# Patient Record
Sex: Female | Born: 1935 | Race: White | Hispanic: No | Smoking: Never smoker
Health system: Southern US, Community
[De-identification: ages and names within clinical notes are randomized; demographics above are authoritative.]

## PROBLEM LIST (undated history)

## (undated) DIAGNOSIS — L570 Actinic keratosis: Secondary | ICD-10-CM

## (undated) DIAGNOSIS — F419 Anxiety disorder, unspecified: Secondary | ICD-10-CM

## (undated) DIAGNOSIS — I471 Supraventricular tachycardia, unspecified: Secondary | ICD-10-CM

## (undated) DIAGNOSIS — E785 Hyperlipidemia, unspecified: Secondary | ICD-10-CM

## (undated) DIAGNOSIS — R7611 Nonspecific reaction to tuberculin skin test without active tuberculosis: Secondary | ICD-10-CM

## (undated) DIAGNOSIS — I10 Essential (primary) hypertension: Secondary | ICD-10-CM

## (undated) DIAGNOSIS — S060XAA Concussion with loss of consciousness status unknown, initial encounter: Secondary | ICD-10-CM

## (undated) DIAGNOSIS — M179 Osteoarthritis of knee, unspecified: Secondary | ICD-10-CM

## (undated) DIAGNOSIS — R002 Palpitations: Secondary | ICD-10-CM

## (undated) DIAGNOSIS — B029 Zoster without complications: Secondary | ICD-10-CM

## (undated) DIAGNOSIS — F32A Depression, unspecified: Secondary | ICD-10-CM

## (undated) DIAGNOSIS — M8580 Other specified disorders of bone density and structure, unspecified site: Secondary | ICD-10-CM

## (undated) DIAGNOSIS — I639 Cerebral infarction, unspecified: Secondary | ICD-10-CM

## (undated) DIAGNOSIS — T7840XA Allergy, unspecified, initial encounter: Secondary | ICD-10-CM

## (undated) DIAGNOSIS — F329 Major depressive disorder, single episode, unspecified: Secondary | ICD-10-CM

## (undated) DIAGNOSIS — K219 Gastro-esophageal reflux disease without esophagitis: Secondary | ICD-10-CM

## (undated) DIAGNOSIS — S060X9A Concussion with loss of consciousness of unspecified duration, initial encounter: Secondary | ICD-10-CM

## (undated) DIAGNOSIS — M1710 Unilateral primary osteoarthritis, unspecified knee: Secondary | ICD-10-CM

## (undated) DIAGNOSIS — K589 Irritable bowel syndrome without diarrhea: Secondary | ICD-10-CM

## (undated) DIAGNOSIS — I499 Cardiac arrhythmia, unspecified: Secondary | ICD-10-CM

## (undated) DIAGNOSIS — J45909 Unspecified asthma, uncomplicated: Secondary | ICD-10-CM

## (undated) HISTORY — PX: CATARACT EXTRACTION: SUR2

## (undated) HISTORY — PX: HX HYSTERECTOMY: SHX81

## (undated) HISTORY — PX: HX GALL BLADDER SURGERY/CHOLE: SHX55

## (undated) HISTORY — DX: Actinic keratosis: L57.0

## (undated) HISTORY — DX: Unilateral primary osteoarthritis, unspecified knee: M17.10

## (undated) HISTORY — DX: Allergy, unspecified, initial encounter: T78.40XA

## (undated) HISTORY — DX: Concussion with loss of consciousness of unspecified duration, initial encounter: S06.0X9A

## (undated) HISTORY — DX: Osteoarthritis of knee, unspecified: M17.9

## (undated) HISTORY — DX: Palpitations: R00.2

## (undated) HISTORY — DX: Major depressive disorder, single episode, unspecified: F32.9

## (undated) HISTORY — DX: Irritable bowel syndrome, unspecified: K58.9

## (undated) HISTORY — PX: TOTAL ABDOMINAL HYSTERECTOMY: SHX209

## (undated) HISTORY — DX: Zoster without complications: B02.9

## (undated) HISTORY — DX: Supraventricular tachycardia: I47.1

## (undated) HISTORY — DX: Essential (primary) hypertension: I10

## (undated) HISTORY — DX: Hyperlipidemia, unspecified: E78.5

## (undated) HISTORY — DX: Supraventricular tachycardia, unspecified: I47.10

## (undated) HISTORY — DX: Nonspecific reaction to tuberculin skin test without active tuberculosis: R76.11

## (undated) HISTORY — DX: Anxiety disorder, unspecified: F41.9

## (undated) HISTORY — DX: Concussion with loss of consciousness status unknown, initial encounter: S06.0XAA

## (undated) HISTORY — DX: Depression, unspecified: F32.A

## (undated) HISTORY — PX: OTHER SURGICAL HISTORY: SHX169

## (undated) HISTORY — DX: Other specified disorders of bone density and structure, unspecified site: M85.80

## (undated) HISTORY — DX: Gastro-esophageal reflux disease without esophagitis: K21.9

---

## 1986-12-17 HISTORY — PX: BREAST EXCISIONAL BIOPSY: SUR124

## 1994-11-19 ENCOUNTER — Ambulatory Visit (HOSPITAL_COMMUNITY): Payer: Self-pay | Admitting: INTERNAL MEDICINE

## 1995-12-18 HISTORY — PX: GALLBLADDER SURGERY: SHX652

## 1996-04-04 ENCOUNTER — Other Ambulatory Visit: Payer: Self-pay

## 2002-12-17 HISTORY — PX: RECTOCELE REPAIR: SHX761

## 2003-11-03 ENCOUNTER — Other Ambulatory Visit: Payer: Self-pay

## 2007-07-01 ENCOUNTER — Ambulatory Visit: Payer: Self-pay | Admitting: Anesthesiology

## 2007-07-23 ENCOUNTER — Ambulatory Visit: Payer: Self-pay | Admitting: Pain Medicine

## 2007-07-29 ENCOUNTER — Ambulatory Visit: Payer: Self-pay | Admitting: Pain Medicine

## 2007-08-13 ENCOUNTER — Ambulatory Visit: Payer: Self-pay | Admitting: Pain Medicine

## 2007-08-14 ENCOUNTER — Ambulatory Visit: Payer: Self-pay | Admitting: Pain Medicine

## 2007-08-28 ENCOUNTER — Ambulatory Visit: Payer: Self-pay | Admitting: Pain Medicine

## 2007-09-24 ENCOUNTER — Ambulatory Visit: Payer: Self-pay | Admitting: Physician Assistant

## 2007-11-05 ENCOUNTER — Encounter: Payer: Self-pay | Admitting: Family Medicine

## 2008-01-18 HISTORY — PX: CARDIAC CATHETERIZATION: SHX172

## 2008-01-29 ENCOUNTER — Ambulatory Visit: Payer: Self-pay | Admitting: Cardiovascular Disease

## 2008-04-20 ENCOUNTER — Other Ambulatory Visit: Payer: Self-pay

## 2008-04-22 ENCOUNTER — Ambulatory Visit: Payer: Self-pay | Admitting: Physician Assistant

## 2008-05-19 ENCOUNTER — Ambulatory Visit: Payer: Self-pay | Admitting: Physician Assistant

## 2008-09-16 ENCOUNTER — Ambulatory Visit: Payer: Self-pay | Admitting: Physician Assistant

## 2009-02-23 ENCOUNTER — Ambulatory Visit: Payer: Self-pay | Admitting: Family Medicine

## 2009-02-23 DIAGNOSIS — J45909 Unspecified asthma, uncomplicated: Secondary | ICD-10-CM | POA: Insufficient documentation

## 2009-02-23 DIAGNOSIS — F411 Generalized anxiety disorder: Secondary | ICD-10-CM | POA: Insufficient documentation

## 2009-02-23 DIAGNOSIS — I10 Essential (primary) hypertension: Secondary | ICD-10-CM | POA: Insufficient documentation

## 2009-02-23 DIAGNOSIS — J309 Allergic rhinitis, unspecified: Secondary | ICD-10-CM | POA: Insufficient documentation

## 2009-02-23 DIAGNOSIS — E785 Hyperlipidemia, unspecified: Secondary | ICD-10-CM | POA: Insufficient documentation

## 2009-02-23 DIAGNOSIS — B0229 Other postherpetic nervous system involvement: Secondary | ICD-10-CM | POA: Insufficient documentation

## 2009-02-23 DIAGNOSIS — K219 Gastro-esophageal reflux disease without esophagitis: Secondary | ICD-10-CM | POA: Insufficient documentation

## 2009-03-16 ENCOUNTER — Ambulatory Visit: Payer: Self-pay | Admitting: Family Medicine

## 2009-03-16 DIAGNOSIS — F32A Depression, unspecified: Secondary | ICD-10-CM | POA: Insufficient documentation

## 2009-03-16 DIAGNOSIS — F329 Major depressive disorder, single episode, unspecified: Secondary | ICD-10-CM

## 2009-04-19 ENCOUNTER — Telehealth: Payer: Self-pay | Admitting: Family Medicine

## 2009-04-25 ENCOUNTER — Telehealth: Payer: Self-pay | Admitting: Family Medicine

## 2009-05-13 ENCOUNTER — Telehealth: Payer: Self-pay | Admitting: Family Medicine

## 2009-05-19 ENCOUNTER — Encounter (INDEPENDENT_AMBULATORY_CARE_PROVIDER_SITE_OTHER): Payer: Self-pay | Admitting: *Deleted

## 2009-05-23 ENCOUNTER — Telehealth: Payer: Self-pay | Admitting: Family Medicine

## 2009-05-26 ENCOUNTER — Ambulatory Visit: Payer: Self-pay | Admitting: Family Medicine

## 2009-05-27 ENCOUNTER — Encounter: Payer: Self-pay | Admitting: Family Medicine

## 2009-05-29 LAB — CONVERTED CEMR LAB
ALT: 19 units/L (ref 0–35)
AST: 27 units/L (ref 0–37)
Albumin: 3.9 g/dL (ref 3.5–5.2)
BUN: 15 mg/dL (ref 6–23)
Basophils Absolute: 0 10*3/uL (ref 0.0–0.1)
Basophils Relative: 0.1 % (ref 0.0–3.0)
CO2: 32 meq/L (ref 19–32)
Calcium: 9.5 mg/dL (ref 8.4–10.5)
Chloride: 107 meq/L (ref 96–112)
Cholesterol: 247 mg/dL — ABNORMAL HIGH (ref 0–200)
Creatinine, Ser: 0.9 mg/dL (ref 0.4–1.2)
Direct LDL: 172.1 mg/dL
Eosinophils Absolute: 0.2 10*3/uL (ref 0.0–0.7)
Eosinophils Relative: 3.3 % (ref 0.0–5.0)
Glucose, Bld: 92 mg/dL (ref 70–99)
HCT: 42.1 % (ref 36.0–46.0)
HDL: 63.3 mg/dL (ref >39.00)
Hemoglobin: 14.4 g/dL (ref 12.0–15.0)
Lymphocytes Relative: 43.9 % (ref 12.0–46.0)
Lymphs Abs: 1.9 10*3/uL (ref 0.7–4.0)
MCHC: 34.2 g/dL (ref 30.0–36.0)
MCV: 90.9 fL (ref 78.0–100.0)
Monocytes Absolute: 0.6 10*3/uL (ref 0.1–1.0)
Monocytes Relative: 12.3 % — ABNORMAL HIGH (ref 3.0–12.0)
Neutro Abs: 1.9 10*3/uL (ref 1.4–7.7)
Neutrophils Relative %: 40.4 % — ABNORMAL LOW (ref 43.0–77.0)
Phosphorus: 3.6 mg/dL (ref 2.3–4.6)
Platelets: 237 10*3/uL (ref 150.0–400.0)
Potassium: 4.6 meq/L (ref 3.5–5.1)
RBC: 4.63 M/uL (ref 3.87–5.11)
RDW: 12.1 % (ref 11.5–14.6)
Sodium: 144 meq/L (ref 135–145)
TSH: 2.98 microintl units/mL (ref 0.35–5.50)
Total CHOL/HDL Ratio: 4
Triglycerides: 106 mg/dL (ref 0.0–149.0)
VLDL: 21.2 mg/dL (ref 0.0–40.0)
WBC: 4.6 10*3/uL (ref 4.5–10.5)

## 2009-05-30 ENCOUNTER — Ambulatory Visit: Payer: Self-pay | Admitting: Family Medicine

## 2009-06-01 LAB — CONVERTED CEMR LAB: Vit D, 25-Hydroxy: 47 ng/mL (ref 30–89)

## 2009-07-07 ENCOUNTER — Ambulatory Visit: Payer: Self-pay | Admitting: Family Medicine

## 2009-07-07 ENCOUNTER — Telehealth: Payer: Self-pay | Admitting: Family Medicine

## 2009-07-11 LAB — CONVERTED CEMR LAB
ALT: 16 units/L (ref 0–35)
AST: 24 units/L (ref 0–37)
Cholesterol: 169 mg/dL (ref 0–200)
HDL: 57.6 mg/dL (ref >39.00)
LDL Cholesterol: 81 mg/dL (ref 0–99)
Total CHOL/HDL Ratio: 3
Triglycerides: 150 mg/dL — ABNORMAL HIGH (ref 0.0–149.0)
VLDL: 30 mg/dL (ref 0.0–40.0)

## 2009-08-04 ENCOUNTER — Telehealth: Payer: Self-pay | Admitting: Family Medicine

## 2009-08-10 ENCOUNTER — Telehealth: Payer: Self-pay | Admitting: Family Medicine

## 2009-09-12 ENCOUNTER — Telehealth: Payer: Self-pay | Admitting: Family Medicine

## 2009-09-14 ENCOUNTER — Telehealth: Payer: Self-pay | Admitting: Family Medicine

## 2009-09-16 ENCOUNTER — Telehealth: Payer: Self-pay | Admitting: Family Medicine

## 2009-09-20 ENCOUNTER — Encounter: Payer: Self-pay | Admitting: Family Medicine

## 2009-09-20 ENCOUNTER — Telehealth: Payer: Self-pay | Admitting: Family Medicine

## 2009-09-26 ENCOUNTER — Telehealth: Payer: Self-pay | Admitting: Family Medicine

## 2009-09-27 ENCOUNTER — Encounter: Payer: Self-pay | Admitting: Family Medicine

## 2009-10-03 ENCOUNTER — Ambulatory Visit: Payer: Self-pay | Admitting: Family Medicine

## 2009-10-04 LAB — CONVERTED CEMR LAB
ALT: 15 units/L (ref 0–35)
AST: 19 units/L (ref 0–37)
Albumin: 3.9 g/dL (ref 3.5–5.2)
BUN: 21 mg/dL (ref 6–23)
CO2: 32 meq/L (ref 19–32)
Calcium: 9.1 mg/dL (ref 8.4–10.5)
Chloride: 101 meq/L (ref 96–112)
Cholesterol: 281 mg/dL — ABNORMAL HIGH (ref 0–200)
Creatinine, Ser: 0.9 mg/dL (ref 0.4–1.2)
Direct LDL: 203.9 mg/dL
GFR calc non Af Amer: 65.26 mL/min (ref >60)
Glucose, Bld: 87 mg/dL (ref 70–99)
HDL: 60.7 mg/dL (ref >39.00)
Phosphorus: 3.6 mg/dL (ref 2.3–4.6)
Potassium: 4.2 meq/L (ref 3.5–5.1)
Sodium: 142 meq/L (ref 135–145)
Total CHOL/HDL Ratio: 5
Triglycerides: 142 mg/dL (ref 0.0–149.0)
VLDL: 28.4 mg/dL (ref 0.0–40.0)

## 2009-10-20 ENCOUNTER — Telehealth: Payer: Self-pay | Admitting: Family Medicine

## 2009-11-22 ENCOUNTER — Telehealth: Payer: Self-pay | Admitting: Family Medicine

## 2009-11-30 ENCOUNTER — Ambulatory Visit: Payer: Self-pay | Admitting: Family Medicine

## 2009-12-21 ENCOUNTER — Telehealth: Payer: Self-pay | Admitting: Family Medicine

## 2009-12-22 ENCOUNTER — Encounter: Payer: Self-pay | Admitting: Family Medicine

## 2010-01-05 ENCOUNTER — Ambulatory Visit: Payer: Self-pay | Admitting: Family Medicine

## 2010-01-05 ENCOUNTER — Encounter (INDEPENDENT_AMBULATORY_CARE_PROVIDER_SITE_OTHER): Payer: Self-pay | Admitting: *Deleted

## 2010-01-06 LAB — CONVERTED CEMR LAB
BUN: 11 mg/dL (ref 6–23)
Basophils Absolute: 0 10*3/uL (ref 0.0–0.1)
Basophils Relative: 0.2 % (ref 0.0–3.0)
CO2: 29 meq/L (ref 19–32)
Calcium: 9.4 mg/dL (ref 8.4–10.5)
Chloride: 105 meq/L (ref 96–112)
Cholesterol: 181 mg/dL (ref 0–200)
Creatinine, Ser: 1 mg/dL (ref 0.4–1.2)
Eosinophils Absolute: 0.1 10*3/uL (ref 0.0–0.7)
Eosinophils Relative: 3.3 % (ref 0.0–5.0)
GFR calc non Af Amer: 57.75 mL/min (ref >60)
Glucose, Bld: 89 mg/dL (ref 70–99)
HCT: 44.5 % (ref 36.0–46.0)
HDL: 58.5 mg/dL (ref >39.00)
Hemoglobin: 14.5 g/dL (ref 12.0–15.0)
LDL Cholesterol: 107 mg/dL — ABNORMAL HIGH (ref 0–99)
Lymphocytes Relative: 37.5 % (ref 12.0–46.0)
Lymphs Abs: 1.6 10*3/uL (ref 0.7–4.0)
MCHC: 32.5 g/dL (ref 30.0–36.0)
MCV: 93 fL (ref 78.0–100.0)
Monocytes Absolute: 0.5 10*3/uL (ref 0.1–1.0)
Monocytes Relative: 11.9 % (ref 3.0–12.0)
Neutro Abs: 2 10*3/uL (ref 1.4–7.7)
Neutrophils Relative %: 47.1 % (ref 43.0–77.0)
Platelets: 225 10*3/uL (ref 150.0–400.0)
Potassium: 4.4 meq/L (ref 3.5–5.1)
RBC: 4.79 M/uL (ref 3.87–5.11)
RDW: 12.6 % (ref 11.5–14.6)
Sodium: 141 meq/L (ref 135–145)
Total CHOL/HDL Ratio: 3
Triglycerides: 76 mg/dL (ref 0.0–149.0)
VLDL: 15.2 mg/dL (ref 0.0–40.0)
WBC: 4.2 10*3/uL — ABNORMAL LOW (ref 4.5–10.5)

## 2010-01-23 ENCOUNTER — Telehealth: Payer: Self-pay | Admitting: Family Medicine

## 2010-02-03 ENCOUNTER — Encounter: Payer: Self-pay | Admitting: Family Medicine

## 2010-02-21 ENCOUNTER — Telehealth: Payer: Self-pay | Admitting: Family Medicine

## 2010-03-02 ENCOUNTER — Encounter: Payer: Self-pay | Admitting: Family Medicine

## 2010-03-02 ENCOUNTER — Ambulatory Visit: Payer: Self-pay | Admitting: Gastroenterology

## 2010-03-17 ENCOUNTER — Telehealth: Payer: Self-pay | Admitting: Family Medicine

## 2010-03-23 ENCOUNTER — Telehealth: Payer: Self-pay | Admitting: Family Medicine

## 2010-04-14 ENCOUNTER — Telehealth: Payer: Self-pay | Admitting: Family Medicine

## 2010-04-17 ENCOUNTER — Encounter: Payer: Self-pay | Admitting: Family Medicine

## 2010-04-18 ENCOUNTER — Ambulatory Visit: Payer: Self-pay | Admitting: Family Medicine

## 2010-04-18 DIAGNOSIS — R002 Palpitations: Secondary | ICD-10-CM | POA: Insufficient documentation

## 2010-04-24 LAB — CONVERTED CEMR LAB
ALT: 15 units/L (ref 0–35)
AST: 23 units/L (ref 0–37)
Albumin: 4 g/dL (ref 3.5–5.2)
BUN: 15 mg/dL (ref 6–23)
Basophils Absolute: 0.1 10*3/uL (ref 0.0–0.1)
Basophils Relative: 1 % (ref 0.0–3.0)
CO2: 31 meq/L (ref 19–32)
Calcium: 9.6 mg/dL (ref 8.4–10.5)
Chloride: 103 meq/L (ref 96–112)
Cholesterol: 275 mg/dL — ABNORMAL HIGH (ref 0–200)
Creatinine, Ser: 0.8 mg/dL (ref 0.4–1.2)
Direct LDL: 198.6 mg/dL
Eosinophils Absolute: 0.2 10*3/uL (ref 0.0–0.7)
Eosinophils Relative: 4.2 % (ref 0.0–5.0)
GFR calc non Af Amer: 74.65 mL/min (ref >60)
Glucose, Bld: 94 mg/dL (ref 70–99)
HCT: 41.6 % (ref 36.0–46.0)
HDL: 62.1 mg/dL (ref >39.00)
Hemoglobin: 14.1 g/dL (ref 12.0–15.0)
Lymphocytes Relative: 34.6 % (ref 12.0–46.0)
Lymphs Abs: 1.9 10*3/uL (ref 0.7–4.0)
MCHC: 33.8 g/dL (ref 30.0–36.0)
MCV: 91 fL (ref 78.0–100.0)
Monocytes Absolute: 0.5 10*3/uL (ref 0.1–1.0)
Monocytes Relative: 9 % (ref 3.0–12.0)
Neutro Abs: 2.8 10*3/uL (ref 1.4–7.7)
Neutrophils Relative %: 51.2 % (ref 43.0–77.0)
Phosphorus: 4.1 mg/dL (ref 2.3–4.6)
Platelets: 248 10*3/uL (ref 150.0–400.0)
Potassium: 4.6 meq/L (ref 3.5–5.1)
RBC: 4.58 M/uL (ref 3.87–5.11)
RDW: 13.2 % (ref 11.5–14.6)
Sodium: 141 meq/L (ref 135–145)
TSH: 1.95 microintl units/mL (ref 0.35–5.50)
Total CHOL/HDL Ratio: 4
Triglycerides: 102 mg/dL (ref 0.0–149.0)
VLDL: 20.4 mg/dL (ref 0.0–40.0)
WBC: 5.4 10*3/uL (ref 4.5–10.5)

## 2010-05-08 ENCOUNTER — Ambulatory Visit: Payer: Self-pay | Admitting: Family Medicine

## 2010-05-10 ENCOUNTER — Encounter: Payer: Self-pay | Admitting: Family Medicine

## 2010-05-11 LAB — CONVERTED CEMR LAB
BUN: 18 mg/dL (ref 6–23)
Basophils Absolute: 0.1 10*3/uL (ref 0.0–0.1)
Basophils Relative: 0.9 % (ref 0.0–3.0)
CO2: 29 meq/L (ref 19–32)
Calcium: 9 mg/dL (ref 8.4–10.5)
Chloride: 105 meq/L (ref 96–112)
Creatinine, Ser: 0.9 mg/dL (ref 0.4–1.2)
Eosinophils Absolute: 0.3 10*3/uL (ref 0.0–0.7)
Eosinophils Relative: 5.1 % — ABNORMAL HIGH (ref 0.0–5.0)
GFR calc non Af Amer: 64.33 mL/min (ref >60)
Glucose, Bld: 90 mg/dL (ref 70–99)
HCT: 41.4 % (ref 36.0–46.0)
Hemoglobin: 14 g/dL (ref 12.0–15.0)
Lymphocytes Relative: 38.8 % (ref 12.0–46.0)
Lymphs Abs: 2.3 10*3/uL (ref 0.7–4.0)
MCHC: 33.8 g/dL (ref 30.0–36.0)
MCV: 91.2 fL (ref 78.0–100.0)
Monocytes Absolute: 0.6 10*3/uL (ref 0.1–1.0)
Monocytes Relative: 10.7 % (ref 3.0–12.0)
Neutro Abs: 2.6 10*3/uL (ref 1.4–7.7)
Neutrophils Relative %: 44.5 % (ref 43.0–77.0)
Platelets: 241 10*3/uL (ref 150.0–400.0)
Potassium: 4.3 meq/L (ref 3.5–5.1)
RBC: 4.54 M/uL (ref 3.87–5.11)
RDW: 13.2 % (ref 11.5–14.6)
Sodium: 141 meq/L (ref 135–145)
WBC: 5.9 10*3/uL (ref 4.5–10.5)

## 2010-06-21 ENCOUNTER — Telehealth: Payer: Self-pay | Admitting: Family Medicine

## 2010-07-19 ENCOUNTER — Telehealth: Payer: Self-pay | Admitting: Family Medicine

## 2010-08-03 ENCOUNTER — Ambulatory Visit: Payer: Self-pay | Admitting: Family Medicine

## 2010-08-14 ENCOUNTER — Telehealth: Payer: Self-pay | Admitting: Family Medicine

## 2010-08-28 ENCOUNTER — Ambulatory Visit: Payer: Self-pay | Admitting: Family Medicine

## 2010-09-04 ENCOUNTER — Encounter: Payer: Self-pay | Admitting: Family Medicine

## 2010-09-06 ENCOUNTER — Ambulatory Visit: Payer: Self-pay | Admitting: Family Medicine

## 2010-09-06 DIAGNOSIS — M810 Age-related osteoporosis without current pathological fracture: Secondary | ICD-10-CM | POA: Insufficient documentation

## 2010-09-06 LAB — CONVERTED CEMR LAB
Blood in Urine, dipstick: NEGATIVE
Glucose, Urine, Semiquant: NEGATIVE
Ketones, urine, test strip: NEGATIVE
Urine crystals, microscopic: 0 /hpf
Urobilinogen, UA: 0.2
pH: 6

## 2010-09-07 ENCOUNTER — Encounter: Payer: Self-pay | Admitting: Family Medicine

## 2010-09-11 ENCOUNTER — Ambulatory Visit: Payer: Self-pay | Admitting: Family Medicine

## 2010-09-28 ENCOUNTER — Telehealth (INDEPENDENT_AMBULATORY_CARE_PROVIDER_SITE_OTHER): Payer: Self-pay | Admitting: *Deleted

## 2010-09-29 ENCOUNTER — Ambulatory Visit: Payer: Self-pay | Admitting: Family Medicine

## 2010-10-13 ENCOUNTER — Telehealth: Payer: Self-pay | Admitting: Family Medicine

## 2010-10-20 ENCOUNTER — Ambulatory Visit: Payer: Self-pay | Admitting: Family Medicine

## 2010-10-20 DIAGNOSIS — J069 Acute upper respiratory infection, unspecified: Secondary | ICD-10-CM | POA: Insufficient documentation

## 2010-11-14 ENCOUNTER — Ambulatory Visit: Payer: Self-pay | Admitting: Family Medicine

## 2010-11-14 ENCOUNTER — Encounter: Payer: Self-pay | Admitting: Family Medicine

## 2010-11-16 ENCOUNTER — Encounter: Payer: Self-pay | Admitting: Family Medicine

## 2010-12-13 ENCOUNTER — Telehealth: Payer: Self-pay | Admitting: Family Medicine

## 2011-01-09 ENCOUNTER — Ambulatory Visit
Admission: RE | Admit: 2011-01-09 | Discharge: 2011-01-09 | Payer: Self-pay | Source: Home / Self Care | Attending: Family Medicine | Admitting: Family Medicine

## 2011-01-09 DIAGNOSIS — G5600 Carpal tunnel syndrome, unspecified upper limb: Secondary | ICD-10-CM | POA: Insufficient documentation

## 2011-01-09 DIAGNOSIS — L989 Disorder of the skin and subcutaneous tissue, unspecified: Secondary | ICD-10-CM | POA: Insufficient documentation

## 2011-01-16 NOTE — Assessment & Plan Note (Signed)
Summary: L SHOULDER PAIN/CLE   Vital Signs:  Patient profile:   77 year old female Height:      62 inches Weight:      135.0 pounds BMI:     24.78 Temp:     98.3 degrees F oral Pulse rate:   80 / minute Pulse rhythm:   regular BP sitting:   120 / 70  (left arm) Cuff size:   regular  Vitals Entered By: Benny Lennert CMA Duncan Dull) (September 11, 2010 12:38 PM)  History of Present Illness: Chief complaint Left shoulder pain  let shoulder, followup, probable partial thickness cuff injury on the left. She continues to have some significant amount of pain, some pain with an arc of motion, but her range of motion though minimally restricted is been relatively preserved. She has been doing her home exercise program and range of motion, does cause some pain. Still has a dull aching pain on the lateral side of her shoulder.  Additionally, she brings up some chronic intermittent sciatica on the right side. Pain radiating down the posterior aspect of her right leg. No trauma or accident in this regard. No lateral hip intertrochanteric pain. Buttocks pain.  08/03/2010 OV 77 year old female:  pleasant patient who was reaching around with her left shoulder behind her approximately 2 weeks or so ago, and suddenly felt a significant amount of pain.  Since that time she has had pain in abduction and internal rotation.  She does have a history of some shoulder injury approximately 20 years ago or more ago, however she never sought medical attention, and so unclear of exact diagnosis. No known history of traumatic fracture operative intervention.  She also brings up a history of some median nerve distribution paresthesias.  Review of systems as above. Able to eat drink, no nauseous or vomits  Allergies: 1)  ! Augmentin 2)  ! * Tape 3)  ! Lipitor (Atorvastatin) 4)  ! Neurontin 5)  ! * Albuterol 6)  ! Crestor 7)  ! Vytorin 8)  ! Fosamax 9)  ! Actonel 10)  ! Boniva 11)  ! * Diltiazem 12)  !  Sulfa  Past History:  Past medical, surgical, family and social histories (including risk factors) reviewed, and no changes noted (except as noted below).  Past Medical History: Reviewed history from 09/06/2010 and no changes required. 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 Gallbladder 1997 Breast Biopsy 1988 Hysterectomy sept 1995 (for bleeding , not cancer ) Rectocele 2004 colonosc 1/06- was nl , no polyps  EGD- in past , nl  cardiac work up with cath (mild CAD) dexa 11/08 OP colonosc 3/11 nl -- re check 5 y  cardiologist -- [place]   Family History: Reviewed history from 08/28/2010 and no changes required. Family History of Arthritis (parent, grandparent, other relative) Family History Breast cancer 1st degree relative <50 (mother, other blood relative) Family History Hypertension(parent, grandparent, other relative) Family History of Heart disease(parent, grandparent, other relative) Family History of Colon CA (other relative)- sister  Family History of Stroke (other relative)  daughter with thyroid ca, and ? lymphoma   daughter  is Doneen Poisson - pt here   sister colon ca  daughter with anxiety  Social History: Reviewed history from 05/30/2009 and no changes required. Retired- homemaker  Married- cares for husband who has heart dz  Never Smoked Alcohol use-no Drug use-no Regular exercise-no G3P3 moved here from [facility] - 2 years ago to be closer to family  Review of Systems       REVIEW OF SYSTEMS  GEN: No systemic complaints, no  fevers, chills, sweats, or other acute illnesses MSK: Detailed in the HPI GI: tolerating PO intake without difficulty Neuro: as above Otherwise the pertinent positives of the ROS are noted above.    Physical Exam  General:  GEN: Well-developed,well-nourished,in no acute distress; alert,appropriate and cooperative throughout examination HEENT: Normocephalic and atraumatic without obvious abnormalities. No apparent alopecia or balding. Ears, externally no deformities PULM: Breathing comfortably in no respiratory distress EXT: No clubbing, cyanosis, or edema PSYCH: Normally interactive. Cooperative during the interview. Pleasant. Friendly and conversant. Not anxious or depressed appearing. Normal, full affect.  Msk:  Shoulder: L Inspection: No muscle wasting or winging Ecchymosis/edema: neg  AC joint, scapula, clavicle: NT Cervical spine: NT, full ROM Spurling's: neg Abduction: full, 4+/5, painful arc of motion. Flexion: full, 5/5 IR, full, lift-off: 5/5 ER at neutral: full, 5/5 AC crossover: neg Neer: pos Hawkins: pos Drop Test: negcauses some pain, but able to achieve a full control easily on downward arc Empty Can: pos Supraspinatus insertion: mild-mod T Bicipital groove: NT Speed's: neg Yergason's: neg Sulcus sign: neg Scapular dyskinesis: some elevation in the scapula onabduction C5-T1 intact  Neuro: Sensation intact Grip 5/5  Additional Exam:   HIP EXAM: SIDE: R ROM: Abduction, Flexion, Internal and External range of motion: notably restricted with internal/external rotation on the right. Pain with terminal IROM and EROM: yes GTB: NT SLR: NEG Knees: No effusion FABER: NT REVERSE FABER: positive Piriformis: tender at direct palpation Str: flexion: 4-/5 abduction: 4-/5 on the right, 4+/5 on the left adduction: 5/5 Strength testing non-tender    Impression & Recommendations:  Problem # 1:  ROTATOR CUFF INJURY, LEFT SHOULDER (ICD-726.10) continued symptoms.  Would anticipate it was could take 3-5 months for this to heal and in conservative fashion. I don't think it's fully torn.  This point, given her pain, think it's appropriate to do a subacromial Kenalog injection.  SubAC Injection Verbal consent was obtained from the patient. Risks, benefits, and alternatives were explained. Patient prepped with Betadine and Ethyl Chloride used for anesthesia. The subacromial space was injected using the posterior approach. The patient tolerated the procedure well and had decreased pain post injection. No complications. Injection: 9 cc of Lidocaine 1% and 1cc of Kenalog 40 mg. Needle: 22 gauge   Orders: Physical Therapy Referral (PT) Joint Aspirate / Injection, Large (20610) Kenalog 10mg  (4units) (J3301)  Problem # 2:  SCIATICA, ACUTE (ICD-724.3) Assessment: New Exam and history consistent with sciatica  Start ROM protocols, direct massage of piriformis, HIP ROM, core stabily, pelvic stability PT  The following medications were removed from the medication list:    Tramadol Hcl 50 Mg Tabs (Tramadol hcl) .Marland Kitchen... 1 by mouth 4 times daily Her updated medication list for this problem includes:    Hydrocodone-acetaminophen 7.5-325 Mg Tabs (Hydrocodone-acetaminophen) ..... One by mouth every 6 hours as needed pain  Orders: Physical Therapy Referral (PT)  Complete Medication List: 1)  Caltrate 600+d Plus 600-400 Mg-unit Tabs (Calcium carbonate-vit d-min) .... Take 1 tablet by mouth two times a day 2)  Hydrocodone-acetaminophen 7.5-325 Mg Tabs (Hydrocodone-acetaminophen) .... One by mouth every 6 hours as needed pain 3)  Lorazepam 1 Mg Tabs (Lorazepam) .... Take 1 tablet by mouth once a day as needed 4)  Omeprazole 40 Mg Cpdr (Omeprazole) .... Take 1 tablet by mouth once a day 5)  Singulair 10 Mg Tabs (Montelukast sodium) .... Take 1 tablet by mouth once a day as needed 6)  Gas-x Extra Strength 125 Mg Caps (Simethicone) .... Otc as directed  Patient  Instructions: 1)  Referral Appointment Information 2)  Day/Date: 3)  Time: 4)  Place/MD: 5)  Address: 6)  Phone/Fax: 7)  Patient given appointment information. Information/Orders faxed/mailed.  8)  f/u 6 weeks  Current Allergies (reviewed today): ! AUGMENTIN ! * TAPE ! LIPITOR (ATORVASTATIN) ! NEURONTIN ! * ALBUTEROL ! CRESTOR ! VYTORIN ! FOSAMAX ! ACTONEL ! BONIVA ! * DILTIAZEM ! SULFA

## 2011-01-16 NOTE — Assessment & Plan Note (Signed)
Summary: ?UTI/CLE   Vital Signs:  Patient profile:   77 year old female Weight:      134 pounds Temp:     97.9 degrees F oral Pulse rate:   80 / minute Pulse rhythm:   irregular BP sitting:   150 / 100  (left arm) Cuff size:   regular  Vitals Entered By: Sydell Axon LPN (September 06, 2010 12:23 PM) CC: ? UTI, burning with urination, frequency and pressure   History of Present Illness: a lot of bladder pain and frequency of urination  also burns to urinate too drinking cranberrry and lots of water  no fever or nausea  some low back pain - both sides  recent dexa osteopenia  T score -1.6 in LS -- down a bit  T score -2.3 in FN - that is quite a bit improved   takes caltrate two times a day and stays active  non tol the bisphophenates  we reviewed the scan results in detail today    had a rectocele repair 6 years ago -- some degree of pain ever since   Allergies: 1)  ! Augmentin 2)  ! * Tape 3)  ! Lipitor (Atorvastatin) 4)  ! Neurontin 5)  ! * Albuterol 6)  ! Crestor 7)  ! Vytorin 8)  ! Fosamax 9)  ! Actonel 10)  ! Boniva 11)  ! * Diltiazem 12)  ! 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  Shingles- post herpetic neuralgia (pain clinic in past) CAD- mild  IBS  cardiol- ortho--[facility]  GI- Dr Bluford Kaufmann  Past Surgical History: Last updated: 03/07/2010 Gallbladder 1997 Breast Biopsy 1988 Hysterectomy sept 1995 (for bleeding , not cancer ) Rectocele 2004 colonosc 1/06- was nl , no polyps  EGD- in past , nl  cardiac work up with cath (mild CAD) dexa 11/08 OP colonosc 3/11 nl -- re check 5 y  cardiologist -- [place]   Family History: Last updated:  08/28/2010 Family History of Arthritis (parent, grandparent, other relative) Family History Breast cancer 1st degree relative <50 (mother, other blood relative) Family History Hypertension(parent, grandparent, other relative) Family History of Heart disease(parent, grandparent, other relative) Family History of Colon CA (other relative)- sister  Family History of Stroke (other relative)  daughter with thyroid ca, and ? lymphoma   daughter is Doneen Poisson - pt here   sister colon ca  daughter with anxiety  Social History: Last updated: 05/30/2009 Retired- homemaker  Married- cares for husband who has heart dz  Never Smoked Alcohol use-no Drug use-no Regular exercise-no G3P3 moved here from [place] - 2 years ago to be closer to family  Risk Factors: Exercise: no (02/23/2009)  Risk Factors: Smoking Status: never (02/23/2009)  Review of Systems General:  Denies chills, fatigue, and fever. Eyes:  Denies blurring. CV:  Denies chest pain or discomfort and palpitations. Resp:  Denies cough and shortness of breath. GI:  Denies abdominal pain, indigestion, and nausea. GU:  Complains of discharge and urinary frequency; denies hematuria. MS:  Denies joint pain. Derm:  Denies itching, lesion(s), poor wound healing, and rash. Neuro:  Denies headaches, numbness, and tingling. Endo:  Denies cold intolerance, excessive thirst, excessive urination, and heat intolerance. Heme:  Denies abnormal bruising and bleeding.  Physical Exam  General:  Well-developed,well-nourished,in no acute distress; alert,appropriate and cooperative throughout examination Head:  normocephalic, atraumatic, and no abnormalities observed.   Eyes:  vision grossly intact, pupils equal, pupils round, and pupils reactive to light.  no conjunctival pallor, injection or icterus  Mouth:  pharynx pink and moist.   Neck:  supple with full rom and no masses or thyromegally, no JVD or carotid bruit  Lungs:  Normal  respiratory effort, chest expands symmetrically. Lungs are clear to auscultation, no crackles or wheezes. Heart:  Normal rate and regular rhythm. S1 and S2 normal without gallop, murmur, click, rub or other extra sounds. Abdomen:  suprapubic tenderness without rebound or gaurding Msk:  no CVA tenderness  no kyphosis  Extremities:  No clubbing, cyanosis, edema, or deformity noted with normal full range of motion of all joints.   Skin:  Intact without suspicious lesions or rashes Cervical Nodes:  No lymphadenopathy noted Inguinal Nodes:  No significant adenopathy Psych:  normal affect, talkative and pleasant    Impression & Recommendations:  Problem # 1:  UTI (ICD-599.0) Assessment New quick onset but uncomplicated  tx with cipro ucx pending  pt advised to update me if symptoms worsen or do not improve  Her updated medication list for this problem includes:    Cipro 250 Mg Tabs (Ciprofloxacin hcl) .Marland Kitchen... 1 by mouth two times a day for 7 days  Orders: T-Culture, Urine ([address]) Specimen Handling ([address]) UA Dipstick w/Micro (automated) (81001)  Problem # 2:  OSTEOPENIA (ICD-733.90) Assessment: Improved  overall pt's bone density went from OP to osteopenia in femoral neck  rev ca and D intol bisphosphenates  if this goes back down again in future - may consider reclast or other med  re check 2 y safetly disc in detail   Her updated medication list for this problem includes:    Caltrate 600+d Plus 600-400 Mg-unit Tabs (Calcium carbonate-vit d-min) .Marland Kitchen... Take 1 tablet by mouth two times a day  Orders: UA Dipstick w/Micro (automated) (81001)  Complete Medication List: 1)  Caltrate 600+d Plus 600-400 Mg-unit Tabs (Calcium carbonate-vit d-min) .... Take 1 tablet by mouth two times a day 2)  Hydrocodone-acetaminophen 7.5-325 Mg Tabs (Hydrocodone-acetaminophen) .... One by mouth every 6 hours as needed pain 3)  Lorazepam 1 Mg Tabs (Lorazepam) .... Take 1 tablet by mouth once a  day as needed 4)  Omeprazole 40 Mg Cpdr (Omeprazole) .... Take 1 tablet by mouth once a day 5)  Singulair 10 Mg Tabs (Montelukast sodium) .... Take 1 tablet by mouth once a day as needed 6)  Gas-x Extra Strength 125 Mg Caps (Simethicone) .... Otc as directed 7)  Promethazine Hcl 25 Mg Tabs (Promethazine hcl) .Marland Kitchen.. 1 by mouth up to three times a day as needed nausea 8)  Sustenex Otc  .... Take one daily as needed 9)  Tramadol Hcl 50 Mg Tabs (Tramadol hcl) .Marland Kitchen.. 1 by mouth 4 times daily 10)  Buspirone Hcl 15 Mg Tabs (Buspirone hcl) .... 1/2 by mouth two times a day 11)  Cipro 250 Mg Tabs (Ciprofloxacin hcl) .Marland Kitchen.. 1 by mouth two times a day for 7 days  Patient Instructions: 1)  continue drinking lots of water 2)  call or seek care is symptoms don't improve in 2-3 days or if you develop back pain, nausea, or vomiting  3)  take the cipro as directed  4)  I will culture your urine and update when that returns  5)  the current recommendation for  calcium intake is 1200-1500 mg daily with 1000 IU of vitamin D  6)  dexa -- is a little improved at the hip / tiny bit worse at spine -- so overall better -- you went from osteoporosis to osteopenia  Prescriptions: CIPRO 250 MG TABS (CIPROFLOXACIN HCL) 1 by mouth two times a day for 7 days  #14 x 0   Entered and Authorized by:   Judith Part MD   Signed by:   Judith Part MD on 09/06/2010   Method used:   Electronically to        [facility] [address] (retail)       [address], [address]       [place], [place]  [address]       Ph: [phone]       Fax: [phone]   RxID:   [id number]   Current Allergies (reviewed today): ! AUGMENTIN ! * TAPE ! LIPITOR (ATORVASTATIN) ! NEURONTIN ! * ALBUTEROL ! CRESTOR ! VYTORIN ! FOSAMAX ! ACTONEL ! BONIVA ! * DILTIAZEM ! SULFA  Laboratory Results   Urine Tests  Date/Time Received: September 06, 2010 12:35 PM  Date/Time Reported: September 06, 2010 12:35  PM   Routine Urinalysis   Color: yellow Appearance: Cloudy Glucose: negative   (Normal Range: Negative) Bilirubin: negative   (Normal Range: Negative) Ketone: negative   (Normal Range: Negative) Spec. Gravity: 1.025   (Normal Range: 1.003-1.035) Blood: negative   (Normal Range: Negative) pH: 6.0   (Normal Range: 5.0-8.0) Protein: trace   (Normal Range: Negative) Urobilinogen: 0.2   (Normal Range: 0-1) Nitrite: negative   (Normal Range: Negative) Leukocyte Esterace: small   (Normal Range: Negative)  Urine Microscopic WBC/HPF: 2-3 RBC/HPF: 1-2 Bacteria/HPF: many Mucous/HPF: few Epithelial/HPF: 0-1 Crystals/HPF: 0 Casts/LPF: 0 Yeast/HPF: 0 Other: 0

## 2011-01-16 NOTE — Miscellaneous (Signed)
Summary: Controlled Substances Contract/[facility]  Controlled Substances Contract/[facility]   Imported By: Sherian Rein 09/01/2010 14:25:08  _____________________________________________________________________  External Attachment:    Type:   Image     Comment:   External Document

## 2011-01-16 NOTE — Progress Notes (Signed)
Summary: refill request for vicodin  Phone Note Refill Request Call back at Home Phone [phone] Message from:  Patient  Refills Requested: Medication #1:  HYDROCODONE-ACETAMINOPHEN 7.5-325 MG TABS one by mouth every 6 hours as needed pain Phoned request from pt,  please send to [facility] [address].  Initial call taken by: Lowella Petties CMA,  February 21, 2010 3:15 PM  Follow-up for Phone Call        px written on EMR for call in  Follow-up by: Judith Part MD,  February 21, 2010 4:39 PM  Additional Follow-up for Phone Call Additional follow up Details #1::        Patient notified as instructed by telephone. Medication phoned to [facility] [address] pharmacy as instructed. Lewanda Rife LPN  February 22, 980 4:48 PM     Prescriptions: HYDROCODONE-ACETAMINOPHEN 7.5-325 MG TABS (HYDROCODONE-ACETAMINOPHEN) one by mouth every 6 hours as needed pain  #60 x 0   Entered and Authorized by:   Judith Part MD   Signed by:   Lewanda Rife LPN on 19/14/7829   Method used:   Telephoned to ...         RxID:   [id number]

## 2011-01-16 NOTE — Progress Notes (Signed)
Summary: BP running low  Phone Note Call from Patient Call back at Home Phone [phone]   Caller: Patient Call For: Judith Part MD Summary of Call: Pt states her BP today is 97/59.  She says she feels a little light headed, thinks she is on too much BP medicine.  She says BP goes down at night, around low 100's/ 50's.  Please advise on what she should do. Initial call taken by: Lowella Petties CMA,  March 17, 2010 11:04 AM  Follow-up for Phone Call        please cut the diltiazem in 1/2  and just take 1/2 pill daily instead of a whole  please call and update me next week with how her bp are if no imp or other symptoms please f/u Follow-up by: Judith Part MD,  March 17, 2010 11:30 AM  Additional Follow-up for Phone Call Additional follow up Details #1::        Patient notified as instructed by telephone. Lewanda Rife LPN  March 17, 980 12:10 PM     New/Updated Medications: DILTIAZEM HCL 90 MG TABS (DILTIAZEM HCL) Take 1/2  tablet by mouth once a day

## 2011-01-16 NOTE — Assessment & Plan Note (Signed)
Summary: [facility] FLU SHOT/[facility]  Nurse Visit   Vitals Entered By: Benny Lennert CMA Duncan Dull) (September 29, 2010 11:05 AM)  Allergies: 1)  ! Augmentin 2)  ! * Tape 3)  ! Lipitor (Atorvastatin) 4)  ! Neurontin 5)  ! * Albuterol 6)  ! Crestor 7)  ! Vytorin 8)  ! Fosamax 9)  ! Actonel 10)  ! Boniva 11)  ! * Diltiazem 12)  ! Sulfa  Orders Added: 1)  Flu Vaccine 51yrs + MEDICARE PATIENTS [Q2039] 2)  Administration Flu vaccine - MCR [G0008]                        Flu Vaccine Consent Questions     Do you have a history of severe allergic reactions to this vaccine? no    Any prior history of allergic reactions to egg and/or gelatin? no    Do you have a sensitivity to the preservative Thimersol? no    Do you have a past history of Guillan-Barre Syndrome? no    Do you currently have an acute febrile illness? no    Have you ever had a severe reaction to latex? no    Vaccine information given and explained to patient? yes    Are you currently pregnant? no    Lot Number:[id number]   Exp Date:06/16/2011   Site Given  Left Deltoid IMlu

## 2011-01-16 NOTE — Procedures (Signed)
Summary: Colonoscopy by Dr[facility]Paul [facility]  Colonoscopy by Dr.Paul Oh,[facility]   Imported By: Beau Fanny 03/06/2010 16:39:48  _____________________________________________________________________  External Attachment:    Type:   Image     Comment:   External Document  Appended Document: Colonoscopy by Dr[facility]Paul [facility]    Clinical Lists Changes  Observations: Added new observation of PAST SURG HX: Gallbladder 1997 Breast Biopsy 1988 Hysterectomy sept 1995 (for bleeding , not cancer ) Rectocele 2004 colonosc 1/06- was nl , no polyps  EGD- in past , nl  cardiac work up with cath (mild CAD) dexa 11/08 OP colonosc 3/11 nl -- re check 5 y  cardiologist -- [place]  (03/07/2010 20:35) Added new observation of PAST MED HX: Allergic rhinitis Asthma Depression/anxiety  palpitaions- ativan helps (also tachycardia) GERD Hyperlipidemia Hypertension OA - end stage knees (injections) OP Glaucoma suspect  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  Shingles- post herpetic neuralgia (pain clinic in past) CAD- mild   cardiol- ortho--[facility]  GI- Dr Bluford Kaufmann (03/07/2010 20:35) Added new observation of COLONNXTDUE: 03/2015 (03/07/2010 20:35) Added new observation of COLONOSCOPY: normal (03/02/2010 20:35)       Preventive Care Screening  Colonoscopy:    Date:  03/02/2010    Next Due:  03/2015    Results:  normal   Past History:  Past Medical History: Allergic rhinitis Asthma Depression/anxiety  palpitaions- ativan helps (also tachycardia) GERD Hyperlipidemia Hypertension OA - end stage knees (injections) OP Glaucoma suspect  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

## 2011-01-16 NOTE — Progress Notes (Signed)
Summary: refill request for vicodin  Phone Note Refill Request Call back at Home Phone [phone] Message from:  Patient  Refills Requested: Medication #1:  HYDROCODONE-ACETAMINOPHEN 7.5-325 MG TABS one by mouth every 6 hours as needed pain Please send to [facility] [address].  Initial call taken by: Lowella Petties CMA,  March 23, 2010 8:37 AM  Follow-up for Phone Call        px written on EMR for call in  Follow-up by: Judith Part MD,  March 23, 2010 9:08 AM  Additional Follow-up for Phone Call Additional follow up Details #1::        Medication phoned to pharmacy.  Additional Follow-up by: Delilah Shan CMA (AAMA),  March 23, 2010 10:56 AM    Prescriptions: HYDROCODONE-ACETAMINOPHEN 7.5-325 MG TABS (HYDROCODONE-ACETAMINOPHEN) one by mouth every 6 hours as needed pain  #60 x 0   Entered and Authorized by:   Judith Part MD   Signed by:   Delilah Shan CMA (AAMA) on 03/23/2010   Method used:   Telephoned to ...         RxID:   [id number]

## 2011-01-16 NOTE — Progress Notes (Signed)
Summary: Lorazepam 1mg   Phone Note Refill Request Call back at [phone] Message from:  [facility] on August 14, 2010 11:24 AM  Refills Requested: Medication #1:  LORAZEPAM 1 MG TABS Take 1 tablet by mouth once a day as needed   Last Refilled: 07/19/2010 [facility] electronically request refill for Lorazepam 1mg . Last refilled 07/19/10.Please advise.    Method Requested: Telephone to Pharmacy Initial call taken by: Lewanda Rife LPN,  August 14, 2010 11:25 AM  Follow-up for Phone Call        px written on EMR for call in  Follow-up by: Judith Part MD,  August 14, 2010 11:33 AM  Additional Follow-up for Phone Call Additional follow up Details #1::        Medication phoned to [facility] as instructed. Lewanda Rife LPN  August 14, 2010 11:37 AM     New/Updated Medications: LORAZEPAM 1 MG TABS (LORAZEPAM) Take 1 tablet by mouth once a day as needed Prescriptions: LORAZEPAM 1 MG TABS (LORAZEPAM) Take 1 tablet by mouth once a day as needed  #30 x 5   Entered and Authorized by:   Judith Part MD   Signed by:   Lewanda Rife LPN on 82/95/6213   Method used:   Telephoned to ...         RxID:   [id number]

## 2011-01-16 NOTE — Assessment & Plan Note (Signed)
Summary: SINUS SYMPTOMS   Vital Signs:  Patient profile:   77 year old female Height:      62 inches Weight:      135.25 pounds BMI:     24.83 Temp:     97.9 degrees F oral Pulse rate:   76 / minute Pulse rhythm:   regular BP sitting:   166 / 88  (left arm) Cuff size:   regular  Vitals Entered By: Delilah Shan CMA Duncan Dull) (November 14, 2010 12:13 PM) CC: Sinus symptoms   History of Present Illness: Got over prev symptoms with amoxil.  Was doing better until Friday of last week.  Started with mucous in the throat and proceeded to have cough.  ST (some better now), rhinorrhea, and mild elevation in temp ( ~99).  Cough with sputum now, increase in the AM.  Hoarse.  H/o asthma, prev on singulair.  Some wheezing at nighttime.  Can't tolerate albuterol.    Allergies: 1)  ! Augmentin 2)  ! * Tape 3)  ! Lipitor (Atorvastatin) 4)  ! Neurontin 5)  ! * Albuterol 6)  ! Crestor 7)  ! Vytorin 8)  ! Fosamax 9)  ! Actonel 10)  ! Boniva 11)  ! * Diltiazem 12)  ! Sulfa  Review of Systems       See HPI.  Otherwise negative.    Physical Exam  General:  GEN: nad, alert and oriented HEENT: mucous membranes moist, TM w/o erythema, nasal epithelium injected, OP with cobblestoning, frontla dn max sinuses tender to palpation  NECK: supple w/o LA CV: rrr. PULM: ctab, no inc wob ABD: soft, +bs EXT: no edema    Impression & Recommendations:  Problem # 1:  URI (ICD-465.9) Her frontal and max sinuses are tender to palpation bilaterally.  No wheeze.  Will start amoxil, tolerates this.  Supportive tx in meantime.  Given that she typically has fall asthma symptoms and is intolerant of albuterol, will start flovent today.  She'll call back as needed.  Routine flovent discus teaching done and patient used correctly here in office.    Orders: Prescription Created Electronically [id number])  Complete Medication List: 1)  Caltrate 600+d Plus 600-400 Mg-unit Tabs (Calcium carbonate-vit d-min) ....  Take 1 tablet by mouth two times a day 2)  Hydrocodone-acetaminophen 7.5-325 Mg Tabs (Hydrocodone-acetaminophen) .... One by mouth every 6 hours as needed pain 3)  Lorazepam 1 Mg Tabs (Lorazepam) .... Take 1 tablet by mouth once a day as needed 4)  Omeprazole 40 Mg Cpdr (Omeprazole) .... Take 1 tablet by mouth once a day 5)  Gas-x Extra Strength 125 Mg Caps (Simethicone) .... Otc as directed 6)  Flovent Diskus 250 Mcg/blist Aepb (Fluticasone propionate (inhal)) .Marland Kitchen.. 1 puff two times a day 7)  Amoxicillin 875 Mg Tabs (Amoxicillin) .Marland Kitchen.. 1 by mouth two times a day  Patient Instructions: 1)  I would start the antibiotics today and try to get some rest.  Start taking flovent- 1 puff two times a day.  Rinse your mouth after use.  Let me know if you aren't improving.  It should help with the wheezing during the fall.  Prescriptions: AMOXICILLIN 875 MG TABS (AMOXICILLIN) 1 by mouth two times a day  #20 x 0   Entered and Authorized by:   Crawford Givens MD   Signed by:   Crawford Givens MD on 11/14/2010   Method used:   Electronically to        [facility] ([facility])  [address], [address]       [place], [place]  [address]       Ph: [phone]       Fax: [phone]   RxID:   [id number]    Orders Added: 1)  Est. Patient Level III [40102] 2)  Prescription Created Electronically [phone]    Current Allergies (reviewed today): ! AUGMENTIN ! * TAPE ! LIPITOR (ATORVASTATIN) ! NEURONTIN ! * ALBUTEROL ! CRESTOR ! VYTORIN ! FOSAMAX ! ACTONEL ! BONIVA ! * DILTIAZEM ! SULFA

## 2011-01-16 NOTE — Progress Notes (Signed)
Summary: wants hip x-ray  Phone Note Call from Patient Call back at Home Phone [phone]   Caller: Patient Summary of Call: Pt was referred for PT for right hip.  She is supposed to start this on 10/17 but she wants to have x-rays made first because her mother had bone cancer in her hip.  Please advise. Initial call taken by: Lowella Petties CMA,  September 28, 2010 10:42 AM  Follow-up for Phone Call        since Dr Patsy Lager saw her for sciatica -- I'm going to send this by him first to see what he thinks Follow-up by: Judith Part MD,  September 28, 2010 12:37 PM  Additional Follow-up for Phone Call Additional follow up Details #1::        i think that this patient is having sciatica, but i think it is reasonable to obtian a plain hip film. Hannah Beat MD  September 28, 2010 12:40 PM   New Problems: HIP PAIN [phone])   Additional Follow-up for Phone Call Additional follow up Details #2::    Ordered R hip xr and lumbar series  Terri, please set up xr appt time for pt. Hannah Beat MD  September 28, 2010 12:41 PM   Additional Follow-up for Phone Call Additional follow up Details #3:: Details for Additional Follow-up Action Taken: Patient is scheduled for 10.14.11, @ 10:30. Additional Follow-up by: Mills Koller,  September 29, 2010 9:27 AM  New Problems: HIP PAIN [phone])

## 2011-01-16 NOTE — Assessment & Plan Note (Signed)
Summary: diarrhea x 7 days/alc   Vital Signs:  Patient profile:   77 year old female Height:      62 inches Weight:      132.75 pounds BMI:     24.37 Temp:     97.5 degrees F oral Pulse rate:   84 / minute Pulse rhythm:   regular BP sitting:   132 / 84  (left arm) Cuff size:   regular  Vitals Entered By: Delilah Shan CMA Duncan Dull) (May 08, 2010 12:42 PM) CC: Diarrhea x 7 days   History of Present Illness: has had diarrhea - for 7 days  continuous  tried a brat diet - did not help at all  this has happened before - in january -- had for 7 days   no nausea  some crampy ( if this was after taking2 immodium) no pain at  all now no fever  feels generally weak - knows s/s of dehydration to look out for   started at trip to [facility] -- started tuesday  ? if possible food poisoning -- last ate toast / hamburger the day before  no one else got sick   some blood in stool - thinks this could just be from bowels moving a lot  a lot of mucous   at one time - took 2 immodium- did not help   has nl colonoscopy in march- no tics or other findings     Allergies: 1)  ! Augmentin 2)  ! * Tape 3)  ! Lipitor (Atorvastatin) 4)  ! Neurontin 5)  ! * Albuterol 6)  ! Crestor 7)  ! Vytorin 8)  ! Fosamax 9)  ! Actonel 10)  ! Boniva 11)  ! * Diltiazem 12)  ! 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  Shingles- post herpetic neuralgia (pain clinic in past) CAD- mild  IBS  cardiol- ortho--[facility]  GI- Dr Bluford Kaufmann  Past Surgical History: Last updated: 03/07/2010 Gallbladder 1997 Breast Biopsy 1988 Hysterectomy sept 1995 (for bleeding , not cancer ) Rectocele 2004 colonosc 1/06- was nl , no polyps    EGD- in past , nl  cardiac work up with cath (mild CAD) dexa 11/08 OP colonosc 3/11 nl -- re check 5 y  cardiologist -- [facility]   Family History: Last updated: 11/30/2009 Family History of Arthritis (parent, grandparent, other relative) Family History Breast cancer 1st degree relative <50 (mother, other blood relative) Family History Hypertension(parent, grandparent, other relative) Family History of Heart disease(parent, grandparent, other relative) Family History of Colon CA (other relative)- sister  Family History of Stroke (other relative)  daughter with thyroid ca, and ? lymphoma   daughter is Doneen Poisson - pt here   sister colon ca   Social History: Last updated: 05/30/2009 Retired- homemaker  Married- cares for husband who has heart dz  Never Smoked Alcohol use-no Drug use-no Regular exercise-no G3P3 moved here from [place] - 2 years ago to be closer to family  Risk Factors: Exercise: no (02/23/2009)  Risk Factors: Smoking Status: never (02/23/2009)  Review of Systems General:  Complains of weakness and weight loss; denies chills, fatigue, fever, and sweats. Eyes:  Denies blurring. CV:  Denies chest pain or discomfort and palpitations. Resp:  Denies cough and  wheezing. GI:  Complains of bloody stools, change in bowel habits, diarrhea, gas, and hemorrhoids; denies indigestion, loss of appetite, nausea, and vomiting; thinks blood in her stool is most likely from hemorroids or irritation. GU:  Denies dysuria, hematuria, and urinary frequency. MS:  Denies joint pain. Derm:  Denies itching, lesion(s), poor wound healing, and rash. Neuro:  Denies sensation of room spinning and tingling. Endo:  Denies cold intolerance and heat intolerance. Heme:  Denies abnormal bruising.  Physical Exam  General:  elderly female - slt fatigued appearing  Head:  normocephalic, atraumatic, and no abnormalities observed.   Eyes:  vision grossly intact, pupils equal, pupils  round, and pupils reactive to light.  no conjunctival pallor, injection or icterus  Mouth:  pharynx pink and moist.   Neck:  supple with full rom and no masses or thyromegally, no JVD or carotid bruit  Lungs:  Normal respiratory effort, chest expands symmetrically. Lungs are clear to auscultation, no crackles or wheezes. Heart:  Normal rate and regular rhythm. S1 and S2 normal without gallop, murmur, click, rub or other extra sounds. Abdomen:  mild tenderness bilat LQ - moreso on L no rebound or gaurding  slt hyperactive bs 4 Q- not high pitched or tinkling  no masses, no hepatomegaly, and no splenomegaly.   Extremities:  no CCE Neurologic:  sensation intact to light touch, gait normal, and DTRs symmetrical and normal.   Skin:  Intact without suspicious lesions or rashes nl color and turgor  brisk cap refil time   Cervical Nodes:  No lymphadenopathy noted Inguinal Nodes:  No significant adenopathy Psych:  normal affect, talkative and pleasant    Impression & Recommendations:  Problem # 1:  DIARRHEA (ICD-787.91) Assessment Deteriorated for 7 days - slt imp today  nl colonosc in march was traveling- in [place]- not camping our out of the country  stress is a little better  will do stool tests - cdiff (recent abx), stool cx, giardia  labs bmp and cbc today update if worse  can try small dose of immodium- nothing stronger talked in detail about avoiding dehydration/ and bland diet  Orders: Venipuncture ([address]) TLB-BMP (Basic Metabolic Panel-BMET) (80048-METABOL) TLB-CBC Platelet - w/Differential (85025-CBCD) T-Culture, Stool (87045/87046-70140) T-Culture, C-Diff Toxin A/B (32440-10272) T-Stool Giardia / Crypto- EIA (53664)  Complete Medication List: 1)  Caltrate 600+d Plus 600-400 Mg-unit Tabs (Calcium carbonate-vit d-min) .... Take 1 tablet by mouth two times a day 2)  Hydrocodone-acetaminophen 7.5-325 Mg Tabs (Hydrocodone-acetaminophen) .... One by mouth every 6 hours as needed  pain 3)  Lorazepam 1 Mg Tabs (Lorazepam) .... Take 1 tablet by mouth once a day as needed 4)  Omeprazole 40 Mg Cpdr (Omeprazole) .... Take 1 tablet by mouth once a day 5)  Singulair 10 Mg Tabs (Montelukast sodium) .... Take 1 tablet by mouth once a day 6)  Gas-x Extra Strength 125 Mg Caps (Simethicone) .... Otc as directed 7)  Zetia 10 Mg Tabs (Ezetimibe) .Marland Kitchen.. 1 by mouth once daily 8)  Promethazine Hcl 25 Mg Tabs (Promethazine hcl) .Marland Kitchen.. 1 by mouth up to three times a day as needed nausea 9)  Sustenex Otc  .... Take one daily  Patient Instructions: 1)  stick to very bland diet -- bananas/ bread /potato / rice-- stay away from spicy and fatty foods  2)  make sure to keep up fluid intake - gatorade is ok  3)  update me if worse or pain or fever  4)  immodium-- few pills is ok to  try - but stop if it causes abdominal pain  5)  labs and stool tests today  Current Allergies (reviewed today): ! AUGMENTIN ! * TAPE ! LIPITOR (ATORVASTATIN) ! NEURONTIN ! * ALBUTEROL ! CRESTOR ! VYTORIN ! FOSAMAX ! ACTONEL ! BONIVA ! * DILTIAZEM ! SULFA

## 2011-01-16 NOTE — Assessment & Plan Note (Signed)
Summary: SINUS SXS   Vital Signs:  Patient profile:   77 year old female Height:      62 inches Weight:      136 pounds BMI:     24.96 Temp:     97.8 degrees F oral Pulse rate:   84 / minute Pulse rhythm:   regular BP sitting:   166 / 84  (left arm) Cuff size:   regular  Vitals Entered By: Delilah Shan CMA Duncan Dull) (October 20, 2010 2:02 PM) CC: Sinus symptoms   History of Present Illness: Having pressure across face and some HA.  Occ 'dizzy' sensation but this isn't a feeling where the room spins per patient.  Some tooth pain, R upper side.  No FCNAV.  No sputum, no cough.  Going on for about 3 days.    Allergies: 1)  ! Augmentin 2)  ! * Tape 3)  ! Lipitor (Atorvastatin) 4)  ! Neurontin 5)  ! * Albuterol 6)  ! Crestor 7)  ! Vytorin 8)  ! Fosamax 9)  ! Actonel 10)  ! Boniva 11)  ! * Diltiazem 12)  ! Sulfa  Review of Systems       See HPI.  Otherwise negative.    Physical Exam  General:  GEN: nad, alert and oriented HEENT: mucous membranes moist, TM w/o erythema, nasal epithelium injected, OP with cobblestoning, max sinuses slightly tender to palpation  NECK: supple w/o LA CV: rrr. PULM: ctab, no inc wob ABD: soft, +bs EXT: no edema    Impression & Recommendations:  Problem # 1:  URI (ICD-465.9) She tolerates amoxil.  I talked to patient about holding the rx for another 2-3 days and then starting if not improving.  She agrees.  Nasal saline in meantime and supportive tx o/w.  follow up as needed.  she understood.  nontoxic and okay for outpatient follow up.  This is likely a self resolving viral illness but I would tx if symptoms continue for longer than 1 week.   Complete Medication List: 1)  Caltrate 600+d Plus 600-400 Mg-unit Tabs (Calcium carbonate-vit d-min) .... Take 1 tablet by mouth two times a day 2)  Hydrocodone-acetaminophen 7.5-325 Mg Tabs (Hydrocodone-acetaminophen) .... One by mouth every 6 hours as needed pain 3)  Lorazepam 1 Mg Tabs  (Lorazepam) .... Take 1 tablet by mouth once a day as needed 4)  Omeprazole 40 Mg Cpdr (Omeprazole) .... Take 1 tablet by mouth once a day 5)  Gas-x Extra Strength 125 Mg Caps (Simethicone) .... Otc as directed 6)  Amoxicillin 875 Mg Tabs (Amoxicillin) .Marland Kitchen.. 1 by mouth two times a day  Patient Instructions: 1)  I would hold onto the antibiotic prescription and start it in a few days if you aren't getting better.  Get plenty of rest, drink lots of clear liquids, and use Tylenol for fever and comfort. I would try nasal saline for your congestion.  Take care.  Glad to see you today.  Prescriptions: AMOXICILLIN 875 MG TABS (AMOXICILLIN) 1 by mouth two times a day  #20 x 0   Entered and Authorized by:   Crawford Givens MD   Signed by:   Crawford Givens MD on 10/20/2010   Method used:   Print then Give to Patient   RxID:   [id number]    Orders Added: 1)  Est. Patient Level III [14782]    Current Allergies (reviewed today): ! AUGMENTIN ! * TAPE ! LIPITOR (ATORVASTATIN) ! NEURONTIN ! * ALBUTEROL !  CRESTOR ! VYTORIN ! FOSAMAX ! ACTONEL ! BONIVA ! * DILTIAZEM ! SULFA

## 2011-01-16 NOTE — Progress Notes (Signed)
Summary: pt stopped diltiazem  Phone Note Call from Patient Call back at Home Phone [phone]   Caller: Patient Call For: Judith Part MD Summary of Call: Pt states she stopped taking diltiazem all together because her BP kept going down, even with just the half a pill.  She wonders if she should have done that because now her heart seems to be skipping beats off and on.  Her blood pressure is doing well,  116/76.  Also, she has been under a lot of stress, with her daughter and her family moving in.  She is asking if going off the diltiazem could be causing the irregular heart rate.   Initial call taken by: Lowella Petties CMA,  April 14, 2010 9:28 AM  Follow-up for Phone Call        stay off of it  f/u when able  going off diltiazem (which slows hr)- could worsen her palpitations  if worse or cp over wkend - call for sat clinic or go to UC  otherwise will disc in more detail at f/u Follow-up by: Judith Part MD,  April 14, 2010 9:35 AM  Additional Follow-up for Phone Call Additional follow up Details #1::        No answer and no v/m at [phone]. No other # listed for pt in IDX. I will try again.Lewanda Rife LPN  April 14, 2010 10:56 AM   Patient notified as instructed by telephone. Pt scheduled appt with Dr. Milinda Antis 04/18/10 at 10:15am. If problem before that appt pt will go to UC.Lewanda Rife LPN  April 14, 2010 11:07 AM    New Allergies: ! * DILTIAZEM New Allergies: ! * DILTIAZEM

## 2011-01-16 NOTE — Assessment & Plan Note (Signed)
Summary: DIARRHEA/CLE   Vital Signs:  Patient profile:   77 year old female Weight:      136 pounds BMI:     24.96 Temp:     97.7 degrees F oral Pulse rate:   84 / minute Pulse rhythm:   regular BP sitting:   124 / 84  (left arm) Cuff size:   regular  Vitals Entered By: Lowella Petties CMA (January 05, 2010 9:45 AM) CC: Water diarrhea, nausea   History of Present Illness: got a GI bug started with nausea on fri diarrhea on sat  now dry heaves  stools are almost just colored water  going at least 5 times per day  no cramping or abd pain , no blood in stool , normal to have a lot of gas  no appetite ate pudding and cookies yesterday (got ensure but has not drank it yet)  drinking gatorade and water for fluids  no fever or chills or aches- but is generally tired  no sick contacts -- but this may be going around her church   last thing she ate -- was 1/2 honey bun ast sunday  ? what she ate friday   she did get some immodium ad-- but this made her stomach hurt- so did not take again   is due for dexa  wants to set up later in the year        Allergies: 1)  ! Augmentin 2)  ! * Tape 3)  ! Lipitor (Atorvastatin) 4)  ! Neurontin 5)  ! * Albuterol 6)  ! Crestor 7)  ! Vytorin 8)  ! Fosamax 9)  ! Actonel 10)  ! 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  Shingles- post herpetic neuralgia (pain clinic in past) CAD- mild   cardiol- ortho--[facility]   Past Surgical History: Last updated: 12/20/2009 Gallbladder 1997 Breast Biopsy 1988 Hysterectomy sept 1995 (for bleeding , not cancer ) Rectocele 2004 colonosc 1/06- was nl , no polyps  EGD- in past , nl  cardiac work up  with cath (mild CAD) dexa 11/08 OP  cardiologist -- [place]   Family History: Last updated: 11/30/2009 Family History of Arthritis (parent, grandparent, other relative) Family History Breast cancer 1st degree relative <50 (mother, other blood relative) Family History Hypertension(parent, grandparent, other relative) Family History of Heart disease(parent, grandparent, other relative) Family History of Colon CA (other relative)- sister  Family History of Stroke (other relative)  daughter with thyroid ca, and ? lymphoma   daughter is Jillian Oconnell - pt here   sister colon ca   Social History: Last updated: 05/30/2009 Retired- homemaker  Married- cares for husband who has heart dz  Never Smoked Alcohol use-no Drug use-no Regular exercise-no G3P3 moved here from [place] - 2 years ago to be closer to family  Risk Factors: Exercise: no (02/23/2009)  Risk Factors: Smoking Status: never (02/23/2009)  Review of Systems General:  Denies fatigue and malaise. Eyes:  Denies blurring and eye pain. ENT:  Denies nosebleeds and sinus pressure. CV:  Denies chest pain or discomfort, lightheadness, and palpitations. Resp:  Denies cough and shortness of breath. GI:  Complains of gas; denies abdominal pain, bloody stools, dark tarry stools, hemorrhoids, and vomiting blood. GU:  Denies dysuria,  hematuria, and urinary frequency. Derm:  Denies itching and rash. Neuro:  Denies headaches. Endo:  Denies excessive thirst and excessive urination.  Physical Exam  General:  Well-developed,well-nourished,in no acute distress; alert,appropriate and cooperative throughout examination Head:  normocephalic, atraumatic, and no abnormalities observed.   Eyes:  vision grossly intact, pupils equal, pupils round, and pupils reactive to light.  no conjunctival pallor, injection or icterus  Mouth:  pharynx pink and moist.   Neck:  supple with full rom and no masses or thyromegally, no JVD or carotid bruit   Lungs:  Normal respiratory effort, chest expands symmetrically. Lungs are clear to auscultation, no crackles or wheezes. Heart:  Normal rate and regular rhythm. S1 and S2 normal without gallop, murmur, click, rub or other extra sounds. Abdomen:  Bowel sounds positive,abdomen soft and non-tender without masses, organomegaly or hernias noted. Extremities:  No clubbing, cyanosis, edema, or deformity noted with normal full range of motion of all joints.   Skin:  Intact without suspicious lesions or rashes no pallor or jaundice  Cervical Nodes:  No lymphadenopathy noted Inguinal Nodes:  No significant adenopathy Psych:  normal affect, talkative and pleasant    Impression & Recommendations:  Problem # 1:  DIARRHEA (ICD-787.91) Assessment New with nausea and poor appetite  suspect viral gastroenteritis  no abd pain unless she takes diarrhea med/ and no signs of dehydration enc good fluid intake/ brat diet when tol  cbc and basic panel tody if not imp by mon - then plan stool studies  Orders: Venipuncture (62130) TLB-Lipid Panel (80061-LIPID) TLB-BMP (Basic Metabolic Panel-BMET) (80048-METABOL) TLB-CBC Platelet - w/Differential (85025-CBCD)  Problem # 2:  HYPERLIPIDEMIA (ICD-272.4) Assessment: Unchanged  checking chol on zetia in statin intol pt  she does eat low sat fat diet  Her updated medication list for this problem includes:    Zetia 10 Mg Tabs (Ezetimibe) .Marland Kitchen... 1 by mouth once daily  Orders: Venipuncture (86578) TLB-Lipid Panel (80061-LIPID) TLB-BMP (Basic Metabolic Panel-BMET) (80048-METABOL) TLB-CBC Platelet - w/Differential (85025-CBCD)  Labs Reviewed: SGOT: 19 (10/03/2009)   SGPT: 15 (10/03/2009)   HDL:60.70 (10/03/2009), 57.60 (07/07/2009)  LDL:81 (07/07/2009)  Chol:281 (10/03/2009), 169 (07/07/2009)  Trig:142.0 (10/03/2009), 150.0 (07/07/2009)  Complete Medication List: 1)  Caltrate 600+d Plus 600-400 Mg-unit Tabs (Calcium carbonate-vit d-min) .... Take 1 tablet  by mouth two times a day 2)  Diltiazem Hcl 90 Mg Tabs (Diltiazem hcl) .... Take 1 tablet by mouth once a day 3)  Hydrocodone-acetaminophen 7.5-325 Mg Tabs (Hydrocodone-acetaminophen) .... One by mouth every 6 hours as needed pain 4)  Lorazepam 1 Mg Tabs (Lorazepam) .... Take 1 tablet by mouth once a day as needed 5)  Omeprazole 40 Mg Cpdr (Omeprazole) .... Take 1 tablet by mouth once a day 6)  Singulair 10 Mg Tabs (Montelukast sodium) .... Take 1 tablet by mouth once a day 7)  Gas-x Extra Strength 125 Mg Caps (Simethicone) .... Otc as directed 8)  Anusol-hc 2.5 % Crea (Hydrocortisone) .... Apply to affected area once daily as needed hemorroids 9)  Zetia 10 Mg Tabs (Ezetimibe) .Marland Kitchen.. 1 by mouth once daily 10)  Promethazine Hcl 25 Mg Tabs (Promethazine hcl) .Marland Kitchen.. 1 by mouth up to three times a day as needed nausea  Patient Instructions: 1)  try phenergan for nausea -- use caution as it will sedate  2)  I sent the px to your pharmacy 3)  keep up a good fluid intake  4)  diet as tolerate-- BRAT (bananas, rice, applesauce and toast)- in small amount  5)  labs today  6)  if diarrhea is not improved more by monday - I will need to do stool tests - so call  7)  call if worse or abdominal pain or fever  Prescriptions: PROMETHAZINE HCL 25 MG TABS (PROMETHAZINE HCL) 1 by mouth up to three times a day as needed nausea  #20 x 0   Entered and Authorized by:   Judith Part MD   Signed by:   Judith Part MD on 01/05/2010   Method used:   Electronically to        [facility]  [address] (retail)       [address], [address]       [place], [place]  [address]       Ph: [phone]       Fax: [phone]   RxID:   [id number]   Prior Medications (reviewed today): CALTRATE 600+D PLUS 600-400 MG-UNIT TABS (CALCIUM CARBONATE-VIT D-MIN) Take 1 tablet by mouth two times a day DILTIAZEM HCL 90 MG TABS (DILTIAZEM HCL) Take 1 tablet by mouth once a  day HYDROCODONE-ACETAMINOPHEN 7.5-325 MG TABS (HYDROCODONE-ACETAMINOPHEN) one by mouth every 6 hours as needed pain LORAZEPAM 1 MG TABS (LORAZEPAM) Take 1 tablet by mouth once a day as needed OMEPRAZOLE 40 MG CPDR (OMEPRAZOLE) Take 1 tablet by mouth once a day SINGULAIR 10 MG TABS (MONTELUKAST SODIUM) Take 1 tablet by mouth once a day GAS-X EXTRA STRENGTH 125 MG CAPS (SIMETHICONE) OTC as directed ANUSOL-HC 2.5 % CREA (HYDROCORTISONE) apply to affected area once daily as needed hemorroids ZETIA 10 MG TABS (EZETIMIBE) 1 by mouth once daily Current Allergies: ! AUGMENTIN ! * TAPE ! LIPITOR (ATORVASTATIN) ! NEURONTIN ! * ALBUTEROL ! CRESTOR ! VYTORIN ! FOSAMAX ! ACTONEL ! BONIVA

## 2011-01-16 NOTE — Assessment & Plan Note (Signed)
Summary: ARM IS SORE   Vital Signs:  Patient profile:   77 year old female Height:      62 inches Weight:      136.0 pounds BMI:     24.96 Temp:     97.9 degrees F oral Pulse rate:   84 / minute Pulse rhythm:   regular BP sitting:   150 / 90  (left arm) Cuff size:   regular  Vitals Entered By: Benny Lennert CMA Duncan Dull) (August 03, 2010 10:41 AM)  History of Present Illness: Chief complaint Left arm is sore  77 year old female:  pleasant patient who was reaching around with her left shoulder behind her approximately 2 weeks or so ago, and suddenly felt a significant amount of pain.  Since that time she has had pain in abduction and internal rotation.  She does have a history of some shoulder injury approximately 20 years ago or more ago, however she never sought medical attention, and so unclear of exact diagnosis. No known history of traumatic fracture operative intervention.  She also brings up a history of some median nerve distribution paresthesias.  Review of systems as above. Able to eat drink, no nauseous or vomits  Allergies: 1)  ! Augmentin 2)  ! * Tape 3)  ! Lipitor (Atorvastatin) 4)  ! Neurontin 5)  ! * Albuterol 6)  ! Crestor 7)  ! Vytorin 8)  ! Fosamax 9)  ! Actonel 10)  ! Boniva 11)  ! * Diltiazem 12)  ! Sulfa  Past History:  Past medical, surgical, family and social histories (including risk factors) reviewed, and no changes noted (except as noted below).  Past Medical History: Reviewed history from 04/18/2010 and no changes required. 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 Gallbladder 1997 Breast Biopsy 1988 Hysterectomy sept 1995 (for bleeding , not cancer ) Rectocele 2004 colonosc 1/06- was nl , no polyps  EGD- in past , nl  cardiac work up with cath (mild CAD) dexa 11/08 OP colonosc 3/11 nl -- re check 5 y  cardiologist -- [place]   Family History: Reviewed history from 11/30/2009 and no changes required. Family History of Arthritis (parent, grandparent, other relative) Family History Breast cancer 1st degree relative <50 (mother, other blood relative) Family History Hypertension(parent, grandparent, other relative) Family History of Heart disease(parent, grandparent, other relative) Family History of Colon CA (other relative)- sister  Family History of Stroke (other relative)  daughter with thyroid ca, and ? lymphoma   daughter is Doneen Poisson - pt here   sister colon ca   Social History: Reviewed history from 05/30/2009 and no changes required. Retired- homemaker  Married- cares for husband who has heart dz  Never Smoked Alcohol use-no Drug use-no Regular exercise-no G3P3 moved here from [place] - 2 years ago to be closer to family  Physical Exam  General:  GEN: Well-developed,well-nourished,in no acute distress; alert,appropriate and cooperative throughout examination HEENT: Normocephalic and atraumatic without obvious abnormalities. No apparent alopecia or balding. Ears, externally no deformities PULM: Breathing comfortably in no respiratory distress EXT: No clubbing, cyanosis, or edema PSYCH: Normally interactive. Cooperative during  the interview. Pleasant. Friendly and conversant. Not anxious or depressed appearing. Normal, full affect.  Msk:  left shoulder: Full range of motion, however, significantly painful arc of motion throughout majority of complaint of abduction. Also significant pain in flexion and with internal rotation. Patient lacks approximately 30-40 of internal  rotation, due to pain.  Strength testing: Abduction, 4-/5. External rotation: 4+/5 Internal rotation: 4 minus/5 Flexion: 4+/5  Negative drop test. Positive Jobe test. Unable to complete other impingement testing given pain.  Neurovascularly intact.   Impression & Recommendations:  Problem # 1:  ROTATOR CUFF INJURY, LEFT SHOULDER (ICD-726.10) Assessment New mechanism, history and examination is most consistent with partial thickness rotator cuff tear. Examination not consistent with full-thickness tear.  At this point, given age, we can follow clinically. The majority of these cases will improve nonoperatively.  Given a range of motion protocol and some isometric strengthening from Dr. Caralee Ates.  Reassess in 6 weeks.  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 There is no evidence of occult fracture or dislocation.  Her updated medication list for this problem includes:    Hydrocodone-acetaminophen 7.5-325 Mg Tabs (Hydrocodone-acetaminophen) ..... One by mouth every 6 hours as needed pain    Tramadol Hcl 50 Mg Tabs (Tramadol hcl) .Marland Kitchen... 1 by mouth 4 times daily  Orders: T-Shoulder Left Min 2 Views (73030TC)  Complete Medication List: 1)  Caltrate 600+d Plus 600-400 Mg-unit Tabs (Calcium carbonate-vit d-min) .... Take 1 tablet by mouth two times a day 2)  Hydrocodone-acetaminophen 7.5-325 Mg Tabs (Hydrocodone-acetaminophen) .... One by mouth every 6 hours as needed pain 3)  Lorazepam 1 Mg Tabs (Lorazepam) .... Take 1 tablet by mouth once a day as needed 4)  Omeprazole 40 Mg Cpdr (Omeprazole) .... Take 1 tablet by mouth once a day 5)  Singulair 10 Mg Tabs (Montelukast sodium) .... Take 1 tablet by mouth once a day 6)  Gas-x Extra Strength 125 Mg Caps (Simethicone) .... Otc as directed 7)  Zetia 10 Mg Tabs (Ezetimibe) .Marland Kitchen.. 1 by mouth once  daily 8)  Promethazine Hcl 25 Mg Tabs (Promethazine hcl) .Marland Kitchen.. 1 by mouth up to three times a day as needed nausea 9)  Sustenex Otc  .... Take one daily 10)  Tramadol Hcl 50 Mg Tabs (Tramadol hcl) .Marland Kitchen.. 1 by mouth 4 times daily  Patient Instructions: 1)  f/u 6 weeks 2)  Start doing range of motion exercises and basic strengthening every day Prescriptions: TRAMADOL HCL 50 MG  TABS (TRAMADOL HCL) 1 by mouth 4 times daily  #60 x 1   Entered and Authorized by:   Hannah Beat MD   Signed by:   Hannah Beat MD on 08/03/2010   Method used:   Print then Give to Patient   RxID:   [id number]   Current Allergies (reviewed today): ! AUGMENTIN ! * TAPE ! LIPITOR (ATORVASTATIN) ! NEURONTIN ! * ALBUTEROL ! CRESTOR ! VYTORIN ! FOSAMAX ! ACTONEL ! BONIVA ! * DILTIAZEM ! SULFA

## 2011-01-16 NOTE — Progress Notes (Signed)
Summary: vicodin  Phone Note Refill Request Message from:  Patient on July 19, 2010 9:19 AM  Refills Requested: Medication #1:  HYDROCODONE-ACETAMINOPHEN 7.5-325 MG TABS one by mouth every 6 hours as needed pain [facility] [address].    Method Requested: Telephone to Pharmacy Initial call taken by: Melody Comas,  July 19, 2010 9:19 AM  Follow-up for Phone Call        that was px on 7/6 I think with 3 refils  is she out?- how many is she taking per day?  Follow-up by: Judith Part MD,  July 19, 2010 10:09 AM  Additional Follow-up for Phone Call Additional follow up Details #1::        Spoke w/ patient, she wan't aware of the refills, she is not out of medication.  Additional Follow-up by: Melody Comas,  July 19, 2010 11:03 AM

## 2011-01-16 NOTE — Consult Note (Signed)
Summary: [facility]  [facility]   Imported By: Lanelle Bal 02/14/2010 09:28:21  _____________________________________________________________________  External Attachment:    Type:   Image     Comment:   External Document

## 2011-01-16 NOTE — Progress Notes (Signed)
Summary: refill request for vicodin  Phone Note Refill Request Message from:  [employer]  Refills Requested: Medication #1:  HYDROCODONE-ACETAMINOPHEN 7.5-325 MG TABS one by mouth every 6 hours as needed pain   Last Refilled: 11/22/2009 Electronic request from [facility] [address].  Initial call taken by: Lowella Petties CMA,  December 21, 2009 11:33 AM  Follow-up for Phone Call        px written on EMR for call in  Follow-up by: Judith Part MD,  December 21, 2009 1:06 PM  Additional Follow-up for Phone Call Additional follow up Details #1::        [facility]ed to [facility]. Additional Follow-up by: Lowella Petties CMA,  December 21, 2009 2:44 PM    Prescriptions: HYDROCODONE-ACETAMINOPHEN 7.5-325 MG TABS (HYDROCODONE-ACETAMINOPHEN) one by mouth every 6 hours as needed pain  #60 x 0   Entered and Authorized by:   Judith Part MD   Signed by:   Lowella Petties CMA on 12/21/2009   Method used:   Telephoned to ...         RxID:   [id number]

## 2011-01-16 NOTE — Progress Notes (Signed)
Summary: Hydrocodone Acetaminophen 7.5-325mg   Phone Note Refill Request Call back at [phone] Message from:  [facility] [address] on October 13, 2010 1:58 PM  Refills Requested: Medication #1:  HYDROCODONE-ACETAMINOPHEN 7.5-325 MG TABS one by mouth every 6 hours as needed pain   Last Refilled: 09/15/2010 [facility] [address] electronically request refill on Hydrocodone acetaminophen 7.5-325mg . Last refill date 09/15/10. Dr Milinda Antis will not be back until Tues. and please forward your response to [place] because I am leaving now. Thank you.   Method Requested: Telephone to Pharmacy Initial call taken by: Lewanda Rife LPN,  October 13, 2010 2:00 PM  Follow-up for Phone Call        please call in.  thanks.  Follow-up by: Crawford Givens MD,  October 13, 2010 2:03 PM  Additional Follow-up for Phone Call Additional follow up Details #1::        Medication phoned to pharmacy.  Additional Follow-up by: Delilah Shan CMA Brittnei Jagiello Dull),  October 13, 2010 2:36 PM    Prescriptions: HYDROCODONE-ACETAMINOPHEN 7.5-325 MG TABS (HYDROCODONE-ACETAMINOPHEN) one by mouth every 6 hours as needed pain  #60 x 1   Entered and Authorized by:   Crawford Givens MD   Signed by:   Crawford Givens MD on 10/13/2010   Method used:   Telephoned to ...         RxID:   [id number]

## 2011-01-16 NOTE — Miscellaneous (Signed)
Summary: mammogram results  Clinical Lists Changes  Observations: Added new observation of MAMMO DUE: 01/2011 (01/05/2010 8:18) Added new observation of MAMMOGRAM: normal (01/05/2010 8:18)      Preventive Care Screening  Mammogram:    Date:  01/05/2010    Next Due:  01/2011    Results:  normal

## 2011-01-16 NOTE — Assessment & Plan Note (Signed)
Summary: feeling nervous and stressed/alc   Vital Signs:  Patient profile:   77 year old female Height:      62 inches Weight:      136 pounds BMI:     24.96 Temp:     97.8 degrees F oral Pulse rate:   80 / minute Pulse rhythm:   irregular BP sitting:   136 / 84  (left arm) Cuff size:   regular  Vitals Entered By: Lewanda Rife LPN (August 28, 2010 12:35 PM) CC: nervous and stressed, heart skips beats sometimes (has been doing long time)   History of Present Illness: still having 4 person family living with them the kids are working  has talked to them about it in general -making a plan to move out   a lot of stress there  is feeling more nervous  anxiety comes on in waves - like a panic attack (that lasts minutes to hours )  tries to settle herself down / deep breaths  makes her heart race and sense of doom   has long hx of palpitations and that is worse with stress    had rotator cuff injury -- saw Dr Patsy Lager / did not do injection  did not want to do it yet  decided not to take med -- tramadol  that makes her more nervous and more stressed also her hip hurts   off cholesterol med because she could not afford it       Allergies: 1)  ! Augmentin 2)  ! * Tape 3)  ! Lipitor (Atorvastatin) 4)  ! Neurontin 5)  ! * Albuterol 6)  ! Crestor 7)  ! Vytorin 8)  ! Fosamax 9)  ! Actonel 10)  ! Boniva 11)  ! * Diltiazem 12)  ! 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  Shingles- post herpetic neuralgia (pain clinic in past) CAD- mild  IBS  cardiol- ortho--[facility]  GI- Dr Bluford Kaufmann  Past Surgical History: Last updated: 03/07/2010 Gallbladder 1997 Breast Biopsy  1988 Hysterectomy sept 1995 (for bleeding , not cancer ) Rectocele 2004 colonosc 1/06- was nl , no polyps  EGD- in past , nl  cardiac work up with cath (mild CAD) dexa 11/08 OP colonosc 3/11 nl -- re check 5 y  cardiologist --    Family History: Last updated: 08/28/2010 Family History of Arthritis (parent, grandparent, other relative) Family History Breast cancer 1st degree relative <50 (mother, other blood relative) Family History Hypertension(parent, grandparent, other relative) Family History of Heart disease(parent, grandparent, other relative) Family History of Colon CA (other relative)- sister  Family History of Stroke (other relative)  daughter with thyroid ca, and ? lymphoma   daughter is Doneen Poisson - pt here   sister colon ca  daughter with anxiety  Social History: Last updated: 05/30/2009 Retired- homemaker  Married- cares for husband who has heart dz  Never Smoked Alcohol use-no Drug use-no Regular exercise-no G3P3 moved here from [place] - 2 years ago to be closer to family  Risk Factors: Exercise: no (02/23/2009)  Risk Factors: Smoking Status: never (02/23/2009)  Family History: Family History of Arthritis (parent, grandparent, other relative) Family History Breast cancer 1st degree relative <50 (mother, other blood relative) Family History Hypertension(parent, grandparent,  other relative) Family History of Heart disease(parent, grandparent, other relative) Family History of Colon CA (other relative)- sister  Family History of Stroke (other relative)  daughter with thyroid ca, and ? lymphoma   daughter is Doneen Poisson - pt here   sister colon ca  daughter with anxiety  Review of Systems General:  Complains of fatigue; denies fever, loss of appetite, and malaise. Eyes:  Denies eye irritation. CV:  Denies chest pain or discomfort, lightheadness, and palpitations. Resp:  Denies cough, shortness of breath, and wheezing. GI:  Denies  abdominal pain, bloody stools, change in bowel habits, indigestion, and loss of appetite. GU:  Denies dysuria and urinary frequency. MS:  Complains of joint pain and stiffness; denies joint redness, joint swelling, cramps, and muscle weakness. Derm:  Denies itching, lesion(s), poor wound healing, and rash. Neuro:  Denies numbness and tingling. Psych:  Complains of anxiety, depression, and panic attacks; denies sense of great danger and suicidal thoughts/plans. Endo:  Denies cold intolerance, excessive thirst, excessive urination, and heat intolerance. Heme:  Denies abnormal bruising and bleeding.   Impression & Recommendations:  Problem # 1:  ANXIETY (ICD-300.00) Assessment Deteriorated  this is worse with poor social situation need to work on resolving home issues disc symptoms trial of buspar  disc situational stress / coping skills/ tx opt/ symptoms and poss side eff in detail spent 25 minutes face to face time with pt , over 50% of which was spent on counseling and coordination of care   Her updated medication list for this problem includes:    Lorazepam 1 Mg Tabs (Lorazepam) .Marland Kitchen... Take 1 tablet by mouth once a day as needed    Buspirone Hcl 15 Mg Tabs (Buspirone hcl) .Marland Kitchen... 1/2 by mouth two times a day  Orders: Prescription Created Electronically [phone])  Problem # 2:  ROTATOR CUFF INJURY, LEFT SHOULDER (ICD-726.10)  f/u Dr Patsy Lager pt does not want to try tramadol shei is interested in injection if it is appropriate   Orders: Prescription Created Electronically [phone])  Complete Medication List: 1)  Caltrate 600+d Plus 600-400 Mg-unit Tabs (Calcium carbonate-vit d-min) .... Take 1 tablet by mouth two times a day 2)  Hydrocodone-acetaminophen 7.5-325 Mg Tabs (Hydrocodone-acetaminophen) .... One by mouth every 6 hours as needed pain 3)  Lorazepam 1 Mg Tabs (Lorazepam) .... Take 1 tablet by mouth once a day as needed 4)  Omeprazole 40 Mg Cpdr (Omeprazole) .... Take 1 tablet  by mouth once a day 5)  Singulair 10 Mg Tabs (Montelukast sodium) .... Take 1 tablet by mouth once a day as needed 6)  Gas-x Extra Strength 125 Mg Caps (Simethicone) .... Otc as directed 7)  Zetia 10 Mg Tabs (Ezetimibe) .Marland Kitchen.. 1 by mouth once daily 8)  Promethazine Hcl 25 Mg Tabs (Promethazine hcl) .Marland Kitchen.. 1 by mouth up to three times a day as needed nausea 9)  Sustenex Otc  .... Take one daily as needed 10)  Tramadol Hcl 50 Mg Tabs (Tramadol hcl) .Marland Kitchen.. 1 by mouth 4 times daily 11)  Buspirone Hcl 15 Mg Tabs (Buspirone hcl) .... 1/2 by mouth two times a day  Other Orders: Radiology Referral (Radiology)  Patient Instructions: 1)  we will schedule dexa at check out  2)  try the buspar -- and if any side effects or you feel worse stop it and let me know  3)  follow up with me in 1 month to see how it is working  4)  I sent it to your pharmacy  Prescriptions: BUSPIRONE HCL 15 MG TABS (BUSPIRONE HCL) 1/2 by mouth two times a day  #30 x 11   Entered and Authorized by:   Judith Part MD   Signed by:   Judith Part MD on 08/28/2010   Method used:   Electronically to        [facility] [address] (retail)       [address], [address]       [place], [place]  [address]       Ph: [phone]       Fax: [phone]   RxID:   [id number]   Current Allergies (reviewed today): ! AUGMENTIN ! * TAPE ! LIPITOR (ATORVASTATIN) ! NEURONTIN ! * ALBUTEROL ! CRESTOR ! VYTORIN ! FOSAMAX ! ACTONEL ! BONIVA ! * DILTIAZEM ! SULFA

## 2011-01-16 NOTE — Progress Notes (Signed)
Summary: refill request for vicodin  Phone Note Refill Request Message from:  Patient  Refills Requested: Medication #1:  HYDROCODONE-ACETAMINOPHEN 7.5-325 MG TABS one by mouth every 6 hours as needed pain Please call to [facility] [address].  Initial call taken by: Lowella Petties CMA,  June 21, 2010 10:39 AM  Follow-up for Phone Call        px written on EMR for call in  Follow-up by: Judith Part MD,  June 21, 2010 10:42 AM  Additional Follow-up for Phone Call Additional follow up Details #1::        Rx called to pharmacy Additional Follow-up by: Linde Gillis CMA Duncan Dull),  June 21, 2010 11:51 AM    New/Updated Medications: HYDROCODONE-ACETAMINOPHEN 7.5-325 MG TABS (HYDROCODONE-ACETAMINOPHEN) one by mouth every 6 hours as needed pain Prescriptions: HYDROCODONE-ACETAMINOPHEN 7.5-325 MG TABS (HYDROCODONE-ACETAMINOPHEN) one by mouth every 6 hours as needed pain  #60 x 3   Entered by:   Linde Gillis CMA (AAMA)   Authorized by:   Judith Part MD   Signed by:   Linde Gillis CMA (AAMA) on 06/21/2010   Method used:   Telephoned to ...       [facility] [address] (retail)       [address]       [place], [place]  [address]       Ph: [phone]       Fax: [phone]   RxID:   [id number]   Prior Medications: CALTRATE 600+D PLUS 600-400 MG-UNIT TABS (CALCIUM CARBONATE-VIT D-MIN) Take 1 tablet by mouth two times a day LORAZEPAM 1 MG TABS (LORAZEPAM) Take 1 tablet by mouth once a day as needed OMEPRAZOLE 40 MG CPDR (OMEPRAZOLE) Take 1 tablet by mouth once a day SINGULAIR 10 MG TABS (MONTELUKAST SODIUM) Take 1 tablet by mouth once a day GAS-X EXTRA STRENGTH 125 MG CAPS (SIMETHICONE) OTC as directed ZETIA 10 MG TABS (EZETIMIBE) 1 by mouth once daily PROMETHAZINE HCL 25 MG TABS (PROMETHAZINE HCL) 1 by mouth up to three times a day as needed nausea SUSTENEX OTC () take one daily Current Allergies: ! AUGMENTIN ! * TAPE ! LIPITOR  (ATORVASTATIN) ! NEURONTIN ! * ALBUTEROL ! CRESTOR ! VYTORIN ! FOSAMAX ! ACTONEL ! BONIVA ! * DILTIAZEM ! SULFA

## 2011-01-16 NOTE — Assessment & Plan Note (Signed)
Summary: BP LOW/RI   Vital Signs:  Patient profile:   77 year old female Height:      62 inches Weight:      135.75 pounds BMI:     24.92 Temp:     97.7 degrees F oral Pulse rate:   84 / minute Pulse rhythm:   regular BP sitting:   128 / 78  (left arm) Cuff size:   regular  Vitals Entered By: Lewanda Rife LPN (Apr 18, 1190 10:14 AM) CC: BP is low   History of Present Illness: here for f/u of HTN  bp was low on diltiazem so stoppped it  then noticed more palpitations known mild coronary artery disease in past   had work up for palp in past - found to have pacs and pvcs  do not bother her too much  tremendous stress- daughters family was put out of their house and 4 of them live with them  she cries all the time   wt is stale   bp 128/78 today on no meds at all for bp   thinks she has a sinus infection colored nasal drainage facial pain and cheekbones and teeth dry cough    has always had stomach problems  had a virus with diarrhea  - that is better  colonosc is fine    Allergies: 1)  ! Augmentin 2)  ! * Tape 3)  ! Lipitor (Atorvastatin) 4)  ! Neurontin 5)  ! * Albuterol 6)  ! Crestor 7)  ! Vytorin 8)  ! Fosamax 9)  ! Actonel 10)  ! Boniva 11)  ! * Diltiazem 12)  ! Sulfa  Past History:  Past Surgical History: Last updated: 03/07/2010 Gallbladder 1997 Breast Biopsy 1988 Hysterectomy sept 1995 (for bleeding , not cancer ) Rectocele 2004 colonosc 1/06- was nl , no polyps  EGD- in past , nl  cardiac work up with cath (mild CAD) dexa 11/08 OP colonosc 3/11 nl -- re check 5 y  cardiologist -- [place]   Family History: Last updated: 11/30/2009 Family History of Arthritis (parent, grandparent, other relative) Family History Breast cancer 1st degree relative <50 (mother, other blood relative) Family History Hypertension(parent, grandparent, other relative) Family History of Heart disease(parent, grandparent, other relative) Family History of  Colon CA (other relative)- sister  Family History of Stroke (other relative)  daughter with thyroid ca, and ? lymphoma   daughter is Jillian Oconnell - pt here   sister colon ca   Social History: Last updated: 05/30/2009 Retired- homemaker  Married- cares for husband who has heart dz  Never Smoked Alcohol use-no Drug use-no Regular exercise-no G3P3 moved here from [place] - 2 years ago to be closer to family  Risk Factors: Exercise: no (02/23/2009)  Risk Factors: Smoking Status: never (02/23/2009)  Past Medical History: Allergic rhinitis Asthma Depression/anxiety  palpitaions- ativan helps (also tachycardia) GERD Hyperlipidemia Hypertension OA - end stage knees (injections) OP Glaucoma suspect  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 Eyes:  Denies blurring and eye irritation. CV:  Complains of palpitations; denies chest pain or discomfort and lightheadness. Resp:  Denies cough and wheezing. GI:  Complains of diarrhea; denies abdominal pain, bloody stools, change in bowel habits, and nausea; occ  diarrhea from IBS . GU:  Denies dysuria and urinary frequency. MS:  Complains of stiffness; denies muscle aches. Derm:  Denies lesion(s), poor wound healing, and rash. Neuro:  Denies headaches, numbness, and tingling. Psych:  Complains of anxiety; dealing with a lot at home . Endo:  Denies cold intolerance, excessive thirst, excessive urination, and heat intolerance. Heme:  Denies abnormal bruising and bleeding.  Physical Exam  General:  Well-developed,well-nourished,in no acute distress; alert,appropriate and cooperative throughout examination Head:  tender maxillary tenderness bilat normocephalic, atraumatic, and no abnormalities observed.    Eyes:  vision grossly intact, pupils equal, pupils round, pupils reactive to light, and no injection.   Ears:  R ear normal and L ear normal.   Nose:  nares congested and injected bilat no nasal discharge.   Mouth:  pharynx pink and moist.   Neck:  supple with full rom and no masses or thyromegally, no JVD or carotid bruit  Chest Wall:  No deformities, masses, or tenderness noted. Lungs:  Normal respiratory effort, chest expands symmetrically. Lungs are clear to auscultation, no crackles or wheezes. Heart:  Normal rate and regular rhythm. S1 and S2 normal without gallop, murmur, click, rub or other extra sounds. Abdomen:  Bowel sounds positive,abdomen soft and non-tender without masses, organomegaly or hernias noted. no renal bruits  Msk:  No deformity or scoliosis noted of thoracic or lumbar spine.  no acute joint changes  Pulses:  R and L carotid,radial,femoral,dorsalis pedis and posterior tibial pulses are full and equal bilaterally Extremities:  No clubbing, cyanosis, edema, or deformity noted with normal full range of motion of all joints.   Neurologic:  sensation intact to light touch, gait normal, and DTRs symmetrical and normal.   Skin:  Intact without suspicious lesions or rashes Cervical Nodes:  No lymphadenopathy noted Inguinal Nodes:  No significant adenopathy Psych:  mildly anx but pleasant  supportive husb present   Impression & Recommendations:  Problem # 1:  HYPERTENSION (ICD-401.9) Assessment Improved  with no meds- good control at this time some inc in pvc/ pac with stopping ca channel blocker- is tolerating that  will continue to watch  lab today and then will decide f/u Orders: Venipuncture ([address]) TLB-Lipid Panel (80061-LIPID) TLB-Renal Function Panel (80069-RENAL) TLB-CBC Platelet - w/Differential (85025-CBCD) TLB-TSH (Thyroid Stimulating Hormone) (84443-TSH) TLB-ALT (SGPT) (84460-ALT) TLB-AST (SGOT) (84450-SGOT)  BP today: 128/78 Prior BP: 124/84  (01/05/2010)  Labs Reviewed: K+: 4.4 (01/05/2010) Creat: : 1.0 (01/05/2010)   Chol: 181 (01/05/2010)   HDL: 58.50 (01/05/2010)   LDL: 107 (01/05/2010)   TG: 76.0 (01/05/2010)  Problem # 2:  PALPITATIONS (ICD-785.1) Assessment: Deteriorated worse off diltiazema and with stress dx with b9 pvc/pac in past  def counseling for now if this becomes worse or any other symptoms will plan ref to [facility] Orders: TLB-CBC Platelet - w/Differential (85025-CBCD) TLB-TSH (Thyroid Stimulating Hormone) (84443-TSH) EKG w/ Interpretation (93000) Prescription Created Electronically [id number])  Problem # 3:  SINUSITIS - ACUTE-NOS (ICD-461.9) Assessment: New  tx with amox  recommend sympt care- see pt instructions   pt advised to update me if symptoms worsen or do not improve  Her updated medication list for this problem includes:    Amoxicillin 500 Mg Caps (Amoxicillin) .Marland Kitchen... 1 by mouth three times a day for 10 days for sinus infection  Orders: Prescription Created Electronically [id number])  Problem # 4:  DEPRESSION (ICD-311) overall disc socail situation at home that has to change  declinced counseling  good support disc coping tech- has good  insight  Her updated medication list for this problem includes:    Lorazepam 1 Mg Tabs (Lorazepam) .Marland Kitchen... Take 1 tablet by mouth once a day as needed  Complete Medication List: 1)  Caltrate 600+d Plus 600-400 Mg-unit Tabs (Calcium carbonate-vit d-min) .... Take 1 tablet by mouth two times a day 2)  Hydrocodone-acetaminophen 7.5-325 Mg Tabs (Hydrocodone-acetaminophen) .... One by mouth every 6 hours as needed pain 3)  Lorazepam 1 Mg Tabs (Lorazepam) .... Take 1 tablet by mouth once a day as needed 4)  Omeprazole 40 Mg Cpdr (Omeprazole) .... Take 1 tablet by mouth once a day 5)  Singulair 10 Mg Tabs (Montelukast sodium) .... Take 1 tablet by mouth once a day 6)  Gas-x Extra Strength 125 Mg Caps (Simethicone) .... Otc as directed 7)  Anusol-hc 2.5 % Crea  (Hydrocortisone) .... Apply to affected area once daily as needed hemorroids 8)  Zetia 10 Mg Tabs (Ezetimibe) .Marland Kitchen.. 1 by mouth once daily 9)  Promethazine Hcl 25 Mg Tabs (Promethazine hcl) .Marland Kitchen.. 1 by mouth up to three times a day as needed nausea 10)  Sustenex Otc  .... Take one daily 11)  Amoxicillin 500 Mg Caps (Amoxicillin) .Marland Kitchen.. 1 by mouth three times a day for 10 days for sinus infection  Patient Instructions: 1)  stay off bp med 2)  labs today 3)  alert me if palpitations worsen -- will refer you to cardiology  4)  you can try mucinex over the counter twice daily as directed and nasal saline spray for congestion 5)  tylenol over the counter as directed may help with aches, headache and fever 6)  call if symptoms worsen or if not improved in 4-5 days  7)  take the amoxicillin for sinus infection -- I sent to your pharmacy  Prescriptions: HYDROCODONE-ACETAMINOPHEN 7.5-325 MG TABS (HYDROCODONE-ACETAMINOPHEN) one by mouth every 6 hours as needed pain  #60 x 1   Entered and Authorized by:   Judith Part MD   Signed by:   Judith Part MD on 04/18/2010   Method used:   Print then Give to Patient   RxID:   [id number] LORAZEPAM 1 MG TABS (LORAZEPAM) Take 1 tablet by mouth once a day as needed  #30 x 3   Entered and Authorized by:   Judith Part MD   Signed by:   Judith Part MD on 04/18/2010   Method used:   Print then Give to Patient   RxID:   [id number] AMOXICILLIN 500 MG CAPS (AMOXICILLIN) 1 by mouth three times a day for 10 days for sinus infection  #30 x 0   Entered and Authorized by:   Judith Part MD   Signed by:   Judith Part MD on 04/18/2010   Method used:   Electronically to        [facility] [address] ([facility]ail)       [address], [address]       [place], [place]  [address]       Ph: [phone]       Fax: [phone]   RxID:   [id number]   Current Allergies (reviewed today): ! AUGMENTIN ! * TAPE !  LIPITOR (ATORVASTATIN) ! NEURONTIN ! * ALBUTEROL ! CRESTOR ! VYTORIN ! FOSAMAX ! ACTONEL ! BONIVA ! * DILTIAZEM ! SULFA   EKG  Procedure date:  04/18/2010  Findings:      NSR with no  acute changes - rate of 87 no pvc or pac noted

## 2011-01-16 NOTE — Progress Notes (Signed)
Summary: Rx-Vicodin  Phone Note Refill Request Message from:  [employer] on January 23, 2010 12:10 PM  Refills Requested: Medication #1:  HYDROCODONE-ACETAMINOPHEN 7.5-325 MG TABS one by mouth every 6 hours as needed pain   Last Refilled: 12/21/2009 [address] [facility] [address] [phone]  Initial call taken by: Silas Sacramento CMA,  January 23, 2010 12:11 PM  Follow-up for Phone Call        Medication phoned to pharmacy.  Follow-up by: Delilah Shan CMA (AAMA),  January 23, 2010 12:30 PM    Prescriptions: HYDROCODONE-ACETAMINOPHEN 7.5-325 MG TABS (HYDROCODONE-ACETAMINOPHEN) one by mouth every 6 hours as needed pain  #60 x 0   Entered and Authorized by:   Ruthe Mannan MD   Signed by:   Ruthe Mannan MD on 01/23/2010   Method used:   Handwritten   RxID:   [id number]

## 2011-01-16 NOTE — Miscellaneous (Signed)
Summary: Pt Cancelled PT appt/[facility]  Pt Cancelled PT appt/[facility]   Imported By: Sherian Rein 11/21/2010 08:11:01  _____________________________________________________________________  External Attachment:    Type:   Image     Comment:   External Document  Appended Document: Pt Cancelled [facility] noted

## 2011-01-18 NOTE — Progress Notes (Signed)
Summary: Hydrocodone/[facility]  Phone Note Refill Request Message from:  [facility] on December 13, 2010 11:23 AM  Refills Requested: Medication #1:  HYDROCODONE-ACETAMINOPHEN 7.5-325 MG TABS one by mouth every 6 hours as needed pain [facility] [address]   Last Fill Date:  11/14/2010   Pharmacy Phone:  [phone]   Method Requested: Telephone to Pharmacy Initial call taken by: Delilah Shan CMA Duncan Dull),  December 13, 2010 11:24 AM  Follow-up for Phone Call        px written on EMR for call in  Follow-up by: Judith Part MD,  December 13, 2010 11:36 AM  Additional Follow-up for Phone Call Additional follow up Details #1::        Rx called in as directed. Additional Follow-up by: Janee Morn CMA Duncan Dull),  December 13, 2010 1:43 PM    New/Updated Medications: HYDROCODONE-ACETAMINOPHEN 7.5-325 MG TABS (HYDROCODONE-ACETAMINOPHEN) one by mouth every 6 hours as needed pain Prescriptions: HYDROCODONE-ACETAMINOPHEN 7.5-325 MG TABS (HYDROCODONE-ACETAMINOPHEN) one by mouth every 6 hours as needed pain  #60 x 1   Entered and Authorized by:   Judith Part MD   Signed by:   Selena Batten Dance CMA (AAMA) on 12/13/2010   Method used:   Telephoned to ...       [facility] [address] ([facility]ail)       [address], [address]       [place], [place]  [address]       Ph: [phone]       Fax: [phone]   RxID:   [id number]

## 2011-01-24 NOTE — Assessment & Plan Note (Signed)
Summary: CHECK PLACE ON FACE,?CARPEL TUNNEL/   Vital Signs:  Patient profile:   77 year old female Weight:      135 pounds BMI:     24.78 Temp:     97.5 degrees F oral Pulse rate:   80 / minute Pulse rhythm:   regular BP sitting:   122 / 80  (left arm) Cuff size:   regular  Vitals Entered By: Selena Batten Dance CMA Duncan Dull) (January 09, 2011 12:04 PM) CC: Check place on face and ? carpal tunnel   History of Present Illness: has a spot on her face - not new   dermatologist tried to take it off -- freezing and - never fully went away  that was 2 years ago -- ? who was her dermatologist was -- [facility]  ? keratosis   thinks she may have some carpal tunnel syndrome in R wrist  was going to have surgery- but had to move urgently and needs a new doc  goes numb all the time and is swollen- ? perhaps cyst  is R handed   also needs me to schedule her mammogram no lumps on self exam or changes   Allergies: 1)  ! Augmentin 2)  ! * Tape 3)  ! Lipitor (Atorvastatin) 4)  ! Neurontin 5)  ! * Albuterol 6)  ! Crestor 7)  ! Vytorin 8)  ! Fosamax 9)  ! Actonel 10)  ! Boniva 11)  ! * Diltiazem 12)  ! 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  Shingles- post herpetic neuralgia (pain clinic in past) CAD- mild  IBS  cardiol- ortho--[facility]  GI- Dr Bluford Kaufmann  Past Surgical History: Last updated: 03/07/2010 Gallbladder 1997 Breast Biopsy 1988 Hysterectomy sept 1995 (for bleeding , not cancer ) Rectocele 2004 colonosc 1/06- was nl , no polyps  EGD- in past , nl  cardiac work up with cath (mild CAD) dexa 11/08 OP colonosc 3/11 nl -- re check 5 y  cardiologist -- [place]    Family History: Last updated: 08/28/2010 Family History of Arthritis (parent, grandparent, other relative) Family History Breast cancer 1st degree relative <50 (mother, other blood relative) Family History Hypertension(parent, grandparent, other relative) Family History of Heart disease(parent, grandparent, other relative) Family History of Colon CA (other relative)- sister  Family History of Stroke (other relative)  daughter with thyroid ca, and ? lymphoma   daughter is Doneen Poisson - pt here   sister colon ca  daughter with anxiety  Social History: Last updated: 05/30/2009 Retired- homemaker  Married- cares for husband who has heart dz  Never Smoked Alcohol use-no Drug use-no Regular exercise-no G3P3 moved here from [place] - 2 years ago to be closer to family  Risk Factors: Exercise: no (02/23/2009)  Risk Factors: Smoking Status: never (02/23/2009)  Review of Systems General:  Denies fatigue, loss of appetite, and malaise. Eyes:  Denies blurring and eye irritation. CV:  Denies chest pain or discomfort and palpitations. Resp:  Denies cough. GI:  Denies abdominal pain. MS:  Complains of joint pain; denies joint redness and joint swelling. Derm:  Complains of lesion(s); denies itching. Neuro:  Complains of numbness, tingling, and weakness. Psych:  mood is ok . Endo:  Denies excessive thirst and excessive  urination. Heme:  Denies abnormal bruising and bleeding.  Physical Exam  General:  Well-developed,well-nourished,in no acute distress; alert,appropriate and cooperative throughout examination Head:  normocephalic, atraumatic, and no abnormalities observed.   Eyes:  vision grossly intact, pupils equal, pupils round, and pupils reactive to light.   Neck:  supple with full rom and no masses or thyromegally, no JVD or carotid bruit  Lungs:  Normal respiratory effort, chest expands symmetrically. Lungs are clear to auscultation, no crackles or wheezes. Heart:  Normal  rate and regular rhythm. S1 and S2 normal without gallop, murmur, click, rub or other extra sounds. Msk:  r wrist is tender anteriorly without swelling or skin change  nl rom  Pulses:  R and L carotid,radial,femoral,dorsalis pedis and posterior tibial pulses are full and equal bilaterally Extremities:  No clubbing, cyanosis, edema, or deformity noted with normal full range of motion of all joints.   Neurologic:  pos tinel and phalen tests in R wrist  neg in L  Skin:  raised flesh color 1 cm lesion on R cheek Cervical Nodes:  No lymphadenopathy noted Psych:  normal affect, talkative and pleasant    Impression & Recommendations:  Problem # 1:  SKIN LESION (ICD-709.9) Assessment New sk on face that has reoccured after cryo-- bigger and less typical looking ref to derm Orders: Dermatology Referral (Derma)  Problem # 2:  CARPAL TUNNEL SYNDROME (ICD-354.0) Assessment: New pt needs to disc surgical opt  ref to hand doctor wrist splint given  Orders: Orthopedic Referral (Ortho)  Problem # 3:  OTHER SCREENING MAMMOGRAM (ICD-V76.12) Assessment: Comment Only ref for yearly mam Orders: Radiology Referral (Radiology)  Complete Medication List: 1)  Caltrate 600+d Plus 600-400 Mg-unit Tabs (Calcium carbonate-vit d-min) .... Take 1 tablet by mouth two times a day 2)  Hydrocodone-acetaminophen 7.5-325 Mg Tabs (Hydrocodone-acetaminophen) .... One by mouth every 6 hours as needed pain 3)  Lorazepam 1 Mg Tabs (Lorazepam) .... Take 1 tablet by mouth once a day as needed 4)  Omeprazole 40 Mg Cpdr (Omeprazole) .... Take 1 tablet by mouth once a day 5)  Gas-x Extra Strength 125 Mg Caps (Simethicone) .... Otc as directed 6)  Flovent Diskus 250 Mcg/blist Aepb (Fluticasone propionate (inhal)) .Marland Kitchen.. 1 puff two times a day  Patient Instructions: 1)  we will do referral to hand doctor and dermatologist at check out  2)  use the wrist splint when needed  3)  we will schedule mammogram at check out    Prescriptions: LORAZEPAM 1 MG TABS (LORAZEPAM) Take 1 tablet by mouth once a day as needed  #30 x 5   Entered and Authorized by:   Judith Part MD   Signed by:   Judith Part MD on 01/09/2011   Method used:   Print then Give to Patient   RxID:   [id number]    Orders Added: 1)  Orthopedic Referral [Ortho] 2)  Radiology Referral [Radiology] 3)  Dermatology Referral [Derma] 4)  Est. Patient Level III [56213]    Current Allergies (reviewed today): ! AUGMENTIN ! * TAPE ! LIPITOR (ATORVASTATIN) ! NEURONTIN ! * ALBUTEROL ! CRESTOR ! VYTORIN ! FOSAMAX ! ACTONEL ! BONIVA ! * DILTIAZEM ! SULFA

## 2011-01-30 ENCOUNTER — Ambulatory Visit: Payer: Self-pay | Admitting: Family Medicine

## 2011-01-30 ENCOUNTER — Encounter: Payer: Self-pay | Admitting: Family Medicine

## 2011-01-30 LAB — HM MAMMOGRAPHY: HM Mammogram: NORMAL

## 2011-02-05 ENCOUNTER — Encounter (INDEPENDENT_AMBULATORY_CARE_PROVIDER_SITE_OTHER): Payer: Self-pay | Admitting: *Deleted

## 2011-02-07 ENCOUNTER — Telehealth: Payer: Self-pay | Admitting: Family Medicine

## 2011-02-13 NOTE — Letter (Signed)
Summary: Results Follow up Letter  Jillian Oconnell  [address] [place], [place] [address]   Phone: [phone]  Fax: [phone]    02/05/2011 MRN: [id number]  [facility] [address] [place], [place]  [address]  Dear Jillian Oconnell,  The following are the results of your recent test(s):  Test         Result    Pap Smear:        Normal _____  Not Normal _____ Comments: ______________________________________________________ Cholesterol: LDL(Bad cholesterol):         Your goal is less than:         HDL (Good cholesterol):       Your goal is more than: Comments:  ______________________________________________________ Mammogram:        Normal __X___  Not Normal _____ Comments: Repeat in 1 year  ___________________________________________________________________ Hemoccult:        Normal _____  Not normal _______ Comments:    _____________________________________________________________________ Other Tests:    We routinely do not discuss normal results over the telephone.  If you desire a copy of the results, or you have any questions about this information we can discuss them at your next office visit.   Sincerely,       Sharilyn Sites for  Dr. Roxy Manns

## 2011-02-13 NOTE — Progress Notes (Signed)
Summary: Hydrocodone refill  Phone Note Refill Request Message from:  [employer] on February 07, 2011 4:39 PM  Refills Requested: Medication #1:  HYDROCODONE-ACETAMINOPHEN 7.5-325 MG TABS one by mouth every 6 hours as needed pain Okay to refill?   Method Requested: Telephone to Pharmacy Initial call taken by: Janee Morn CMA Duncan Dull),  February 07, 2011 4:39 PM  Follow-up for Phone Call        px written on EMR for call in  Follow-up by: Judith Part MD,  February 07, 2011 5:02 PM  Additional Follow-up for Phone Call Additional follow up Details #1::        Rx called in as directed.  Additional Follow-up by: Janee Morn CMA Duncan Dull),  February 08, 2011 9:43 AM    Prescriptions: HYDROCODONE-ACETAMINOPHEN 7.5-325 MG TABS (HYDROCODONE-ACETAMINOPHEN) one by mouth every 6 hours as needed pain  #60 x 1   Entered and Authorized by:   Judith Part MD   Signed by:   Judith Part MD on 02/07/2011   Method used:   Telephoned to ...       [facility] [address] (retail)       [address], [address]       [place], [place]  [address]       Ph: [phone]       Fax: [phone]   RxID:   [id number]

## 2011-02-15 ENCOUNTER — Encounter: Payer: Self-pay | Admitting: Family Medicine

## 2011-02-15 ENCOUNTER — Ambulatory Visit (INDEPENDENT_AMBULATORY_CARE_PROVIDER_SITE_OTHER): Payer: Medicare PPO | Admitting: Family Medicine

## 2011-02-15 DIAGNOSIS — J019 Acute sinusitis, unspecified: Secondary | ICD-10-CM

## 2011-02-22 NOTE — Assessment & Plan Note (Signed)
Summary: COUGH,CONGESTION/CLE   HUMANA   Vital Signs:  Patient profile:   77 year old female Weight:      135.75 pounds O2 Sat:      95 % on Room air Temp:     98.2 degrees F oral Pulse rate:   106 / minute Pulse rhythm:   regular BP sitting:   160 / 90  (left arm) Cuff size:   regular  Vitals Entered By: Selena Batten Dance CMA (AAMA) (February 15, 2011 12:23 PM)  O2 Flow:  Room air  Serial Vital Signs/Assessments:  Time      Position  BP       Pulse  Resp  Temp     By                     160/100                        Jillian Boyden  MD  CC: Cough/congestion/sinus headache   History of Present Illness: CC: terrilble headache, i know it's sinus cause i've had them before.  5d h/o sinus headaches, not feeling well.  + drainage down throat.  Yellow nasal discharge.  Headache frontal and bilateral maxillary.  mild cough.  initially sore throat.    No fever/schills.  No ear pain or tooth pain, myalgias or arthralgias, rashes.  Has had sinus issues in past, sinusitis once.  + asthma, allergies.  usually takes allegra, singulair (too expensive).  has been using flovent for asthma.  BP up today.  attributes to meeting new doc.  previously on cozaar and cardizem but stopped because low.  off for 1 year.  last night 117/74.    Current Medications (verified): 1)  Caltrate 600+d Plus 600-400 Mg-Unit Tabs (Calcium Carbonate-Vit D-Min) .... Take 1 Tablet By Mouth Two Times A Day 2)  Hydrocodone-Acetaminophen 7.5-325 Mg Tabs (Hydrocodone-Acetaminophen) .... One By Mouth Every 6 Hours As Needed Pain 3)  Lorazepam 1 Mg Tabs (Lorazepam) .... Take 1 Tablet By Mouth Once A Day As Needed 4)  Omeprazole 40 Mg Cpdr (Omeprazole) .... Take 1 Tablet By Mouth Once A Day 5)  Gas-X Extra Strength 125 Mg Caps (Simethicone) .... Otc As Directed 6)  Flovent Diskus 250 Mcg/blist Aepb (Fluticasone Propionate (Inhal)) .Marland Kitchen.. 1 Puff Two Times A Day  Allergies: 1)  ! Augmentin 2)  ! * Tape 3)  ! Lipitor  (Atorvastatin) 4)  ! Neurontin 5)  ! * Albuterol 6)  ! Crestor 7)  ! Vytorin 8)  ! Fosamax 9)  ! Actonel 10)  ! Boniva 11)  ! * Diltiazem 12)  ! 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  Shingles- post herpetic neuralgia (pain clinic in past) CAD- mild  IBS  cardiol- ortho--[facility]  GI- Dr Bluford Kaufmann  Social History: Last updated: 05/30/2009 Retired- homemaker  Married- cares for husband who has heart dz  Never Smoked Alcohol use-no Drug use-no Regular exercise-no G3P3 moved here from [place] - 2 years ago to be closer to family  Review of Systems       per HPI  Physical Exam  General:  Well-developed,well-nourished,in no acute distress; alert,appropriate and cooperative throughout examination Head:  normocephalic, atraumatic, and  no abnormalities observed.  + sinus frontal and maxillary tenderness Eyes:  PERRLA, EOMI, no injection Ears:  L TM congested R TM clear no cerumen Nose:  nares clear Mouth:  MMM, no pharyngeal erythema, minimal PNDrip Neck:  supple with full rom and no masses or thyromegally, no JVD or carotid bruit , no LAD Lungs:  Normal respiratory effort, chest expands symmetrically. Lungs are clear to auscultation, no crackles or wheezes. Heart:  Normal rate and regular rhythm. S1 and S2 normal without gallop, murmur, click, rub or other extra sounds. Pulses:  2+ rad pulses, brisk cap refill Extremities:  no pedal edema   Impression & Recommendations:  Problem # 1:  SINUSITIS, ACUTE (ICD-461.9) vs allergic rhinitis exacerbation with temperature change.  if sinusitis, early and mild.  supportive care, INS, nasal saline irrigation.  if not better or  deterioration, fill abx.  red flags to return discussed.  Her updated medication list for this problem includes:    Fluticasone Propionate 50 Mcg/act Susp (Fluticasone propionate) .Marland Kitchen... 2 sprays in each nostril dialy for allergies    Amoxicillin 875 Mg Tabs (Amoxicillin) .Marland Kitchen... Take one twice daily for 10 days  Problem # 2:  HYPERTENSION (ICD-401.9) h/o same.  has been elevated in past but per patient previous BP meds caused hypotension.  yesterday normotensive at home.  rec continue to monitor, if staying elevated f/u with PCP.  pt agrees.  Complete Medication List: 1)  Caltrate 600+d Plus 600-400 Mg-unit Tabs (Calcium carbonate-vit d-min) .... Take 1 tablet by mouth two times a day 2)  Hydrocodone-acetaminophen 7.5-325 Mg Tabs (Hydrocodone-acetaminophen) .... One by mouth every 6 hours as needed pain 3)  Lorazepam 1 Mg Tabs (Lorazepam) .... Take 1 tablet by mouth once a day as needed 4)  Omeprazole 40 Mg Cpdr (Omeprazole) .... Take 1 tablet by mouth once a day 5)  Gas-x Extra Strength 125 Mg Caps (Simethicone) .... Otc as directed 6)  Flovent Diskus 250 Mcg/blist Aepb (Fluticasone propionate (inhal)) .Marland Kitchen.. 1 puff two times a day 7)  Fluticasone Propionate 50 Mcg/act Susp (Fluticasone propionate) .... 2 sprays in each nostril dialy for allergies 8)  Amoxicillin 875 Mg Tabs (Amoxicillin) .... Take one twice daily for 10 days  Patient Instructions: 1)  I think you have flare of alergies, could be early sinus infection. 2)  Restart singulair as well as nasal saline spray.  Start flonase (fluticasone) for inflammation. 3)  simple mucinex with plenty of fluid to help mobilize mucous. 4)  If not better after a few days (or worsening), fill antibiotic. 5)  Good to see you today, call clinic with questions. 6)  Keep eye on blood pressure at home, if staying high (> 140/90) make sooner appointment with Dr. Milinda Antis for follow up. Prescriptions: AMOXICILLIN 875 MG TABS (AMOXICILLIN) take one twice daily  for 10 days  #20 x 0   Entered and Authorized by:   Jillian Boyden  MD   Signed by:   Jillian Boyden  MD on 02/15/2011   Method used:   Print then Give to Patient   RxID:   [id number] FLUTICASONE PROPIONATE 50 MCG/ACT SUSP (FLUTICASONE PROPIONATE) 2 sprays in each nostril dialy for allergies  #1 x 1   Entered and Authorized by:   Jillian Boyden  MD   Signed by:   Jillian Boyden  MD on 02/15/2011   Method used:   Electronically to        [facility] [address] (retail)  [address], [address]       [place], [place]  [address]       Ph: [phone]       Fax: [phone]   RxID:   [id number]    Orders Added: 1)  Est. Patient Level III [84132]    Current Allergies (reviewed today): ! AUGMENTIN ! * TAPE ! LIPITOR (ATORVASTATIN) ! NEURONTIN ! * ALBUTEROL ! CRESTOR ! VYTORIN ! FOSAMAX ! ACTONEL ! BONIVA ! * DILTIAZEM ! SULFA

## 2011-04-10 ENCOUNTER — Other Ambulatory Visit: Payer: Self-pay | Admitting: Family Medicine

## 2011-04-10 DIAGNOSIS — B0229 Other postherpetic nervous system involvement: Secondary | ICD-10-CM

## 2011-04-10 NOTE — Telephone Encounter (Signed)
Medication phoned to [facility] [address] pharmacy as instructed.  

## 2011-04-10 NOTE — Telephone Encounter (Signed)
Px written for call in   

## 2011-05-09 ENCOUNTER — Encounter: Payer: Self-pay | Admitting: Family Medicine

## 2011-05-09 ENCOUNTER — Ambulatory Visit (INDEPENDENT_AMBULATORY_CARE_PROVIDER_SITE_OTHER): Payer: Medicare PPO | Admitting: Family Medicine

## 2011-05-09 DIAGNOSIS — H6980 Other specified disorders of Eustachian tube, unspecified ear: Secondary | ICD-10-CM

## 2011-05-09 NOTE — Progress Notes (Signed)
R ear pain. Some ST.  Going on for a few days. She hears a clicking in her R ear.  No sx on L ear.  No FCNAV.  Minimal cough.  No sputum.  Occ flonase use.    ROS: See HPI.  Otherwise negative.    Meds, vitals, and allergies reviewed.   GEN: nad, alert and oriented HEENT: mucous membranes moist, TM w/o erythema, nasal epithelium injected, OP with cobblestoning that is mild.  No movement on R TM with valsalva. Max sinus minimally ttp on R.  NECK: supple w/o LA CV: rrr. PULM: ctab, no inc wob

## 2011-05-09 NOTE — Patient Instructions (Signed)
I would use the flonase- 2 sprays in each nostril once a day.  Use the flovent 1 puff twice a day (rinse your mouth after using it).  Let me know if you aren't getting better.

## 2011-05-09 NOTE — Assessment & Plan Note (Addendum)
Start back on the flonase and fu prn.  No indication for abx at this time.  Anatomy and function d/w pt.  She understood.

## 2011-05-14 ENCOUNTER — Telehealth: Payer: Self-pay | Admitting: Family Medicine

## 2011-05-14 DIAGNOSIS — M8580 Other specified disorders of bone density and structure, unspecified site: Secondary | ICD-10-CM

## 2011-05-14 DIAGNOSIS — M899 Disorder of bone, unspecified: Secondary | ICD-10-CM

## 2011-05-14 DIAGNOSIS — E7800 Pure hypercholesterolemia, unspecified: Secondary | ICD-10-CM

## 2011-05-14 NOTE — Telephone Encounter (Signed)
Please check with Dr. Milinda Antis about patient switching to me.  Pt appears to be due for labs.  Please call pt about getting a lab visit done.  Thanks.

## 2011-05-15 NOTE — Telephone Encounter (Signed)
Noted  

## 2011-05-15 NOTE — Telephone Encounter (Signed)
I am fine with the switch to Dr Para March

## 2011-05-22 ENCOUNTER — Telehealth: Payer: Self-pay | Admitting: *Deleted

## 2011-05-22 MED ORDER — AMOXICILLIN 875 MG PO TABS: 875.0000 mg | ORAL_TABLET | Freq: Two times a day (BID) | ORAL | Status: AC

## 2011-05-22 NOTE — Telephone Encounter (Signed)
I would take amoxil (she should be able to tolerate this- the augmentin upset her stomach but that shouldn't be a problem with plain amoxil).  Keep using the flonase and let us know if she doesn't get better.  Thanks.  I sent the rx.

## 2011-05-22 NOTE — Telephone Encounter (Signed)
Patient was seen on 05-09-11, and she says that she is not feeling any better. She is still having some cough, throat is sore, ear pain. She is asking if she can get antibiotic called in to [facility] [address].

## 2011-05-22 NOTE — Telephone Encounter (Signed)
Patient notified as instructed by telephone. Was informed by patient that she stopped using the Flonase because it made her nose sore and caused it to bleed. Patient stated that she will let you know if the Amoxil does not help.

## 2011-05-23 NOTE — Telephone Encounter (Signed)
Noted  

## 2011-06-08 ENCOUNTER — Other Ambulatory Visit: Payer: Self-pay | Admitting: Family Medicine

## 2011-06-08 NOTE — Telephone Encounter (Signed)
Please phone in

## 2011-06-08 NOTE — Telephone Encounter (Signed)
Please call in

## 2011-06-08 NOTE — Telephone Encounter (Signed)
Actually signed script then realized PCP is Dr. Para March, will route to him.

## 2011-06-08 NOTE — Telephone Encounter (Signed)
Rx called to pharmacy

## 2011-07-09 ENCOUNTER — Other Ambulatory Visit: Payer: Self-pay | Admitting: Family Medicine

## 2011-07-09 MED ORDER — HYDROCODONE-ACETAMINOPHEN 7.5-325 MG PO TABS: ORAL_TABLET | ORAL | Status: DC

## 2011-07-09 NOTE — Telephone Encounter (Signed)
Please call in and have pt schedule OV with me this fall to go over meds, etc.  Thanks.

## 2011-07-09 NOTE — Telephone Encounter (Signed)
Patient notified as instructed by telephone. Patient will call back and schedule an appointment this fall. Rx called to pharmacy.

## 2011-08-10 ENCOUNTER — Encounter: Payer: Self-pay | Admitting: Family Medicine

## 2011-08-10 ENCOUNTER — Ambulatory Visit (INDEPENDENT_AMBULATORY_CARE_PROVIDER_SITE_OTHER): Payer: Medicare PPO | Admitting: Family Medicine

## 2011-08-10 VITALS — BP 142/94 | HR 86 | Temp 97.5°F | Wt 138.0 lb

## 2011-08-10 DIAGNOSIS — M8580 Other specified disorders of bone density and structure, unspecified site: Secondary | ICD-10-CM

## 2011-08-10 DIAGNOSIS — M949 Disorder of cartilage, unspecified: Secondary | ICD-10-CM

## 2011-08-10 DIAGNOSIS — E7800 Pure hypercholesterolemia, unspecified: Secondary | ICD-10-CM

## 2011-08-10 DIAGNOSIS — E785 Hyperlipidemia, unspecified: Secondary | ICD-10-CM

## 2011-08-10 DIAGNOSIS — M899 Disorder of bone, unspecified: Secondary | ICD-10-CM

## 2011-08-10 DIAGNOSIS — B0229 Other postherpetic nervous system involvement: Secondary | ICD-10-CM

## 2011-08-10 DIAGNOSIS — R300 Dysuria: Secondary | ICD-10-CM

## 2011-08-10 DIAGNOSIS — F411 Generalized anxiety disorder: Secondary | ICD-10-CM

## 2011-08-10 LAB — POCT URINALYSIS DIPSTICK
Bilirubin, UA: NEGATIVE
Ketones, UA: NEGATIVE
Leukocytes, UA: NEGATIVE
Nitrite, UA: NEGATIVE
Protein, UA: NEGATIVE

## 2011-08-10 LAB — COMPREHENSIVE METABOLIC PANEL
ALT: 14 U/L (ref 0–35)
Albumin: 4.1 g/dL (ref 3.5–5.2)
CO2: 31 mEq/L (ref 19–32)
Chloride: 102 mEq/L (ref 96–112)
GFR: 65.77 mL/min (ref >60.00)
Potassium: 4.3 mEq/L (ref 3.5–5.1)
Sodium: 140 mEq/L (ref 135–145)
Total Bilirubin: 0.7 mg/dL (ref 0.3–1.2)
Total Protein: 7.6 g/dL (ref 6.0–8.3)

## 2011-08-10 LAB — LIPID PANEL
Total CHOL/HDL Ratio: 4
Triglycerides: 124 mg/dL (ref 0.0–149.0)

## 2011-08-10 MED ORDER — HYDROCODONE-ACETAMINOPHEN 7.5-325 MG PO TABS: ORAL_TABLET | ORAL | Status: DC

## 2011-08-10 MED ORDER — LORAZEPAM 1 MG PO TABS: 1.0000 mg | ORAL_TABLET | Freq: Every day | ORAL | Status: DC | PRN

## 2011-08-10 MED ORDER — OMEPRAZOLE 40 MG PO CPDR: 40.0000 mg | DELAYED_RELEASE_CAPSULE | Freq: Every day | ORAL | Status: DC

## 2011-08-10 NOTE — Assessment & Plan Note (Signed)
Continue current meds.  No SI/HI.  Okay for outpatient f/u.  She'll call back as needed.  She is trying to cope with a difficult situation.

## 2011-08-10 NOTE — Progress Notes (Signed)
Depressed and anxious.  2 daughters with cancer, (one had thyroid cancer) and both have surgery pending.  Husband with lung cancer.  Taking ativan once a day.  If she doesn't take it, she gets much more anxious and has some palpitations.  Grandson autistic; he and his mother live with the patient after they lost their house.    Shingles pain- vicodin makes it manageable.  Bid dosing.  No ADE.   Pain with urination.  Frequency in urination.  Going on for about 1 week.  Leaking some urine, longstanding.   Elevated Cholesterol: Using medications without problems:no meds Muscle aches:  Statin intolerant Other complaints: due for labs.    Osteopenia.  Due for vit D check.  DXA done 08/2010.  PMH and SH reviewed  ROS: See HPI, otherwise noncontributory.  Meds, vitals, and allergies reviewed.   Nad, speech wnl, judgement intact, nearly tearful but regains composure. rrr ctab abd soft, not ttp, suprapubic area not ttp No cva pain Ext w/o edema

## 2011-08-10 NOTE — Assessment & Plan Note (Signed)
Check ucx and then notify pt.  Nontoxic.

## 2011-08-10 NOTE — Assessment & Plan Note (Signed)
Continue vicodin, tolerated well.

## 2011-08-10 NOTE — Assessment & Plan Note (Signed)
D/w pt, check vit D.  No indication to re-image now.

## 2011-08-10 NOTE — Assessment & Plan Note (Signed)
See notes on labs. 

## 2011-08-10 NOTE — Patient Instructions (Addendum)
Check with your insurance to see if they will cover the shingles shot. I would get a flu shot each fall.   We'll contact you with your lab report. Call me or come in if you have concerns.

## 2011-08-12 LAB — URINE CULTURE: Colony Count: NO GROWTH

## 2011-08-28 ENCOUNTER — Ambulatory Visit (INDEPENDENT_AMBULATORY_CARE_PROVIDER_SITE_OTHER): Payer: Medicare PPO | Admitting: Family Medicine

## 2011-08-28 ENCOUNTER — Encounter: Payer: Self-pay | Admitting: Family Medicine

## 2011-08-28 DIAGNOSIS — J329 Chronic sinusitis, unspecified: Secondary | ICD-10-CM | POA: Insufficient documentation

## 2011-08-28 DIAGNOSIS — J45909 Unspecified asthma, uncomplicated: Secondary | ICD-10-CM

## 2011-08-28 MED ORDER — MONTELUKAST SODIUM 10 MG PO TABS: 10.0000 mg | ORAL_TABLET | Freq: Every day | ORAL | Status: DC

## 2011-08-28 MED ORDER — AMOXICILLIN 875 MG PO TABS: 875.0000 mg | ORAL_TABLET | Freq: Two times a day (BID) | ORAL | Status: AC

## 2011-08-28 NOTE — Progress Notes (Signed)
duration of symptoms: worse since the weekend Rhinorrhea: some congestion:yes ear pain:yes sore throat: no Cough:yes with yellow sputum Myalgias: no other concerns:facial pain  H/o asthma with good relief with singular prev, asking about going back on this. Had been using flovent; intolerant of SABA due to jittery feeling.    ROS: See HPI.  Otherwise negative.    Meds, vitals, and allergies reviewed.   GEN: nad, alert and oriented HEENT: mucous membranes moist, TM w/o erythema, nasal epithelium injected, OP with cobblestoning, frontal and max sinuses ttp B NECK: supple w/o LA CV: rrr. PULM: ctab, no inc wob EXT: no edema

## 2011-08-28 NOTE — Assessment & Plan Note (Signed)
Start amoxil, continue flonase.  Supportive tx o/w.  F/u prn.

## 2011-08-28 NOTE — Assessment & Plan Note (Signed)
She'll check on the pricing of the singulair.  She had done well on ICS too, but wanted to try to switch back.  Will notify me/clinic if she has further sx.

## 2011-08-28 NOTE — Patient Instructions (Signed)
Drink plenty of fluids, take tylenol as needed, and start the amoxil today.  Use the nasal spray.  This should gradually improve.  Take care.  Let us know if you have other concerns.  Check on the price for the singulair.

## 2011-10-16 ENCOUNTER — Encounter: Payer: Self-pay | Admitting: Family Medicine

## 2011-10-16 ENCOUNTER — Ambulatory Visit (INDEPENDENT_AMBULATORY_CARE_PROVIDER_SITE_OTHER): Payer: Medicare PPO | Admitting: Family Medicine

## 2011-10-16 DIAGNOSIS — J329 Chronic sinusitis, unspecified: Secondary | ICD-10-CM

## 2011-10-16 MED ORDER — AMOXICILLIN 875 MG PO TABS: 875.0000 mg | ORAL_TABLET | Freq: Two times a day (BID) | ORAL | Status: AC

## 2011-10-16 NOTE — Patient Instructions (Signed)
Drink plenty of fluids, take tylenol as needed, and gargle with warm salt water for your throat.  This should gradually improve.  Take care.  Let us know if you have other concerns.   Start the amoxil today.  

## 2011-10-16 NOTE — Progress Notes (Signed)
Prev seen in 9/12; that illness resolved.  Since then daughter had to have surgery and this has been tough on the patient.  Recently with cough, hoarse voice and brown sputum production.  Started about 2 weeks ago.  No fevers.  Needing to use flovent and nasal sprays, with unclear benefit so far.  Chest and back are sore from cough.    Husband in observation for cancer treatment.    Meds, vitals, and allergies reviewed.   ROS: See HPI.  Otherwise, noncontributory.  GEN: nad, alert and oriented HEENT: mucous membranes moist, tm w/o erythema, nasal exam w/o erythema, clear discharge noted,  OP with cobblestoning R max and frontal sinus ttp.   NECK: supple w/o LA CV: rrr.   PULM: ctab, no inc wob

## 2011-10-17 NOTE — Assessment & Plan Note (Signed)
Use amoxil given the unilateral tenderness and duration, continue other meds. She agrees.  Nontoxic.  Supportive tx o/w.

## 2011-11-20 ENCOUNTER — Ambulatory Visit: Payer: Medicare PPO

## 2012-01-07 ENCOUNTER — Ambulatory Visit (INDEPENDENT_AMBULATORY_CARE_PROVIDER_SITE_OTHER): Payer: Medicare PPO | Admitting: Family Medicine

## 2012-01-07 ENCOUNTER — Encounter: Payer: Self-pay | Admitting: Family Medicine

## 2012-01-07 DIAGNOSIS — Z1231 Encounter for screening mammogram for malignant neoplasm of breast: Secondary | ICD-10-CM

## 2012-01-07 DIAGNOSIS — I10 Essential (primary) hypertension: Secondary | ICD-10-CM

## 2012-01-07 LAB — BASIC METABOLIC PANEL
BUN: 23 mg/dL (ref 6–23)
Chloride: 101 mEq/L (ref 96–112)
Creatinine, Ser: 1 mg/dL (ref 0.4–1.2)

## 2012-01-07 NOTE — Patient Instructions (Addendum)
See Shirlee Limerick about your referral before you leave today. You can get your results through our phone system.  Follow the instructions on the blue card. I would drink at least 32 oz of water a day and see if that makes a difference.   Let me know if you continue have more episodes.

## 2012-01-07 NOTE — Progress Notes (Signed)
She accidentally bumped her forehead yesterday on the porch while cleaning.  No LOC.  Slightly ttp, better today.    BP was up today.  She's has some low BPs at home, and that was the trouble for her.  Irregular pattern, not every day, intermittent, going on for months.  Not sob, no CP.   Her BP is low after the events.  No syncope.  No cough, diarrhea, fevers.  No edema.  Limited fluid intake.  Minimal salt intake. It isn't getting better or worse.  No BP meds now.   Due for mammogram.  This was ordered today.   Meds, vitals, and allergies reviewed.   ROS: See HPI.  Otherwise, noncontributory.  nad ncat Neck supple, no LA rrr ctab abd soft, not ttp Ext w/o edema Skin turgor is slightly decreased but normal cap refill  EKG reveiwed and compared to prev.

## 2012-01-08 ENCOUNTER — Encounter: Payer: Self-pay | Admitting: Family Medicine

## 2012-01-08 NOTE — Assessment & Plan Note (Signed)
Now with episodic hypotension.  Likely related to minimal fluid intake.  Will check bmet and then she'll inc fluids.  If not improved, she'll notify us.  Not on any BP meds. She agrees with plan.  >25 min spent with face to face with patient, >50% counseling and/or coordinating care.

## 2012-01-24 ENCOUNTER — Other Ambulatory Visit: Payer: Self-pay | Admitting: *Deleted

## 2012-01-24 MED ORDER — OMEPRAZOLE 40 MG PO CPDR: 40.0000 mg | DELAYED_RELEASE_CAPSULE | Freq: Every day | ORAL | Status: DC

## 2012-01-24 NOTE — Telephone Encounter (Signed)
Patient asks that these be sent in to [facility].  If they can't be sent electronically, I suppose I can fax them.

## 2012-01-25 ENCOUNTER — Other Ambulatory Visit: Payer: Self-pay | Admitting: *Deleted

## 2012-01-25 MED ORDER — LORAZEPAM 1 MG PO TABS: 1.0000 mg | ORAL_TABLET | Freq: Every day | ORAL | Status: DC | PRN

## 2012-01-25 MED ORDER — HYDROCODONE-ACETAMINOPHEN 7.5-325 MG PO TABS: ORAL_TABLET | ORAL | Status: DC

## 2012-01-25 NOTE — Telephone Encounter (Signed)
Please fax

## 2012-01-25 NOTE — Telephone Encounter (Signed)
Faxed to [facility].

## 2012-01-29 ENCOUNTER — Ambulatory Visit (INDEPENDENT_AMBULATORY_CARE_PROVIDER_SITE_OTHER): Payer: Medicare PPO | Admitting: Family Medicine

## 2012-01-29 ENCOUNTER — Telehealth: Payer: Self-pay | Admitting: Family Medicine

## 2012-01-29 ENCOUNTER — Encounter: Payer: Self-pay | Admitting: Family Medicine

## 2012-01-29 DIAGNOSIS — J329 Chronic sinusitis, unspecified: Secondary | ICD-10-CM

## 2012-01-29 MED ORDER — AMOXICILLIN 875 MG PO TABS: 875.0000 mg | ORAL_TABLET | Freq: Two times a day (BID) | ORAL | Status: AC

## 2012-01-29 MED ORDER — FLUTICASONE PROPIONATE (INHAL) 250 MCG/BLIST IN AEPB: INHALATION_SPRAY | RESPIRATORY_TRACT | Status: DC

## 2012-01-29 MED ORDER — FLUTICASONE PROPIONATE 50 MCG/ACT NA SUSP: 2.0000 | Freq: Every day | NASAL | Status: DC

## 2012-01-29 NOTE — Telephone Encounter (Signed)
Will see today.  

## 2012-01-29 NOTE — Progress Notes (Signed)
Didn't feel well since Sunday.  She doesn't feel presyncopal.  Pain and tight across forehead.  Pressure in face with bending over. Some cough with wheeze.  Some discolored sputum.  Rhinorrhea, clear.  Ears feel itchy but not painful.  No fevers.  Has a wood stove in home.  Husband is a cancer patient.    Meds, vitals, and allergies reviewed.   ROS: See HPI.  Otherwise, noncontributory.  GEN: nad, alert and oriented HEENT: mucous membranes moist, tm w/o erythema, nasal exam w/o erythema, clear discharge noted,  OP with cobblestoning, frontal and max sinus ttp B NECK: supple w/o LA CV: rrr.   PULM: ctab, no inc wob EXT: no edema SKIN: no acute rash

## 2012-01-29 NOTE — Patient Instructions (Signed)
Start the antibiotics today and get some rest.  Use the nasal spray and the inhaler.  Take care.

## 2012-01-29 NOTE — Telephone Encounter (Signed)
Triage Record Num: [id number] Operator: Revonda Humphrey Patient Name: [facility] Call Date & Time: 01/29/2012 2:10:22PM Patient Phone: [phone] PCP: Crawford Givens Patient Gender: Female PCP Fax : Patient DOB: 05/07/1934 Practice Name: [facility] Reason for Call: Caller: Annissa/Mother; PCP: Crawford Givens Clelia Croft); CB#: [phone]; ; ; Call regarding ?Marland Kitchen Sinus Infection.; Discomfort across forhead, cheek bones starting 2 days ago 01/27/2012, had episode of SOB while hurrying that day. Cough with yellow mucus. Wheezing at intervals. Afebrile. Has been using wood stove to heat home. Appt today 4:15pm Protocol(s) Used: Upper Respiratory Infection (URI) Recommended Outcome per Protocol: See Provider within 24 hours Reason for Outcome: Productive cough with colored sputum (other than clear or white sputum) Care Advice: ~ 01/29/2012 2:29:00PM Page 1 of 1 [id number]

## 2012-01-30 DIAGNOSIS — J329 Chronic sinusitis, unspecified: Secondary | ICD-10-CM | POA: Insufficient documentation

## 2012-01-30 NOTE — Assessment & Plan Note (Signed)
Use flonase and start amoxil.  Supportive tx o/w.  She agrees.  D/w pt about not using wood stove.  Okay for outptaient fu.

## 2012-02-26 ENCOUNTER — Ambulatory Visit: Payer: Self-pay | Admitting: Family Medicine

## 2012-02-27 ENCOUNTER — Encounter: Payer: Self-pay | Admitting: Family Medicine

## 2012-02-28 ENCOUNTER — Encounter: Payer: Self-pay | Admitting: *Deleted

## 2012-05-02 ENCOUNTER — Encounter: Payer: Self-pay | Admitting: Family Medicine

## 2012-05-02 ENCOUNTER — Ambulatory Visit (INDEPENDENT_AMBULATORY_CARE_PROVIDER_SITE_OTHER): Payer: Medicare PPO | Admitting: Family Medicine

## 2012-05-02 VITALS — BP 146/100 | HR 97 | Temp 98.1°F | Wt 136.8 lb

## 2012-05-02 DIAGNOSIS — R300 Dysuria: Secondary | ICD-10-CM

## 2012-05-02 DIAGNOSIS — R350 Frequency of micturition: Secondary | ICD-10-CM

## 2012-05-02 DIAGNOSIS — B0229 Other postherpetic nervous system involvement: Secondary | ICD-10-CM

## 2012-05-02 DIAGNOSIS — IMO0001 Reserved for inherently not codable concepts without codable children: Secondary | ICD-10-CM

## 2012-05-02 DIAGNOSIS — Z23 Encounter for immunization: Secondary | ICD-10-CM

## 2012-05-02 LAB — POCT URINALYSIS DIPSTICK
Blood, UA: NEGATIVE
Nitrite, UA: NEGATIVE
Urobilinogen, UA: NEGATIVE
pH, UA: 7

## 2012-05-02 MED ORDER — MONTELUKAST SODIUM 10 MG PO TABS: 10.0000 mg | ORAL_TABLET | Freq: Every day | ORAL | Status: DC

## 2012-05-02 MED ORDER — CIPROFLOXACIN HCL 250 MG PO TABS: 250.0000 mg | ORAL_TABLET | Freq: Two times a day (BID) | ORAL | Status: AC

## 2012-05-02 MED ORDER — HYDROCODONE-ACETAMINOPHEN 7.5-325 MG PO TABS: ORAL_TABLET | ORAL | Status: DC

## 2012-05-02 NOTE — Patient Instructions (Signed)
Drink plenty of water and start the antibiotics today.  We'll contact you with your lab report.  Take care.   

## 2012-05-02 NOTE — Assessment & Plan Note (Signed)
Deteriorated, will inc the vicodin to q8hours prn.  Discussed with patient today. No ADE from med.

## 2012-05-02 NOTE — Assessment & Plan Note (Signed)
Nontoxic, no cva pain.  Ucx, cipro, fluids and f/u prn.

## 2012-05-02 NOTE — Progress Notes (Signed)
Dysuria: yes, frequency and burning duration of symptoms: a few days abdominal pain:no, other than some gas pains fevers:no back pain: some but this is likely due to prev shingles (this is some better with vicodin, but not fully controlled) Vomiting:no  She is due for a tetanus shot.    Meds, vitals, and allergies reviewed.   ROS: See HPI.  Otherwise negative.    GEN: nad, alert and oriented HEENT: mucous membranes moist NECK: supple CV: rrr.  PULM: ctab, no inc wob ABD: soft, +bs, suprapubic area not tender EXT: no edema SKIN: no acute rash BACK: no CVA pain

## 2012-05-05 ENCOUNTER — Encounter: Payer: Self-pay | Admitting: *Deleted

## 2012-06-09 ENCOUNTER — Encounter: Payer: Self-pay | Admitting: Family Medicine

## 2012-06-09 ENCOUNTER — Ambulatory Visit (INDEPENDENT_AMBULATORY_CARE_PROVIDER_SITE_OTHER)
Admission: RE | Admit: 2012-06-09 | Discharge: 2012-06-09 | Disposition: A | Discharge status: Home / Self Care | Payer: Medicare PPO | Source: Ambulatory Visit | Attending: Family Medicine | Admitting: Family Medicine

## 2012-06-09 ENCOUNTER — Ambulatory Visit (INDEPENDENT_AMBULATORY_CARE_PROVIDER_SITE_OTHER): Payer: Medicare PPO | Admitting: Family Medicine

## 2012-06-09 VITALS — BP 132/90 | HR 88 | Temp 97.7°F | Wt 136.0 lb

## 2012-06-09 DIAGNOSIS — M79609 Pain in unspecified limb: Secondary | ICD-10-CM

## 2012-06-09 DIAGNOSIS — M25559 Pain in unspecified hip: Secondary | ICD-10-CM

## 2012-06-09 DIAGNOSIS — M79606 Pain in leg, unspecified: Secondary | ICD-10-CM

## 2012-06-09 MED ORDER — TRAMADOL HCL 50 MG PO TABS: 50.0000 mg | ORAL_TABLET | Freq: Three times a day (TID) | ORAL | Status: AC | PRN

## 2012-06-09 NOTE — Patient Instructions (Signed)
Take the tramadol for pain and see if that helps your hip.  Use an ice bag and we'll notify you about the xray.

## 2012-06-09 NOTE — Progress Notes (Signed)
R hip and lower back pain.  Going on for years but worse recently.  Pain in certain positions, ie riding in certain cars or sitting in a low chair.  No rash.  No falls, no trauma.  Occ pain radiating into the leg.  All on R side, no similar sx on L side.  No weakness in R leg.  She is still on vicodin after shingles and that helps some but the pain never fully goes away. Known B knee OA.    Meds, vitals, and allergies reviewed.   ROS: See HPI.  Otherwise, noncontributory.  nad ncat Back w/o midline pain rrr ctab abd benign R SI joint tender on testing, R greater troch ttp and R ITB tender on testing.  Normal gait, no weakness in legs.

## 2012-06-09 NOTE — Assessment & Plan Note (Signed)
With unremarkable xray, no acute changes; no pain on internal rotation of hip.  Likely combination of findings- SI, greater troch and ITB. D/wpt about tramadol use, ice and will follow clinically.  She agrees.

## 2012-06-24 ENCOUNTER — Other Ambulatory Visit: Payer: Self-pay | Admitting: Family Medicine

## 2012-06-24 NOTE — Telephone Encounter (Signed)
Received refill request electronically from pharmacy. Is it okay to refill medication? 

## 2012-06-25 MED ORDER — LORAZEPAM 1 MG PO TABS: ORAL_TABLET | ORAL | Status: DC

## 2012-06-25 MED ORDER — HYDROCODONE-ACETAMINOPHEN 7.5-325 MG PO TABS: ORAL_TABLET | ORAL | Status: DC

## 2012-06-25 NOTE — Telephone Encounter (Signed)
These need to be printed and faxed to the pharmacy since this is a mail order pharmacy.

## 2012-06-25 NOTE — Telephone Encounter (Signed)
Please call in.  Thanks.   

## 2012-06-25 NOTE — Telephone Encounter (Signed)
Printed, thanks

## 2012-06-25 NOTE — Telephone Encounter (Signed)
Rx's faxed to [facility] as instructed.

## 2012-08-25 ENCOUNTER — Encounter: Payer: Self-pay | Admitting: Family Medicine

## 2012-08-25 ENCOUNTER — Ambulatory Visit (INDEPENDENT_AMBULATORY_CARE_PROVIDER_SITE_OTHER): Payer: Medicare PPO | Admitting: Family Medicine

## 2012-08-25 VITALS — BP 126/86 | HR 98 | Temp 97.7°F | Wt 135.0 lb

## 2012-08-25 DIAGNOSIS — J329 Chronic sinusitis, unspecified: Secondary | ICD-10-CM

## 2012-08-25 DIAGNOSIS — J45909 Unspecified asthma, uncomplicated: Secondary | ICD-10-CM

## 2012-08-25 MED ORDER — AMOXICILLIN 875 MG PO TABS: 875.0000 mg | ORAL_TABLET | Freq: Two times a day (BID) | ORAL | Status: AC

## 2012-08-25 MED ORDER — ALBUTEROL SULFATE HFA 108 (90 BASE) MCG/ACT IN AERS: 1.0000 | INHALATION_SPRAY | Freq: Four times a day (QID) | RESPIRATORY_TRACT | Status: DC | PRN

## 2012-08-25 MED ORDER — FLUTICASONE PROPIONATE (INHAL) 250 MCG/BLIST IN AEPB: INHALATION_SPRAY | RESPIRATORY_TRACT | Status: DC

## 2012-08-25 NOTE — Assessment & Plan Note (Signed)
CTAB today.  Add back flovent, use SABA if SOB (1 puff to limit jittery feeling PRN).  She agrees. Nontoxic.  Continue singulair.

## 2012-08-25 NOTE — Patient Instructions (Addendum)
Start back on the flonvent and rinse after use.  Use the albuterol if needed.  Start the amoxil today.  Take care.

## 2012-08-25 NOTE — Progress Notes (Signed)
Had an asthma attack about 1 week ago.  Dyspnea yesterday, better today.  Had been taking singular.  Fall is the worst time of year for her.  She was prev on allergy shots.  She has trouble tolerating albuterol.  She had used flovent prev, but not currently.    Recently with B sinus pain and discolored nasal discharge.    Meds, vitals, and allergies reviewed.   ROS: See HPI.  Otherwise, noncontributory.  GEN: nad, alert and oriented HEENT: mucous membranes moist, tm w/o erythema, nasal exam w/o erythema, discharge noted,  OP with cobblestoning, max and frontal sinuses ttp B NECK: supple w/o LA CV: rrr.   PULM: ctab, no inc wob EXT: no edema SKIN: no acute rash

## 2012-08-25 NOTE — Assessment & Plan Note (Signed)
Use flonase and add on amoxil, f/u prn.  She agrees.  Nontoxic.

## 2012-10-10 ENCOUNTER — Ambulatory Visit (INDEPENDENT_AMBULATORY_CARE_PROVIDER_SITE_OTHER): Payer: Medicare PPO | Admitting: Family Medicine

## 2012-10-10 ENCOUNTER — Encounter: Payer: Self-pay | Admitting: Family Medicine

## 2012-10-10 VITALS — BP 142/94 | HR 99 | Temp 97.6°F | Wt 132.0 lb

## 2012-10-10 DIAGNOSIS — M549 Dorsalgia, unspecified: Secondary | ICD-10-CM

## 2012-10-10 DIAGNOSIS — F411 Generalized anxiety disorder: Secondary | ICD-10-CM

## 2012-10-10 DIAGNOSIS — K219 Gastro-esophageal reflux disease without esophagitis: Secondary | ICD-10-CM

## 2012-10-10 MED ORDER — LANSOPRAZOLE 30 MG PO CPDR: 30.0000 mg | DELAYED_RELEASE_CAPSULE | Freq: Every day | ORAL | Status: DC

## 2012-10-10 NOTE — Patient Instructions (Signed)
Switch to prevacid.  If the insurance won't cover it, then have the pharmacy contact the clinic.  Let me know what other medicine you had previously taken and had some relief. Use the heating pad on your back.  Take care.

## 2012-10-10 NOTE — Progress Notes (Signed)
Stomach sx, episodic burning pain in epigastrum and she has the feeling of food sticking (usually meats) near epigastrum- never in OP or upper chest.  Going on for a few weeks.  She tried probiotics and she didn't know if that was related.  No relief from prilosec but prevacid used to help much more.  Prev with EGD years ago, but didn't require dilation.  No vomiting.  No blood in stool.  Dec in appetite.  She can eat eggs and waffles, but doesn't tolerate a lot of other foods due to bloated feeling.    Sx are worse when she is nervous.  Husband has cancer, other relatives are ill too.  She is frequently tearful.  She was prev treated with an unrecalled medicine that helped, she'll check with family members about this.  She would like to restart.   R back pain, medial to R scap and worse with certain movement.  No trauma "but I've worked my whole life."  No pain radiating down the R arm.  No weakness.   Meds, vitals, and allergies reviewed.   ROS: See HPI.  Otherwise, noncontributory.  Nad, but occ tearful Mmm rrr ctab Neck supple, no LA abd soft, minimally ttp in the epigastrum but not in L/RUQ or L/RLQ.  No rebound.  Back with muscle spasm noted medial to R scap. No R arm drop.  Speech wnl.

## 2012-10-12 DIAGNOSIS — M549 Dorsalgia, unspecified: Secondary | ICD-10-CM | POA: Insufficient documentation

## 2012-10-12 NOTE — Assessment & Plan Note (Signed)
She'll use heat on the area and f/u prn.

## 2012-10-12 NOTE — Assessment & Plan Note (Signed)
She'll check on the medicine she has used prev and will contact the clinic. Okay for outpatient f/u in the meantime.  She agrees.

## 2012-10-12 NOTE — Assessment & Plan Note (Addendum)
Now with recurrent sx.  Will change back to prevacid as this was more effective for her.  She may have LES stenosis/stricture but she'd like to avoid EGD for now if possible.  Is reasonable to try alternate PPI first and if not improved with refer to GI.  She agrees. >25 min spent with face to face with patient, >50% counseling and/or coordinating care.

## 2012-10-31 ENCOUNTER — Other Ambulatory Visit: Payer: Self-pay | Admitting: Family Medicine

## 2012-10-31 MED ORDER — HYDROCODONE-ACETAMINOPHEN 7.5-325 MG PO TABS: ORAL_TABLET | ORAL | Status: DC

## 2012-10-31 MED ORDER — LORAZEPAM 1 MG PO TABS: ORAL_TABLET | ORAL | Status: DC

## 2012-10-31 NOTE — Telephone Encounter (Signed)
Jillian Oconnell is calling to ask if Dr Para March will authorize refills of her medications. She usually gets them filled through a mail order company but says she did not realize she didn't get refills at her recent office visit and now it's too late to get them there. Requests refills be sent to [facility] [address]. She needs refill of Hydrocodone 7-325 and Lorazepam 1mg . Rn advised may take up to 48 hrs for refills. She says she will be fine until then.  [facility] [address] [phone]

## 2012-10-31 NOTE — Telephone Encounter (Signed)
Medication phoned to pharmacy.  

## 2012-10-31 NOTE — Telephone Encounter (Signed)
Please call in.  Thanks.   

## 2012-11-20 ENCOUNTER — Encounter: Payer: Self-pay | Admitting: Family Medicine

## 2013-01-06 ENCOUNTER — Ambulatory Visit (INDEPENDENT_AMBULATORY_CARE_PROVIDER_SITE_OTHER): Payer: Medicare PPO | Admitting: Family Medicine

## 2013-01-06 ENCOUNTER — Encounter: Payer: Self-pay | Admitting: Family Medicine

## 2013-01-06 ENCOUNTER — Encounter: Payer: Self-pay | Admitting: *Deleted

## 2013-01-06 VITALS — BP 136/90 | HR 92 | Temp 98.2°F | Wt 134.8 lb

## 2013-01-06 DIAGNOSIS — F411 Generalized anxiety disorder: Secondary | ICD-10-CM

## 2013-01-06 DIAGNOSIS — M79609 Pain in unspecified limb: Secondary | ICD-10-CM

## 2013-01-06 DIAGNOSIS — I10 Essential (primary) hypertension: Secondary | ICD-10-CM

## 2013-01-06 DIAGNOSIS — M79606 Pain in leg, unspecified: Secondary | ICD-10-CM

## 2013-01-06 DIAGNOSIS — J329 Chronic sinusitis, unspecified: Secondary | ICD-10-CM

## 2013-01-06 DIAGNOSIS — R079 Chest pain, unspecified: Secondary | ICD-10-CM

## 2013-01-06 LAB — LDL CHOLESTEROL, DIRECT: Direct LDL: 148.1 mg/dL

## 2013-01-06 LAB — CBC WITH DIFFERENTIAL/PLATELET
Basophils Absolute: 0.1 10*3/uL (ref 0.0–0.1)
Eosinophils Absolute: 0.4 10*3/uL (ref 0.0–0.7)
Lymphocytes Relative: 33.6 % (ref 12.0–46.0)
MCHC: 33.3 g/dL (ref 30.0–36.0)
Monocytes Relative: 9.5 % (ref 3.0–12.0)
Neutro Abs: 2.9 10*3/uL (ref 1.4–7.7)
Neutrophils Relative %: 49.6 % (ref 43.0–77.0)
Platelets: 242 10*3/uL (ref 150.0–400.0)
RDW: 13.8 % (ref 11.5–14.6)

## 2013-01-06 LAB — COMPREHENSIVE METABOLIC PANEL
ALT: 14 U/L (ref 0–35)
AST: 20 U/L (ref 0–37)
Calcium: 9 mg/dL (ref 8.4–10.5)
Chloride: 103 mEq/L (ref 96–112)
Creatinine, Ser: 1 mg/dL (ref 0.4–1.2)
Potassium: 4.6 mEq/L (ref 3.5–5.1)
Sodium: 137 mEq/L (ref 135–145)
Total Protein: 7.4 g/dL (ref 6.0–8.3)

## 2013-01-06 MED ORDER — DOXYCYCLINE HYCLATE 100 MG PO TABS: 100.0000 mg | ORAL_TABLET | Freq: Two times a day (BID) | ORAL | Status: DC

## 2013-01-06 MED ORDER — CITALOPRAM HYDROBROMIDE 20 MG PO TABS: 20.0000 mg | ORAL_TABLET | Freq: Every day | ORAL | Status: DC

## 2013-01-06 MED ORDER — HYDROCODONE-ACETAMINOPHEN 7.5-325 MG PO TABS: ORAL_TABLET | ORAL | Status: DC

## 2013-01-06 NOTE — Assessment & Plan Note (Signed)
Start citalopram as this should be equivalent and f/u prn.  D/w pt about typical timeline for relief with SSRI.  She can gradually taper the BZD.  She agrees.  Will call back if needed.

## 2013-01-06 NOTE — Patient Instructions (Addendum)
Go to the lab on the way out.  We'll contact you with your lab report.  See Shirlee Limerick about your referral before you leave today. Start the citalopram today.  It should have some effect over the next month. If it doesn't help, then let me know. You should gradually be able to wean off the ativan.  Start the doxycycline today for the sinus infection.   Take care.

## 2013-01-06 NOTE — Assessment & Plan Note (Signed)
This is chronic for her and I would use the 3rd dose of vicodin per day as needed.  She isn't sedated and has not ADE on the meds.

## 2013-01-06 NOTE — Assessment & Plan Note (Signed)
Would start doxy and f/u prn. She agrees.  Nontoxic.

## 2013-01-06 NOTE — Progress Notes (Signed)
She continues to have hip pain.  She'll occ need a 3rd dose of the vicodin per day and she is able to get by with that.  She didn't have ADE from the medicine.   She had occ chest pain/pressure that is exertional.  She has exertional SOB (ie carrying something, pushing a cart), but this and the CP isn't always consistent.  She had a heart cath in the distant past w/o obstructive disease.  He has 'gas pain' and she didn't know how much she could attribute to that.  She does do some better with gas x. No h/o MI, CABG, etc.   She has been compliant with her inhaled steroids. CP free currently.   She continues to have tearfulness and prev did well on lexapro.  She would like to get off ativan.  We talked about options.  She has a high stress situation at home, husband with cancer.   Sinus pressure and uri sx:  duration of symptoms: a few days rhinorrhea:yes congestion:yes ear pain: on R side sore throat: yes, resolved now Cough: some in AM, mainly from post nasal gtt R sided facial pain noted recently.   Still on flonase.  All of this seems to be nonacute, but worse recently.    ROS: See HPI.  Otherwise negative.    Meds, vitals, and allergies reviewed.   GEN: nad, alert and oriented HEENT: mucous membranes moist, TM w/o erythema, nasal epithelium injected, OP with cobblestoning, R max and frontal sinuses ttp NECK: supple w/o LA CV: rrr. PULM: ctab, no inc wob ABD: soft, +bs EXT: no edema  EKG reviewed and w/o change.

## 2013-01-06 NOTE — Assessment & Plan Note (Signed)
This could be due to anxiety, GI sx, but given the history I would prefer that she see cards.  See notes on labs. EKG unchanged from prev.  She agrees with cards referral.

## 2013-01-14 ENCOUNTER — Telehealth: Payer: Self-pay | Admitting: Family Medicine

## 2013-01-14 MED ORDER — PREDNISONE 5 MG PO KIT: PACK | ORAL | Status: DC

## 2013-01-14 NOTE — Telephone Encounter (Signed)
Patient Information:  Caller Name: Joene  Phone: [phone]  Patient: Jillian Oconnell  Gender: Female  DOB: Apr 01, 1934  Age: 79 Years  PCP: Crawford Givens Clelia Croft) [facility])  Office Follow Up:  Does the office need to follow up with this patient?: Yes  Instructions For The Office: Requesting Prednisone Dose pack be called in to [facility] -[phone]   Symptoms  Reason For Call & Symptoms: Philana states she was seen in office on 01/06/13 and diagnosed with sinus infection. Had numbness to face - forehead and cheek which has not improved. Intermittent yellow -green nasal discharge. Was ordered Doxcycline with no improvement noted. Face still numb.  Intermittent forehead and right cheek pain. Still having yellow green nasal discharge.Has go to office no disposition due to severe sinus pain. Requesting Oconnell Prednisone dose pack be called in to [facility] [phone].  Reviewed Health History In EMR: Yes  Reviewed Medications In EMR: Yes  Reviewed Allergies In EMR: Yes  Reviewed Surgeries / Procedures: Yes  Date of Onset of Symptoms: 01/06/2013  Treatments Tried: Doxycycline  Treatments Tried Worked: No  Guideline(s) Used:  Face Pain  Headache  Sinus Pain and Congestion  Disposition Per Guideline:   Go to Office Now  Reason For Disposition Reached:   Severe sinus pain  Advice Given:  N/Oconnell  Patient Refused Recommendation:  Patient Requests Prescription  Requesting Oconnell Prednisone dose pack be called  to [facility] [phone]

## 2013-01-14 NOTE — Telephone Encounter (Signed)
pred pack sent. F/u if not improved.  Thanks.

## 2013-01-14 NOTE — Telephone Encounter (Signed)
Spoke with patient and advised results, advised to call if anything changes

## 2013-01-15 ENCOUNTER — Ambulatory Visit: Payer: Medicare PPO | Admitting: Cardiovascular Disease

## 2013-03-24 ENCOUNTER — Other Ambulatory Visit: Payer: Self-pay

## 2013-03-24 NOTE — Telephone Encounter (Signed)
Pt left v/m requesting refill lorazepam to [facility] [address].Please advise. Pt request call back when filled.

## 2013-03-25 MED ORDER — LORAZEPAM 1 MG PO TABS: ORAL_TABLET | ORAL | Status: DC

## 2013-03-25 NOTE — Telephone Encounter (Signed)
Phoned in to [facility]. 

## 2013-03-25 NOTE — Telephone Encounter (Signed)
Please call in.  Thanks.   

## 2013-04-15 ENCOUNTER — Ambulatory Visit: Payer: Self-pay | Admitting: Family Medicine

## 2013-04-16 ENCOUNTER — Encounter: Payer: Self-pay | Admitting: Family Medicine

## 2013-04-29 ENCOUNTER — Other Ambulatory Visit: Payer: Self-pay | Admitting: *Deleted

## 2013-04-29 MED ORDER — FLUTICASONE PROPIONATE 50 MCG/ACT NA SUSP: 2.0000 | Freq: Every day | NASAL | Status: DC

## 2013-05-01 ENCOUNTER — Ambulatory Visit (INDEPENDENT_AMBULATORY_CARE_PROVIDER_SITE_OTHER): Payer: Medicare PPO | Admitting: Family Medicine

## 2013-05-01 ENCOUNTER — Encounter: Payer: Self-pay | Admitting: Family Medicine

## 2013-05-01 VITALS — BP 160/86 | HR 92 | Temp 97.6°F | Wt 135.0 lb

## 2013-05-01 DIAGNOSIS — F411 Generalized anxiety disorder: Secondary | ICD-10-CM

## 2013-05-01 DIAGNOSIS — J329 Chronic sinusitis, unspecified: Secondary | ICD-10-CM

## 2013-05-01 DIAGNOSIS — B0229 Other postherpetic nervous system involvement: Secondary | ICD-10-CM

## 2013-05-01 MED ORDER — AMOXICILLIN 875 MG PO TABS: 875.0000 mg | ORAL_TABLET | Freq: Two times a day (BID) | ORAL | Status: DC

## 2013-05-01 MED ORDER — ESCITALOPRAM OXALATE 10 MG PO TABS: 10.0000 mg | ORAL_TABLET | Freq: Every day | ORAL | Status: DC

## 2013-05-01 MED ORDER — HYDROCODONE-ACETAMINOPHEN 7.5-325 MG PO TABS: ORAL_TABLET | ORAL | Status: DC

## 2013-05-01 NOTE — Progress Notes (Signed)
Weak, fatigue, aches, sweats.  Rhinorrhea, discolored- yellow.  Postnasal gtt.  Facial pain.  Going on for about 1 week.    Intolerant of citalopram, prev tolerated lexapro.  Husband with stage 4 lung CA.  She is worried about him and his condition. Mult other family members with cancer recently.    Chronic hip, knee and postshingles pain. Knees prev injected w/o sig relief.  No acute changes, no trauma.  She continues to have some pain that is variable on the L side of the trunk, from prev shingles, burning pain. L hip pain continues, esp with walking.  Cannot go up steps quickly, will have to bring both feet to the same step before proceeding. No ADE from the medicine. Some relief of pain with meds.   Meds, vitals, and allergies reviewed.   ROS: See HPI.  Otherwise, noncontributory.  GEN: nad, alert and oriented HEENT: mucous membranes moist, tm w/o erythema, nasal exam w/o erythema, clear discharge noted,  OP with cobblestoning, sinuses ttp x4 NECK: supple w/o LA CV: rrr.   PULM: ctab, no inc wob EXT: no edema SKIN: no acute rash

## 2013-05-01 NOTE — Patient Instructions (Addendum)
Check the price on the lexapro.  Start the amoxil today and let me know if you don't improve.  Take care. Glad to see you.

## 2013-05-03 NOTE — Assessment & Plan Note (Signed)
Continue current meds. Chronic hip, knee and postshingles pain. Knees prev injected w/o sig relief.  No acute changes, no trauma.  She continues to have some pain that is variable on the L side of the trunk, from prev shingles, burning pain. L hip pain continues, esp with walking.  Cannot go up steps quickly, will have to bring both feet to the same step before proceeding. No ADE from the medicine. Some relief of pain with meds.

## 2013-05-03 NOTE — Assessment & Plan Note (Signed)
Start amoxil and f/u prn.  Nontoxic.

## 2013-05-03 NOTE — Assessment & Plan Note (Addendum)
Restart lexapro and she'll notify us prn. She didn't tolerated citalopram as a substitution.  She did have some relief with lexapro prev.  We discussed her home stressors.  >25 min spent with face to face with patient, >50% counseling and/or coordinating care

## 2013-07-03 ENCOUNTER — Other Ambulatory Visit: Payer: Self-pay | Admitting: Family Medicine

## 2013-07-03 NOTE — Telephone Encounter (Signed)
Electronic refill request.  Please advise. 

## 2013-07-05 NOTE — Telephone Encounter (Signed)
Please call in.  Thanks.   

## 2013-07-06 ENCOUNTER — Other Ambulatory Visit: Payer: Self-pay | Admitting: Family Medicine

## 2013-07-06 NOTE — Telephone Encounter (Signed)
Medication phoned to pharmacy.  

## 2013-07-27 ENCOUNTER — Other Ambulatory Visit: Payer: Self-pay | Admitting: Family Medicine

## 2013-07-27 NOTE — Telephone Encounter (Signed)
Received refill request electronically. Last office visit 05/01/13. Is it okay to refill medication?

## 2013-07-27 NOTE — Telephone Encounter (Signed)
Please call in

## 2013-07-28 ENCOUNTER — Other Ambulatory Visit: Payer: Self-pay | Admitting: Family Medicine

## 2013-07-28 NOTE — Telephone Encounter (Signed)
Advised pharmacy this was done yesterday.  They checked and it is ready for pt to pick up.

## 2013-07-28 NOTE — Telephone Encounter (Signed)
Rx called to pharmacy as instructed. 

## 2013-07-28 NOTE — Telephone Encounter (Signed)
See note from yesterday.  Thanks.

## 2013-07-30 ENCOUNTER — Encounter: Payer: Self-pay | Admitting: Radiology

## 2013-07-31 ENCOUNTER — Ambulatory Visit (INDEPENDENT_AMBULATORY_CARE_PROVIDER_SITE_OTHER): Payer: Medicare PPO | Admitting: Family Medicine

## 2013-07-31 ENCOUNTER — Encounter: Payer: Self-pay | Admitting: Family Medicine

## 2013-07-31 VITALS — BP 162/96 | HR 96 | Temp 97.4°F | Wt 133.5 lb

## 2013-07-31 DIAGNOSIS — R0789 Other chest pain: Secondary | ICD-10-CM

## 2013-07-31 DIAGNOSIS — F411 Generalized anxiety disorder: Secondary | ICD-10-CM

## 2013-07-31 NOTE — Progress Notes (Signed)
Anxiety. Intolerant of lexapro.  Ativan helps.  No ADE.  Husband on hospice for lung CA.   With episodes of sweats and atypical CP.  She had to cancel the cards eval prev, she and family would like to set up again.  No syncope. No CP now.  Not SOB.  No typical exertional sx now. This has been going on since 1/14, see prev note.   Meds, vitals, and allergies reviewed.   ROS: See HPI.  Otherwise, noncontributory.  nad but worried appearing ncat Mmm rrr ctab abd soft Ext w/o edema

## 2013-07-31 NOTE — Patient Instructions (Addendum)
See Shirlee Limerick about your referral before you leave today. Keep taking the lorazepam and let me know if that isn't helping.  Take care.

## 2013-08-02 NOTE — Assessment & Plan Note (Signed)
Likely exacerbated by husband's illness, with atypical chest pain that continues and she was asking about cards referral.  We had tried to set this up prev.  She had BP controlled prev and is statin intolerant.  Would continue current meds and rerefer to cards.  She agrees with the plan.  Noted that she had heart cath in the distant past w/o obstructive disease.

## 2013-08-03 ENCOUNTER — Encounter: Payer: Self-pay | Admitting: *Deleted

## 2013-08-04 ENCOUNTER — Ambulatory Visit (INDEPENDENT_AMBULATORY_CARE_PROVIDER_SITE_OTHER): Payer: Medicare PPO | Admitting: Cardiovascular Disease

## 2013-08-04 ENCOUNTER — Encounter: Payer: Self-pay | Admitting: Cardiovascular Disease

## 2013-08-04 VITALS — BP 150/106 | HR 107 | Ht <= 58 in | Wt 134.2 lb

## 2013-08-04 DIAGNOSIS — R0789 Other chest pain: Secondary | ICD-10-CM

## 2013-08-04 DIAGNOSIS — R0600 Dyspnea, unspecified: Secondary | ICD-10-CM

## 2013-08-04 DIAGNOSIS — R002 Palpitations: Secondary | ICD-10-CM

## 2013-08-04 DIAGNOSIS — R079 Chest pain, unspecified: Secondary | ICD-10-CM

## 2013-08-04 DIAGNOSIS — R0609 Other forms of dyspnea: Secondary | ICD-10-CM

## 2013-08-04 MED ORDER — METOPROLOL TARTRATE 25 MG PO TABS: 25.0000 mg | ORAL_TABLET | Freq: Two times a day (BID) | ORAL | Status: DC

## 2013-08-04 NOTE — Assessment & Plan Note (Signed)
Based on her history, I suspect that this is likely related to stress and anxiety. Previous cardiac catheterization in 2009 showed no significant coronary artery disease. She had associated dyspnea, dizziness and sweating with this. It is possible that the tachycardia is contributing to her symptoms as well. I suggest treating her tachycardia and reevaluating her symptoms. If her symptoms persist, a stress test can be considered. I will reevaluate this upon followup

## 2013-08-04 NOTE — Progress Notes (Signed)
HPI  This is a 79 year old female who was referred by Dr. Para March for evaluation of chest pain. She suffers from anxiety and has been dealing with extreme stress related to terminal illness of her husband who is currently under hospice care due to lung cancer. She has known history of hyperlipidemia with no diabetes, hypertension or tobacco use. She was exposed to secondhand smoking from her husband. She was referred to me in 2009 for chest pain and an abnormal stress test. I performed cardiac catheterization in February of 2009 which showed minor luminal irregularities with no evidence of obstructive disease. Ejection fraction was normal. She was noted at that time to have significant sinus tachycardia with frequent PACs. She was started on metoprolol but it appears that she stopped taking the medication. She has been having episodes of palpitations, dizziness, sweating and occasional chest discomfort. This happens when she is rushing to do something.  Allergies  Allergen Reactions  . Albuterol     REACTION: jittery  . Alendronate Sodium     REACTION: GI side eff  . Amoxicillin-Pot Clavulanate     REACTION: non tolerant- but can take plain amox  . Atorvastatin   . Doxycycline     diarrhea  . Ezetimibe-Simvastatin     REACTION: myalgia  . Gabapentin     REACTION: tremor  . Ibandronate Sodium     REACTION: GI side eff  . Lexapro [Escitalopram Oxalate]     sedation  . Risedronate Sodium     REACTION: GI side eff  . Rosuvastatin     REACTION: myalgia  . Sulfonamide Derivatives     REACTION: non tolerate     Current Outpatient Prescriptions on File Prior to Visit  Medication Sig Dispense Refill  . albuterol (PROVENTIL HFA;VENTOLIN HFA) 108 (90 BASE) MCG/ACT inhaler Inhale 1 puff into the lungs every 6 (six) hours as needed for wheezing.  1 Inhaler  3  . Calcium Carbonate-Vitamin D (CALTRATE 600+D) 600-400 MG-UNIT per tablet Take 1 tablet by mouth two times a day      .  fluticasone (FLONASE) 50 MCG/ACT nasal spray Place 2 sprays into the nose daily.  16 g  5  . HYDROcodone-acetaminophen (NORCO) 7.5-325 MG per tablet TAKE ONE TABLET BY MOUTH EVERY 8 HOURS AS NEEDED FOR PAIN  270 tablet  0  . lansoprazole (PREVACID) 30 MG capsule Take 1 capsule (30 mg total) by mouth daily.  30 capsule  12  . LORazepam (ATIVAN) 1 MG tablet TAKE ONE TABLET BY MOUTH AS NEEDED, MAX OF ONE PER DAY, THIS IS A 90 DAY SUPPLY  90 tablet  1  . montelukast (SINGULAIR) 10 MG tablet Take 1 tablet (10 mg total) by mouth at bedtime.  30 tablet  12  . Simethicone (GAS-X EXTRA STRENGTH) 125 MG CAPS Take by mouth as needed.        No current facility-administered medications on file prior to visit.     Past Medical History  Diagnosis Date  . Allergy   . Asthma   . GERD (gastroesophageal reflux disease)   . Depression   . Palpitations   . Hyperlipidemia   . Hypertension   . OA (osteoarthritis) of knee     injections  . Osteopenia     DXA 2011  . Glaucoma   . 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 Shingles     post herpetic neuralgia (  pain clinic in past)  . IBS (irritable bowel syndrome)   . Anxiety      Past Surgical History  Procedure Laterality Date  . Gallbladder surgery  1997  . Breast biopsy  1988  . Rectocele repair  2004  . Total abdominal hysterectomy    . Cardiac catheterization  01/2008    65% ;[facility]. Minor luminal irregularities with no evidence of obstructive disease.     Family History  Problem Relation Age of Onset  . Cancer Mother     breast  . Hypertension Mother   . Cancer Sister     colon  . Cancer Daughter     thyroid  . Anxiety disorder Daughter   . Cancer Daughter     breast cancer  . Hypertension Father   . Aneurysm Father     AAA     History   Social History  . Marital Status: Married    Spouse Name: N/A    Number of Children: N/A  . Years of Education: N/A   Occupational History  . Retired      Futures trader   Social History Main Topics  . Smoking status: Never Smoker   . Smokeless tobacco: Not on file  . Alcohol Use: No  . Drug Use: No  . Sexual Activity: Not on file   Other Topics Concern  . Not on file   Social History Narrative   Regular exercise- no       Moved here from [facility]- to be closer to family    Husband with stage IV lung CA as of 2012     ROS A 10 point review of system was performed. Negative other than what is mentioned in history of present illness.  PHYSICAL EXAM   BP 150/106  Pulse 107  Ht 4\' 10"  (1.473 m)  Wt 134 lb 4 oz (60.895 kg)  BMI 28.07 kg/m2 Constitutional: She is oriented to person, place, and time. She appears well-developed and well-nourished. No distress.  HENT: No nasal discharge.  Head: Normocephalic and atraumatic.  Eyes: Pupils are equal and round. Right eye exhibits no discharge. Left eye exhibits no discharge.  Neck: Normal range of motion. Neck supple. No JVD present. No thyromegaly present.  Cardiovascular: Normal rate, regular rhythm, normal heart sounds. Exam reveals no gallop and no friction rub. No murmur heard.  Pulmonary/Chest: Effort normal and breath sounds normal. No stridor. No respiratory distress. She has no wheezes. She has no rales. She exhibits no tenderness.  Abdominal: Soft. Bowel sounds are normal. She exhibits no distension. There is no tenderness. There is no rebound and no guarding.  Musculoskeletal: Normal range of motion. She exhibits no edema and no tenderness.  Neurological: She is alert and oriented to person, place, and time. Coordination normal.  Skin: Skin is warm and dry. No rash noted. She is not diaphoretic. No erythema. No pallor.  Psychiatric: She has a normal mood and affect. Her behavior is normal. Judgment and thought content normal.     ONG:EXBMW  Tachycardia  - occasional ectopic ventricular beat    WITHIN NORMAL LIMITS   ASSESSMENT AND PLAN

## 2013-08-04 NOTE — Assessment & Plan Note (Signed)
She is known to have history of sinus tachycardia as well as premature beats. This seems to be worsened by anxiety. Due to her symptoms, I think she might benefit from treatment with a beta blocker. Due to that, I started her on metoprolol 25 mg twice daily. I will also obtain an echocardiogram to ensure no structural abnormalities or undiagnosed cardiomyopathy.

## 2013-08-04 NOTE — Patient Instructions (Addendum)
Your physician has requested that you have an echocardiogram. Echocardiography is a painless test that uses sound waves to create images of your heart. It provides your doctor with information about the size and shape of your heart and how well your heart's chambers and valves are working. This procedure takes approximately one hour. There are no restrictions for this procedure.  Start Metoprolol 25 mg twice daily.   Follow up after echo

## 2013-08-19 ENCOUNTER — Telehealth: Payer: Self-pay

## 2013-08-19 MED ORDER — AZITHROMYCIN 250 MG PO TABS: ORAL_TABLET | ORAL | Status: DC

## 2013-08-19 NOTE — Telephone Encounter (Signed)
Pt said her husband died on 08/13/13 will have memorial service this weekend and pt thinks she is starting a sinus infection; facial pain and pressure above eyes and cheeks, slight h/a. No head congestion, fever, S/T or earache. Slight productive cough with yellow phlegm. Pt request antibiotic to [facility] [address] Since cannot come to office due to lose of husband. Pt request cb.

## 2013-08-19 NOTE — Telephone Encounter (Signed)
rx sent for zmax. I hope she is feeling better soon and I send my condolences to her and her family.  Thanks.

## 2013-08-19 NOTE — Telephone Encounter (Signed)
Left message on answering machine to call back.

## 2013-08-20 ENCOUNTER — Telehealth: Payer: Self-pay | Admitting: Family Medicine

## 2013-08-20 MED ORDER — AMOXICILLIN 875 MG PO TABS: 875.0000 mg | ORAL_TABLET | Freq: Two times a day (BID) | ORAL | Status: AC

## 2013-08-20 NOTE — Telephone Encounter (Signed)
left voicemail letting pt know Rx sent to pharmacy

## 2013-08-20 NOTE — Telephone Encounter (Signed)
Pt is calling back regarding medication prescribed for Sinusitis.  Pt states she cannot take Zithromax because it hurts her stomach.  Pt states her stomach is already a mess with her husband passing away but she has not ever been able to take this medication.  Pt is requesting Amoxicillin to be called in for her instead.  (Pharmacy verified to be [facility] in [place]) Office please follow up with patient.

## 2013-08-20 NOTE — Telephone Encounter (Signed)
Noted, thanks. I apologize.  Please cancel the zmax.  I added it to intolerance list.  Amoxil sent.  Thanks.

## 2013-08-20 NOTE — Telephone Encounter (Signed)
Called pt but phone was off and unable to accept voicemail messages

## 2013-08-20 NOTE — Telephone Encounter (Signed)
A new phone note was started, see new phone note regarding this issue

## 2013-08-25 ENCOUNTER — Other Ambulatory Visit (INDEPENDENT_AMBULATORY_CARE_PROVIDER_SITE_OTHER): Payer: Commercial Managed Care - HMO

## 2013-08-25 ENCOUNTER — Other Ambulatory Visit: Payer: Self-pay

## 2013-08-25 DIAGNOSIS — R0600 Dyspnea, unspecified: Secondary | ICD-10-CM

## 2013-08-25 DIAGNOSIS — R0609 Other forms of dyspnea: Secondary | ICD-10-CM

## 2013-08-26 ENCOUNTER — Encounter: Payer: Self-pay | Admitting: Family Medicine

## 2013-08-26 ENCOUNTER — Telehealth: Payer: Self-pay | Admitting: Family Medicine

## 2013-08-26 ENCOUNTER — Telehealth: Payer: Self-pay

## 2013-08-26 NOTE — Telephone Encounter (Signed)
Pt said had missed call from [place] on cell phone. Can find no one at our office that tried to reach pt; spoke with [place] at [facility] and no one there tried to reach pt. Pt said needs to cancel appt with Dr Kirke Corin for 09/01/13. Gave pt contact # for [facility] to call about appt.

## 2013-08-26 NOTE — Telephone Encounter (Signed)
I called her. Her husband had died.  I offered my condolences and she thanked me for the call.

## 2013-08-28 ENCOUNTER — Telehealth: Payer: Self-pay

## 2013-08-28 NOTE — Telephone Encounter (Signed)
Message copied by Marilynne Halsted on Fri Aug 28, 2013  8:27 AM ------      Message from: Lorine Bears A      Created: Thu Aug 27, 2013  1:40 PM       Inform patient that echo was fine. Please offer my condolences for the recent death of her husband. ------

## 2013-08-31 ENCOUNTER — Telehealth: Payer: Self-pay | Admitting: *Deleted

## 2013-08-31 NOTE — Telephone Encounter (Signed)
Decrease Metoprolol to 12.5 mg  Bid.  If she is still having chest pain, then we should schedule a stress test. Otherwise, ok to cancel the appointment.

## 2013-08-31 NOTE — Telephone Encounter (Signed)
Spoke with patient about echo results.   Pt has 2 questions-  1-if  she can decrease metoprolol in 1/2. She does not have BP or heart rate readings but she can tell her heart rate is too slow. She feels more tired and fatigued also.   2- she cancelled appt with Dr Kirke Corin scheduled for 09/01/13. She is asking if Dr Kirke Corin still needs to see her in follow-up.   I will forward to Dr Kirke Corin for review.

## 2013-08-31 NOTE — Telephone Encounter (Signed)
Attempted to contact patient - unable to leave a voice mail.

## 2013-08-31 NOTE — Telephone Encounter (Signed)
Patient called wanting echo results. 

## 2013-08-31 NOTE — Telephone Encounter (Signed)
Pt advised, verbalized understanding. Pt denies anymore chest pain.

## 2013-09-01 ENCOUNTER — Ambulatory Visit: Payer: Medicare PPO | Admitting: Cardiovascular Disease

## 2013-09-01 ENCOUNTER — Telehealth: Payer: Self-pay

## 2013-09-01 NOTE — Telephone Encounter (Signed)
Message copied by Marilynne Halsted on Tue Sep 01, 2013  5:14 PM ------      Message from: Lorine Bears A      Created: Thu Aug 27, 2013  1:40 PM       Inform patient that echo was fine. Please offer my condolences for the recent death of her husband. ------

## 2013-09-02 ENCOUNTER — Telehealth: Payer: Self-pay

## 2013-09-02 NOTE — Telephone Encounter (Signed)
Spoke w/ pt.  She is aware of results and would like a copy of test mailed to her.  Sent out 09/02/13.

## 2013-09-02 NOTE — Telephone Encounter (Signed)
Message copied by Marilynne Halsted on Wed Sep 02, 2013 10:25 AM ------      Message from: Lorine Bears A      Created: Thu Aug 27, 2013  1:40 PM       Inform patient that echo was fine. Please offer my condolences for the recent death of her husband. ------

## 2013-09-17 ENCOUNTER — Encounter: Payer: Self-pay | Admitting: Radiology

## 2013-09-18 ENCOUNTER — Encounter: Payer: Self-pay | Admitting: Family Medicine

## 2013-09-18 ENCOUNTER — Ambulatory Visit (INDEPENDENT_AMBULATORY_CARE_PROVIDER_SITE_OTHER): Payer: Medicare PPO | Admitting: Family Medicine

## 2013-09-18 VITALS — BP 162/84 | HR 77 | Temp 97.5°F | Wt 136.0 lb

## 2013-09-18 DIAGNOSIS — J309 Allergic rhinitis, unspecified: Secondary | ICD-10-CM

## 2013-09-18 DIAGNOSIS — R319 Hematuria, unspecified: Secondary | ICD-10-CM

## 2013-09-18 DIAGNOSIS — F4321 Adjustment disorder with depressed mood: Secondary | ICD-10-CM

## 2013-09-18 DIAGNOSIS — Z23 Encounter for immunization: Secondary | ICD-10-CM

## 2013-09-18 LAB — POCT URINALYSIS DIPSTICK
Bilirubin, UA: NEGATIVE
Blood, UA: NEGATIVE
Ketones, UA: NEGATIVE
Leukocytes, UA: NEGATIVE
Spec Grav, UA: >=1.03
pH, UA: 6

## 2013-09-18 MED ORDER — PREDNISONE 5 MG PO KIT: PACK | ORAL | Status: DC

## 2013-09-18 NOTE — Patient Instructions (Addendum)
I would think about talking to Hospice for counseling.  Take the prednisone with food and let me know if you don't improve.  Take care.

## 2013-09-18 NOTE — Progress Notes (Signed)
She is trying to work through her husband's death.  I encouraged counseling with hospice.   Facial pain and drainage noted.  Cough with some sputum.  No fevers.   Taking claritin.  No ST.  H/o allergies, worse in the fall, had been on pred prev with relief.  More wheeze recently.    Trace protein in urine.  No dysuria.   Meds, vitals, and allergies reviewed.   ROS: See HPI.  Otherwise negative.    GEN: nad, alert and oriented, tearful discussing her husband.  HEENT: mucous membranes moist, tm w/o erythema, nasal exam w/o erythema, clear discharge noted,  OP with cobblestoning, sinuses slightly ttp . NECK: supple w/o LA CV: rrr.   PULM: ctab, no inc wob, No wheeze EXT: no edema SKIN: no acute rash

## 2013-09-19 DIAGNOSIS — F4321 Adjustment disorder with depressed mood: Secondary | ICD-10-CM | POA: Insufficient documentation

## 2013-09-19 NOTE — Assessment & Plan Note (Signed)
This doesn't appear infectious. Given her sx and hx, would use pred taper and fu prn. She agrees.

## 2013-09-19 NOTE — Assessment & Plan Note (Signed)
Expected, encouraged counseling.

## 2013-09-22 ENCOUNTER — Other Ambulatory Visit: Payer: Self-pay

## 2013-09-22 NOTE — Telephone Encounter (Signed)
[name] pt's daughter left v/m; [facility] [address] will not refill Ativan for another 5 days. Pt is almost out of med. Chip Boer request another prescription of Ativan with different directions allowing pt to take more than 1 tab daily while pt is recovering from husband's death to [facility] [address]. Chip Boer request cb.

## 2013-09-23 MED ORDER — LORAZEPAM 1 MG PO TABS: ORAL_TABLET | ORAL | Status: DC

## 2013-09-23 NOTE — Telephone Encounter (Signed)
rx called into pharmacy

## 2013-09-23 NOTE — Telephone Encounter (Signed)
Please call in rx

## 2013-11-04 ENCOUNTER — Telehealth: Payer: Self-pay

## 2013-11-04 ENCOUNTER — Other Ambulatory Visit: Payer: Self-pay | Admitting: Family Medicine

## 2013-11-04 NOTE — Telephone Encounter (Signed)
Pt request refill flovent and prevacid to [facility] [address]. Pt has appt to see Dr Para March 11/09/13 at 10:30 am.

## 2013-11-04 NOTE — Telephone Encounter (Signed)
Pt said has had rt shoulder pain on and off for a long time, heat helps but pt wants to get appt. Pt not having CP or dizziness. Pt scheduled appt 11/09/13 at 10:30 am. 1st appt that would match pts schedule.

## 2013-11-04 NOTE — Telephone Encounter (Signed)
Thanks

## 2013-11-06 ENCOUNTER — Encounter: Payer: Self-pay | Admitting: Radiology

## 2013-11-09 ENCOUNTER — Ambulatory Visit (INDEPENDENT_AMBULATORY_CARE_PROVIDER_SITE_OTHER): Payer: Medicare PPO | Admitting: Family Medicine

## 2013-11-09 ENCOUNTER — Encounter: Payer: Self-pay | Admitting: Family Medicine

## 2013-11-09 ENCOUNTER — Ambulatory Visit (INDEPENDENT_AMBULATORY_CARE_PROVIDER_SITE_OTHER)
Admission: RE | Admit: 2013-11-09 | Discharge: 2013-11-09 | Disposition: A | Discharge status: Home / Self Care | Payer: Medicare PPO | Source: Ambulatory Visit | Attending: Family Medicine | Admitting: Family Medicine

## 2013-11-09 VITALS — BP 132/66 | HR 87 | Temp 97.9°F | Wt 135.0 lb

## 2013-11-09 DIAGNOSIS — F411 Generalized anxiety disorder: Secondary | ICD-10-CM

## 2013-11-09 DIAGNOSIS — M25519 Pain in unspecified shoulder: Secondary | ICD-10-CM | POA: Insufficient documentation

## 2013-11-09 DIAGNOSIS — M25511 Pain in right shoulder: Secondary | ICD-10-CM

## 2013-11-09 DIAGNOSIS — F4321 Adjustment disorder with depressed mood: Secondary | ICD-10-CM

## 2013-11-09 DIAGNOSIS — J45909 Unspecified asthma, uncomplicated: Secondary | ICD-10-CM

## 2013-11-09 MED ORDER — LORAZEPAM 1 MG PO TABS: ORAL_TABLET | ORAL | Status: DC

## 2013-11-09 MED ORDER — MONTELUKAST SODIUM 10 MG PO TABS: 10.0000 mg | ORAL_TABLET | Freq: Every day | ORAL | Status: DC

## 2013-11-09 MED ORDER — LANSOPRAZOLE 30 MG PO CPDR: DELAYED_RELEASE_CAPSULE | ORAL | Status: DC

## 2013-11-09 MED ORDER — HYDROCODONE-ACETAMINOPHEN 7.5-325 MG PO TABS: ORAL_TABLET | ORAL | Status: DC

## 2013-11-09 NOTE — Assessment & Plan Note (Signed)
Generally controlled, no sx today, ctab, continue as is.

## 2013-11-09 NOTE — Assessment & Plan Note (Addendum)
Plain films pending.  Likely cuff pathology.  She has eye surgery pending.  Would get referred to ortho in the meantime.  I didn't inject her shoulder today.  D/w pt about this, with the eye surgery pending.  Would continue current pain meds.  She agrees.

## 2013-11-09 NOTE — Assessment & Plan Note (Signed)
Has family support, condolences offered.  Has holiday plans.

## 2013-11-09 NOTE — Patient Instructions (Signed)
Go to the lab on the way out.  We'll contact you with your lab report. [facility] will call about your referral. Take care.  Glad to see you.  

## 2013-11-09 NOTE — Progress Notes (Signed)
Pre-visit discussion using our clinic review tool. No additional management support is needed unless otherwise documented below in the visit note.  Continued R shoulder pain.  Pain with ROM.  Lateral shoulder pain.  Hydrocodone and heat helps some.  Not painful if she is not active, but "I can't sit and do nothing."  Pain with ROM above her head.  She had been on hydrocodone for shingles pain but has needed it for the shoulder pain.  No ADE.    Anxiety related to death of husband.  Needs refills on lorazepam.  She had cards f/u and was started on metoprolol; has improved in the meantime.   Less SOB with inhalers and singulair usually; she noted some changes with weather changes and that is typical for her.  Complaint with meds. No ADE.    She has cataract surgery planned for 12/15/13.    Meds, vitals, and allergies reviewed.   ROS: See HPI.  Otherwise, noncontributory.  nad Ncat Tearful discussing her husband rrr Ctab R shoulder with pain on supraspinatus testing, ext>int rotation.  + Impingement.  No arm drop.  Pain on lateral proximal R humerus. Distally nv intact.

## 2013-11-09 NOTE — Assessment & Plan Note (Signed)
Continue BB and BZD, no ADE on meds.  She does have some relief with meds.  Will travel to [place] for holidays and needs refill before leaving town, BZD rx given to patient.

## 2013-11-26 ENCOUNTER — Encounter: Payer: Self-pay | Admitting: Family Medicine

## 2013-12-31 ENCOUNTER — Telehealth: Payer: Self-pay | Admitting: *Deleted

## 2013-12-31 ENCOUNTER — Encounter: Payer: Self-pay | Admitting: *Deleted

## 2013-12-31 NOTE — Telephone Encounter (Signed)
Patient called our office stating that she saw Dr. Fletcher Anon and was placed on Lopressor but is not feeling well and BP is 90/50.  Patient says she does not know how to contact Dr. Tyrell Antonio office.  Patient is asking if she should discontinue medication?

## 2013-12-31 NOTE — Telephone Encounter (Signed)
Patient stated that her husband passed away recently and she is having more "palpitaions" than usual. Her blood pressure has been low. 90/60. She is afraid to take her metoprolol with her pressure getting that low. I scheduled appt with Dr. Fletcher Anon and told her to call if her bp dropped any lower than 90/60

## 2013-12-31 NOTE — Telephone Encounter (Signed)
Metoprolol prescribed by Dr. Fletcher Anon. Patient feels dizzy and feels that her bp is low. Please advise

## 2014-01-01 NOTE — Telephone Encounter (Signed)
Just hold Metoprolol until she comes for evaluation.

## 2014-01-01 NOTE — Telephone Encounter (Signed)
I called patient to inform her Dr. Fletcher Anon said she could decrease metoprolol to 12.5 mg bid. She stated that Dr. Fletcher Anon had already made that change and her pressure was still to low.

## 2014-01-01 NOTE — Telephone Encounter (Signed)
Decrease metoprolol to 12.5 mg bid and have her follow up with me.

## 2014-01-01 NOTE — Telephone Encounter (Signed)
Informed patient that per Dr. Fletcher Anon it os ok to hold metoprolol until her visit next week. Patient verbalized understanding.

## 2014-01-08 ENCOUNTER — Ambulatory Visit (INDEPENDENT_AMBULATORY_CARE_PROVIDER_SITE_OTHER): Payer: Medicare HMO | Admitting: Cardiovascular Disease

## 2014-01-08 ENCOUNTER — Encounter: Payer: Self-pay | Admitting: Cardiovascular Disease

## 2014-01-08 VITALS — BP 160/100 | HR 113 | Ht 59.0 in | Wt 135.5 lb

## 2014-01-08 DIAGNOSIS — I10 Essential (primary) hypertension: Secondary | ICD-10-CM

## 2014-01-08 DIAGNOSIS — R002 Palpitations: Secondary | ICD-10-CM

## 2014-01-08 NOTE — Progress Notes (Signed)
Primary care physician: Dr. Damita Dunnings  HPI  This is a 80 year old female who is here today for a followup visit regarding palpitations and frequent PACs.   She was referred to me in 2009 for chest pain and an abnormal stress test. I performed cardiac catheterization in February of 2009 which showed minor luminal irregularities with no evidence of obstructive disease. Ejection fraction was normal. She was noted at that time to have significant sinus tachycardia with frequent PACs. This was treated with metoprolol which was discontinued later.  She was seen in August 08, 2023 for  palpitations, dizziness, sweating and occasional chest discomfort. Echocardiogram showed normal LV systolic function with no significant valvular abnormalities. Holter monitor showed frequent PACs but no other significant arrhythmia. She was treated with metoprolol 25 mg twice daily. Her husband died in 08/08/23. She has been under extreme stress since then. She is still very tearful. Metoprolol was discontinued due to low blood pressure and fatigue. She reports that she felt better off metoprolol.  Allergies  Allergen Reactions  . Albuterol     REACTION: jittery  . Alendronate Sodium     REACTION: GI side eff  . Amoxicillin-Pot Clavulanate     REACTION: non tolerant- but can take plain amox  . Atorvastatin   . Azithromycin     GI upset.  Not an allergy.  . Doxycycline     diarrhea  . Ezetimibe-Simvastatin     REACTION: myalgia  . Gabapentin     REACTION: tremor  . Ibandronate Sodium     REACTION: GI side eff  . Lexapro [Escitalopram Oxalate]     sedation  . Risedronate Sodium     REACTION: GI side eff  . Rosuvastatin     REACTION: myalgia  . Sulfonamide Derivatives     REACTION: non tolerate     Current Outpatient Prescriptions on File Prior to Visit  Medication Sig Dispense Refill  . albuterol (PROVENTIL HFA;VENTOLIN HFA) 108 (90 BASE) MCG/ACT inhaler Inhale 1 puff into the lungs every 6 (six) hours as  needed for wheezing.  1 Inhaler  3  . Calcium Carbonate-Vitamin D (CALTRATE 600+D) 600-400 MG-UNIT per tablet Take 1 tablet by mouth two times a day      . FLOVENT DISKUS 250 MCG/BLIST AEPB INHALE ONE PUFF BY MOUTH TWICE DAILY, RINSE MOUTH AFTER USE  60 each  0  . fluticasone (FLONASE) 50 MCG/ACT nasal spray Place 2 sprays into the nose daily.  16 g  5  . HYDROcodone-acetaminophen (NORCO) 7.5-325 MG per tablet TAKE ONE TABLET BY MOUTH EVERY 8 HOURS AS NEEDED FOR PAIN  270 tablet  0  . lansoprazole (PREVACID) 30 MG capsule TAKE ONE CAPSULE BY MOUTH EVERY DAY  30 capsule  12  . LORazepam (ATIVAN) 1 MG tablet 1 tab po 2-3 times per day for anxiety.  90 tablet  1  . montelukast (SINGULAIR) 10 MG tablet Take 1 tablet (10 mg total) by mouth at bedtime.  30 tablet  12  . Simethicone (GAS-X EXTRA STRENGTH) 125 MG CAPS Take by mouth as needed.       . metoprolol tartrate (LOPRESSOR) 25 MG tablet 1/2 tablet (total 12.5mg ) two times a day       No current facility-administered medications on file prior to visit.     Past Medical History  Diagnosis Date  . Allergy   . Asthma   . GERD (gastroesophageal reflux disease)   . Depression   . Palpitations   . Hyperlipidemia   .  Hypertension   . OA (osteoarthritis) of knee     injections  . Osteopenia     DXA 2011  . Glaucoma   . 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 Shingles     post herpetic neuralgia (pain clinic in past)  . IBS (irritable bowel syndrome)   . Anxiety      Past Surgical History  Procedure Laterality Date  . Gallbladder surgery  1997  . Breast biopsy  1988  . Rectocele repair  2004  . Total abdominal hysterectomy    . Cardiac catheterization  01/2008    65% ;[facility]. Minor luminal irregularities with no evidence of obstructive disease.  . Cataract surgery       Family History  Problem Relation Age of Onset  . Cancer Mother     breast  . Hypertension Mother   . Cancer Sister     colon    . Cancer Daughter     thyroid  . Anxiety disorder Daughter   . Cancer Daughter     breast cancer  . Hypertension Father   . Aneurysm Father     AAA     History   Social History  . Marital Status: Married    Spouse Name: N/A    Number of Children: N/A  . Years of Education: N/A   Occupational History  . Retired     Futures traderHomemaker   Social History Main Topics  . Smoking status: Never Smoker   . Smokeless tobacco: Not on file  . Alcohol Use: No  . Drug Use: No  . Sexual Activity: Not on file   Other Topics Concern  . Not on file   Social History Narrative   Regular exercise- no    [place]    Husband died of lung CA 2014     ROS A 10 point review of system was performed. Negative other than what is mentioned in history of present illness.  PHYSICAL EXAM   BP 158/124  Pulse 113  Ht 4\' 11"  (1.499 m)  Wt 135 lb 8 oz (61.462 kg)  BMI 27.35 kg/m2 Constitutional: She is oriented to person, place, and time. She appears well-developed and well-nourished. No distress.  HENT: No nasal discharge.  Head: Normocephalic and atraumatic.  Eyes: Pupils are equal and round. Right eye exhibits no discharge. Left eye exhibits no discharge.  Neck: Normal range of motion. Neck supple. No JVD present. No thyromegaly present.  Cardiovascular: Normal rate, regular rhythm, normal heart sounds. Exam reveals no gallop and no friction rub. No murmur heard.  Pulmonary/Chest: Effort normal and breath sounds normal. No stridor. No respiratory distress. She has no wheezes. She has no rales. She exhibits no tenderness.  Abdominal: Soft. Bowel sounds are normal. She exhibits no distension. There is no tenderness. There is no rebound and no guarding.  Musculoskeletal: Normal range of motion. She exhibits no edema and no tenderness.  Neurological: She is alert and oriented to person, place, and time. Coordination normal.  Skin: Skin is warm and dry. No rash noted.  She is not diaphoretic. No erythema. No pallor.  Psychiatric: The patient is tearful and anxious.. Judgment and thought content normal.     NWG:NFAOZEKG:Sinus  Tachycardia  -Short PR syndrome  PRi = 114 Low voltage in precordial leads.   -Prominent R(V1) -nonspecific.   -Nonspecific ST depression   +  Extensive T-abnormality  -Nondiagnostic    ABNORMAL    ASSESSMENT AND PLAN

## 2014-01-08 NOTE — Patient Instructions (Signed)
Your physician recommends that you schedule a follow-up appointment in:   Follow up as needed  

## 2014-01-09 NOTE — Assessment & Plan Note (Signed)
The patient has no history of palpitations due to frequent PACs. However, she developed hypotension even with small dose metoprolol. She feels better without treatment. I explained to her that these type of arrhythmia are not life-threatening. Echocardiogram showed no structural heart abnormalities. Previous cardiac catheterization showed no significant coronary artery disease. The goals of treatment are to improve symptoms. She is still extremely anxious and might be depressed after the death of her husband. She can followup with me as needed for worsening symptoms.

## 2014-01-09 NOTE — Assessment & Plan Note (Signed)
She tends to have labile hypertension. Blood pressure dropped with small dose metoprolol. Continue to monitor.

## 2014-01-18 ENCOUNTER — Telehealth: Payer: Self-pay | Admitting: Family Medicine

## 2014-01-18 NOTE — Telephone Encounter (Signed)
Patient Information:  Caller Name: Jillian Oconnell  Phone: [phone]  Patient: Jillian Oconnell  Gender: Female  DOB: 03/08/1934  Age: 80 Years  PCP: Elsie Stain Brigitte Pulse) [facility])  Office Follow Up:  Does the office need to follow up with this patient?: No  Instructions For The Office: N/A  RN Note:  Advised pt to call her pharmacist to ask about eye drops causing low blood pressure.  Symptoms  Reason For Call & Symptoms: Pt calling because blood pressure was low--86/58 and felt "funny in my head". Now blood pressure is 144/82 & 136/81 and she feels fine but anxious because BP was so low. Usual blood pressure 110-120's/70-80's. Had cataract surgery last week and is taking eye drops--wants to know if the eye drops could possibly cause blood pressure to drop.  Reviewed Health History In EMR: Yes  Reviewed Medications In EMR: Yes  Reviewed Allergies In EMR: Yes  Reviewed Surgeries / Procedures: Yes  Date of Onset of Symptoms: 01/18/2014  Guideline(s) Used:  High Blood Pressure  Disposition Per Guideline:   Home Care  Reason For Disposition Reached:   BP 120-139 / 80-89  Advice Given:  Call Back If:  You become worse.  Patient Will Follow Care Advice:  YES

## 2014-01-18 NOTE — Telephone Encounter (Signed)
Patient notified as instructed by telephone. Patient stated that she had cataract surgery and has to use some drops until she goes back Thursday. Patient stated that she will call her eye doctor about this.

## 2014-01-18 NOTE — Telephone Encounter (Signed)
Agreed, ask the pharmacy or the rx'ing clinic.  Some can, but I can't comment w/o knowing the specific med.  Thanks.

## 2014-02-23 ENCOUNTER — Other Ambulatory Visit: Payer: Self-pay

## 2014-02-23 NOTE — Telephone Encounter (Signed)
Pt left v/m requesting rx hydrocodone apap. Call when ready for pick up.  

## 2014-02-24 MED ORDER — HYDROCODONE-ACETAMINOPHEN 7.5-325 MG PO TABS: ORAL_TABLET | ORAL | Status: DC

## 2014-02-24 NOTE — Telephone Encounter (Signed)
Left message at home number to call back.  Unable to leave message on cell phone/voicemail has not been set up.

## 2014-02-24 NOTE — Telephone Encounter (Signed)
Printed.  Thanks.  

## 2014-02-25 NOTE — Telephone Encounter (Signed)
Patient advised.  Rx left at front desk for pick up. 

## 2014-03-31 ENCOUNTER — Telehealth: Payer: Self-pay | Admitting: Family Medicine

## 2014-03-31 DIAGNOSIS — Z1231 Encounter for screening mammogram for malignant neoplasm of breast: Secondary | ICD-10-CM

## 2014-03-31 NOTE — Telephone Encounter (Signed)
Pt called and would like orders put in for a screening mammogram. / lt

## 2014-03-31 NOTE — Telephone Encounter (Signed)
Ordered. Thanks

## 2014-04-02 ENCOUNTER — Encounter: Payer: Self-pay | Admitting: Family Medicine

## 2014-04-02 ENCOUNTER — Ambulatory Visit (INDEPENDENT_AMBULATORY_CARE_PROVIDER_SITE_OTHER): Payer: Commercial Managed Care - HMO | Admitting: Family Medicine

## 2014-04-02 VITALS — BP 120/80 | HR 94 | Temp 97.5°F | Wt 137.2 lb

## 2014-04-02 DIAGNOSIS — F4321 Adjustment disorder with depressed mood: Secondary | ICD-10-CM

## 2014-04-02 DIAGNOSIS — I10 Essential (primary) hypertension: Secondary | ICD-10-CM

## 2014-04-02 DIAGNOSIS — R300 Dysuria: Secondary | ICD-10-CM

## 2014-04-02 DIAGNOSIS — J019 Acute sinusitis, unspecified: Secondary | ICD-10-CM

## 2014-04-02 DIAGNOSIS — R350 Frequency of micturition: Secondary | ICD-10-CM

## 2014-04-02 LAB — BASIC METABOLIC PANEL
BUN: 18 mg/dL (ref 6–23)
CHLORIDE: 102 meq/L (ref 96–112)
CO2: 31 meq/L (ref 19–32)
Calcium: 9.5 mg/dL (ref 8.4–10.5)
Creatinine, Ser: 0.9 mg/dL (ref 0.4–1.2)
GFR: 66.17 mL/min (ref >60.00)
GLUCOSE: 85 mg/dL (ref 70–99)
POTASSIUM: 4.4 meq/L (ref 3.5–5.1)
Sodium: 140 mEq/L (ref 135–145)

## 2014-04-02 LAB — POCT URINALYSIS DIPSTICK
BILIRUBIN UA: NEGATIVE
GLUCOSE UA: NEGATIVE
KETONES UA: NEGATIVE
Leukocytes, UA: NEGATIVE
Nitrite, UA: NEGATIVE
PH UA: 8
Protein, UA: NEGATIVE
RBC UA: NEGATIVE
Urobilinogen, UA: NEGATIVE

## 2014-04-02 MED ORDER — AMOXICILLIN 875 MG PO TABS: 875.0000 mg | ORAL_TABLET | Freq: Two times a day (BID) | ORAL | Status: DC

## 2014-04-02 NOTE — Progress Notes (Signed)
Pre visit review using our clinic review tool, if applicable. No additional management support is needed unless otherwise documented below in the visit note.  She is still grieving her husband and this is hard for her.  She has church and hospice support.    She had recent urinary frequency.  S/p hysterectomy.    She had some numbness in her feet w/o pain.  When laying down in bed.  Not noted when up walking around.  Her mattress in is good shape.    Sinus pain again.  Typical for spring, with weather change.  Yellow discharge. Max sinus area ttp.  Drainage.  Using baseline meds with some help.    Meds, vitals, and allergies reviewed.   ROS: See HPI.  Otherwise, noncontributory.  GEN: nad, alert and oriented HEENT: mucous membranes moist, tm w/o erythema, nasal exam w/o erythema, clear discharge noted,  OP with cobblestoning, max sinuses ttp NECK: supple w/o LA CV: rrr.   PULM: ctab, no inc wob EXT: no edema SKIN: no acute rash

## 2014-04-02 NOTE — Patient Instructions (Signed)
Go to the lab on the way out.  We'll contact you with your lab report. Start the amoxil today and use your regular medicines in the meantime.  Take care. We'll be in touch.

## 2014-04-03 ENCOUNTER — Telehealth: Payer: Self-pay | Admitting: Family Medicine

## 2014-04-03 NOTE — Telephone Encounter (Signed)
Relevant patient education mailed to patient.  

## 2014-04-04 DIAGNOSIS — J019 Acute sinusitis, unspecified: Secondary | ICD-10-CM | POA: Insufficient documentation

## 2014-04-04 NOTE — Assessment & Plan Note (Signed)
See notes on u/a.

## 2014-04-04 NOTE — Assessment & Plan Note (Signed)
Nontoxic. Amoxil, supportive care. F/u prn.

## 2014-04-04 NOTE — Assessment & Plan Note (Signed)
Discussed, she has support.

## 2014-04-17 ENCOUNTER — Emergency Department: Payer: Self-pay | Admitting: Emergency Medicine

## 2014-04-17 LAB — URINALYSIS, COMPLETE
BACTERIA: NONE SEEN
BILIRUBIN, UR: NEGATIVE
Glucose,UR: NEGATIVE mg/dL (ref 0–75)
Hyaline Cast: 3
Ketone: NEGATIVE
LEUKOCYTE ESTERASE: NEGATIVE
NITRITE: NEGATIVE
PROTEIN: NEGATIVE
Ph: 6 (ref 4.5–8.0)
Specific Gravity: 1.013 (ref 1.003–1.030)
Squamous Epithelial: <1

## 2014-04-17 LAB — TROPONIN I: Troponin-I: <0.02 ng/mL

## 2014-04-17 LAB — CBC
HCT: 42.6 % (ref 35.0–47.0)
HGB: 14 g/dL (ref 12.0–16.0)
MCH: 30.5 pg (ref 26.0–34.0)
MCHC: 33 g/dL (ref 32.0–36.0)
MCV: 92 fL (ref 80–100)
Platelet: 247 10*3/uL (ref 150–440)
RBC: 4.61 10*6/uL (ref 3.80–5.20)
RDW: 13.3 % (ref 11.5–14.5)
WBC: 7.5 10*3/uL (ref 3.6–11.0)

## 2014-04-17 LAB — BASIC METABOLIC PANEL
Anion Gap: 7 (ref 7–16)
BUN: 24 mg/dL — ABNORMAL HIGH (ref 7–18)
CALCIUM: 8.6 mg/dL (ref 8.5–10.1)
CHLORIDE: 107 mmol/L (ref 98–107)
CO2: 26 mmol/L (ref 21–32)
CREATININE: 0.93 mg/dL (ref 0.60–1.30)
EGFR (African American): >60
GFR CALC NON AF AMER: 59 — AB
Glucose: 147 mg/dL — ABNORMAL HIGH (ref 65–99)
OSMOLALITY: 286 (ref 275–301)
Potassium: 4.4 mmol/L (ref 3.5–5.1)
SODIUM: 140 mmol/L (ref 136–145)

## 2014-04-19 ENCOUNTER — Encounter: Payer: Self-pay | Admitting: Cardiovascular Disease

## 2014-04-19 ENCOUNTER — Ambulatory Visit (INDEPENDENT_AMBULATORY_CARE_PROVIDER_SITE_OTHER): Payer: Commercial Managed Care - HMO | Admitting: Cardiovascular Disease

## 2014-04-19 VITALS — BP 162/84 | HR 107 | Ht 59.0 in | Wt 136.0 lb

## 2014-04-19 DIAGNOSIS — I471 Supraventricular tachycardia, unspecified: Secondary | ICD-10-CM

## 2014-04-19 DIAGNOSIS — R000 Tachycardia, unspecified: Secondary | ICD-10-CM

## 2014-04-19 DIAGNOSIS — F411 Generalized anxiety disorder: Secondary | ICD-10-CM

## 2014-04-19 NOTE — Progress Notes (Signed)
Primary care physician: Dr. Damita Dunnings  HPI  This is a 80 year old female who is here today for a followup visit regarding recent emergency room visit for supraventricular tachycardia. I saw her last year for palpitations and frequent PACs.   She was referred to me in 2009 for chest pain and an abnormal stress test. I performed cardiac catheterization in February of 2009 which showed minor luminal irregularities with no evidence of obstructive disease. Ejection fraction was normal. She was seen in 2013-08-03 for  palpitations, dizziness, sweating and occasional chest discomfort. Echocardiogram showed normal LV systolic function with no significant valvular abnormalities. Holter monitor showed frequent PACs but no other significant arrhythmia. She was treated with metoprolol 25 mg twice daily. This was discontinued due to low blood pressure and fatigue.  Her husband died in 08-04-2023. Since then, she has been having anxiety and depression. 2 days ago, she had sudden onset of tachycardia associated with diaphoresis and dizziness. She called EMS and was found to have a heart rate of 220 beats per minute according to the patient. I don't have documentation of that. She was given IV medication. By the time she arrived to the emergency room, heart rate was 113 beats. Labs were unremarkable including negative troponin. She was started on diltiazem extended release 120 mg once daily.   Allergies  Allergen Reactions  . Albuterol     REACTION: jittery  . Alendronate Sodium     REACTION: GI side eff  . Amoxicillin-Pot Clavulanate     REACTION: non tolerant- but can take plain amox  . Atorvastatin   . Azithromycin     GI upset.  Not an allergy.  . Doxycycline     diarrhea  . Ezetimibe-Simvastatin     REACTION: myalgia  . Gabapentin     REACTION: tremor  . Ibandronate Sodium     REACTION: GI side eff  . Lexapro [Escitalopram Oxalate]     sedation  . Risedronate Sodium     REACTION: GI side eff    . Rosuvastatin     REACTION: myalgia  . Sulfonamide Derivatives     REACTION: non tolerate     Current Outpatient Prescriptions on File Prior to Visit  Medication Sig Dispense Refill  . albuterol (PROVENTIL HFA;VENTOLIN HFA) 108 (90 BASE) MCG/ACT inhaler Inhale 1 puff into the lungs every 6 (six) hours as needed for wheezing.  1 Inhaler  3  . Calcium Carbonate-Vitamin D (CALTRATE 600+D) 600-400 MG-UNIT per tablet Take 1 tablet by mouth two times a day      . FLOVENT DISKUS 250 MCG/BLIST AEPB INHALE ONE PUFF BY MOUTH TWICE DAILY, RINSE MOUTH AFTER USE  60 each  0  . fluticasone (FLONASE) 50 MCG/ACT nasal spray Place 2 sprays into the nose daily.  16 g  5  . HYDROcodone-acetaminophen (NORCO) 7.5-325 MG per tablet TAKE ONE TABLET BY MOUTH EVERY 8 HOURS AS NEEDED FOR PAIN  270 tablet  0  . lansoprazole (PREVACID) 30 MG capsule TAKE ONE CAPSULE BY MOUTH EVERY DAY  30 capsule  12  . LORazepam (ATIVAN) 1 MG tablet 1 tab po 2-3 times per day for anxiety.  90 tablet  1  . montelukast (SINGULAIR) 10 MG tablet Take 1 tablet (10 mg total) by mouth at bedtime.  30 tablet  12  . Simethicone (GAS-X EXTRA STRENGTH) 125 MG CAPS Take by mouth as needed.        No current facility-administered medications on file prior to  visit.     Past Medical History  Diagnosis Date  . Allergy   . Asthma   . GERD (gastroesophageal reflux disease)   . Depression   . Palpitations   . Hyperlipidemia   . Hypertension   . OA (osteoarthritis) of knee     injections  . Osteopenia     DXA 04/17/10  . Glaucoma   . 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 Shingles     post herpetic neuralgia (pain clinic in past)  . IBS (irritable bowel syndrome)   . Anxiety      Past Surgical History  Procedure Laterality Date  . Gallbladder surgery  1997  . Breast biopsy  1988  . Rectocele repair  Apr 18, 2003  . Total abdominal hysterectomy    . Cardiac catheterization  01/2008    65% ;[facility]. Minor  luminal irregularities with no evidence of obstructive disease.  . Cataract surgery       Family History  Problem Relation Age of Onset  . Cancer Mother     breast  . Hypertension Mother   . Cancer Sister     colon  . Cancer Daughter     thyroid  . Anxiety disorder Daughter   . Cancer Daughter     breast cancer  . Hypertension Father   . Aneurysm Father     AAA     History   Social History  . Marital Status: Married    Spouse Name: N/A    Number of Children: N/A  . Years of Education: N/A   Occupational History  . Retired     Agricultural engineer   Social History Main Topics  . Smoking status: Never Smoker   . Smokeless tobacco: Not on file  . Alcohol Use: No  . Drug Use: No  . Sexual Activity: Not on file   Other Topics Concern  . Not on file   Social History Narrative   Regular exercise- no    Moved here from [place]- to be closer to family    Husband died of lung CA Apr 17, 2013     ROS A 10 point review of system was performed. Negative other than what is mentioned in history of present illness.  PHYSICAL EXAM   BP 162/84  Pulse 107  Ht 4\' 11"  (1.499 m)  Wt 136 lb (61.689 kg)  BMI 27.45 kg/m2 Constitutional: She is oriented to person, place, and time. She appears well-developed and well-nourished. No distress.  HENT: No nasal discharge.  Head: Normocephalic and atraumatic.  Eyes: Pupils are equal and round. Right eye exhibits no discharge. Left eye exhibits no discharge.  Neck: Normal range of motion. Neck supple. No JVD present. No thyromegaly present.  Cardiovascular: Normal rate, regular rhythm, normal heart sounds. Exam reveals no gallop and no friction rub. No murmur heard.  Pulmonary/Chest: Effort normal and breath sounds normal. No stridor. No respiratory distress. She has no wheezes. She has no rales. She exhibits no tenderness.  Abdominal: Soft. Bowel sounds are normal. She exhibits no distension. There is no tenderness. There is no rebound and no guarding.    Musculoskeletal: Normal range of motion. She exhibits no edema and no tenderness.  Neurological: She is alert and oriented to person, place, and time. Coordination normal.  Skin: Skin is warm and dry. No rash noted. She is not diaphoretic. No erythema. No pallor.  Psychiatric: The patient is tearful and  anxious.. Judgment and thought content normal.     ZSW:FUXNA  Tachycardia  Low voltage in precordial leads.   ABNORMAL      ASSESSMENT AND PLAN

## 2014-04-19 NOTE — Assessment & Plan Note (Signed)
The patient also seems to be significantly depressed with loss of interest. She has no suicidal ideation. This is likely contributing to palpitations and tachycardia. She might benefit from treatment with an SSRI. I will defer this to Dr. Damita Dunnings.

## 2014-04-19 NOTE — Assessment & Plan Note (Signed)
It seems that the patient had first documented episode of supraventricular tachycardia. I suspect that anxiety and depression is playing a role in this. I discussed with her vagal maneuvers. Continue diltiazem extended release 120 mg once daily. The dose can be increased if needed. Heart rate and blood pressure are controlled at home. I am going to check TSH.

## 2014-04-19 NOTE — Patient Instructions (Signed)
Your physician recommends that you have lab work today: TSH  Your physician recommends that you continue on your current medications as directed. Please refer to the Current Medication list given to you today.  Your physician recommends that you schedule a follow-up appointment in:  3 months

## 2014-04-20 LAB — TSH: TSH: 2.49 u[IU]/mL (ref 0.450–4.500)

## 2014-04-27 ENCOUNTER — Ambulatory Visit: Payer: Commercial Managed Care - HMO | Admitting: Family Medicine

## 2014-04-27 ENCOUNTER — Ambulatory Visit (INDEPENDENT_AMBULATORY_CARE_PROVIDER_SITE_OTHER): Payer: Commercial Managed Care - HMO | Admitting: Family Medicine

## 2014-04-27 ENCOUNTER — Encounter: Payer: Self-pay | Admitting: Family Medicine

## 2014-04-27 VITALS — BP 140/80 | HR 93 | Temp 98.4°F | Wt 135.0 lb

## 2014-04-27 DIAGNOSIS — B0229 Other postherpetic nervous system involvement: Secondary | ICD-10-CM

## 2014-04-27 DIAGNOSIS — J019 Acute sinusitis, unspecified: Secondary | ICD-10-CM

## 2014-04-27 DIAGNOSIS — F411 Generalized anxiety disorder: Secondary | ICD-10-CM

## 2014-04-27 MED ORDER — AMOXICILLIN 875 MG PO TABS: 875.0000 mg | ORAL_TABLET | Freq: Two times a day (BID) | ORAL | Status: DC

## 2014-04-27 MED ORDER — SERTRALINE HCL 25 MG PO TABS: 25.0000 mg | ORAL_TABLET | Freq: Every day | ORAL | Status: DC

## 2014-04-27 NOTE — Progress Notes (Signed)
Pre visit review using our clinic review tool, if applicable. No additional management support is needed unless otherwise documented below in the visit note.  F/u for anxiety.  She is taking lorazepam once a day total, 1/2 in AM and 1/2 later on.  She gets some sleep that way.   Discussed zoloft as an alternative.  She didn't tolerate lexapro prev. She has sig anxiety, and that may contribute to her SVT.  D/w pt about vagal interventions.  She understands the point of those.  She doesn't drink, no caffeine.  She is considering volunteering at the hospice store to get out of the house, have some social interaction, and help others.   Sinus pain continues but improved from prev.  Not fully resolved.  H/o similar, did get some better with amoxil prev.  Using baseline meds w/o ADE.  No fevers.   Meds, vitals, and allergies reviewed.   ROS: See HPI.  Otherwise, noncontributory.  nad ncat Tm wnl Nasal exam slightly stuffy OP wnl L>R sinuses ttp Neck supple, no LA rrr ctab Ext w/o edema Speech wnl

## 2014-04-27 NOTE — Patient Instructions (Signed)
Add on the sertraline 25 mg a day and try to get out when the weather is better.  Restart the amoxil in the meantime.  Take care.   Let me know if you aren't improving.

## 2014-04-28 NOTE — Assessment & Plan Note (Signed)
Restart amoxil, f/u prn. Nontoxic.

## 2014-04-28 NOTE — Assessment & Plan Note (Signed)
Trial of low dose zoloft, mainly to see if she can tolerate this dose.  We can inc dose later if needed/tolerated.  She agrees.  Okay for outpatient f/u.

## 2014-04-28 NOTE — Assessment & Plan Note (Signed)
Still taking about 2.5 hydrocodone a day for pain. Down from about 3 pills a day.  D/w pt.  No ADE.

## 2014-05-17 ENCOUNTER — Other Ambulatory Visit: Payer: Self-pay | Admitting: *Deleted

## 2014-05-17 MED ORDER — DILTIAZEM HCL ER COATED BEADS 120 MG PO CP24: 120.0000 mg | ORAL_CAPSULE | Freq: Every day | ORAL | Status: DC

## 2014-05-19 ENCOUNTER — Telehealth: Payer: Self-pay

## 2014-05-19 MED ORDER — METOPROLOL SUCCINATE ER 25 MG PO TB24: 25.0000 mg | ORAL_TABLET | Freq: Every day | ORAL | Status: DC

## 2014-05-19 NOTE — Telephone Encounter (Signed)
Informed patient of Dr. Jacklynn Ganong recommendation Patient verbalized understanding

## 2014-05-19 NOTE — Telephone Encounter (Signed)
Pt called stating that her BP has been low, 82/65.  Dr. Fletcher Anon advised her to call if systolic was below 779. She has run out of her diltiazem and would like to know if this needs to be changed or if refills need to be sent in.

## 2014-05-19 NOTE — Telephone Encounter (Signed)
Pt is calling back to ask about BP medication. Please call.

## 2014-05-19 NOTE — Telephone Encounter (Signed)
She took her last Diltiazem tablet last night  Her pressure came back up to 106/66  She felt weak and dizzy when it was low  She is feeling a little better now

## 2014-05-19 NOTE — Telephone Encounter (Signed)
Is the blood pressure running that low even without Diltiazem?

## 2014-05-19 NOTE — Telephone Encounter (Signed)
Instructed patient to continue to monitor BP on Toprol and inform us if it remains low  Patient verbalized understanding

## 2014-05-19 NOTE — Telephone Encounter (Signed)
Let's switch Diltiazem to Toprol 25 mg once daily. She was on Metoprolol before but this is a lower dose and long acting.

## 2014-06-08 ENCOUNTER — Other Ambulatory Visit: Payer: Self-pay | Admitting: Family Medicine

## 2014-06-08 NOTE — Telephone Encounter (Signed)
Called cell # on file and no answer and pt's voicemail box had not been set up (couldn't leave a voicemail). Called home # on file and no answer and no voicemail (kept ringing)

## 2014-06-08 NOTE — Telephone Encounter (Signed)
Received refill request electronically from pharmacy. Refill request does not match medication sheet. Last office visit 04/27/14. Is it okay to refill medication?

## 2014-06-08 NOTE — Telephone Encounter (Signed)
Verify with patient. I though she was taking once a day.  Thanks.

## 2014-06-09 ENCOUNTER — Other Ambulatory Visit: Payer: Self-pay | Admitting: Family Medicine

## 2014-06-09 NOTE — Telephone Encounter (Signed)
Rx called to pharmacy as instructed. 

## 2014-06-09 NOTE — Telephone Encounter (Signed)
Called patient and was advised that the last script that she received was written for 2-3 times a day as needed and sometimes she needs it more than once a day.  Patient stated that she quit taking Sertraline because it made her too sleepy and she almost fell and decided not to take it and she does just fine with the Lorazepam.

## 2014-06-09 NOTE — Telephone Encounter (Signed)
Thanks, please call in.

## 2014-06-21 NOTE — Telephone Encounter (Signed)
This encounter was created in error - please disregard.

## 2014-07-19 ENCOUNTER — Other Ambulatory Visit: Payer: Self-pay

## 2014-07-19 NOTE — Telephone Encounter (Signed)
Pt left v/m requesting rx hydrocodone apap. Call when ready for pick up. Pt will be in [place] area later today and pt is not out of med but would like to pick up today.Please advise.

## 2014-07-20 ENCOUNTER — Ambulatory Visit: Payer: Commercial Managed Care - HMO | Admitting: Cardiovascular Disease

## 2014-07-20 MED ORDER — HYDROCODONE-ACETAMINOPHEN 7.5-325 MG PO TABS: ORAL_TABLET | ORAL | Status: DC

## 2014-07-20 NOTE — Telephone Encounter (Signed)
Printed. Thanks. I didn't get to this until today.

## 2014-07-20 NOTE — Telephone Encounter (Signed)
Patient advised.  Rx left at front desk for pick up. 

## 2014-07-23 ENCOUNTER — Telehealth: Payer: Self-pay

## 2014-07-23 ENCOUNTER — Ambulatory Visit (INDEPENDENT_AMBULATORY_CARE_PROVIDER_SITE_OTHER): Payer: Commercial Managed Care - HMO | Admitting: Family Medicine

## 2014-07-23 ENCOUNTER — Encounter: Payer: Self-pay | Admitting: Family Medicine

## 2014-07-23 VITALS — BP 142/80 | HR 80 | Temp 98.0°F | Wt 137.0 lb

## 2014-07-23 DIAGNOSIS — B0229 Other postherpetic nervous system involvement: Secondary | ICD-10-CM

## 2014-07-23 DIAGNOSIS — J019 Acute sinusitis, unspecified: Secondary | ICD-10-CM

## 2014-07-23 MED ORDER — AMOXICILLIN 875 MG PO TABS: 875.0000 mg | ORAL_TABLET | Freq: Two times a day (BID) | ORAL | Status: AC

## 2014-07-23 MED ORDER — FLUTICASONE PROPIONATE (INHAL) 250 MCG/BLIST IN AEPB: INHALATION_SPRAY | RESPIRATORY_TRACT | Status: DC

## 2014-07-23 NOTE — Telephone Encounter (Signed)
Pt was seen earlier today and [facility] did not get amoxicillin rx electronically. Spoke with Jillian Oconnell at [address] and gave verbal amoxicillin 875 mg taking one tab po bid. # 20 x 0. Pt notified called in med.

## 2014-07-23 NOTE — Progress Notes (Signed)
Pre visit review using our clinic review tool, if applicable. No additional management support is needed unless otherwise documented below in the visit note.  Her middle daughter continues to have GI troubles (with GI f/u pending) and this has been unsetting for the patient.  Another daughter recently had surgery.  The youngest daughter is doing well now.   She drove to [place], first trip w/o her husband since he passed away.  She went with family members, got to see her brother and sister.    Sx started more than 2-3 weeks ago.  More pressure and facial pain, stuffy nose.  Cough, yellow sputum.  No fevers.  No ear pain.  No vomiting.   Mult med intolerances, d/w pt.    Pain- some days better than others.   No ADE on meds.  D/w pt about options.  She was injected prev at the pain clinic w/o relief.    Meds, vitals, and allergies reviewed.   ROS: See HPI.  Otherwise, noncontributory.  GEN: nad, alert and oriented HEENT: mucous membranes moist, tm w/o erythema, nasal exam w/o erythema, clear discharge noted,  OP with cobblestoning, frontal>max sinus ttp NECK: supple w/o LA CV: rrr.   PULM: ctab, no inc wob EXT: no edema SKIN: no acute rash

## 2014-07-23 NOTE — Patient Instructions (Signed)
Restart the amoxil and use the flonase in the meantime. Take care.  Glad to see you.

## 2014-07-25 NOTE — Assessment & Plan Note (Signed)
Likely, continue supportive tx, start amoxil, f/u prn.  Nontoxic.  She agrees.

## 2014-07-25 NOTE — Assessment & Plan Note (Signed)
Continue current meds.  No ADE. Some relief.

## 2014-07-30 ENCOUNTER — Ambulatory Visit (INDEPENDENT_AMBULATORY_CARE_PROVIDER_SITE_OTHER): Payer: Commercial Managed Care - HMO | Admitting: Family Medicine

## 2014-07-30 ENCOUNTER — Ambulatory Visit (INDEPENDENT_AMBULATORY_CARE_PROVIDER_SITE_OTHER)
Admission: RE | Admit: 2014-07-30 | Discharge: 2014-07-30 | Disposition: A | Discharge status: Home / Self Care | Payer: Commercial Managed Care - HMO | Source: Ambulatory Visit | Attending: Family Medicine | Admitting: Family Medicine

## 2014-07-30 ENCOUNTER — Encounter: Payer: Self-pay | Admitting: Family Medicine

## 2014-07-30 VITALS — BP 126/78 | HR 80 | Temp 97.5°F | Wt 138.2 lb

## 2014-07-30 DIAGNOSIS — M25539 Pain in unspecified wrist: Secondary | ICD-10-CM

## 2014-07-30 DIAGNOSIS — M25532 Pain in left wrist: Secondary | ICD-10-CM

## 2014-07-30 NOTE — Progress Notes (Signed)
Pre visit review using our clinic review tool, if applicable. No additional management support is needed unless otherwise documented below in the visit note.  Sinus sx are some better.  On abx and claritin.    L hand pain.  No trauma.  No falls.  Pain started yesterday.  Palmar side of L hand, proximal 5th MC and ulnar side of wrist.  No pain on extensor side.  Locally puffy, not red now but was redder last night.  Pain is some better today.  No h/o gout.  No pain for the bedsheet laying across her hand.  She had pushed up to get off the floor after stooping down to clean a floor under a bed recently, unclear if that caused the pain.   Meds, vitals, and allergies reviewed.   ROS: See HPI.  Otherwise, noncontributory.  nad Max sinuses slightly ttp B rrr ctab L wrist with mild pain on ROM with local swelling but no bruising at proximal 5th MC and ulnar side of wrist.  Distally nv intact Not ttp on radial side of wrist or the rest of the hand.   xrays reviewed.

## 2014-07-30 NOTE — Patient Instructions (Signed)
This looks like a strain, not a fracture.  I would ice it down and use an OTC wrist brace. Take care.  Glad to see you.

## 2014-08-01 DIAGNOSIS — M25539 Pain in unspecified wrist: Secondary | ICD-10-CM | POA: Insufficient documentation

## 2014-08-01 NOTE — Assessment & Plan Note (Signed)
Likely a soft tissue strain, possibly from recent housework.  She'll use an OTC splint, likely already has one at home.  F/u prn.  She agrees. xrays neg for fx.

## 2014-08-06 ENCOUNTER — Telehealth: Payer: Self-pay

## 2014-08-06 MED ORDER — PREDNISONE 5 MG PO KIT: PACK | ORAL | Status: DC

## 2014-08-06 NOTE — Telephone Encounter (Signed)
Sent, f/u prn. Thanks.  

## 2014-08-06 NOTE — Telephone Encounter (Signed)
pt left v/m; pt has finished taking antibiotic and can tell no improvement of symptoms; has a lot of drainage and prod cough. Pt request dose pak of prednisone to [facility] [address].Please advise.

## 2014-08-06 NOTE — Telephone Encounter (Signed)
Patient advised.

## 2014-09-07 ENCOUNTER — Encounter: Payer: Self-pay | Admitting: Family Medicine

## 2014-09-07 ENCOUNTER — Ambulatory Visit (INDEPENDENT_AMBULATORY_CARE_PROVIDER_SITE_OTHER): Payer: Commercial Managed Care - HMO | Admitting: Family Medicine

## 2014-09-07 ENCOUNTER — Ambulatory Visit (INDEPENDENT_AMBULATORY_CARE_PROVIDER_SITE_OTHER)
Admission: RE | Admit: 2014-09-07 | Discharge: 2014-09-07 | Disposition: A | Discharge status: Home / Self Care | Payer: Commercial Managed Care - HMO | Source: Ambulatory Visit | Attending: Family Medicine | Admitting: Family Medicine

## 2014-09-07 VITALS — BP 130/70 | HR 92 | Temp 97.9°F | Wt 139.5 lb

## 2014-09-07 DIAGNOSIS — M25569 Pain in unspecified knee: Secondary | ICD-10-CM

## 2014-09-07 DIAGNOSIS — M25561 Pain in right knee: Secondary | ICD-10-CM

## 2014-09-07 MED ORDER — LORAZEPAM 1 MG PO TABS: ORAL_TABLET | ORAL | Status: DC

## 2014-09-07 NOTE — Patient Instructions (Signed)
Use ice and an over the counter knee brace (not a hard brace) and this should get better. Take care.

## 2014-09-07 NOTE — Progress Notes (Signed)
Pre visit review using our clinic review tool, if applicable. No additional management support is needed unless otherwise documented below in the visit note.  Feel 2-3 weeks ago, going to the door (inside her home) and her daughter's dog tripped her up.  No LOC. Went down on her R side.  She got up on her own.  R shoulder was sore and bruised, better now.  She didn't hit her head.  R knee bruised still.  No pain with sitting but pain as she is standing up.  She tried a heating pad, helped some.    She needed a refill on her ativan.  She has tried to skip doses when possible.  It doesn't appear that the ativan contributed to the fall.    Meds, vitals, and allergies reviewed.   ROS: See HPI.  Otherwise, noncontributory.  nad Able to bear weight on R leg. R knee slightly bruised but not puffy, ttp at the patella and inf to patella but not ttp o/w on the joints lines.  Crepitus noted with ROM but no locking.  Not ttp on posterior or medial/lateral knee  xrays reviewed, no fx.

## 2014-09-08 DIAGNOSIS — M25569 Pain in unspecified knee: Secondary | ICD-10-CM | POA: Insufficient documentation

## 2014-09-08 NOTE — Assessment & Plan Note (Signed)
Would get OTC knee sleeve and use ice in the meantime.  Should improve slowly.  Likely soft tissue injuries that should slowly heal.  She agrees.  Fu prn.  Images d/w pt.

## 2014-10-08 ENCOUNTER — Ambulatory Visit (INDEPENDENT_AMBULATORY_CARE_PROVIDER_SITE_OTHER): Payer: Commercial Managed Care - HMO | Admitting: Family Medicine

## 2014-10-08 ENCOUNTER — Encounter: Payer: Self-pay | Admitting: Family Medicine

## 2014-10-08 VITALS — BP 144/82 | HR 88 | Temp 98.0°F | Wt 138.2 lb

## 2014-10-08 DIAGNOSIS — Z23 Encounter for immunization: Secondary | ICD-10-CM

## 2014-10-08 DIAGNOSIS — J0100 Acute maxillary sinusitis, unspecified: Secondary | ICD-10-CM

## 2014-10-08 MED ORDER — AMOXICILLIN 875 MG PO TABS: 875.0000 mg | ORAL_TABLET | Freq: Two times a day (BID) | ORAL | Status: AC

## 2014-10-08 NOTE — Patient Instructions (Signed)
Start back on the amoxil and keep using the flonvent.  Take care. Glad to see you.  Drink plenty of fluids.

## 2014-10-08 NOTE — Progress Notes (Signed)
Pre visit review using our clinic review tool, if applicable. No additional management support is needed unless otherwise documented below in the visit note.  Sick for about 1 week. Sinus pressure.  Facial pain.  Post nasal gtt.  Thick, can be colored. No fevers.  No cough, only throat clearing.    She'll call Dr. Jefm Bryant about her R shoulder pain, which continues to bother her.    Meds, vitals, and allergies reviewed.   ROS: See HPI.  Otherwise, noncontributory.  GEN: nad, alert and oriented HEENT: mucous membranes moist, tm w/o erythema, nasal exam w/o erythema, clear discharge noted,  OP with cobblestoning, max/frontal sinuses ttp NECK: supple w/o LA CV: rrr.   PULM: ctab, no inc wob EXT: no edema SKIN: no acute rash

## 2014-10-10 DIAGNOSIS — J019 Acute sinusitis, unspecified: Secondary | ICD-10-CM | POA: Insufficient documentation

## 2014-10-10 NOTE — Assessment & Plan Note (Signed)
Nontoxic, amoxil, supportive care.  F/u prn.  D/w pt.  She agrees. See instructions.

## 2014-11-15 ENCOUNTER — Telehealth: Payer: Self-pay | Admitting: Family Medicine

## 2014-11-15 DIAGNOSIS — M79671 Pain in right foot: Secondary | ICD-10-CM

## 2014-11-15 NOTE — Telephone Encounter (Signed)
Should be okay to wait until 12/2

## 2014-11-15 NOTE — Telephone Encounter (Signed)
Done. Thanks.

## 2014-11-15 NOTE — Telephone Encounter (Signed)
Patient Information:  Caller Name: Raenah  Phone: [phone]  Patient: Jillian Oconnell A  Gender: Female  DOB: 1934/04/30  Age: 80 Years  PCP: Elsie Stain Brigitte Pulse) [facility])  Office Follow Up:  Does the office need to follow up with this patient?: No  Instructions For The Office: N/A  RN Note:  Sinus pain  rated 5-6/10 and is worse when bends over. Minor right earache present at start of symptoms. Informed must be seen for antibiotics per MD orders. No appointments remain with Dr. Damita Dunnings or anyone at [facility] for 11/15/14.  Dr Damita Dunnings has no appointmetns for 11/16/14.  Pt refuses to see any other provider.  Requests appointment for 11/17/14 with Dr Damita Dunnings.  Discussed disposition with Rina/office nurse; instructed to schedule appointment for 11/17/14 at 1415 with Dr Damita Dunnings and advise pt to call back if symptoms worsen to see another provider.  Verbalized understanding.   Symptoms  Reason For Call & Symptoms: Called for antibiotic for "sinus infection."  Reports pressure in cheeks and forehead, nasal drainage and headache.  Reviewed Health History In EMR: Yes  Reviewed Medications In EMR: Yes  Reviewed Allergies In EMR: Yes  Reviewed Surgeries / Procedures: Yes  Date of Onset of Symptoms: 11/08/2014  Guideline(s) Used:  Sinus Pain and Congestion  Disposition Per Guideline:   See Today or Tomorrow in Office  Reason For Disposition Reached:   Patient wants to be seen  Advice Given:  Reassurance:   Sinus congestion is a normal part of a cold.  Usually home treatment with nasal washes can prevent an actual bacterial sinus infection.  Antibiotics are not helpful for the sinus congestion that occurs with colds.  For a Runny Nose With Profuse Discharge:  Nasal mucus and discharge helps to wash viruses and bacteria out of the nose and sinuses.  Blowing the nose is all that is needed.  For a Stuffy Nose - Use Nasal Washes:  Introduction: Saline (salt water) nasal irrigation  (nasal wash) is an effective and simple home remedy for treating stuffy nose and sinus congestion. The nose can be irrigated by pouring, spraying, or squirting salt water into the nose and then letting it run back out.  How it Helps: The salt water rinses out excess mucus, washes out any irritants (dust, allergens) that might be present, and moistens the nasal cavity.  Methods: There are several ways to perform nasal irrigation. You can use a saline nasal spray bottle (available over-the-counter), a rubber ear syringe, a medical syringe without the needle, or a Neti Pot.  Hydration:  Drink plenty of liquids (6-8 glasses of water daily). If the air in your home is dry, use a cool mist humidifier  Expected Course:  Sinus congestion from viral upper respiratory infections (colds) usually lasts 5-10 days.  Occasionally a cold can worsen and turn into bacterial sinusitis. Clues to this are sinus symptoms lasting longer than 10 days, fever lasting longer than 3 days, and worsening pain. Bacterial sinusitis may need antibiotic treatment.  Call Back If:   Severe pain lasts longer than 2 hours after pain medicine  Sinus pain lasts longer than 1 day after starting treatment using nasal washes  Sinus congestion (fullness) lasts longer than 10 days  Fever lasts longer than 3 days  You become worse.  Patient Will Follow Care Advice:  YES

## 2014-11-15 NOTE — Telephone Encounter (Signed)
Pt is req a referral to a podiatrist for foot pain for right foot pain

## 2014-11-15 NOTE — Telephone Encounter (Signed)
Dorian Pod with [facility] said pt wants appt with Dr Damita Dunnings for sinus congestion, no fever and has not had symptoms for more than 10 days. Pt will only see Dr Damita Dunnings; Dorian Pod will give appt to pt with Dr Damita Dunnings on 11/17/14 and if pt condition changes or worsens prior to appt pt will cb to office for appt with different provider.

## 2014-11-15 NOTE — Telephone Encounter (Signed)
Noted, thanks!

## 2014-11-17 ENCOUNTER — Ambulatory Visit: Payer: Self-pay | Admitting: Family Medicine

## 2014-11-18 ENCOUNTER — Ambulatory Visit (INDEPENDENT_AMBULATORY_CARE_PROVIDER_SITE_OTHER)
Admission: RE | Admit: 2014-11-18 | Discharge: 2014-11-18 | Disposition: A | Discharge status: Home / Self Care | Payer: Commercial Managed Care - HMO | Source: Ambulatory Visit | Attending: Family Medicine | Admitting: Family Medicine

## 2014-11-18 ENCOUNTER — Encounter: Payer: Self-pay | Admitting: Family Medicine

## 2014-11-18 ENCOUNTER — Ambulatory Visit (INDEPENDENT_AMBULATORY_CARE_PROVIDER_SITE_OTHER): Payer: Commercial Managed Care - HMO | Admitting: Family Medicine

## 2014-11-18 VITALS — BP 108/82 | HR 85 | Temp 97.6°F | Wt 135.8 lb

## 2014-11-18 DIAGNOSIS — J0100 Acute maxillary sinusitis, unspecified: Secondary | ICD-10-CM

## 2014-11-18 DIAGNOSIS — M25512 Pain in left shoulder: Secondary | ICD-10-CM

## 2014-11-18 DIAGNOSIS — M25571 Pain in right ankle and joints of right foot: Secondary | ICD-10-CM

## 2014-11-18 MED ORDER — LANSOPRAZOLE 30 MG PO CPDR: DELAYED_RELEASE_CAPSULE | ORAL | Status: DC

## 2014-11-18 MED ORDER — METOPROLOL SUCCINATE ER 25 MG PO TB24: 25.0000 mg | ORAL_TABLET | Freq: Every day | ORAL | Status: DC

## 2014-11-18 MED ORDER — AMOXICILLIN 875 MG PO TABS: 875.0000 mg | ORAL_TABLET | Freq: Two times a day (BID) | ORAL | Status: AC

## 2014-11-18 MED ORDER — MONTELUKAST SODIUM 10 MG PO TABS: 10.0000 mg | ORAL_TABLET | Freq: Every day | ORAL | Status: DC

## 2014-11-18 MED ORDER — HYDROCODONE-ACETAMINOPHEN 7.5-325 MG PO TABS: ORAL_TABLET | ORAL | Status: DC

## 2014-11-18 NOTE — Progress Notes (Signed)
Pre visit review using our clinic review tool, if applicable. No additional management support is needed unless otherwise documented below in the visit note.  She never really improved from prev.  She finished the abx but still has a lot of nasal discharge, facial pressure (especially leaning over) and sputum production.   She doesn't have chest sx.  No fevers.  R ear pain prev, not now.  No L ear pain.  Some ST, better now.  She has tried mult meds with h/o intolerances.  R foot pain.  She has rubbing between the R 1st and 2nd toe, in the webspace.  She covered with a bandaid, but it is only temporary helpful.  She has a hammertoe and didn't know what could be done to keep the toes from rubbing.  She was asking about getting set up with podiatry.    Shoulder pain for the last 3 night. Left shoulder.  Stinging in the shoulder.  She has been injected prev.  Some radiation down the arm.   "It feels like they have the needle in, giving me a shot, when the pain comes." No trauma, no trigger.   She had been on opiates chronically for post shingles pain on L thorax.    Meds, vitals, and allergies reviewed.   ROS: See HPI.  Otherwise, noncontributory.  GEN: nad, alert and oriented HEENT: mucous membranes moist, tm w/o erythema, nasal exam w/o erythema, clear discharge noted,  OP with cobblestoning, Max > frontal sinus pain B NECK: supple w/o LA CV: rrr.   PULM: ctab, no inc wob EXT: no edema SKIN: no acute rash L shoulder: Pain with external > internal rotation, pain with overhead movement, no arm drop.  Distally nv intact. + impingement.

## 2014-11-18 NOTE — Patient Instructions (Signed)
Go to the lab on the way out.  We'll contact you with your xray report. Rosaria Ferries will call about your referral. Restart the amoxil and let me know if you don't improve.  Take care.  Glad to see you.

## 2014-11-21 DIAGNOSIS — M25512 Pain in left shoulder: Secondary | ICD-10-CM | POA: Insufficient documentation

## 2014-11-21 NOTE — Assessment & Plan Note (Signed)
Restart amoxil, given her hx, she'll update me as needed.  Nontoxic.

## 2014-11-21 NOTE — Assessment & Plan Note (Signed)
xrays reviewed, see notes on images.

## 2015-03-07 DIAGNOSIS — D485 Neoplasm of uncertain behavior of skin: Secondary | ICD-10-CM | POA: Diagnosis not present

## 2015-03-07 DIAGNOSIS — L219 Seborrheic dermatitis, unspecified: Secondary | ICD-10-CM | POA: Diagnosis not present

## 2015-03-07 DIAGNOSIS — L821 Other seborrheic keratosis: Secondary | ICD-10-CM | POA: Diagnosis not present

## 2015-03-07 DIAGNOSIS — D0439 Carcinoma in situ of skin of other parts of face: Secondary | ICD-10-CM | POA: Diagnosis not present

## 2015-03-07 DIAGNOSIS — B009 Herpesviral infection, unspecified: Secondary | ICD-10-CM | POA: Diagnosis not present

## 2015-03-07 DIAGNOSIS — C4492 Squamous cell carcinoma of skin, unspecified: Secondary | ICD-10-CM

## 2015-03-07 HISTORY — DX: Squamous cell carcinoma of skin, unspecified: C44.92

## 2015-03-25 ENCOUNTER — Telehealth: Payer: Self-pay | Admitting: Family Medicine

## 2015-03-25 DIAGNOSIS — C44329 Squamous cell carcinoma of skin of other parts of face: Secondary | ICD-10-CM | POA: Diagnosis not present

## 2015-03-25 DIAGNOSIS — L820 Inflamed seborrheic keratosis: Secondary | ICD-10-CM | POA: Diagnosis not present

## 2015-03-25 MED ORDER — HYDROCODONE-ACETAMINOPHEN 7.5-325 MG PO TABS: ORAL_TABLET | ORAL | Status: DC

## 2015-03-25 NOTE — Telephone Encounter (Signed)
Pt walked in with request to refill HYDROcodone-acetaminophen (NORCO) 7.5-325 MG per tablet [[id number]]  Last refill for 270 tablet was 11/18/2014.  Pt is leaving town today.

## 2015-03-25 NOTE — Telephone Encounter (Signed)
Printed

## 2015-04-25 DIAGNOSIS — L821 Other seborrheic keratosis: Secondary | ICD-10-CM | POA: Diagnosis not present

## 2015-04-25 DIAGNOSIS — Z85828 Personal history of other malignant neoplasm of skin: Secondary | ICD-10-CM | POA: Diagnosis not present

## 2015-04-25 DIAGNOSIS — L570 Actinic keratosis: Secondary | ICD-10-CM | POA: Diagnosis not present

## 2015-04-28 ENCOUNTER — Encounter: Payer: Self-pay | Admitting: Family Medicine

## 2015-04-28 ENCOUNTER — Ambulatory Visit (INDEPENDENT_AMBULATORY_CARE_PROVIDER_SITE_OTHER): Payer: Commercial Managed Care - HMO | Admitting: Family Medicine

## 2015-04-28 VITALS — BP 138/82 | HR 89 | Temp 98.5°F | Wt 134.2 lb

## 2015-04-28 DIAGNOSIS — Z1211 Encounter for screening for malignant neoplasm of colon: Secondary | ICD-10-CM | POA: Diagnosis not present

## 2015-04-28 DIAGNOSIS — J0100 Acute maxillary sinusitis, unspecified: Secondary | ICD-10-CM | POA: Diagnosis not present

## 2015-04-28 DIAGNOSIS — Z1239 Encounter for other screening for malignant neoplasm of breast: Secondary | ICD-10-CM

## 2015-04-28 DIAGNOSIS — B0229 Other postherpetic nervous system involvement: Secondary | ICD-10-CM

## 2015-04-28 DIAGNOSIS — M533 Sacrococcygeal disorders, not elsewhere classified: Secondary | ICD-10-CM

## 2015-04-28 MED ORDER — AMOXICILLIN 875 MG PO TABS: 875.0000 mg | ORAL_TABLET | Freq: Two times a day (BID) | ORAL | Status: AC

## 2015-04-28 NOTE — Assessment & Plan Note (Signed)
D/w pt.  This is usually a slow to heal issue.  Can use a soft pillow, avoid hard chairs.  Can take tylenol if needed (she only has 2x325mg  in the usual BID vicodin rx).  She can take an extra 1/2 vicodin if really painful.  She agrees.

## 2015-04-28 NOTE — Assessment & Plan Note (Signed)
Referral placed. FH noted.

## 2015-04-28 NOTE — Assessment & Plan Note (Signed)
She'll call about scheduling mammogram. FH noted.

## 2015-04-28 NOTE — Assessment & Plan Note (Addendum)
Continue baseline meds, add on amoxil. If she has another round of recurrent sx, we may need to get her back to see the ENT clinic. She agrees that may be needed. We'll see how she does in the meantime.  Nontoxic, okay for outpatient f/u. >25 minutes spent in face to face time with patient, >50% spent in counselling or coordination of care.

## 2015-04-28 NOTE — Patient Instructions (Addendum)
We can do the other/new pneumonia shot when you are feeling better.  Check with your insurance to see if they will cover the shingles shot. Call about a mammogram.   See Rosaria Ferries on the way out.  Take tylenol for the tailbone pain.  Start the amoxil today.   Take care.  Glad to see you.

## 2015-04-28 NOTE — Progress Notes (Signed)
Pre visit review using our clinic review tool, if applicable. No additional management support is needed unless otherwise documented below in the visit note.  Tailbone pain.  Had some prolonged sitting on a hard chair previously.  No other trama.  Tender at the end of the coccyx.   Sinus pain for 3 weeks.  Pain with leaning over.  Facial pain.  No fevers.  H/o similar prev.  She had seen ENT in the distant past, not recently.  He had gone about 6 month w/o sx overall.    Due for mammogram.  D/w pt.  She can call to schedule. Numbers given to patient.   D/w patient [id number] for colon cancer screening, ie colonoscopy.  Risks and benefits were discussed and patient voiced understanding.  She had seen Dr. Army Chaco.  Due for f/u.    Meds, vitals, and allergies reviewed.   ROS: See HPI.  Otherwise, noncontributory.  GEN: nad, alert and oriented HEENT: mucous membranes moist, tm w/o erythema, nasal exam w/o erythema, clear discharge noted,  OP with cobblestoning, sinuses ttp NECK: supple w/o LA CV: rrr.   PULM: ctab, no inc wob EXT: no edema SKIN: no acute rash Distal coccyx ttp, at the midline

## 2015-04-28 NOTE — Assessment & Plan Note (Signed)
Still with L sided thorax pain at baseline.  Continue prn vicodin.

## 2015-05-10 ENCOUNTER — Ambulatory Visit
Admission: RE | Admit: 2015-05-10 | Discharge: 2015-05-10 | Disposition: A | Discharge status: Home / Self Care | Payer: Commercial Managed Care - HMO | Source: Ambulatory Visit | Attending: Family Medicine | Admitting: Family Medicine

## 2015-05-10 ENCOUNTER — Other Ambulatory Visit: Payer: Self-pay | Admitting: Family Medicine

## 2015-05-10 DIAGNOSIS — Z1231 Encounter for screening mammogram for malignant neoplasm of breast: Secondary | ICD-10-CM

## 2015-05-11 ENCOUNTER — Encounter: Payer: Self-pay | Admitting: *Deleted

## 2015-05-31 ENCOUNTER — Other Ambulatory Visit: Payer: Self-pay | Admitting: Family Medicine

## 2015-05-31 NOTE — Telephone Encounter (Signed)
Electronic refill request. Last Filled:    90 tablet 2 RF on 09/07/2014  Please advise.

## 2015-06-01 ENCOUNTER — Other Ambulatory Visit: Payer: Self-pay | Admitting: Family Medicine

## 2015-06-01 NOTE — Telephone Encounter (Signed)
Rx called to pharmacy as instructed. 

## 2015-06-01 NOTE — Telephone Encounter (Signed)
Please call in.  Thanks.   

## 2015-06-09 ENCOUNTER — Ambulatory Visit: Payer: Commercial Managed Care - HMO | Admitting: Obstetrics & Gynecology

## 2015-06-09 VITALS — BP 140/80 | HR 92 | Ht 59.0 in | Wt 136.0 lb

## 2015-06-09 DIAGNOSIS — N812 Incomplete uterovaginal prolapse: Secondary | ICD-10-CM

## 2015-06-09 NOTE — Progress Notes (Signed)
Feels like she has a prolapse.  Vagina is irritated and dry, no discharge, no odor.

## 2015-07-01 ENCOUNTER — Ambulatory Visit (INDEPENDENT_AMBULATORY_CARE_PROVIDER_SITE_OTHER): Payer: Commercial Managed Care - HMO | Admitting: Nurse Practitioner

## 2015-07-01 ENCOUNTER — Encounter: Payer: Self-pay | Admitting: Nurse Practitioner

## 2015-07-01 VITALS — BP 138/78 | HR 92 | Temp 98.0°F | Resp 14 | Ht 59.0 in | Wt 135.8 lb

## 2015-07-01 DIAGNOSIS — Z418 Encounter for other procedures for purposes other than remedying health state: Secondary | ICD-10-CM | POA: Diagnosis not present

## 2015-07-01 DIAGNOSIS — Z23 Encounter for immunization: Secondary | ICD-10-CM | POA: Diagnosis not present

## 2015-07-01 DIAGNOSIS — M25511 Pain in right shoulder: Secondary | ICD-10-CM | POA: Diagnosis not present

## 2015-07-01 DIAGNOSIS — Z299 Encounter for prophylactic measures, unspecified: Secondary | ICD-10-CM

## 2015-07-01 MED ORDER — TIZANIDINE HCL 4 MG PO TABS: 4.0000 mg | ORAL_TABLET | Freq: Every day | ORAL | Status: DC

## 2015-07-01 NOTE — Assessment & Plan Note (Signed)
Pt still concerned about right shoulder pain, she will be re-referred to ortho to see Dr. Jefm Bryant. She reports Norco is not helpful, will try tizanidine and ice. Asked her to rest the shoulder and try the tizanidine at night only. FU prn worsening/failure to improve.

## 2015-07-01 NOTE — Addendum Note (Signed)
Addended by: Rubbie Battiest on: 07/01/2015 01:56 PM   Modules accepted: Level of Service

## 2015-07-01 NOTE — Addendum Note (Signed)
Addended by: Cheyenne Adas A on: 07/01/2015 02:05 PM   Modules accepted: Orders

## 2015-07-01 NOTE — Patient Instructions (Signed)
Rest, cool pack or ice on shoulder and try the muscle relaxer at night.   We will call you to set up your appointment

## 2015-07-01 NOTE — Progress Notes (Signed)
   Subjective:    Patient ID: Jillian Oconnell, female    DOB: 1934-05-09, 81 y.o.   MRN: [id number]  HPI  [name] is a 81 yo female with a CC of right arm pain.   1) Right arm- feels she cannot lift her arm well, denies trauma, hurts around the shoulder blade x 3 weeks.  Not tried ice  Treatment to date: Tylenol- not any extra  Norco- not helpful.  Injections- Dr. Jefm Bryant- reports this was awhile back   Talked about surgery- put off surgery  X-ray 11/09/13- Probable calcific tendonitis of supraspinatus tendon near greater tuberosity. No fx or dislocation visualized.   Review of Systems  Constitutional: Negative for fever, chills, diaphoresis and fatigue.  Respiratory: Negative for chest tightness, shortness of breath and wheezing.   Cardiovascular: Negative for chest pain, palpitations and leg swelling.  Gastrointestinal: Negative for nausea, vomiting and diarrhea.  Musculoskeletal: Positive for myalgias and arthralgias. Negative for back pain, joint swelling and gait problem.  Skin: Negative for rash.  Neurological: Negative for dizziness, weakness, numbness and headaches.  Psychiatric/Behavioral: The patient is not nervous/anxious.       Objective:   Physical Exam  Constitutional: She is oriented to person, place, and time. She appears well-developed and well-nourished. No distress.  BP 138/78 mmHg  Pulse 92  Temp(Src) 98 F (36.7 C)  Resp 14  Ht 4\' 11"  (1.499 m)  Wt 135 lb 12.8 oz (61.598 kg)  BMI 27.41 kg/m2  SpO2 96%   HENT:  Head: Normocephalic and atraumatic.  Right Ear: External ear normal.  Left Ear: External ear normal.  Musculoskeletal: She exhibits tenderness. She exhibits no edema.  Neurological: She is alert and oriented to person, place, and time. No cranial nerve deficit or sensory deficit. She exhibits abnormal muscle tone. Coordination normal.  Decreased ROM of right arm, tenderness at the shoulder blade, trapezius, and Right 4/5 strength of deltoid,  5/5 on left deltoid, 5/5 bilateral bicep, grip strength equal bilaterally.   Skin: Skin is warm and dry. No rash noted. She is not diaphoretic.  Psychiatric: She has a normal mood and affect. Her behavior is normal. Judgment and thought content normal.      Assessment & Plan:

## 2015-07-04 DIAGNOSIS — H3531 Nonexudative age-related macular degeneration: Secondary | ICD-10-CM | POA: Diagnosis not present

## 2015-07-04 DIAGNOSIS — Z961 Presence of intraocular lens: Secondary | ICD-10-CM | POA: Diagnosis not present

## 2015-07-11 ENCOUNTER — Encounter: Payer: Self-pay | Admitting: Obstetrics & Gynecology

## 2015-07-18 ENCOUNTER — Other Ambulatory Visit: Payer: Self-pay

## 2015-07-18 MED ORDER — HYDROCODONE-ACETAMINOPHEN 7.5-325 MG PO TABS: ORAL_TABLET | ORAL | Status: DC

## 2015-07-18 NOTE — Telephone Encounter (Signed)
Printed.  Thanks.  

## 2015-07-18 NOTE — Telephone Encounter (Signed)
Pt left v/m requesting rx hydrocodone apap. Call when ready for pick up. Pt request to pick up rx on 07/20/15. rx last printed # 270 on 03/25/2015.Marland KitchenPlease advise.

## 2015-07-19 NOTE — Telephone Encounter (Signed)
Patient advised.  Rx left at front desk for pick up. 

## 2015-07-20 ENCOUNTER — Encounter: Payer: Self-pay | Admitting: Family Medicine

## 2015-07-20 ENCOUNTER — Ambulatory Visit (INDEPENDENT_AMBULATORY_CARE_PROVIDER_SITE_OTHER): Payer: Commercial Managed Care - HMO | Admitting: Family Medicine

## 2015-07-20 VITALS — BP 120/72 | HR 86 | Temp 97.7°F | Ht 59.0 in | Wt 135.8 lb

## 2015-07-20 DIAGNOSIS — M75101 Unspecified rotator cuff tear or rupture of right shoulder, not specified as traumatic: Secondary | ICD-10-CM | POA: Diagnosis not present

## 2015-07-20 DIAGNOSIS — M7541 Impingement syndrome of right shoulder: Secondary | ICD-10-CM | POA: Diagnosis not present

## 2015-07-20 MED ORDER — METHYLPREDNISOLONE ACETATE 40 MG/ML IJ SUSP
80.0000 mg | Freq: Once | INTRAMUSCULAR | Status: AC
  Administered 2015-07-20: 80 mg via INTRA_ARTICULAR

## 2015-07-20 NOTE — Progress Notes (Signed)
Pre visit review using our clinic review tool, if applicable. No additional management support is needed unless otherwise documented below in the visit note. 

## 2015-07-20 NOTE — Progress Notes (Signed)
Dr. Frederico Hamman T. Kayan Blissett, MD, [facility] [place], [address] Phone: [phone] Fax: [phone]  07/20/2015  Patient: Jillian Oconnell, MRN: [id number], DOB: 1934-11-29, 81 y.o.  Primary Physician:  Elsie Stain, MD  Chief Complaint: Shoulder Pain  Subjective:   Jillian Oconnell is a 81 y.o. very pleasant female patient who presents with the following:  R shoulder pain ongoing for months, has seen [facility].   I reviewed his notes, and he felt that she was having some impingement and she did have some what sounds like some subacromial spurring.  At one point they discussed potentially having a subacromial decompression, but the patient did not proceed with surgery.  I do not have any records for my independent review regarding her shoulder radiographs, but Dr. Jefm Bryant notes that she has no significant glenohumeral arthritis.  She is primarily having some pain with reaching overhead and occasionally with terminal internal range of motion at this point.  This is all in her right shoulder.  No trauma or accident.  Past Medical History, Surgical History, Social History, Family History, Problem List, Medications, and Allergies have been reviewed and updated if relevant.  Patient Active Problem List   Diagnosis Date Noted  . Breast cancer screening 04/28/2015  . Colon cancer screening 04/28/2015  . Coccygeal pain 04/28/2015  . Left shoulder pain 11/21/2014  . Acute sinusitis 10/10/2014  . Knee pain 09/08/2014  . Wrist pain 08/01/2014  . Paroxysmal supraventricular tachycardia   . Pain in joint, shoulder region 11/09/2013  . Grief 09/19/2013  . Exertional chest pain 01/06/2013  . Back pain 10/12/2012  . Leg pain 06/09/2012  . Dysuria 05/02/2012  . CARPAL TUNNEL SYNDROME 01/09/2011  . SKIN LESION 01/09/2011  . OSTEOPENIA 09/06/2010  . PALPITATIONS 04/18/2010  . DEPRESSION 03/16/2009  . 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  HYPERLIPIDEMIA 02/23/2009  . ANXIETY 02/23/2009  . HYPERTENSION 02/23/2009  . ALLERGIC RHINITIS 02/23/2009  . ASTHMA 02/23/2009  . GERD 02/23/2009    Past Medical History  Diagnosis Date  . Allergy   . Asthma   . GERD (gastroesophageal reflux disease)   . Depression   . Palpitations   . Hyperlipidemia   . Hypertension   . OA (osteoarthritis) of knee     injections  . Osteopenia     DXA 2011  . Glaucoma   . 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 Shingles     post herpetic neuralgia (pain clinic in past)  . IBS (irritable bowel syndrome)   . Anxiety   . Paroxysmal supraventricular tachycardia May of 2050    Past Surgical History  Procedure Laterality Date  . Gallbladder surgery  1997  . Rectocele repair  2004  . Total abdominal hysterectomy    . Cardiac catheterization  01/2008    65% ;[facility]. Minor luminal irregularities with no evidence of obstructive disease.  . Cataract surgery    . Breast biopsy Right 1988    History   Social History  . Marital Status: Married    Spouse Name: N/A  . Number of Children: N/A  . Years of Education: N/A   Occupational History  . Retired     Agricultural engineer   Social History Main Topics  . Smoking status: Never Smoker   . Smokeless tobacco: Never Used  . Alcohol Use: No  . Drug Use:  No  . Sexual Activity: Not on file   Other Topics Concern  . Not on file   Social History Narrative   Regular exercise- no    Moved here from [place]- to be closer to family    Husband died of lung CA 04-15-2013    Family History  Problem Relation Age of Onset  . Hypertension Mother   . Breast cancer Mother   . Cancer Sister     colon  . Cancer Daughter     thyroid  . Anxiety disorder Daughter   . Breast cancer Daughter   . Hypertension Father   . Aneurysm Father     AAA    Allergies  Allergen Reactions  . Albuterol     REACTION: jittery  . Alendronate Sodium     REACTION: GI side eff  . Amoxicillin-Pot  Clavulanate     REACTION: non tolerant- but can take plain amox  . Atorvastatin   . Azithromycin     GI upset.  Not an allergy.  . Doxycycline     diarrhea  . Ezetimibe-Simvastatin     REACTION: myalgia  . Gabapentin     REACTION: tremor  . Ibandronate Sodium     REACTION: GI side eff  . Lexapro [Escitalopram Oxalate]     sedation  . Risedronate Sodium     REACTION: GI side eff  . Rosuvastatin     REACTION: myalgia  . Sertraline     sedation  . Sulfonamide Derivatives     REACTION: non tolerate    Medication list reviewed and updated in full in [facility].  GEN: No fevers, chills. Nontoxic. Primarily MSK c/o today. MSK: Detailed in the HPI GI: tolerating PO intake without difficulty Neuro: No numbness, parasthesias, or tingling associated. Otherwise the pertinent positives of the ROS are noted above.   Objective:   BP 120/72 mmHg  Pulse 86  Temp(Src) 97.7 F (36.5 C) (Oral)  Ht 4\' 11"  (1.499 m)  Wt 135 lb 12 oz (61.576 kg)  BMI 27.40 kg/m2   GEN: Well-developed,well-nourished,in no acute distress; alert,appropriate and cooperative throughout examination HEENT: Normocephalic and atraumatic without obvious abnormalities. Ears, externally no deformities PULM: Breathing comfortably in no respiratory distress EXT: No clubbing, cyanosis, or edema PSYCH: Normally interactive. Cooperative during the interview. Pleasant. Friendly and conversant. Not anxious or depressed appearing. Normal, full affect.   Shoulder: R Inspection: No muscle wasting or winging Ecchymosis/edema: neg  AC joint, scapula, clavicle: NT Cervical spine: NT, full ROM Spurling's: neg Abduction: full, 5/5 Flexion: full, 5/5 IR, full, lift-off: 5/5 ER at neutral: full, 5/5 AC crossover: neg Neer: pos Hawkins: pos Drop Test: neg Empty Can: pos Supraspinatus insertion: mild-mod T Bicipital groove: NT Speed's: neg Yergason's: neg Sulcus sign: neg Scapular dyskinesis: none C5-T1  intact  Neuro: Sensation intact Grip 5/5   Radiology: No results found.  Assessment and Plan:   Impingement syndrome of right shoulder - Plan: methylPREDNISolone acetate (DEPO-MEDROL) injection 80 mg  Rotator cuff impingement syndrome, right [M75.101]  Shoulder impingement syndrome, right [M75.41]  Retraining shoulder mechanics and function was emphasized to the patient  The patient could benefit from formal PT to assist with scapular stabilization and RTC strengthening if sx persist.   SubAC Injection, R Verbal consent was obtained from the patient. Risks (including rare infection), benefits, and alternatives were explained. Patient prepped with Chloraprep and Ethyl Chloride used for anesthesia. The subacromial space was injected using the posterior approach. The patient tolerated the procedure  well and had decreased pain post injection. No complications. Injection: 8 cc of Lidocaine 1% and Depo-Medrol 80 mg. Needle: 22 gauge   Follow-up: Return if symptoms worsen or fail to improve.  Signed,  Maud Deed. Smriti Barkow, MD   Patient's Medications  New Prescriptions   No medications on file  Previous Medications   ALBUTEROL (PROVENTIL HFA;VENTOLIN HFA) 108 (90 BASE) MCG/ACT INHALER    Inhale 1 puff into the lungs every 6 (six) hours as needed for wheezing.   CALCIUM CARBONATE-VITAMIN D (CALTRATE 600+D) 600-400 MG-UNIT PER TABLET    Take 1 tablet by mouth two times a day   FLUTICASONE (FLONASE) 50 MCG/ACT NASAL SPRAY    Place 2 sprays into the nose daily.   FLUTICASONE PROPIONATE, INHAL, (FLOVENT DISKUS) 250 MCG/BLIST AEPB    INHALE ONE PUFF BY MOUTH TWICE DAILY, RINSE MOUTH AFTER USE   HYDROCODONE-ACETAMINOPHEN (NORCO) 7.5-325 MG PER TABLET    TAKE ONE TABLET BY MOUTH EVERY 8 HOURS AS NEEDED FOR PAIN   LANSOPRAZOLE (PREVACID) 30 MG CAPSULE    TAKE ONE CAPSULE BY MOUTH EVERY DAY   LORATADINE (CLARITIN) 10 MG TABLET    Take 10 mg by mouth daily.   LORAZEPAM (ATIVAN) 1 MG TABLET     TAKE ONE TABLET BY MOUTH TWICE DAILY TO THREE TIMES DAILY FOR ANXIETY   METOPROLOL SUCCINATE (TOPROL-XL) 25 MG 24 HR TABLET    Take 1 tablet (25 mg total) by mouth daily.   MONTELUKAST (SINGULAIR) 10 MG TABLET    Take 1 tablet (10 mg total) by mouth at bedtime.  Modified Medications   No medications on file  Discontinued Medications   TIZANIDINE (ZANAFLEX) 4 MG TABLET    Take 1 tablet (4 mg total) by mouth at bedtime.

## 2015-10-12 ENCOUNTER — Encounter: Payer: Self-pay | Admitting: Family Medicine

## 2015-10-12 ENCOUNTER — Telehealth: Payer: Self-pay | Admitting: Family Medicine

## 2015-10-12 ENCOUNTER — Ambulatory Visit (INDEPENDENT_AMBULATORY_CARE_PROVIDER_SITE_OTHER): Payer: Commercial Managed Care - HMO | Admitting: Family Medicine

## 2015-10-12 ENCOUNTER — Telehealth: Payer: Self-pay | Admitting: *Deleted

## 2015-10-12 VITALS — BP 130/80 | HR 86 | Temp 98.3°F | Wt 134.5 lb

## 2015-10-12 DIAGNOSIS — J0100 Acute maxillary sinusitis, unspecified: Secondary | ICD-10-CM

## 2015-10-12 DIAGNOSIS — R002 Palpitations: Secondary | ICD-10-CM

## 2015-10-12 MED ORDER — METOPROLOL SUCCINATE ER 25 MG PO TB24: 25.0000 mg | ORAL_TABLET | Freq: Every day | ORAL | Status: DC

## 2015-10-12 MED ORDER — LORATADINE 10 MG PO TABS: 10.0000 mg | ORAL_TABLET | Freq: Every day | ORAL | Status: DC

## 2015-10-12 MED ORDER — AMOXICILLIN 875 MG PO TABS: 875.0000 mg | ORAL_TABLET | Freq: Two times a day (BID) | ORAL | Status: AC

## 2015-10-12 NOTE — Progress Notes (Signed)
Pre visit review using our clinic review tool, if applicable. No additional management support is needed unless otherwise documented below in the visit note.  BP was up at home.  BP okay at OV, d/w pt.   Sinus pain, facial pain.  Recently started. Intol of mult meds, d/w pt.  HA.  Sx started a few days ago.  This is typical for her when she has had sinus infections prev. This is her typical allergy season.  No cough.  Some postnasal gtt.    She has noted occ episodes of heart racing without chest pain.  Episodes can last part of the day.  Tends to self resolve.  This tends to happen with high stress times.  Sx had been happening intermittently for a while now, ie not acute sx.    Meds, vitals, and allergies reviewed.   ROS: See HPI.  Otherwise, noncontributory.  GEN: nad, alert and oriented HEENT: mucous membranes moist, tm w/o erythema, nasal exam w/o erythema, clear discharge noted,  OP with cobblestoning, max sinuses ttp NECK: supple w/o LA CV: rrr.   PULM: ctab, no inc wob EXT: no edema SKIN: no acute rash

## 2015-10-12 NOTE — Telephone Encounter (Signed)
error 

## 2015-10-12 NOTE — Patient Instructions (Signed)
Start amoxil today.  You can take an extra metoprolol if needed for palpitations.  If you keep having trouble, then I want you to update me or see the heart clinic.  Take care.  Glad to see you.

## 2015-10-12 NOTE — Telephone Encounter (Signed)
Pt has appt today with Dr. Damita Dunnings   PLEASE NOTE: All timestamps contained within this report are represented as [place] Standard Time. CONFIDENTIALTY NOTICE: This fax transmission is intended only for the addressee. It contains information that is legally privileged, confidential or otherwise protected from use or disclosure. If you are not the intended recipient, you are strictly prohibited from reviewing, disclosing, copying using or disseminating any of this information or taking any action in reliance on or regarding this information. If you have received this fax in error, please notify us immediately by telephone so that we can arrange for its return to [place]. Phone: [phone], Toll-Free: [phone], Fax: [phone] Page: 1 of 2 Call Id: [id number] [place] Patient Name: Jillian Oconnell Gender: Female DOB: [birth year] Age: 81 Y 10 M 14 D Return Phone Number: [phone] (Primary) Address: City/State/Zip: [place] Client [facility] - Client Client Site [place] - Day Physician Renford Dills Contact Type Call Call Type Triage / Clinical Relationship To Patient Self Appointment Disposition EMR Patient Reports Appointment Already Scheduled Info pasted into Epic Yes Return Phone Number [phone] (Primary) Chief Complaint Blood Pressure High Initial Comment Caller states her bp is elevated, 143/96 not on bp meds, possible sinus infection PreDisposition Call Doctor Nurse Assessment Nurse: Mechele Dawley, RN, Amy Date/Time Eilene Ghazi Time): 10/12/2015 9:28:18 AM Confirm and document reason for call. If symptomatic, describe symptoms. ---CALLER STATES THAT SHE IS HAVING ELEVATED BP. SHE IS ON METOPROLOL - SHE HAS A IRREGULAR HEARTBEAT. SHE IS HAVING BP OF 143/96. SHE HAS A SINUS INFECTION. SHE TAKES CLARITIN OTC. SHE TAKES SINGULAIR MEDS. SHE TAKES  FLONASE AS WELL. SHE IS NOT SEEMING TO HAVE THE SINUS INFECTION CLEARED. FALL IS A HARD TIME FOR HER. COLD AIR HITS HER FACE. Has the patient traveled out of the country within the last 30 days? ---Not Applicable Does the patient have any new or worsening symptoms? ---Yes Will a triage be completed? ---Yes Related visit to physician within the last 2 weeks? ---No Does the PT have any chronic conditions? (i.e. diabetes, asthma, etc.) ---Yes List chronic conditions. ---IRREGULAR HEARTBEAT, SINUS ISSUES Guidelines Guideline Title Affirmed Question Affirmed Notes Nurse Date/Time (Eastern Time) High Blood Pressure [1] BP # 140/90 AND [2] taking BP medications [place], RN, Amy 10/12/2015 9:31:10 AM Disp. Time Eilene Ghazi Time) Disposition Final User 10/12/2015 9:31:48 AM See PCP within 2 Weeks Yes [place], RN, Amy PLEASE NOTE: All timestamps contained within this report are represented as [place] Standard Time. CONFIDENTIALTY NOTICE: This fax transmission is intended only for the addressee. It contains information that is legally privileged, confidential or otherwise protected from use or disclosure. If you are not the intended recipient, you are strictly prohibited from reviewing, disclosing, copying using or disseminating any of this information or taking any action in reliance on or regarding this information. If you have received this fax in error, please notify us immediately by telephone so that we can arrange for its return to [place]. Phone: [phone], Toll-Free: [phone], Fax: [phone] Page: 2 of 2 Call Id: [id number] Caller Understands: Yes Disagree/Comply: Comply Care Advice Given Per Guideline SEE PCP WITHIN 2 WEEKS: You need an evaluation for this ongoing problem within the next 2 weeks. Call your doctor during regular office hours and make an appointment. HYPERTENSION MEDICATIONS: CALL BACK IF: * Weakness or numbness of the face, arm or leg on one side of the body occurs *  Chest pain or difficulty breathing occurs * You become worse. After Care Instructions Given Call Event Type User Date / Time Description

## 2015-10-13 NOTE — Assessment & Plan Note (Signed)
Can take an extra dose of metoprolol if needed and update either me or cards clinic.  She agrees.  Routine cautions d/w pt.

## 2015-10-13 NOTE — Assessment & Plan Note (Signed)
Amoxil, rest and fluids, nontoxic.  She agrees.

## 2015-10-31 DIAGNOSIS — L821 Other seborrheic keratosis: Secondary | ICD-10-CM | POA: Diagnosis not present

## 2015-10-31 DIAGNOSIS — L570 Actinic keratosis: Secondary | ICD-10-CM | POA: Diagnosis not present

## 2015-10-31 DIAGNOSIS — Z85828 Personal history of other malignant neoplasm of skin: Secondary | ICD-10-CM | POA: Diagnosis not present

## 2015-11-14 ENCOUNTER — Other Ambulatory Visit: Payer: Self-pay | Admitting: Family Medicine

## 2015-11-24 ENCOUNTER — Other Ambulatory Visit: Payer: Self-pay | Admitting: Family Medicine

## 2015-11-25 ENCOUNTER — Other Ambulatory Visit: Payer: Self-pay

## 2015-11-25 ENCOUNTER — Other Ambulatory Visit: Payer: Self-pay | Admitting: Family Medicine

## 2015-11-25 MED ORDER — HYDROCODONE-ACETAMINOPHEN 7.5-325 MG PO TABS: ORAL_TABLET | ORAL | Status: DC

## 2015-11-25 NOTE — Telephone Encounter (Signed)
Left detailed message on voicemail. Rx left at front desk for pick up.  

## 2015-11-25 NOTE — Telephone Encounter (Signed)
Printed.  Thanks.  

## 2015-11-25 NOTE — Telephone Encounter (Signed)
Pt left v/m requesting rx hydrocodone apap. Call when ready for pick up. rx last printed # [phone] on 07/18/15. Last seen 10/12/15.

## 2015-12-15 ENCOUNTER — Other Ambulatory Visit: Payer: Self-pay | Admitting: Family Medicine

## 2015-12-15 NOTE — Telephone Encounter (Signed)
Pt request status of nasal spray; advised already refilled and pt will ck with pharmacy.

## 2016-01-09 ENCOUNTER — Encounter: Payer: Self-pay | Admitting: Primary Care

## 2016-01-09 ENCOUNTER — Ambulatory Visit (INDEPENDENT_AMBULATORY_CARE_PROVIDER_SITE_OTHER): Payer: Commercial Managed Care - HMO | Admitting: Primary Care

## 2016-01-09 ENCOUNTER — Telehealth: Payer: Self-pay | Admitting: Family Medicine

## 2016-01-09 VITALS — BP 126/78 | HR 76 | Temp 97.9°F | Ht 59.0 in | Wt 136.4 lb

## 2016-01-09 DIAGNOSIS — R002 Palpitations: Secondary | ICD-10-CM | POA: Diagnosis not present

## 2016-01-09 DIAGNOSIS — I499 Cardiac arrhythmia, unspecified: Secondary | ICD-10-CM | POA: Diagnosis not present

## 2016-01-09 LAB — COMPREHENSIVE METABOLIC PANEL
ALBUMIN: 4.2 g/dL (ref 3.5–5.2)
ALT: 11 U/L (ref 0–35)
AST: 21 U/L (ref 0–37)
Alkaline Phosphatase: 56 U/L (ref 39–117)
BILIRUBIN TOTAL: 0.4 mg/dL (ref 0.2–1.2)
BUN: 20 mg/dL (ref 6–23)
CHLORIDE: 100 meq/L (ref 96–112)
CO2: 32 mEq/L (ref 19–32)
CREATININE: 0.99 mg/dL (ref 0.40–1.20)
Calcium: 9.6 mg/dL (ref 8.4–10.5)
GFR: 57.49 mL/min — ABNORMAL LOW (ref >60.00)
Glucose, Bld: 93 mg/dL (ref 70–99)
Potassium: 4.6 mEq/L (ref 3.5–5.1)
SODIUM: 139 meq/L (ref 135–145)
Total Protein: 7.2 g/dL (ref 6.0–8.3)

## 2016-01-09 LAB — CBC
HEMATOCRIT: 45.7 % (ref 36.0–46.0)
Hemoglobin: 14.8 g/dL (ref 12.0–15.0)
MCHC: 32.3 g/dL (ref 30.0–36.0)
MCV: 90.1 fl (ref 78.0–100.0)
Platelets: 228 10*3/uL (ref 150.0–400.0)
RBC: 5.08 Mil/uL (ref 3.87–5.11)
RDW: 13.8 % (ref 11.5–15.5)
WBC: 6.9 10*3/uL (ref 4.0–10.5)

## 2016-01-09 LAB — TSH: TSH: 3.65 u[IU]/mL (ref 0.35–4.50)

## 2016-01-09 NOTE — Progress Notes (Signed)
Pre visit review using our clinic review tool, if applicable. No additional management support is needed unless otherwise documented below in the visit note. 

## 2016-01-09 NOTE — Telephone Encounter (Addendum)
Pt has appt 01/16/16 at 2 PM with [facility] NP. Evidently the computer jumped when [facility] scheduled appt for today at 2 pm to 01/16/16 at 2 PM with Avie Echevaria NP. Cancelled that appt and scheduled with Allie Bossier NP 01/09/16 at 2:45. Pt and [name] voiced understanding.

## 2016-01-09 NOTE — Assessment & Plan Note (Signed)
PVC's noted during exam today, every 4-5 beats. Also captured on ECG. ECG otherwise unremarkable. TSH, CBC, CMP pending. Referral placed to her prior cardiologist per her request.

## 2016-01-09 NOTE — Patient Instructions (Addendum)
Your ECG shows pre-ventricular heart beats.  Complete lab work prior to leaving today. I will notify you of your results once received.   You will be contacted regarding your referral to Cardiology.  Please let us know if you have not heard back within one week.   Please call me if you develop severe chest pain, shortness of breath, fevers.  It was a pleasure meeting you!

## 2016-01-09 NOTE — Progress Notes (Signed)
Subjective:    Patient ID: Jillian Oconnell, female    DOB: [birth year], 82 y.o.   MRN: [id number]  HPI  Jillian Oconnell is a 82 year old female who presents today with a chief complaint of irregular heart beat. She feels as though her heart is skipping beats multiple times daily. This has been ongoing for years, but has noticed more frequently over the past 2 years since her husbands passing.  She's had complete evaluation with Holter Monitor, echocardiogram in 2013/03/28, and cardiac catheterization in Mar 28, 2008. She is currently managed on alprazolam 0.5 mg BID as needed for anxiety. She will notice the skipping heart beat which will cause anxiety. Denies chest pain. She does experience occasional shortness of breath. She is requesting a referral to the cardiologist she saw in 03/28/14 (Dr. Fletcher Anon).   Review of Systems  Constitutional: Negative for fever and unexpected weight change.  Respiratory: Negative for shortness of breath.   Cardiovascular: Positive for palpitations.       Irregular heart beat  Neurological: Negative for dizziness and headaches.       Past Medical History  Diagnosis Date  . Allergy   . Asthma   . GERD (gastroesophageal reflux disease)   . Depression   . Palpitations   . Hyperlipidemia   . Hypertension   . OA (osteoarthritis) of knee     injections  . Osteopenia     DXA 2010-03-28  . Glaucoma   . 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 Shingles     post herpetic neuralgia (pain clinic in past)  . IBS (irritable bowel syndrome)   . Anxiety   . Paroxysmal supraventricular tachycardia ([place]) May of 2050    Social History   Social History  . Marital Status: Married    Spouse Name: N/A  . Number of Children: N/A  . Years of Education: N/A   Occupational History  . Retired     Agricultural engineer   Social History Main Topics  . Smoking status: Never Smoker   . Smokeless tobacco: Never Used  . Alcohol Use: No  . Drug Use: No  . Sexual Activity: Not on  file   Other Topics Concern  . Not on file   Social History Narrative   Regular exercise- no    Moved here from [place]- to be closer to family    Husband died of lung CA 2013/03/28    Past Surgical History  Procedure Laterality Date  . Gallbladder surgery  1997  . Rectocele repair  2003-03-29  . Total abdominal hysterectomy    . Cardiac catheterization  01/2008    65% ;[facility]. Minor luminal irregularities with no evidence of obstructive disease.  . Cataract surgery    . Breast biopsy Right 1988    Family History  Problem Relation Age of Onset  . Hypertension Mother   . Breast cancer Mother   . Cancer Sister     colon  . Cancer Daughter     thyroid  . Anxiety disorder Daughter   . Breast cancer Daughter   . Hypertension Father   . Aneurysm Father     AAA    Allergies  Allergen Reactions  . Albuterol     REACTION: jittery  . Alendronate Sodium     REACTION: GI side eff  . Amoxicillin-Pot Clavulanate     REACTION: non tolerant- but can take plain amox  .  Atorvastatin   . Azithromycin     GI upset.  Not an allergy.  . Doxycycline     diarrhea  . Ezetimibe-Simvastatin     REACTION: myalgia  . Gabapentin     REACTION: tremor  . Ibandronate Sodium     REACTION: GI side eff  . Lexapro [Escitalopram Oxalate]     sedation  . Risedronate Sodium     REACTION: GI side eff  . Rosuvastatin     REACTION: myalgia  . Sertraline     sedation  . Sulfonamide Derivatives     REACTION: non tolerate    Current Outpatient Prescriptions on File Prior to Visit  Medication Sig Dispense Refill  . Calcium Carbonate-Vitamin D (CALTRATE 600+D) 600-400 MG-UNIT per tablet Take 1 tablet by mouth two times a day    . FLOVENT DISKUS 250 MCG/BLIST AEPB INHALE ONE PUFF BY MOUTH TWICE DAILY *RINSE MOUTH AFTER USE* 60 each 5  . fluticasone (FLONASE) 50 MCG/ACT nasal spray USE TWO SPRAYS IN EACH NOSTRIL ONCE DAILY 16 g 5  . HYDROcodone-acetaminophen (NORCO) 7.5-325 MG tablet TAKE ONE TABLET BY MOUTH  EVERY 8 HOURS AS NEEDED FOR PAIN 270 tablet 0  . lansoprazole (PREVACID) 30 MG capsule TAKE ONE CAPSULE BY MOUTH ONCE DAILY 30 capsule 11  . loratadine (CLARITIN) 10 MG tablet Take 1 tablet (10 mg total) by mouth daily. 90 tablet 3  . LORazepam (ATIVAN) 1 MG tablet TAKE ONE TABLET BY MOUTH TWICE DAILY TO THREE TIMES DAILY FOR ANXIETY 90 tablet 2  . metoprolol succinate (TOPROL-XL) 25 MG 24 hr tablet Take 1 tablet (25 mg total) by mouth daily. Take an extra pill per day if needed for heart racing 60 tablet 12  . montelukast (SINGULAIR) 10 MG tablet TAKE ONE TABLET BY MOUTH AT BEDTIME 30 tablet 5  . albuterol (PROVENTIL HFA;VENTOLIN HFA) 108 (90 BASE) MCG/ACT inhaler Inhale 1 puff into the lungs every 6 (six) hours as needed for wheezing. Reported on 01/09/2016     No current facility-administered medications on file prior to visit.    BP 126/78 mmHg  Pulse 76  Temp(Src) 97.9 F (36.6 C) (Oral)  Ht 4\' 11"  (1.499 m)  Wt 136 lb 6.4 oz (61.871 kg)  BMI 27.53 kg/m2  SpO2 96%    Objective:   Physical Exam  Constitutional: She appears well-nourished.  Neck: Neck supple.  Cardiovascular:  No murmur heard. PVC's noted every 4-5 beats.  Pulmonary/Chest: Effort normal and breath sounds normal. She has no wheezes. She has no rales.  Skin: Skin is warm and dry.  Psychiatric: She has a normal mood and affect.          Assessment & Plan:  ECG: Sinus tachycardia, rate of 109, PVC's every 4-5 beats.  No ST elevation, depression.

## 2016-01-09 NOTE — Telephone Encounter (Signed)
[facility]  Patient Name: Jillian Oconnell  DOB: 1934/03/12    Initial Comment Caller states, irregular heart beat, she needs to get a referral to see a specialist    Nurse Assessment  Nurse: Wayne Sever, RN, Tillie Rung Date/Time (Eastern Time): 01/09/2016 12:01:45 PM  Confirm and document reason for call. If symptomatic, describe symptoms. You must click the next button to save text entered. ---Caller states she has had an irregular heart beat for years. She states she has had multiple test and nothing shows. She states she is aggravated. She states it's doing it again today and seems worse than usual, but has been like that for a month. She states she also has anxiety and is taking medication for that.  Has the patient traveled out of the country within the last 30 days? ---Not Applicable  Does the patient have any new or worsening symptoms? ---Yes  Will a triage be completed? ---Yes  Related visit to physician within the last 2 weeks? ---No  Does the PT have any chronic conditions? (i.e. diabetes, asthma, etc.) ---Yes  List chronic conditions. ---Anxiety, Stomach Issues, Irregular heart beat, asthma  Is this a behavioral health or substance abuse call? ---No     Guidelines    Guideline Title Affirmed Question Affirmed Notes  Heart Rate and Heartbeat Questions [1] Skipped or extra beat(s) AND [2] increases with exercise or exertion    Final Disposition User   See Physician within 4 Hours (or PCP triage) Wayne Sever, RN, Jola Schmidt MD was booked. She wanted appointment tomorrow, and told her due to heart issues she really need to be seen today. She agreed to see Dr Garnette Gunner at 2pm   Referrals  REFERRED TO PCP OFFICE   Disagree/Comply: Comply

## 2016-01-11 ENCOUNTER — Encounter: Payer: Self-pay | Admitting: *Deleted

## 2016-01-16 ENCOUNTER — Ambulatory Visit: Payer: Self-pay | Admitting: Internal Medicine

## 2016-02-06 ENCOUNTER — Encounter: Payer: Self-pay | Admitting: Cardiovascular Disease

## 2016-02-06 ENCOUNTER — Ambulatory Visit (INDEPENDENT_AMBULATORY_CARE_PROVIDER_SITE_OTHER): Payer: Commercial Managed Care - HMO | Admitting: Cardiovascular Disease

## 2016-02-06 VITALS — BP 138/70 | HR 112 | Ht 59.0 in | Wt 136.2 lb

## 2016-02-06 DIAGNOSIS — I499 Cardiac arrhythmia, unspecified: Secondary | ICD-10-CM

## 2016-02-06 DIAGNOSIS — I493 Ventricular premature depolarization: Secondary | ICD-10-CM | POA: Diagnosis not present

## 2016-02-06 DIAGNOSIS — I471 Supraventricular tachycardia: Secondary | ICD-10-CM | POA: Diagnosis not present

## 2016-02-06 MED ORDER — METOPROLOL SUCCINATE ER 25 MG PO TB24: 25.0000 mg | ORAL_TABLET | Freq: Every day | ORAL | Status: DC

## 2016-02-06 NOTE — Patient Instructions (Signed)
Medication Instructions:  Your physician has recommended you make the following change in your medication:  INCREASE metoprolol to 25mg  once daily   Labwork: none  Testing/Procedures: none  Follow-Up: Your physician wants you to follow-up in: six months with Dr. Fletcher Anon.  You will receive a reminder letter in the mail two months in advance. If you don't receive a letter, please call our office to schedule the follow-up appointment.   Any Other Special Instructions Will Be Listed Below (If Applicable).     If you need a refill on your cardiac medications before your next appointment, please call your pharmacy.

## 2016-02-06 NOTE — Assessment & Plan Note (Signed)
No evidence of recurrent episodes since 2015.

## 2016-02-06 NOTE — Assessment & Plan Note (Signed)
[name] current symptoms seem to be due to symptomatic PVCs which are obvious on her EKG. She has prolonged history of sinus tachycardia and premature beats and this is not a new diagnosis. Previous heart attack workup as outlined above was overall unremarkable. She reports improvement in her symptoms since last month. I did ask her to increase the dose of Toprol 25 mg once daily to see if she can tolerate this. She did not tolerate diltiazem very well in the past.  Follow-up with me in 6 months.

## 2016-02-06 NOTE — Progress Notes (Signed)
Primary care physician: Dr. Damita Dunnings  HPI  This is a 82 year old female who is here today for a followup visit regarding palpitations with known history of sinus tachycardia, possible paroxysmal supraventricular tachycardia and frequent PACs and PVCs. She was most recently seen by me in 2014/04/10. She had previous cardiac catheterization in February of 2009 which showed minor luminal irregularities with no evidence of obstructive disease. Ejection fraction was normal. Palpitations worsened in 04-10-14 after the death of her husband. She was treated with diltiazem but then switched to a small dose metoprolol. Most recent echocardiogram in 04-10-2013 showed normal LV systolic function, grade 1 diastolic dysfunction and no significant pulmonary hypertension. She was seen recently by Alma Friendly for increased palpitations. She was noted to have PVCs. She is currently taking 12.5 mg of Toprol daily.her labs were checked and were overall unremarkable including normal TSH. The patient reports improvement in symptoms without intervention. No chest pain. She has chronic mild exertional dyspnea. No syncope or presyncope.   Allergies  Allergen Reactions  . Albuterol     REACTION: jittery  . Alendronate Sodium     REACTION: GI side eff  . Amoxicillin-Pot Clavulanate     REACTION: non tolerant- but can take plain amox  . Atorvastatin   . Azithromycin     GI upset.  Not an allergy.  . Doxycycline     diarrhea  . Ezetimibe-Simvastatin     REACTION: myalgia  . Gabapentin     REACTION: tremor  . Ibandronate Sodium     REACTION: GI side eff  . Lexapro [Escitalopram Oxalate]     sedation  . Risedronate Sodium     REACTION: GI side eff  . Rosuvastatin     REACTION: myalgia  . Sertraline     sedation  . Sulfonamide Derivatives     REACTION: non tolerate     Current Outpatient Prescriptions on File Prior to Visit  Medication Sig Dispense Refill  . albuterol (PROVENTIL HFA;VENTOLIN HFA) 108 (90  BASE) MCG/ACT inhaler Inhale 1 puff into the lungs every 6 (six) hours as needed for wheezing. Reported on 01/09/2016    . Calcium Carbonate-Vitamin D (CALTRATE 600+D) 600-400 MG-UNIT per tablet Take 1 tablet by mouth two times a day    . FLOVENT DISKUS 250 MCG/BLIST AEPB INHALE ONE PUFF BY MOUTH TWICE DAILY *RINSE MOUTH AFTER USE* 60 each 5  . fluticasone (FLONASE) 50 MCG/ACT nasal spray USE TWO SPRAYS IN EACH NOSTRIL ONCE DAILY 16 g 5  . HYDROcodone-acetaminophen (NORCO) 7.5-325 MG tablet TAKE ONE TABLET BY MOUTH EVERY 8 HOURS AS NEEDED FOR PAIN 270 tablet 0  . lansoprazole (PREVACID) 30 MG capsule TAKE ONE CAPSULE BY MOUTH ONCE DAILY 30 capsule 11  . loratadine (CLARITIN) 10 MG tablet Take 1 tablet (10 mg total) by mouth daily. 90 tablet 3  . LORazepam (ATIVAN) 1 MG tablet TAKE ONE TABLET BY MOUTH TWICE DAILY TO THREE TIMES DAILY FOR ANXIETY 90 tablet 2  . metoprolol succinate (TOPROL-XL) 25 MG 24 hr tablet Take 1 tablet (25 mg total) by mouth daily. Take an extra pill per day if needed for heart racing (Patient taking differently: Take 12.5 mg by mouth daily. Take an extra pill per day if needed for heart racing) 60 tablet 12  . montelukast (SINGULAIR) 10 MG tablet TAKE ONE TABLET BY MOUTH AT BEDTIME 30 tablet 5   No current facility-administered medications on file prior to visit.     Past Medical History  Diagnosis Date  . Allergy   . Asthma   . GERD (gastroesophageal reflux disease)   . Depression   . Palpitations   . Hyperlipidemia   . Hypertension   . OA (osteoarthritis) of knee     injections  . Osteopenia     DXA 03/25/10  . Glaucoma   . 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 Shingles     post herpetic neuralgia (pain clinic in past)  . IBS (irritable bowel syndrome)   . Anxiety   . Paroxysmal supraventricular tachycardia ([place]) May of 2050     Past Surgical History  Procedure Laterality Date  . Gallbladder surgery  1997  . Rectocele  repair  03/26/03  . Total abdominal hysterectomy    . Cardiac catheterization  01/2008    65% ;[facility]. Minor luminal irregularities with no evidence of obstructive disease.  . Cataract surgery    . Breast biopsy Right 1988     Family History  Problem Relation Age of Onset  . Hypertension Mother   . Breast cancer Mother   . Cancer Sister     colon  . Cancer Daughter     thyroid  . Anxiety disorder Daughter   . Breast cancer Daughter   . Hypertension Father   . Aneurysm Father     AAA     Social History   Social History  . Marital Status: Married    Spouse Name: N/A  . Number of Children: N/A  . Years of Education: N/A   Occupational History  . Retired     Agricultural engineer   Social History Main Topics  . Smoking status: Never Smoker   . Smokeless tobacco: Never Used  . Alcohol Use: No  . Drug Use: No  . Sexual Activity: Not on file   Other Topics Concern  . Not on file   Social History Narrative   Regular exercise- no    Moved here from [place]- to be closer to family    Husband died of lung CA Mar 25, 2013     ROS A 10 point review of system was performed. Negative other than what is mentioned in history of present illness.  PHYSICAL EXAM   BP 138/70 mmHg  Pulse 112  Ht 4\' 11"  (1.499 m)  Wt 136 lb 4 oz (61.803 kg)  BMI 27.50 kg/m2 Constitutional: She is oriented to person, place, and time. She appears well-developed and well-nourished. No distress.  HENT: No nasal discharge.  Head: Normocephalic and atraumatic.  Eyes: Pupils are equal and round. Right eye exhibits no discharge. Left eye exhibits no discharge.  Neck: Normal range of motion. Neck supple. No JVD present. No thyromegaly present.  Cardiovascular: tachycardic with premature beats, regular rhythm, normal heart sounds. Exam reveals no gallop and no friction rub. No murmur heard.  Pulmonary/Chest: Effort normal and breath sounds normal. No stridor. No respiratory distress. She has no wheezes. She has no rales. She  exhibits no tenderness.  Abdominal: Soft. Bowel sounds are normal. She exhibits no distension. There is no tenderness. There is no rebound and no guarding.  Musculoskeletal: Normal range of motion. She exhibits no edema and no tenderness.  Neurological: She is alert and oriented to person, place, and time. Coordination normal.  Skin: Skin is warm and dry. No rash noted. She is not diaphoretic. No erythema. No pallor.  Psychiatric: The patient is tearful and anxious.. Judgment and thought  content normal.     [id number]  Tachycardia with frequent PVCs  ABNORMAL      ASSESSMENT AND PLAN

## 2016-02-20 DIAGNOSIS — H353131 Nonexudative age-related macular degeneration, bilateral, early dry stage: Secondary | ICD-10-CM | POA: Diagnosis not present

## 2016-02-21 ENCOUNTER — Ambulatory Visit (INDEPENDENT_AMBULATORY_CARE_PROVIDER_SITE_OTHER): Payer: Commercial Managed Care - HMO | Admitting: Family Medicine

## 2016-02-21 ENCOUNTER — Encounter: Payer: Self-pay | Admitting: Family Medicine

## 2016-02-21 VITALS — BP 110/68 | HR 55 | Temp 98.4°F | Ht 59.0 in | Wt 134.5 lb

## 2016-02-21 DIAGNOSIS — E2839 Other primary ovarian failure: Secondary | ICD-10-CM | POA: Diagnosis not present

## 2016-02-21 DIAGNOSIS — Z1211 Encounter for screening for malignant neoplasm of colon: Secondary | ICD-10-CM | POA: Diagnosis not present

## 2016-02-21 DIAGNOSIS — B0229 Other postherpetic nervous system involvement: Secondary | ICD-10-CM

## 2016-02-21 DIAGNOSIS — F4321 Adjustment disorder with depressed mood: Secondary | ICD-10-CM | POA: Diagnosis not present

## 2016-02-21 DIAGNOSIS — J0100 Acute maxillary sinusitis, unspecified: Secondary | ICD-10-CM

## 2016-02-21 DIAGNOSIS — M8580 Other specified disorders of bone density and structure, unspecified site: Secondary | ICD-10-CM

## 2016-02-21 MED ORDER — HYDROCODONE-ACETAMINOPHEN 7.5-325 MG PO TABS: ORAL_TABLET | ORAL | Status: DC

## 2016-02-21 MED ORDER — ZOSTER VACCINE LIVE 19400 UNT/0.65ML ~~LOC~~ SOLR: 0.6500 mL | Freq: Once | SUBCUTANEOUS | Status: DC

## 2016-02-21 MED ORDER — AMOXICILLIN 875 MG PO TABS: 875.0000 mg | ORAL_TABLET | Freq: Two times a day (BID) | ORAL | Status: AC

## 2016-02-21 NOTE — Progress Notes (Signed)
Pre visit review using our clinic review tool, if applicable. No additional management support is needed unless otherwise documented below in the visit note.  D/w patient [id number] for colon cancer screening, including IFOB vs. colonoscopy.  Risks and benefits of both were discussed and patient voiced understanding.  Pt elects for to defer given her age and last colonoscopy being normal. FH noted but given her age she make a reasonable point about skipping screening for now, given the possible risk of the procedure.    Mammogram d/w pt.  She'll call about that later on.    Shingles shot d/w pt.  rx given to patient.    DXA d/w pt.  She would consider treatment if needed.  Reasonable to set up DXA.  Ordered.    More sinus drainage and some ST.  Worse in the change of seasons.  Sick contacts.  No SABA use.  On singulair and other inhaler but less use of flonase recently.  Maxillary pain, frontal pain, worse leaning over.    Mood d/w pt.  Still on BZD prn. No ADE.  Still working through the grief of her husband's death.  She doesn't have palpitations as much when using that med.    Shingles pain continues on L side.  No ADE on vicodin.  That helped the pain better than anything else.  She is self weaning but spacing the dosing out, our using a half a pill.    PMH and SH reviewed  ROS: See HPI, otherwise noncontributory.  Meds, vitals, and allergies reviewed.   GEN: nad, alert and oriented HEENT: mucous membranes moist, tm w/o erythema, nasal exam w/o erythema, clear discharge noted,  OP with cobblestoning, max and sinus pain ttp B.   NECK: supple w/o LA CV: rrr.   PULM: ctab, no inc wob EXT: no edema SKIN: no acute rash

## 2016-02-21 NOTE — Assessment & Plan Note (Addendum)
Continue to slowly wean off opiates.  rx done at [place]. No ADE on med. Shingles shot d/w pt. Rx given to patient.

## 2016-02-21 NOTE — Assessment & Plan Note (Signed)
Recheck DXA ordered, pending.

## 2016-02-21 NOTE — Assessment & Plan Note (Signed)
Defer for now. This is reasonable given her age.

## 2016-02-21 NOTE — Assessment & Plan Note (Signed)
Nontoxic, she'll restart flonase and then start amoxil if not better.  rx given to patient.

## 2016-02-21 NOTE — Assessment & Plan Note (Signed)
I would continue with PRN BZD.  No ADE.  She is working through her grief slowly.

## 2016-02-21 NOTE — Patient Instructions (Addendum)
Jillian Oconnell will call about your referral. Call about a mammogram later this year.  Check with your insurance to see if they will cover the shingles shot. Get back on the flonase and if not better soon then start the amoxil.  Take care.  Glad to see you.

## 2016-03-07 ENCOUNTER — Ambulatory Visit: Payer: Commercial Managed Care - HMO

## 2016-03-19 ENCOUNTER — Ambulatory Visit
Admission: RE | Admit: 2016-03-19 | Discharge: 2016-03-19 | Disposition: A | Discharge status: Home / Self Care | Payer: Commercial Managed Care - HMO | Source: Ambulatory Visit | Attending: Family Medicine | Admitting: Family Medicine

## 2016-03-19 DIAGNOSIS — E2839 Other primary ovarian failure: Secondary | ICD-10-CM | POA: Diagnosis not present

## 2016-03-19 DIAGNOSIS — M810 Age-related osteoporosis without current pathological fracture: Secondary | ICD-10-CM | POA: Insufficient documentation

## 2016-03-19 DIAGNOSIS — Z780 Asymptomatic menopausal state: Secondary | ICD-10-CM | POA: Diagnosis not present

## 2016-03-21 ENCOUNTER — Encounter: Payer: Self-pay | Admitting: Family Medicine

## 2016-03-21 ENCOUNTER — Other Ambulatory Visit: Payer: Self-pay | Admitting: Family Medicine

## 2016-03-21 DIAGNOSIS — M810 Age-related osteoporosis without current pathological fracture: Secondary | ICD-10-CM

## 2016-03-26 ENCOUNTER — Encounter: Payer: Self-pay | Admitting: Family Medicine

## 2016-03-26 ENCOUNTER — Ambulatory Visit (INDEPENDENT_AMBULATORY_CARE_PROVIDER_SITE_OTHER): Payer: Commercial Managed Care - HMO | Admitting: Family Medicine

## 2016-03-26 VITALS — BP 110/70 | HR 102 | Temp 97.7°F | Wt 135.5 lb

## 2016-03-26 DIAGNOSIS — M545 Low back pain, unspecified: Secondary | ICD-10-CM

## 2016-03-26 DIAGNOSIS — M810 Age-related osteoporosis without current pathological fracture: Secondary | ICD-10-CM

## 2016-03-26 NOTE — Progress Notes (Signed)
Pre visit review using our clinic review tool, if applicable. No additional management support is needed unless otherwise documented below in the visit note.  Recent acute diarrhea for 2 days, resolved now.  She got better in the meantime, except for likely lactose intolerance after eating some pizza.  This may have been a temporary issue after the recent AGE- she is better with cutting out dairy recently.    DXA d/w pt.   See plan.    R thigh pain, after DXA testing.  No falls.  No other trauma.  No weakness.   Meds, vitals, and allergies reviewed.   ROS: See HPI.  Otherwise, noncontributory.  nad ncat rrr ctab abd soft Back not ttp in midline, greater troch not ttp R lower back ttp in the paraspinal area, lateral L spine on R side SLR neg, s/s wnl BLE

## 2016-03-26 NOTE — Patient Instructions (Addendum)
Go to the lab on the way out.  We'll contact you with your lab report. Consider prolia (twice yearly injection) in the meantime, after we get your vitamin D results.  It may overall decrease your risk of a fracture, with the understanding that some rare fractures can be more common on the medicine.  Keep using heat and ice on your back hip and leg.  It should gradually get better.  I think you pulled a muscle.  Take care.  Glad to see you.

## 2016-03-27 LAB — VITAMIN D 25 HYDROXY (VIT D DEFICIENCY, FRACTURES): VITD: 68.66 ng/mL (ref 30.00–100.00)

## 2016-03-28 DIAGNOSIS — M545 Low back pain, unspecified: Secondary | ICD-10-CM | POA: Insufficient documentation

## 2016-03-28 NOTE — Assessment & Plan Note (Signed)
Would use heat and ice on the back hip and leg. It should gradually get better. I think she pulled a muscle in the meantime.

## 2016-03-28 NOTE — Assessment & Plan Note (Signed)
Didn't tolerate bisphosphonates prev.  D/w pt.  Would be reasonable to check vit D and then consider prolia.   D/w pt about cautions, risk/benefits, esp dental issues (shouldn't be a concern with full dentures and no plan for dental work) and atypical long bone fx.  She'll consider.  I didn't give a strong opinion one way or the other for treatment or not, since I want her to have the chance to consider options re: tx.  If she opts for treatment in general, then prolia would likely be the best option.  >25 minutes spent in face to face time with patient, >50% spent in counselling or coordination of care.

## 2016-03-31 ENCOUNTER — Other Ambulatory Visit: Payer: Self-pay | Admitting: Family Medicine

## 2016-04-02 ENCOUNTER — Other Ambulatory Visit: Payer: Self-pay

## 2016-04-02 ENCOUNTER — Other Ambulatory Visit: Payer: Self-pay | Admitting: *Deleted

## 2016-04-02 NOTE — Telephone Encounter (Signed)
Pt left v/m requesting status of lorazepam request to [facility] [address]. Last refilled # 90 x 2 on 06/01/15. Last annual exam on 02/21/16. Pt is out of med and request cb.

## 2016-04-02 NOTE — Telephone Encounter (Signed)
error 

## 2016-04-02 NOTE — Telephone Encounter (Signed)
Erroneous encounter

## 2016-04-02 NOTE — Telephone Encounter (Signed)
Electronic refill request. Last Filled:    90 tablet 2 06/01/2015  Last office visit:   03/26/16.  Please advise.

## 2016-04-03 NOTE — Telephone Encounter (Signed)
Medication phoned to pharmacy.  

## 2016-04-03 NOTE — Telephone Encounter (Signed)
Please call in.  Thanks.   

## 2016-04-05 ENCOUNTER — Other Ambulatory Visit: Payer: Self-pay | Admitting: Family Medicine

## 2016-04-05 DIAGNOSIS — Z1231 Encounter for screening mammogram for malignant neoplasm of breast: Secondary | ICD-10-CM

## 2016-04-16 ENCOUNTER — Ambulatory Visit (INDEPENDENT_AMBULATORY_CARE_PROVIDER_SITE_OTHER)
Admission: RE | Admit: 2016-04-16 | Discharge: 2016-04-16 | Disposition: A | Discharge status: Home / Self Care | Payer: Commercial Managed Care - HMO | Source: Ambulatory Visit | Attending: Family Medicine | Admitting: Family Medicine

## 2016-04-16 ENCOUNTER — Encounter: Payer: Self-pay | Admitting: Family Medicine

## 2016-04-16 ENCOUNTER — Ambulatory Visit (INDEPENDENT_AMBULATORY_CARE_PROVIDER_SITE_OTHER): Payer: Commercial Managed Care - HMO | Admitting: Family Medicine

## 2016-04-16 VITALS — BP 144/80 | HR 86 | Temp 98.3°F | Wt 134.0 lb

## 2016-04-16 DIAGNOSIS — M25551 Pain in right hip: Secondary | ICD-10-CM | POA: Diagnosis not present

## 2016-04-16 DIAGNOSIS — M545 Low back pain, unspecified: Secondary | ICD-10-CM

## 2016-04-16 MED ORDER — PREDNISONE 10 MG PO TABS: ORAL_TABLET | ORAL | Status: DC

## 2016-04-16 NOTE — Patient Instructions (Signed)
Go to the lab on the way out.  We'll contact you with your xray report. Let me see the pictures and the we'll see about sending in a short course of steroids for the pain, since you can tolerate that better than ibuprofen.  Take care.  Glad to see you.

## 2016-04-16 NOTE — Assessment & Plan Note (Signed)
With SLR neg, with likely R greater troch bursitis, d/w pt. Check imaging and then notify pt.  See note on xrays.  She can't tolerate nsaids, but can take prednisone.

## 2016-04-16 NOTE — Progress Notes (Signed)
Pre visit review using our clinic review tool, if applicable. No additional management support is needed unless otherwise documented below in the visit note.  Still with R hip pain, since the time of the DXA.  No falls.  No other trauma.  No L sided pain.  Pain with walking, can't make it through a trip to the grocery store w/o a lot of pain.  Not much help with baseline meds.  No weakness.  No FCNAVD.  No dysuria.    She has R knee OA at baseline.   Defer osteoporosis tx for now, since she is having the pain.  Vit D wnl, dw pt.    Meds, vitals, and allergies reviewed.   ROS: Per HPI unless specifically indicated in ROS section   nad ncat rrr ctab abd soft Back w/o midline pain but R lower back ttp.  R SLR neg R greater troch ttp Minimal pain with hip ROM o/w Distally s/s wnl BLE to gross testing.

## 2016-04-30 DIAGNOSIS — L570 Actinic keratosis: Secondary | ICD-10-CM | POA: Diagnosis not present

## 2016-04-30 DIAGNOSIS — L814 Other melanin hyperpigmentation: Secondary | ICD-10-CM | POA: Diagnosis not present

## 2016-04-30 DIAGNOSIS — L821 Other seborrheic keratosis: Secondary | ICD-10-CM | POA: Diagnosis not present

## 2016-04-30 DIAGNOSIS — L578 Other skin changes due to chronic exposure to nonionizing radiation: Secondary | ICD-10-CM | POA: Diagnosis not present

## 2016-04-30 DIAGNOSIS — Z85828 Personal history of other malignant neoplasm of skin: Secondary | ICD-10-CM | POA: Diagnosis not present

## 2016-05-10 ENCOUNTER — Other Ambulatory Visit: Payer: Self-pay | Admitting: Family Medicine

## 2016-05-10 ENCOUNTER — Ambulatory Visit
Admission: RE | Admit: 2016-05-10 | Discharge: 2016-05-10 | Disposition: A | Discharge status: Home / Self Care | Payer: Commercial Managed Care - HMO | Source: Ambulatory Visit | Attending: Family Medicine | Admitting: Family Medicine

## 2016-05-10 DIAGNOSIS — Z1231 Encounter for screening mammogram for malignant neoplasm of breast: Secondary | ICD-10-CM | POA: Diagnosis not present

## 2016-06-14 ENCOUNTER — Other Ambulatory Visit: Payer: Self-pay | Admitting: Family Medicine

## 2016-06-28 ENCOUNTER — Telehealth: Payer: Self-pay | Admitting: Family Medicine

## 2016-06-28 ENCOUNTER — Encounter: Payer: Self-pay | Admitting: Family Medicine

## 2016-06-28 ENCOUNTER — Ambulatory Visit (INDEPENDENT_AMBULATORY_CARE_PROVIDER_SITE_OTHER): Payer: Commercial Managed Care - HMO | Admitting: Family Medicine

## 2016-06-28 VITALS — BP 122/82 | HR 95 | Temp 98.5°F | Wt 133.2 lb

## 2016-06-28 DIAGNOSIS — B0229 Other postherpetic nervous system involvement: Secondary | ICD-10-CM | POA: Diagnosis not present

## 2016-06-28 DIAGNOSIS — J0100 Acute maxillary sinusitis, unspecified: Secondary | ICD-10-CM

## 2016-06-28 DIAGNOSIS — M810 Age-related osteoporosis without current pathological fracture: Secondary | ICD-10-CM | POA: Diagnosis not present

## 2016-06-28 DIAGNOSIS — L989 Disorder of the skin and subcutaneous tissue, unspecified: Secondary | ICD-10-CM

## 2016-06-28 MED ORDER — FLUTICASONE PROPIONATE (INHAL) 250 MCG/BLIST IN AEPB: INHALATION_SPRAY | RESPIRATORY_TRACT | Status: DC

## 2016-06-28 MED ORDER — FLUTICASONE PROPIONATE 50 MCG/ACT NA SUSP: NASAL | Status: DC

## 2016-06-28 MED ORDER — METOPROLOL SUCCINATE ER 25 MG PO TB24: 25.0000 mg | ORAL_TABLET | Freq: Every day | ORAL | Status: DC

## 2016-06-28 MED ORDER — LORAZEPAM 1 MG PO TABS: ORAL_TABLET | ORAL | Status: DC

## 2016-06-28 MED ORDER — HYDROCODONE-ACETAMINOPHEN 7.5-325 MG PO TABS: ORAL_TABLET | ORAL | Status: DC

## 2016-06-28 MED ORDER — AMOXICILLIN 875 MG PO TABS
875.0000 mg | ORAL_TABLET | Freq: Two times a day (BID) | ORAL | Status: DC
Start: 2016-06-28 — End: 2016-07-11

## 2016-06-28 NOTE — Telephone Encounter (Signed)
Pt has appt with Dr Damita Dunnings 06/28/16 at 11:30am.

## 2016-06-28 NOTE — Patient Instructions (Addendum)
Think about osteoporosis treatment with prolia (the injection we talked about) and let me know if you want to proceed.   Rosaria Ferries will call about your referral.  See her on the way out.   Start amoxil in the meantime.  Take care.  Glad to see you.

## 2016-06-28 NOTE — Telephone Encounter (Signed)
[facility] Patient Name: Jillian Oconnell DOB: 1934/10/22 Initial Comment BP 163/87, headache, when bending over her cheek bones and under her eyes hurt Nurse Assessment Nurse: Dimas Chyle, RN, Dellis Filbert Date/Time (Eastern Time): 06/28/2016 9:17:03 AM Confirm and document reason for call. If symptomatic, describe symptoms. You must click the next button to save text entered. ---BP 163/87, headache, when bending over her cheek bones and under her eyes hurt. Having nasal congestion. Started yesterday. No fever. Has the patient traveled out of the country within the last 30 days? ---No Does the patient have any new or worsening symptoms? ---Yes Will a triage be completed? ---Yes Related visit to physician within the last 2 weeks? ---N/A Does the PT have any chronic conditions? (i.e. diabetes, asthma, etc.) ---Yes List chronic conditions. ---Asthma Is this a behavioral health or substance abuse call? ---No Guidelines Guideline Title Affirmed Question Affirmed Notes Sinus Pain or Congestion [1] Redness or swelling on the cheek, forehead or around the eye AND [2] no fever Final Disposition User See Physician within 4 Hours (or PCP triage) Dimas Chyle, RN, Dellis Filbert Referrals REFERRED TO PCP OFFICE Disagree/Comply: Leta Baptist

## 2016-06-28 NOTE — Progress Notes (Signed)
Pre visit review using our clinic review tool, if applicable. No additional management support is needed unless otherwise documented below in the visit note.  duration of symptoms: a few days Rhinorrhea: yes Congestion: yes ear pain: no sore throat: no Cough: yes, some.   Maxillary pain noted by patient.   other concerns: breathing improved overall with ICS and singulair.    She had a rash and irritation on her groin.  Going on for about 3-4 months.  Feels a lot of skin tags per patient report.  She wanted to see Dr. Hulan Fray again and didn't want me to check her.  D/w pt.    D/w pt about osteoporosis and prev tx options.  D/w pt about possible tx with prolia.  Vit D prev up to normal.      She still has some shingles pain and has tried to taper her norco use, as tolerated.  Last rx for 3 months was done in early 02/2016.  rx done at [place].  No ADE on med.     Per HPI unless specifically indicated in ROS section   Meds, vitals, and allergies reviewed.   GEN: nad, alert and oriented HEENT: mucous membranes moist, TM w/o erythema, nasal epithelium injected, OP with cobblestoning, max and fronatal sinuses ttp B NECK: supple w/o LA CV: rrr. PULM: ctab, no inc wob ABD: soft, +bs EXT: no edema

## 2016-06-29 DIAGNOSIS — L989 Disorder of the skin and subcutaneous tissue, unspecified: Secondary | ICD-10-CM | POA: Insufficient documentation

## 2016-06-29 DIAGNOSIS — H35371 Puckering of macula, right eye: Secondary | ICD-10-CM | POA: Diagnosis not present

## 2016-06-29 NOTE — Assessment & Plan Note (Signed)
Amoxil, supportive care o/w.  Nontoxic.  >25 minutes spent in face to face time with patient, >50% spent in counselling or coordination of care.

## 2016-06-29 NOTE — Assessment & Plan Note (Signed)
D/w pt about osteoporosis and prev tx options.  D/w pt about possible tx with prolia.  Vit D prev up to normal.    She'll consider and update me.

## 2016-06-29 NOTE — Assessment & Plan Note (Signed)
She still has some shingles pain and has tried to taper her norco use, as tolerated.  Last rx for 3 months was done in early 02/2016.  rx done at [place].  No ADE on med.  Continue as is.  rx given to patient.

## 2016-06-29 NOTE — Assessment & Plan Note (Signed)
Refer to gyn per patient request.

## 2016-07-11 ENCOUNTER — Encounter: Payer: Self-pay | Admitting: Family Medicine

## 2016-07-11 ENCOUNTER — Ambulatory Visit (INDEPENDENT_AMBULATORY_CARE_PROVIDER_SITE_OTHER): Payer: Commercial Managed Care - HMO | Admitting: Family Medicine

## 2016-07-11 ENCOUNTER — Telehealth: Payer: Self-pay | Admitting: Family Medicine

## 2016-07-11 DIAGNOSIS — J0100 Acute maxillary sinusitis, unspecified: Secondary | ICD-10-CM

## 2016-07-11 MED ORDER — DOXYCYCLINE HYCLATE 100 MG PO TABS: 100.0000 mg | ORAL_TABLET | Freq: Two times a day (BID) | ORAL | 0 refills | Status: DC

## 2016-07-11 NOTE — Telephone Encounter (Signed)
Jillian Oconnell spoke to patient and she agreed to come in for appointment today at 4:45. Slot opened by [facility] and appointment was scheduled.

## 2016-07-11 NOTE — Patient Instructions (Signed)
Start doxy and take it with align.  Careful for sun exposure. Take care.  Glad to see you.  Update me as needed.

## 2016-07-11 NOTE — Progress Notes (Signed)
Pre visit review using our clinic review tool, if applicable. No additional management support is needed unless otherwise documented below in the visit note.  She has had HA and neck pain, unclear if positional from sleeping sitting up.   BP was up last night, unclear if from pain.  She took an extra metoprolol and BP improved last night.  More facial pain if leaning over.  Still on baseline flonase/flovent/singulair.   No cough.  A lot of post nasal gtt.  Slightly hoarse recently.  Occ UAN vs wheeze.  This episode of URI sx has been going on for about 6 days.   Meds, vitals, and allergies reviewed.   ROS: Per HPI unless specifically indicated in ROS section   GEN: nad, alert and oriented HEENT: mucous membranes moist, tm w/o erythema, nasal exam w/o erythema, clear discharge noted,  OP with cobblestoning NECK: supple w/o LA CV: rrr.   occ ectopy noted PULM: ctab, no inc wob EXT: no edema SKIN: no acute rash Sinuses ttp x4

## 2016-07-11 NOTE — Telephone Encounter (Signed)
Patient Name: Jillian Oconnell  DOB: March 25, 1934    Initial Comment Caller states has had sinus infection for a couple weeks, meds not helping, bad headache, bp 137/83    Nurse Assessment  Nurse: Raphael Gibney, RN, Vera Date/Time (Eastern Time): 07/11/2016 8:36:09 AM  Confirm and document reason for call. If symptomatic, describe symptoms. You must click the next button to save text entered. ---Caller states she was diagnosed with sinus infection 2 weeks ago. She has finished her anitibiotics. She completed amoxicillin. has sinus headache on the right side. Has a lot of sinus drainage. No cough. No fever. BP 137/83 this am. BP 190/102 last night.  Has the patient traveled out of the country within the last 30 days? ---Not Applicable  Does the patient have any new or worsening symptoms? ---Yes  Will a triage be completed? ---Yes  Related visit to physician within the last 2 weeks? ---No  Does the PT have any chronic conditions? (i.e. diabetes, asthma, etc.) ---Yes  List chronic conditions. ---tachycardia; asthma  Is this a behavioral health or substance abuse call? ---No     Guidelines    Guideline Title Affirmed Question Affirmed Notes  Sinus Pain or Congestion Earache    Final Disposition User   See Physician within 26 Hours [place], RN, Vanita Ingles    Comments  Pt states she already has appt with Dr. Damita Dunnings at 2 pm on 07/12/16 and does not want to see another doctor today. She would like to be notified if Dr. Damita Dunnings has a cancellation today.   Referrals  REFERRED TO PCP OFFICE   Disagree/Comply: Disagree  Disagree/Comply Reason: Disagree with instructions

## 2016-07-11 NOTE — Telephone Encounter (Signed)
Okay to open the 4:45 for her.  Please don't open that slot until she confirms she can come in then.  Thanks.

## 2016-07-11 NOTE — Telephone Encounter (Signed)
Noted. Thanks.

## 2016-07-11 NOTE — Telephone Encounter (Signed)
Pt has appt with Dr Damita Dunnings on 07/12/16 at 2 PM. Cancellation request given to front desk if appt becomes available.

## 2016-07-12 ENCOUNTER — Ambulatory Visit: Payer: Commercial Managed Care - HMO | Admitting: Family Medicine

## 2016-07-12 NOTE — Assessment & Plan Note (Signed)
D/w pt about options.  No allergy but GI upset with doxy prev.  Reasonable to try doxy with align probiotic and update me as needed.  This seems to be the best option at this point.  She agrees.  Nontoxic o/w.  She'll update me as needed.

## 2016-07-19 ENCOUNTER — Encounter: Payer: Self-pay | Admitting: Obstetrics & Gynecology

## 2016-07-19 ENCOUNTER — Encounter: Payer: Self-pay | Admitting: *Deleted

## 2016-07-19 ENCOUNTER — Ambulatory Visit (INDEPENDENT_AMBULATORY_CARE_PROVIDER_SITE_OTHER): Payer: Commercial Managed Care - HMO | Admitting: Obstetrics & Gynecology

## 2016-07-19 VITALS — BP 148/88 | HR 88 | Resp 18 | Wt 135.0 lb

## 2016-07-19 DIAGNOSIS — N9089 Other specified noninflammatory disorders of vulva and perineum: Secondary | ICD-10-CM

## 2016-07-19 DIAGNOSIS — N763 Subacute and chronic vulvitis: Secondary | ICD-10-CM | POA: Diagnosis not present

## 2016-07-19 NOTE — Progress Notes (Signed)
   Subjective:    Patient ID: Jillian Oconnell, female    DOB: 03-30-1934, 82 y.o.   MRN: [id number]  HPI 82 yo lady here because she noted some "bumps" on her labia majora. They are not bothering her in any way.   Review of Systems She is not sexually active.    Objective:   Physical Exam [id number] Breathing, conversing, and ambulating normally She has some milia- type bumps on both of her labia majora She has a white 1 cm lesion just inside of her introitus on the right. I biopsied this area without incident.       Assessment & Plan:  Right vulvar lesion- await pathology

## 2016-07-19 NOTE — Progress Notes (Signed)
Pt here today c/o vaginal bumps that occasionally get irritated and also c/o urinary frequency.

## 2016-07-25 ENCOUNTER — Telehealth: Payer: Self-pay | Admitting: *Deleted

## 2016-07-25 NOTE — Telephone Encounter (Signed)
Informed pt of negative biopsy result but that inflammation was noted.  Informed her that Dr Hulan Fray would be in the office in the morning and she would review result and if she has any additional recommendations that we would contact her at that time.

## 2016-07-25 NOTE — Telephone Encounter (Signed)
-----   Message from Francia Greaves sent at 07/24/2016  1:52 PM EDT ----- Regarding: Results Called and left a message on office voicemail, wants biopsy results

## 2016-08-22 ENCOUNTER — Encounter: Payer: Self-pay | Admitting: Family Medicine

## 2016-08-22 ENCOUNTER — Ambulatory Visit (INDEPENDENT_AMBULATORY_CARE_PROVIDER_SITE_OTHER): Payer: Commercial Managed Care - HMO | Admitting: Family Medicine

## 2016-08-22 VITALS — BP 160/60 | HR 100 | Temp 97.6°F | Wt 133.5 lb

## 2016-08-22 DIAGNOSIS — J0100 Acute maxillary sinusitis, unspecified: Secondary | ICD-10-CM

## 2016-08-22 DIAGNOSIS — F329 Major depressive disorder, single episode, unspecified: Secondary | ICD-10-CM | POA: Diagnosis not present

## 2016-08-22 DIAGNOSIS — F32A Depression, unspecified: Secondary | ICD-10-CM

## 2016-08-22 MED ORDER — DOXYCYCLINE HYCLATE 100 MG PO TABS: 100.0000 mg | ORAL_TABLET | Freq: Two times a day (BID) | ORAL | 0 refills | Status: DC

## 2016-08-22 MED ORDER — MIRTAZAPINE 15 MG PO TABS: 15.0000 mg | ORAL_TABLET | Freq: Every day | ORAL | 1 refills | Status: DC

## 2016-08-22 NOTE — Progress Notes (Signed)
duration of symptoms: a few weeks.   rhinorrhea:yes congestion:yes ear pain: itchy but not painful sore throat:yes cough:yes Myalgias:at baseline. She never really fully resolved from prev OV/illness.   She had tried probiotics with last round of abx.  She has h/o some diarrhea with food changes but likely not from abx.   She has historically done better with combination of flovent and flonase.  She rarely uses SABA.  She was travelling to [place] and spending time with her daughter, electric cooling, wood stove in winter.  Unclear how often they had changed their air filters.  D/w pt.   Her mood is lower.  She recently had a the anniversary of her husband's death.  She is still lonesome at baseline.  She feels more withdrawn.  She has tried SSRIs in the past, w/o tolerating the meds.  She lives with 1 daughter, and that helps, but "it isn't the same as it used to be."    Per HPI unless specifically indicated in ROS section   Meds, vitals, and allergies reviewed.   GEN: nad, alert and oriented HEENT: mucous membranes moist, TM w/o erythema, nasal epithelium injected, OP with cobblestoning, sinuses ttp x4 NECK: supple w/o LA CV: rrr. PULM: ctab, no inc wob ABD: soft, +bs EXT: no edema Affect downcast, tearful but regains composure.

## 2016-08-22 NOTE — Progress Notes (Signed)
Pre visit review using our clinic review tool, if applicable. No additional management support is needed unless otherwise documented below in the visit note. 

## 2016-08-22 NOTE — Patient Instructions (Signed)
Restart doxy, add on mirtazapine at night and update me as needed.  Take care.  Glad to see you.

## 2016-08-23 NOTE — Assessment & Plan Note (Signed)
Nontoxic, restart doxycycline. Routine cautions given. Likely with diarrhea previously noted was from food changes and not from antibiotics. She'll update me as needed. Continue baseline meds.

## 2016-08-23 NOTE — Assessment & Plan Note (Signed)
>  25 minutes spent in face to face time with patient, >50% spent in counselling or coordination of care Discussed with patient. Still with some difficulty sleeping at night, using benzodiazepine at night. Mood is lower. Occasionally tearful. Still okay for outpatient follow-up. Failed treatment with multiple SSRIs previously. Start Remeron 15 mg. It may help her appetite. Sedation caution. Update me as needed. She agrees.

## 2016-09-03 ENCOUNTER — Ambulatory Visit (INDEPENDENT_AMBULATORY_CARE_PROVIDER_SITE_OTHER): Payer: Commercial Managed Care - HMO | Admitting: Cardiovascular Disease

## 2016-09-03 ENCOUNTER — Encounter: Payer: Self-pay | Admitting: Cardiovascular Disease

## 2016-09-03 VITALS — BP 152/78 | HR 100 | Ht 59.0 in | Wt 133.5 lb

## 2016-09-03 DIAGNOSIS — I471 Supraventricular tachycardia: Secondary | ICD-10-CM

## 2016-09-03 DIAGNOSIS — I493 Ventricular premature depolarization: Secondary | ICD-10-CM

## 2016-09-03 NOTE — Progress Notes (Signed)
Cardiology Office Note   Date:  09/03/2016   ID:  Jillian Oconnell, DOB 06-10-1934, MRN [id number]  PCP:  Elsie Stain, MD  Cardiologist:   Kathlyn Sacramento, MD   Chief Complaint  Patient presents with  . other    6 month follow up. Meds reviewed by the pt. verbally. "doing well."       History of Present Illness: Jillian Oconnell is a 82 y.o. female who presents for a followup visit regarding palpitations with known history of sinus tachycardia, possible paroxysmal supraventricular tachycardia and frequent  PVCs.  She had previous cardiac catheterization in February of 2009 which showed minor luminal irregularities with no evidence of obstructive disease. Ejection fraction was normal. Palpitations worsened in 30-Mar-2014 after the death of her husband. She was treated with diltiazem but then switched to a small dose metoprolol. Most recent echocardiogram in 2013/03/30 showed normal LV systolic function, grade 1 diastolic dysfunction and no significant pulmonary hypertension. During last visit, I increased the dose of Toprol 25 mg once daily. She reports continued palpitations but her symptoms are overall mild. She has mild exertional dyspnea with no orthopnea, PND or chest pain. No dizziness, syncope or presyncope. She reports significant fluctuation in blood pressure.   Past Medical History:  Diagnosis Date  . Allergy   . Anxiety   . Asthma   . Depression   . GERD (gastroesophageal reflux disease)   . Glaucoma   . Hyperlipidemia   . Hypertension   . IBS (irritable bowel syndrome)   . OA (osteoarthritis) of knee    injections  . Osteopenia    DXA 2010-03-30  . Palpitations   . Paroxysmal supraventricular tachycardia ([place]) May of 2005  . 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 Shingles    post herpetic neuralgia (pain clinic in past)    Past Surgical History:  Procedure Laterality Date  . BREAST EXCISIONAL BIOPSY Right 1988   NEG  . CARDIAC CATHETERIZATION   01/2008   65% ;[place]. Minor luminal irregularities with no evidence of obstructive disease.  . cataract surgery    . [place]  . RECTOCELE REPAIR  2003/03/31  . TOTAL ABDOMINAL HYSTERECTOMY       Current Outpatient Prescriptions  Medication Sig Dispense Refill  . albuterol (PROVENTIL HFA;VENTOLIN HFA) 108 (90 BASE) MCG/ACT inhaler Inhale 1 puff into the lungs every 6 (six) hours as needed for wheezing. Reported on 02/21/2016    . Calcium Carbonate-Vitamin D (CALTRATE 600+D) 600-400 MG-UNIT per tablet Take 1 tablet by mouth two times a day    . fluticasone (FLONASE) 50 MCG/ACT nasal spray USE TWO SPRAYS IN EACH NOSTRIL ONCE DAILY 16 g 12  . Fluticasone Propionate, Inhal, (FLOVENT DISKUS) 250 MCG/BLIST AEPB INHALE ONE PUFF BY MOUTH TWICE DAILY *RINSE MOUTH AFTER USE* 60 each 12  . HYDROcodone-acetaminophen (NORCO) 7.5-325 MG tablet TAKE ONE TABLET BY MOUTH EVERY 8 HOURS AS NEEDED FOR PAIN 270 tablet 0  . lansoprazole (PREVACID) 30 MG capsule TAKE ONE CAPSULE BY MOUTH ONCE DAILY 30 capsule 11  . loratadine (CLARITIN) 10 MG tablet Take 1 tablet (10 mg total) by mouth daily. 90 tablet 3  . LORazepam (ATIVAN) 1 MG tablet TAKE ONE TABLET BY MOUTH TWICE DAILY TO THREE TIMES DAILY FOR ANXIETY 90 tablet 2  . metoprolol succinate (TOPROL-XL) 25 MG 24 hr tablet Take 1 tablet (25 mg total) by mouth daily.  Take with or immediately following a meal. 90 tablet 3  . mirtazapine (REMERON) 15 MG tablet Take 1 tablet (15 mg total) by mouth at bedtime. 30 tablet 1  . montelukast (SINGULAIR) 10 MG tablet TAKE ONE TABLET BY MOUTH AT BEDTIME 30 tablet 5  . Probiotic Product (ALIGN PO) Take 1 tablet by mouth daily.     No current facility-administered medications for this visit.     Allergies:   Albuterol; Alendronate sodium; Amoxicillin-pot clavulanate; Atorvastatin; Azithromycin; Ezetimibe-simvastatin; Gabapentin; Ibandronate sodium; Lexapro [escitalopram oxalate]; Risedronate sodium; Rosuvastatin;  Sertraline; and Sulfonamide derivatives    Social History:  The patient  reports that she has never smoked. She has never used smokeless tobacco. She reports that she does not drink alcohol or use drugs.   Family History:  [name] family history includes Aneurysm in her father; Anxiety disorder in her daughter; Breast cancer in her daughter, daughter, and mother; Cancer in her daughter and sister; Colon cancer in her sister; Hypertension in her father and mother.    ROS:  Please see the history of present illness.   Otherwise, review of systems are positive for none.   All other systems are reviewed and negative.    PHYSICAL EXAM: VS:  BP (!) 152/78 (BP Location: Right Arm, Patient Position: Sitting, Cuff Size: Normal)   Pulse 100   Ht 4\' 11"  (1.499 m)   Wt 133 lb 8 oz (60.6 kg)   BMI 26.96 kg/m  , BMI Body mass index is 26.96 kg/m. GEN: Well nourished, well developed, in no acute distress  HEENT: normal  Neck: no JVD, carotid bruits, or masses Cardiac: RRR with frequent premature beats; no murmurs, rubs, or gallops,no edema  Respiratory:  clear to auscultation bilaterally, normal work of breathing GI: soft, nontender, nondistended, + BS MS: no deformity or atrophy  Skin: warm and dry, no rash Neuro:  Strength and sensation are intact Psych: euthymic mood, full affect   EKG:  EKG is ordered today. The ekg ordered today demonstrates normal sinus rhythm with frequent PVCs. Low voltage.   Recent Labs: 01/09/2016: ALT 11; BUN 20; Creatinine, Ser 0.99; Hemoglobin 14.8; Platelets 228.0; Potassium 4.6; Sodium 139; TSH 3.65    Lipid Panel    Component Value Date/Time   CHOL 243 (H) 01/06/2013 1157   TRIG 186.0 (H) 01/06/2013 1157   HDL 59.40 01/06/2013 1157   CHOLHDL 4 01/06/2013 1157   VLDL 37.2 01/06/2013 1157   LDLCALC 107 (H) 01/05/2010 1003   LDLDIRECT 148.1 01/06/2013 1157      Wt Readings from Last 3 Encounters:  09/03/16 133 lb 8 oz (60.6 kg)  08/22/16 133  lb 8 oz (60.6 kg)  07/19/16 135 lb (61.2 kg)       No flowsheet data found.    ASSESSMENT AND PLAN:  1.  Symptomatic PVCs: The patient continues to have significant PVCs on her EKG. I think we need to quantify the amount to see if up titration of Toprol as needed. I requested a 24-hour Holter monitor. Previous echocardiogram in 2014 showed no evidence of cardiomyopathy or structural heart disease.  2. History of sinus tachycardia: Reasonably controlled with metoprolol.  3. Essential hypertension: She reports significant fluctuation in blood pressure with occasional episodes of hypotension. Thus, I made no changes today.    Disposition:   FU with me in 6 months  Signed,  Kathlyn Sacramento, MD  09/03/2016 1:49 PM    [facility]

## 2016-09-03 NOTE — Patient Instructions (Addendum)
Medication Instructions:  Your physician recommends that you continue on your current medications as directed. Please refer to the Current Medication list given to you today.   Labwork: none  Testing/Procedures: Your physician has recommended that you wear a holter monitor. Holter monitors are medical devices that record the heart's electrical activity. Doctors most often use these monitors to diagnose arrhythmias. Arrhythmias are problems with the speed or rhythm of the heartbeat. The monitor is a small, portable device. You can wear one while you do your normal daily activities. This is usually used to diagnose what is causing palpitations/syncope (passing out).    Follow-Up: Your physician wants you to follow-up in: six months with Dr. Fletcher Anon.  You will receive a reminder letter in the mail two months in advance. If you don't receive a letter, please call our office to schedule the follow-up appointment. c  Any Other Special Instructions Will Be Listed Below (If Applicable).     If you need a refill on your cardiac medications before your next appointment, please call your pharmacy.   Holter Monitoring A Holter monitor is a small device that is used to detect abnormal heart rhythms. It clips to your clothing and is connected by wires to flat, sticky disks (electrodes) that attach to your chest. It is worn continuously for 24-48 hours. HOME CARE INSTRUCTIONS  Wear your Holter monitor at all times, even while exercising and sleeping, for as long as directed by your health care provider.  Make sure that the Holter monitor is safely clipped to your clothing or close to your body as recommended by your health care provider.  Do not get the monitor or wires wet.  Do not put body lotion or moisturizer on your chest.  Keep your skin clean.  Keep a diary of your daily activities, such as walking and doing chores. If you feel that your heartbeat is abnormal or that your heart is  fluttering or skipping a beat:  Record what you are doing when it happens.  Record what time of day the symptoms occur.  Return your Holter monitor as directed by your health care provider.  Keep all follow-up visits as directed by your health care provider. This is important. SEEK IMMEDIATE MEDICAL CARE IF:  You feel lightheaded or you faint.  You have trouble breathing.  You feel pain in your chest, upper arm, or jaw.  You feel sick to your stomach and your skin is pale, cool, or damp.  You heartbeat feels unusual or abnormal.   This information is not intended to replace advice given to you by your health care provider. Make sure you discuss any questions you have with your health care provider.   Document Released: 08/31/2004 Document Revised: 12/24/2014 Document Reviewed: 07/12/2014 Elsevier Interactive Patient Education [employer].

## 2016-09-13 ENCOUNTER — Ambulatory Visit (INDEPENDENT_AMBULATORY_CARE_PROVIDER_SITE_OTHER): Payer: Commercial Managed Care - HMO

## 2016-09-13 ENCOUNTER — Ambulatory Visit: Payer: Commercial Managed Care - HMO

## 2016-09-13 DIAGNOSIS — I493 Ventricular premature depolarization: Secondary | ICD-10-CM | POA: Diagnosis not present

## 2016-09-21 ENCOUNTER — Other Ambulatory Visit: Payer: Self-pay

## 2016-09-21 DIAGNOSIS — I493 Ventricular premature depolarization: Secondary | ICD-10-CM

## 2016-09-21 MED ORDER — METOPROLOL SUCCINATE ER 25 MG PO TB24: 50.0000 mg | ORAL_TABLET | Freq: Every day | ORAL | 3 refills | Status: DC

## 2016-10-10 ENCOUNTER — Ambulatory Visit (INDEPENDENT_AMBULATORY_CARE_PROVIDER_SITE_OTHER): Payer: Commercial Managed Care - HMO

## 2016-10-10 ENCOUNTER — Other Ambulatory Visit: Payer: Self-pay

## 2016-10-10 DIAGNOSIS — I493 Ventricular premature depolarization: Secondary | ICD-10-CM | POA: Diagnosis not present

## 2016-10-22 ENCOUNTER — Ambulatory Visit (INDEPENDENT_AMBULATORY_CARE_PROVIDER_SITE_OTHER): Payer: Commercial Managed Care - HMO | Admitting: Cardiovascular Disease

## 2016-10-22 ENCOUNTER — Encounter: Payer: Self-pay | Admitting: Cardiovascular Disease

## 2016-10-22 VITALS — BP 100/80 | HR 86 | Ht 59.0 in | Wt 132.8 lb

## 2016-10-22 DIAGNOSIS — I493 Ventricular premature depolarization: Secondary | ICD-10-CM

## 2016-10-22 DIAGNOSIS — I471 Supraventricular tachycardia: Secondary | ICD-10-CM

## 2016-10-22 DIAGNOSIS — Z23 Encounter for immunization: Secondary | ICD-10-CM

## 2016-10-22 MED ORDER — METOPROLOL SUCCINATE ER 50 MG PO TB24: 50.0000 mg | ORAL_TABLET | Freq: Every day | ORAL | 6 refills | Status: DC

## 2016-10-22 NOTE — Patient Instructions (Signed)
Medication Instructions: Continue same medications.   Labwork: None.   Procedures/Testing: None.   Follow-Up: 6 months with Dr. Arida.   Any Additional Special Instructions Will Be Listed Below (If Applicable).     If you need a refill on your cardiac medications before your next appointment, please call your pharmacy.   

## 2016-10-22 NOTE — Progress Notes (Signed)
Cardiology Office Note   Date:  10/22/2016   ID:  Jillian Oconnell, DOB Aug 31, 1934, MRN [id number]  PCP:  Elsie Stain, MD  Cardiologist:   Kathlyn Sacramento, MD   Chief Complaint  Patient presents with  . Symptomatic PVCs  . Paroxysmal supraventricular tachycardia ([place])  . F/U to echo      History of Present Illness: Jillian Oconnell is a 82 y.o. female who presents for a followup visit regarding palpitations with known history of sinus tachycardia, possible paroxysmal supraventricular tachycardia and frequent  PVCs.  She had previous cardiac catheterization in February of 2009 which showed minor luminal irregularities with no evidence of obstructive disease. Ejection fraction was normal. Palpitations worsened in 04/07/2014 after the death of her husband. She was treated with diltiazem but then switched to a small dose metoprolol. Most recent echocardiogram in Apr 07, 2013 showed normal LV systolic function, grade 1 diastolic dysfunction and no significant pulmonary hypertension.  She had worsening palpitations recently. I repeated Holter monitor which showed very frequent PVCs with a total of 34,000 beats representing 25% burden. I increased the dose of Toprol to 50 mg once daily. I repeated echocardiogram which showed normal LV systolic function with an EF of 60-65% with no significant valvular abnormalities. She reports significant improvement in symptoms since increasing Toprol. She denies any chest pain and reports only mild exertional dyspnea.   Past Medical History:  Diagnosis Date  . Allergy   . Anxiety   . Asthma   . Depression   . GERD (gastroesophageal reflux disease)   . Glaucoma   . Hyperlipidemia   . Hypertension   . IBS (irritable bowel syndrome)   . OA (osteoarthritis) of knee    injections  . Osteopenia    DXA 04-07-2010  . Palpitations   . Paroxysmal supraventricular tachycardia ([place]) May of 2005  . 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  Shingles    post herpetic neuralgia (pain clinic in past)    Past Surgical History:  Procedure Laterality Date  . BREAST EXCISIONAL BIOPSY Right 1988   NEG  . CARDIAC CATHETERIZATION  01/2008   65% ;[place]. Minor luminal irregularities with no evidence of obstructive disease.  . cataract surgery    . [place]  . RECTOCELE REPAIR  2003-04-08  . TOTAL ABDOMINAL HYSTERECTOMY       Current Outpatient Prescriptions  Medication Sig Dispense Refill  . albuterol (PROVENTIL HFA;VENTOLIN HFA) 108 (90 BASE) MCG/ACT inhaler Inhale 1 puff into the lungs every 6 (six) hours as needed for wheezing. Reported on 02/21/2016    . Calcium Carbonate-Vitamin D (CALTRATE 600+D) 600-400 MG-UNIT per tablet Take 1 tablet by mouth two times a day    . fluticasone (FLONASE) 50 MCG/ACT nasal spray USE TWO SPRAYS IN EACH NOSTRIL ONCE DAILY 16 g 12  . Fluticasone Propionate, Inhal, (FLOVENT DISKUS) 250 MCG/BLIST AEPB INHALE ONE PUFF BY MOUTH TWICE DAILY *RINSE MOUTH AFTER USE* 60 each 12  . HYDROcodone-acetaminophen (NORCO) 7.5-325 MG tablet TAKE ONE TABLET BY MOUTH EVERY 8 HOURS AS NEEDED FOR PAIN 270 tablet 0  . lansoprazole (PREVACID) 30 MG capsule TAKE ONE CAPSULE BY MOUTH ONCE DAILY 30 capsule 11  . loratadine (CLARITIN) 10 MG tablet Take 1 tablet (10 mg total) by mouth daily. 90 tablet 3  . LORazepam (ATIVAN) 1 MG tablet TAKE ONE TABLET BY MOUTH TWICE DAILY TO THREE TIMES DAILY FOR  ANXIETY 90 tablet 2  . metoprolol succinate (TOPROL-XL) 25 MG 24 hr tablet Take 2 tablets (50 mg total) by mouth daily. Take with or immediately following a meal. 60 tablet 3  . mirtazapine (REMERON) 15 MG tablet Take 1 tablet (15 mg total) by mouth at bedtime. 30 tablet 1  . montelukast (SINGULAIR) 10 MG tablet TAKE ONE TABLET BY MOUTH AT BEDTIME 30 tablet 5  . Probiotic Product (ALIGN PO) Take 1 tablet by mouth daily.     No current facility-administered medications for this visit.     Allergies:   Albuterol; Alendronate  sodium; Amoxicillin-pot clavulanate; Atorvastatin; Azithromycin; Ezetimibe-simvastatin; Gabapentin; Ibandronate sodium; Lexapro [escitalopram oxalate]; Risedronate sodium; Rosuvastatin; Sertraline; and Sulfonamide derivatives    Social History:  The patient  reports that she has never smoked. She has never used smokeless tobacco. She reports that she does not drink alcohol or use drugs.   Family History:  [name] family history includes Aneurysm in her father; Anxiety disorder in her daughter; Breast cancer in her daughter, daughter, and mother; Cancer in her daughter and sister; Colon cancer in her sister; Hypertension in her father and mother.    ROS:  Please see the history of present illness.   Otherwise, review of systems are positive for none.   All other systems are reviewed and negative.    PHYSICAL EXAM: VS:  BP 100/80   Pulse 86   Ht 4\' 11"  (1.499 m)   Wt 132 lb 12.8 oz (60.2 kg)   SpO2 94%   BMI 26.82 kg/m  , BMI Body mass index is 26.82 kg/m. GEN: Well nourished, well developed, in no acute distress  HEENT: normal  Neck: no JVD, carotid bruits, or masses Cardiac: RRR with frequent premature beats; no murmurs, rubs, or gallops,no edema  Respiratory:  clear to auscultation bilaterally, normal work of breathing GI: soft, nontender, nondistended, + BS MS: no deformity or atrophy  Skin: warm and dry, no rash Neuro:  Strength and sensation are intact Psych: euthymic mood, full affect   EKG:  EKG is not ordered today.    Recent Labs: 01/09/2016: ALT 11; BUN 20; Creatinine, Ser 0.99; Hemoglobin 14.8; Platelets 228.0; Potassium 4.6; Sodium 139; TSH 3.65    Lipid Panel    Component Value Date/Time   CHOL 243 (H) 01/06/2013 1157   TRIG 186.0 (H) 01/06/2013 1157   HDL 59.40 01/06/2013 1157   CHOLHDL 4 01/06/2013 1157   VLDL 37.2 01/06/2013 1157   LDLCALC 107 (H) 01/05/2010 1003   LDLDIRECT 148.1 01/06/2013 1157      Wt Readings from Last 3 Encounters:    10/22/16 132 lb 12.8 oz (60.2 kg)  09/03/16 133 lb 8 oz (60.6 kg)  08/22/16 133 lb 8 oz (60.6 kg)       No flowsheet data found.    ASSESSMENT AND PLAN:  1.  Symptomatic PVCs: Her symptoms improved significantly after increasing the dose of Toprol to 50 mg once daily she is currently feeling well. I did not hear any premature beats by physical exam even after prolonged listening to her heart. Recent echocardiogram showed normal LV systolic function and no significant valvular abnormalities.  2. History of sinus tachycardia: Controlled with metoprolol.  3. Essential hypertension: Blood pressure is controlled.  The patient was given the flu shot today per her request.  Disposition:   FU with me in 6 months  Signed,  Kathlyn Sacramento, MD  10/22/2016 2:14 PM    [facility]

## 2016-10-25 ENCOUNTER — Telehealth: Payer: Self-pay | Admitting: Family Medicine

## 2016-10-25 NOTE — Telephone Encounter (Signed)
Patient Name: Jillian Oconnell DOB: [birth year] Initial Comment Fell this morning at 4:00am, trying to getting her house slipper on. Fell on her knees and her face and head. Nurse Assessment Nurse: Ronnald Ramp, RN, Miranda Date/Time (Eastern Time): 10/25/2016 9:04:23 AM Confirm and document reason for call. If symptomatic, describe symptoms. You must click the next button to save text entered. ---Caller states she fell this morning. She landed on the right side of her face. Also hit her mouth and her nose. She had a nose bleed. Bruises on her knees and scrapes on her left hand. Has the patient traveled out of the country within the last 30 days? ---Not Applicable Does the patient have any new or worsening symptoms? ---Yes Will a triage be completed? ---Yes Related visit to physician within the last 2 weeks? ---No Does the PT have any chronic conditions? (i.e. diabetes, asthma, etc.) ---Yes List chronic conditions. ---GERD, Chronic pain from shingles, Heart rate Is this a behavioral health or substance abuse call? ---No Guidelines Guideline Title Affirmed Question Affirmed Notes Face Injury Large swelling or bruise > 2 inches (5 cm) Final Disposition User See Physician within 24 Hours Jones, RN, Miranda Referrals REFERRED TO PCP OFFICE Disagree/Comply: Leta Baptist

## 2016-10-25 NOTE — Telephone Encounter (Signed)
Does she need to be seen today?  Add on if needed today.  Thanks.

## 2016-10-25 NOTE — Telephone Encounter (Signed)
TH scheduled appt with Dr Damita Dunnings 10/26/16 at 2:30.

## 2016-10-25 NOTE — Telephone Encounter (Signed)
Patient Name: Jillian Oconnell  DOB: 13-Nov-1934    Initial Comment Pt fell earlier this morning- appt time is tomorrow at 230- now coughing up blood   Nurse Assessment  Nurse: Raphael Gibney, RN, Vanita Ingles Date/Time (Eastern Time): 10/25/2016 1:45:07 PM  Confirm and document reason for call. If symptomatic, describe symptoms. You must click the next button to save text entered. ---Caller states she fell about 4 am this am. Has appt at 2:30 am tomorrow. She has coughed up small amount of blood. She hit her cheek bone and her face. has a big bruise on her face and her head. Has bruises on her hands and knees. Has swelling on her face.  Has the patient traveled out of the country within the last 30 days? ---Not Applicable  Does the patient have any new or worsening symptoms? ---Yes  Will a triage be completed? ---Yes  Related visit to physician within the last 2 weeks? ---No  Does the PT have any chronic conditions? (i.e. diabetes, asthma, etc.) ---No  Is this a behavioral health or substance abuse call? ---No     Guidelines    Guideline Title Affirmed Question Affirmed Notes  Face Injury Large swelling or bruise > 2 inches (5 cm)    Final Disposition User   See Physician within 24 Hours Stringer, RN, Vanita Ingles    Comments  pt states she has appt tomorrow at 2:30 and will just keep that rather than going to urgent care or ER today.   Referrals  REFERRED TO PCP OFFICE   Disagree/Comply: Comply

## 2016-10-25 NOTE — Telephone Encounter (Signed)
I spoke with pt and she has not coughed up any more blood and wants to wait and see Dr Damita Dunnings 10/26/16 at 2:30 but pt did say if she coughed up any more blood she would go to ED prior to appt.FYI to Dr Damita Dunnings.

## 2016-10-25 NOTE — Telephone Encounter (Signed)
Patient says the swelling is better already.  She has coughed up some blood a few times but not currently.  She thinks the blood was from her sinus issues.  Patient will keep the appt tomorrow.

## 2016-10-26 ENCOUNTER — Encounter: Payer: Self-pay | Admitting: Family Medicine

## 2016-10-26 ENCOUNTER — Ambulatory Visit (INDEPENDENT_AMBULATORY_CARE_PROVIDER_SITE_OTHER): Payer: Commercial Managed Care - HMO | Admitting: Family Medicine

## 2016-10-26 VITALS — BP 118/64 | HR 89 | Temp 97.5°F | Wt 132.5 lb

## 2016-10-26 DIAGNOSIS — B0229 Other postherpetic nervous system involvement: Secondary | ICD-10-CM | POA: Diagnosis not present

## 2016-10-26 DIAGNOSIS — S0083XA Contusion of other part of head, initial encounter: Secondary | ICD-10-CM | POA: Diagnosis not present

## 2016-10-26 DIAGNOSIS — S060X0A Concussion without loss of consciousness, initial encounter: Secondary | ICD-10-CM | POA: Diagnosis not present

## 2016-10-26 DIAGNOSIS — W19XXXA Unspecified fall, initial encounter: Secondary | ICD-10-CM | POA: Diagnosis not present

## 2016-10-26 DIAGNOSIS — Y92009 Unspecified place in unspecified non-institutional (private) residence as the place of occurrence of the external cause: Secondary | ICD-10-CM

## 2016-10-26 DIAGNOSIS — Y92099 Unspecified place in other non-institutional residence as the place of occurrence of the external cause: Secondary | ICD-10-CM

## 2016-10-26 DIAGNOSIS — F411 Generalized anxiety disorder: Secondary | ICD-10-CM

## 2016-10-26 MED ORDER — HYDROCODONE-ACETAMINOPHEN 7.5-325 MG PO TABS: ORAL_TABLET | ORAL | 0 refills | Status: DC

## 2016-10-26 NOTE — Patient Instructions (Signed)
Keep icing as needed and update me as needed.  I think getting a fall alert button is reasonable.  Take care.  Glad to see you.

## 2016-10-26 NOTE — Progress Notes (Signed)
Pre visit review using our clinic review tool, if applicable. No additional management support is needed unless otherwise documented below in the visit note. 

## 2016-10-26 NOTE — Progress Notes (Signed)
She didn't start remeron as her mood improved in the meantime.  D/w pt.    Fell and hit R side of her face yesterday AM.  She usually gets up at ~4AM to go to [facility].  She prev had a high bed, lost her balance getting out of bed.  She got a lower mattress and box springs today, so it will be easier to get in and out of bed.   She went to floor, saw stars when she hit.  She spit up some blood today, a small amount, she thought it was from bloody post nasal gtt.  Minimal HA. Family helped her up after the event.  She always has family at home at night.   R knee and R arm sore from the fall but able to bear weight.   Still with some pain from shingles on the chest well, still taking prn vicodin.  No ADE on med.  Not constipated from the medicine.   PMH and SH reviewed  ROS: Per HPI unless specifically indicated in ROS section   Meds, vitals, and allergies reviewed.   nad Bruised R mandile and R maxilla.  Tympanic membranes within normal limits. Nasal exam within normal limits. Her forehead and maxillary areas are not tender. Extraocular movements are intact. Oropharynx within normal limits with the exception of a scant amount of blood-tinged postnasal drainage. There is no active bleeding that I can see otherwise in the oropharynx or in the nasal cavity.  Neck is supple, no lymphadenopathy Heart is regular Lungs clear Abdomen soft Extremities without edema. R knee has a small abrasion but she has normal range of motion otherwise. Able to bear weight.  Her R arm is sore slightly sore with range of motion of the shoulder but she has no arm drop.   L thumb is proximally bruised but she has normal range of motion and is able to make a fist. Distally neurovascularly intact CN 2-12 wnl B, S/S wnl x4

## 2016-10-28 ENCOUNTER — Encounter: Payer: Self-pay | Admitting: Family Medicine

## 2016-10-28 DIAGNOSIS — W19XXXA Unspecified fall, initial encounter: Secondary | ICD-10-CM | POA: Insufficient documentation

## 2016-10-28 DIAGNOSIS — S060X0A Concussion without loss of consciousness, initial encounter: Secondary | ICD-10-CM | POA: Insufficient documentation

## 2016-10-28 DIAGNOSIS — Y92009 Unspecified place in unspecified non-institutional (private) residence as the place of occurrence of the external cause: Secondary | ICD-10-CM | POA: Insufficient documentation

## 2016-10-28 DIAGNOSIS — S0083XA Contusion of other part of head, initial encounter: Secondary | ICD-10-CM | POA: Insufficient documentation

## 2016-10-28 NOTE — Assessment & Plan Note (Signed)
It does not appear that we need to image her since she does not have point tenderness over any bony prominences. She has normal range of motion of the mandible. She has a scant amount of blood-tinged postnasal drip. I expect this to resolve over the next few days. I can't see a source of bleeding otherwise and it would likely be counterproductive to try to pack her nasal cavity. She agrees with all this. Update me as needed. Bruising should resolve.

## 2016-10-28 NOTE — Assessment & Plan Note (Signed)
She normally has someone at home with her. I talked with her about getting a fall alert button so that she would have it in case she needed it. Routine cautions discussed with patient. She has getting a mattress that is lower to the floor that will be easier to get in and out of.

## 2016-10-28 NOTE — Assessment & Plan Note (Signed)
Discussed with patient about diagnosis. She hard enough on the floor to "see stars". No loss of consciousness. She still likely had a concussion. Discussed with her about concussion symptoms and routine care at this point. She is not having atypical neurologic symptoms or vision changes. Okay for outpatient follow-up. She does not have headaches at this point.

## 2016-10-28 NOTE — Assessment & Plan Note (Signed)
Continue Vicodin. This is the only thing that really helped her pain. At this point still okay for outpatient follow-up. She agrees.

## 2016-10-28 NOTE — Assessment & Plan Note (Signed)
Her mood was improved to the point where she did not need to take Remeron. Continue as is. She agrees.

## 2016-11-13 ENCOUNTER — Other Ambulatory Visit: Payer: Self-pay | Admitting: Family Medicine

## 2016-11-16 ENCOUNTER — Encounter: Payer: Self-pay | Admitting: Obstetrics & Gynecology

## 2016-11-16 ENCOUNTER — Ambulatory Visit (INDEPENDENT_AMBULATORY_CARE_PROVIDER_SITE_OTHER): Payer: Commercial Managed Care - HMO | Admitting: Obstetrics & Gynecology

## 2016-11-16 VITALS — BP 130/61 | HR 81 | Temp 97.8°F | Wt 134.0 lb

## 2016-11-16 DIAGNOSIS — N9089 Other specified noninflammatory disorders of vulva and perineum: Secondary | ICD-10-CM

## 2016-11-16 NOTE — Progress Notes (Signed)
Patient states that she has bumps on vagina and it is uncomfortable when she wipes.

## 2016-11-16 NOTE — Progress Notes (Signed)
   Subjective:    Patient ID: Jillian Oconnell, female    DOB: November 13, 1934, 82 y.o.   MRN: [id number]  HPI  82 yo [place] lady here because the milia type bumps on her vulva are still there and they are annoying her.  Review of Systems     Objective:   Physical Exam [id number] Breathing, conversing, and ambulating normally Vulva- same milia type bumps on left labia minora Pathology from vulvar biopsy last visit showed hyperkeratosis       Assessment & Plan:  Hyperkeratosis- temovate QOD  RTC 1 month if not better

## 2016-11-22 ENCOUNTER — Encounter: Payer: Self-pay | Admitting: Urgent Care

## 2016-11-22 ENCOUNTER — Encounter: Payer: Self-pay | Admitting: Internal Medicine

## 2016-11-22 ENCOUNTER — Ambulatory Visit (INDEPENDENT_AMBULATORY_CARE_PROVIDER_SITE_OTHER): Payer: Commercial Managed Care - HMO | Admitting: Internal Medicine

## 2016-11-22 VITALS — BP 118/70 | HR 99 | Temp 97.6°F | Wt 133.5 lb

## 2016-11-22 DIAGNOSIS — B9689 Other specified bacterial agents as the cause of diseases classified elsewhere: Secondary | ICD-10-CM

## 2016-11-22 DIAGNOSIS — J019 Acute sinusitis, unspecified: Secondary | ICD-10-CM

## 2016-11-22 DIAGNOSIS — R000 Tachycardia, unspecified: Secondary | ICD-10-CM | POA: Diagnosis not present

## 2016-11-22 DIAGNOSIS — J45909 Unspecified asthma, uncomplicated: Secondary | ICD-10-CM | POA: Diagnosis not present

## 2016-11-22 DIAGNOSIS — I10 Essential (primary) hypertension: Secondary | ICD-10-CM | POA: Diagnosis not present

## 2016-11-22 MED ORDER — AMOXICILLIN 875 MG PO TABS: 875.0000 mg | ORAL_TABLET | Freq: Two times a day (BID) | ORAL | 0 refills | Status: DC

## 2016-11-22 NOTE — ED Triage Notes (Addendum)
Patient presents to the ED tonight with c/o elevated blood pressures tonight. Patient reports that she was seen by her PCP today and treated for a acute sinusitis; prescribed Amoxicillin. Advises that she developed a headache tonight; checked blood pressure and it was found to be 160/120, prompting her to present for care. Headache has resolved at this point.

## 2016-11-22 NOTE — Patient Instructions (Signed)

## 2016-11-22 NOTE — Progress Notes (Signed)
HPI  Pt presents to the clinic today with c/o headache facial pain and pressure, nasal congestion, and cough. This started 2 weeks ago. She is blowing yellow mucous out of her nose. The cough is productive of yellow mucous. She denies fever, chills or body aches. She has tried Singulair, Claritin and Flonase with some relief. She has a history of allergies and asthma. She has not had sick contacts. She reports she gets frequent sinus infections, last 08/22/16, treated with Doxycycline.  Review of Systems     Past Medical History:  Diagnosis Date  . Allergy   . Anxiety   . Asthma   . Concussion    with fall at home 10/2016  . Depression   . GERD (gastroesophageal reflux disease)   . Glaucoma   . Hyperlipidemia   . Hypertension   . IBS (irritable bowel syndrome)   . OA (osteoarthritis) of knee    injections  . Osteopenia    DXA 2010/03/20  . Palpitations   . Paroxysmal supraventricular tachycardia ([place]) May of 2005  . 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 Shingles    post herpetic neuralgia (pain clinic in past)    Family History  Problem Relation Age of Onset  . Hypertension Mother   . Breast cancer Mother   . Cancer Sister     colon  . Colon cancer Sister   . Hypertension Father   . Aneurysm Father     AAA  . Cancer Daughter     thyroid  . Anxiety disorder Daughter   . Breast cancer Daughter   . Breast cancer Daughter     Social History   Social History  . Marital status: Married    Spouse name: N/A  . Number of children: N/A  . Years of education: N/A   Occupational History  . Retired Retired    Agricultural engineer   Social History Main Topics  . Smoking status: Never Smoker  . Smokeless tobacco: Never Used  . Alcohol use No  . Drug use: No  . Sexual activity: Not Currently    Birth control/ protection: Post-menopausal   Other Topics Concern  . Not on file   Social History Narrative   Regular exercise- no    Moved here from [place]-  to be closer to family    Husband died of lung CA 03-20-2013    Allergies  Allergen Reactions  . Albuterol     REACTION: jittery  . Alendronate Sodium     REACTION: GI side eff  . Amoxicillin-Pot Clavulanate     REACTION: non tolerant- but can take plain amox  . Atorvastatin   . Azithromycin     GI upset.  Not an allergy.  . Ezetimibe-Simvastatin     REACTION: myalgia  . Gabapentin     REACTION: tremor  . Ibandronate Sodium     REACTION: GI side eff  . Lexapro [Escitalopram Oxalate]     sedation  . Risedronate Sodium     REACTION: GI side eff  . Rosuvastatin     REACTION: myalgia  . Sertraline     sedation  . Sulfonamide Derivatives     REACTION: non tolerate     Constitutional: Positive headache, fatigue. Denies fever or abrupt weight changes.  HEENT:  Positive facial pain, nasal congestion. Denies eye redness, ear pain, ringing in the ears, wax buildup, runny nose or sore throat. Respiratory: Positive  cough. Denies difficulty breathing or shortness of breath.  Cardiovascular: Denies chest pain, chest tightness, palpitations or swelling in the hands or feet.   No other specific complaints in a complete review of systems (except as listed in HPI above).  Objective:   BP 118/70   Pulse 99   Temp 97.6 F (36.4 C) (Oral)   Wt 133 lb 8 oz (60.6 kg)   SpO2 96%   BMI 26.96 kg/m   General: Appears her stated age, well developed, well nourished in NAD. HEENT: Head: normal shape and size, maxillary sinus tenderness noted; Eyes: sclera white, no icterus, conjunctiva pink; Ears: Tm's gray and intact, normal light reflex; Nose: mucosa boggy and moist, septum midline; Throat/Mouth: + PND. Teeth present, mucosa pink and moist, no exudate noted, no lesions or ulcerations noted.  Neck:  No adenopathy noted.  Cardiovascular: Normal rate and rhythm.  Pulmonary/Chest: Normal effort and positive vesicular breath sounds. No respiratory distress. No wheezes, rales or ronchi noted.        Assessment & Plan:   Acute bacterial sinusitis  Can use a Neti Pot which can be purchased from your local drug store. Continue Claritin, Flonase and Singulair. eRx for Amoxil BID for 10 days  RTC as needed or if symptoms persist. Webb Silversmith, NP

## 2016-11-23 ENCOUNTER — Ambulatory Visit (INDEPENDENT_AMBULATORY_CARE_PROVIDER_SITE_OTHER): Payer: Commercial Managed Care - HMO | Admitting: Cardiovascular Disease

## 2016-11-23 ENCOUNTER — Encounter: Payer: Self-pay | Admitting: Cardiovascular Disease

## 2016-11-23 ENCOUNTER — Emergency Department
Admission: EM | Admit: 2016-11-23 | Discharge: 2016-11-23 | Disposition: A | Discharge status: Home / Self Care | Payer: Commercial Managed Care - HMO | Attending: Emergency Medicine | Admitting: Emergency Medicine

## 2016-11-23 VITALS — BP 170/90 | HR 105 | Ht <= 58 in | Wt 134.0 lb

## 2016-11-23 DIAGNOSIS — I471 Supraventricular tachycardia, unspecified: Secondary | ICD-10-CM

## 2016-11-23 DIAGNOSIS — I10 Essential (primary) hypertension: Secondary | ICD-10-CM

## 2016-11-23 DIAGNOSIS — I493 Ventricular premature depolarization: Secondary | ICD-10-CM | POA: Diagnosis not present

## 2016-11-23 DIAGNOSIS — R000 Tachycardia, unspecified: Secondary | ICD-10-CM

## 2016-11-23 LAB — BASIC METABOLIC PANEL
Anion gap: 8 (ref 5–15)
BUN: 22 mg/dL — AB (ref 6–20)
CHLORIDE: 102 mmol/L (ref 101–111)
CO2: 32 mmol/L (ref 22–32)
Calcium: 9.8 mg/dL (ref 8.9–10.3)
Creatinine, Ser: 1.21 mg/dL — ABNORMAL HIGH (ref 0.44–1.00)
GFR calc non Af Amer: 41 mL/min — ABNORMAL LOW (ref >60)
GFR, EST AFRICAN AMERICAN: 48 mL/min — AB (ref >60)
Glucose, Bld: 110 mg/dL — ABNORMAL HIGH (ref 65–99)
POTASSIUM: 4.2 mmol/L (ref 3.5–5.1)
SODIUM: 142 mmol/L (ref 135–145)

## 2016-11-23 LAB — MAGNESIUM: Magnesium: 2 mg/dL (ref 1.7–2.4)

## 2016-11-23 LAB — CBC WITH DIFFERENTIAL/PLATELET
Basophils Absolute: 0.1 10*3/uL (ref 0–0.1)
Basophils Relative: 1 %
Eosinophils Absolute: 0.3 10*3/uL (ref 0–0.7)
Eosinophils Relative: 4 %
HCT: 44.6 % (ref 35.0–47.0)
HEMOGLOBIN: 15 g/dL (ref 12.0–16.0)
LYMPHS ABS: 1.9 10*3/uL (ref 1.0–3.6)
LYMPHS PCT: 27 %
MCH: 30.1 pg (ref 26.0–34.0)
MCHC: 33.7 g/dL (ref 32.0–36.0)
MCV: 89.5 fL (ref 80.0–100.0)
Monocytes Absolute: 0.5 10*3/uL (ref 0.2–0.9)
Monocytes Relative: 7 %
NEUTROS PCT: 61 %
Neutro Abs: 4.3 10*3/uL (ref 1.4–6.5)
Platelets: 218 10*3/uL (ref 150–440)
RBC: 4.99 MIL/uL (ref 3.80–5.20)
RDW: 13.6 % (ref 11.5–14.5)
WBC: 7 10*3/uL (ref 3.6–11.0)

## 2016-11-23 LAB — PHOSPHORUS: Phosphorus: 3.7 mg/dL (ref 2.5–4.6)

## 2016-11-23 MED ORDER — LABETALOL HCL 5 MG/ML IV SOLN
10.0000 mg | Freq: Once | INTRAVENOUS | Status: AC
  Administered 2016-11-23: 10 mg via INTRAVENOUS
  Filled 2016-11-23: qty 4

## 2016-11-23 MED ORDER — SODIUM CHLORIDE 0.9 % IV BOLUS (SEPSIS)
1000.0000 mL | Freq: Once | INTRAVENOUS | Status: AC
  Administered 2016-11-23: 1000 mL via INTRAVENOUS

## 2016-11-23 MED ORDER — METOPROLOL TARTRATE 25 MG PO TABS
25.0000 mg | ORAL_TABLET | Freq: Once | ORAL | Status: AC
  Administered 2016-11-23: 25 mg via ORAL
  Filled 2016-11-23: qty 1

## 2016-11-23 MED ORDER — METOPROLOL SUCCINATE ER 50 MG PO TB24: 50.0000 mg | ORAL_TABLET | Freq: Two times a day (BID) | ORAL | 3 refills | Status: DC

## 2016-11-23 NOTE — Patient Instructions (Signed)
Medication Instructions:  Your physician has recommended you make the following change in your medication:  INCREASE metoprolol to 50mg  twice daily   Labwork: none  Testing/Procedures: none  Follow-Up: Your physician recommends that you schedule a follow-up appointment in: 1 month with Dr. Fletcher Anon.    Any Other Special Instructions Will Be Listed Below (If Applicable).     If you need a refill on your cardiac medications before your next appointment, please call your pharmacy.

## 2016-11-23 NOTE — ED Notes (Signed)
Pt ambulatory to toilet with no assistance.  

## 2016-11-23 NOTE — Progress Notes (Signed)
Cardiology Office Note   Date:  11/23/2016   ID:  Jillian Oconnell, DOB 1934-09-23, MRN [id number]  PCP:  Jillian Stain, MD  Cardiologist:   Jillian Sacramento, MD   Chief Complaint  Patient presents with  . OTHER    F/u ED Elevated BP and tachycardia. Meds reviewed verbally with pt.      History of Present Illness: Jillian Oconnell is a 82 y.o. female who presents for a followup visit regarding palpitations with known history of sinus tachycardia, possible paroxysmal supraventricular tachycardia and frequent  PVCs.  She had previous cardiac catheterization in February of 2009 which showed minor luminal irregularities with no evidence of obstructive disease. Ejection fraction was normal. Palpitations worsened in 2014/03/23 after the death of her husband. She was treated with diltiazem but then switched to a small dose metoprolol. Most recent echocardiogram in 03/23/2013 showed normal LV systolic function, grade 1 diastolic dysfunction and no significant pulmonary hypertension.  She had worsening palpitations recently. I repeated Holter monitor which showed very frequent PVCs with a total of 34,000 beats representing 25% burden. I increased the dose of Toprol to 50 mg once daily. I repeated echocardiogram which showed normal LV systolic function with an EF of 60-65% with no significant valvular abnormalities.  Her symptoms initially improved after increasing metoprolol. However, she developed recent sinusitis and started having significantly elevated blood pressure and increased palpitations. She went to the emergency room last night with blood pressure above 200. That is somewhat unusual for her but she tends to get anxious quickly. She was given IV labetalol. She had routine labs done which showed slightly elevated creatinine at 1.2.  Her blood pressure is still elevated but she reports resolution of her headache. She also denies any chest pain or shortness of breath.  Past Medical History:  Diagnosis  Date  . Allergy   . Anxiety   . Asthma   . Concussion    with fall at home 10/2016  . Depression   . GERD (gastroesophageal reflux disease)   . Glaucoma   . Hyperlipidemia   . Hypertension   . IBS (irritable bowel syndrome)   . OA (osteoarthritis) of knee    injections  . Osteopenia    DXA Mar 23, 2010  . Palpitations   . Paroxysmal supraventricular tachycardia ([place]) May of 2005  . 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 Shingles    post herpetic neuralgia (pain clinic in past)    Past Surgical History:  Procedure Laterality Date  . BREAST EXCISIONAL BIOPSY Right 1988   NEG  . CARDIAC CATHETERIZATION  01/2008   65% ;[place]. Minor luminal irregularities with no evidence of obstructive disease.  . cataract surgery    . [place]  . RECTOCELE REPAIR  03-24-2003  . TOTAL ABDOMINAL HYSTERECTOMY       Current Outpatient Prescriptions  Medication Sig Dispense Refill  . amoxicillin (AMOXIL) 875 MG tablet Take 1 tablet (875 mg total) by mouth 2 (two) times daily. 20 tablet 0  . Calcium Carbonate-Vitamin D (CALTRATE 600+D) 600-400 MG-UNIT per tablet Take 1 tablet by mouth two times a day    . fluticasone (FLONASE) 50 MCG/ACT nasal spray USE TWO SPRAYS IN EACH NOSTRIL ONCE DAILY 16 g 12  . Fluticasone Propionate, Inhal, (FLOVENT DISKUS) 250 MCG/BLIST AEPB INHALE ONE PUFF BY MOUTH TWICE DAILY *RINSE MOUTH AFTER USE* 60 each 12  . HYDROcodone-acetaminophen (  NORCO) 7.5-325 MG tablet TAKE ONE TABLET BY MOUTH EVERY 8 HOURS AS NEEDED FOR PAIN 270 tablet 0  . lansoprazole (PREVACID) 30 MG capsule TAKE ONE CAPSULE BY MOUTH ONCE DAILY 30 capsule 11  . loratadine (CLARITIN) 10 MG tablet TAKE ONE TABLET BY MOUTH ONCE DAILY 90 tablet 3  . LORazepam (ATIVAN) 1 MG tablet TAKE ONE TABLET BY MOUTH TWICE DAILY TO THREE TIMES DAILY FOR ANXIETY 90 tablet 2  . metoprolol succinate (TOPROL-XL) 50 MG 24 hr tablet Take 1 tablet (50 mg total) by mouth daily. Take with or  immediately following a meal. 30 tablet 6  . montelukast (SINGULAIR) 10 MG tablet TAKE ONE TABLET BY MOUTH AT BEDTIME 30 tablet 5   No current facility-administered medications for this visit.     Allergies:   Albuterol; Alendronate sodium; Amoxicillin-pot clavulanate; Atorvastatin; Azithromycin; Ezetimibe-simvastatin; Gabapentin; Ibandronate sodium; Lexapro [escitalopram oxalate]; Risedronate sodium; Rosuvastatin; Sertraline; and Sulfonamide derivatives    Social History:  The patient  reports that she has never smoked. She has never used smokeless tobacco. She reports that she does not drink alcohol or use drugs.   Family History:  [name] family history includes Aneurysm in her father; Anxiety disorder in her daughter; Breast cancer in her daughter, daughter, and mother; Cancer in her daughter and sister; Colon cancer in her sister; Hypertension in her father and mother.    ROS:  Please see the history of present illness.   Otherwise, review of systems are positive for none.   All other systems are reviewed and negative.    PHYSICAL EXAM: VS:  BP (!) 170/90 (BP Location: Left Arm, Patient Position: Sitting, Cuff Size: Normal)   Pulse (!) 105   Ht 4\' 7"  (1.397 m)   Wt 134 lb (60.8 kg)   BMI 31.14 kg/m  , BMI Body mass index is 31.14 kg/m. GEN: Well nourished, well developed, in no acute distress  HEENT: normal  Neck: no JVD, carotid bruits, or masses Cardiac: RRR with frequent premature beats; no murmurs, rubs, or gallops,no edema  Respiratory:  clear to auscultation bilaterally, normal work of breathing GI: soft, nontender, nondistended, + BS MS: no deformity or atrophy  Skin: warm and dry, no rash Neuro:  Strength and sensation are intact Psych: euthymic mood, full affect   EKG:  EKG is  ordered today. EKG showed sinus tachycardia with frequent PVCs and some in the form of ventricular bigeminy.   Recent Labs: 01/09/2016: ALT 11; TSH 3.65 11/23/2016: BUN 22;  Creatinine, Ser 1.21; Hemoglobin 15.0; Magnesium 2.0; Platelets 218; Potassium 4.2; Sodium 142    Lipid Panel    Component Value Date/Time   CHOL 243 (H) 01/06/2013 1157   TRIG 186.0 (H) 01/06/2013 1157   HDL 59.40 01/06/2013 1157   CHOLHDL 4 01/06/2013 1157   VLDL 37.2 01/06/2013 1157   LDLCALC 107 (H) 01/05/2010 1003   LDLDIRECT 148.1 01/06/2013 1157      Wt Readings from Last 3 Encounters:  11/23/16 134 lb (60.8 kg)  11/22/16 133 lb (60.3 kg)  11/22/16 133 lb 8 oz (60.6 kg)       No flowsheet data found.    ASSESSMENT AND PLAN:  1.  Symptomatic PVCs: Recent echocardiogram showed normal LV systolic function and no significant valvular abnormalities. Symptoms worsened recently in the setting of sinusitis. She is not using decongestants. I elected to increase the dose of Toprol to 50 mg twice daily.  2. History of sinus tachycardia:  She is mildly tachycardic.  Metoprolol was increased.  3. Essential hypertension: Blood pressure is significantly elevated today. Her blood pressure is usually not that high and I elected not to add another medication given that I increased metoprolol.   Disposition:   FU with me in 1 months  Signed,  Jillian Sacramento, MD  11/23/2016 2:25 PM    [place]

## 2016-11-23 NOTE — ED Provider Notes (Signed)
[facility] Emergency Department Provider Note  ____________________________________________  Time seen: Approximately 4:43 AM  I have reviewed the triage vital signs and the nursing notes.   HISTORY  Chief Complaint Hypertension    HPI Jillian Oconnell is a 82 y.o. female who complains of high blood pressure. She also notes that she has a bilateral frontal headache due to sinusitis which she is taking amoxicillin prescribed by PCP. Denies chest pain or shortness of breath. No dizziness or syncope. Follows up with Dr. Fletcher Anon for cardiology. Takes metoprolol XL once daily.Other than noticing that her blood pressure was high and the sinusitis that she's being treated by PCP, she has no acute complaints.     Past Medical History:  Diagnosis Date  . Allergy   . Anxiety   . Asthma   . Concussion    with fall at home 10/2016  . Depression   . GERD (gastroesophageal reflux disease)   . Glaucoma   . Hyperlipidemia   . Hypertension   . IBS (irritable bowel syndrome)   . OA (osteoarthritis) of knee    injections  . Osteopenia    DXA 2011  . Palpitations   . Paroxysmal supraventricular tachycardia ([place]) May of 2005  . 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 Shingles    post herpetic neuralgia (pain clinic in past)     Patient Active Problem List   Diagnosis Date Noted  . Concussion with no loss of consciousness 10/28/2016  . Fall at home 10/28/2016  . Contusion of face 10/28/2016  . Skin lesion 06/29/2016  . Lower back pain 03/28/2016  . Acute non-recurrent maxillary sinusitis 02/21/2016  . Symptomatic PVCs 02/06/2016  . Impingement syndrome of right shoulder 07/20/2015  . Breast cancer screening 04/28/2015  . Colon cancer screening 04/28/2015  . Paroxysmal supraventricular tachycardia ([place])   . Pain in joint, shoulder region 11/09/2013  . Grief 09/19/2013  . Exertional chest pain 01/06/2013  . Back pain 10/12/2012   . CARPAL TUNNEL SYNDROME 01/09/2011  . Osteoporosis 09/06/2010  . PALPITATIONS 04/18/2010  . Depression 03/16/2009  . 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 HYPERLIPIDEMIA 02/23/2009  . Anxiety state 02/23/2009  . HYPERTENSION 02/23/2009  . ALLERGIC RHINITIS 02/23/2009  . ASTHMA 02/23/2009  . GERD 02/23/2009     Past Surgical History:  Procedure Laterality Date  . BREAST EXCISIONAL BIOPSY Right 1988   NEG  . CARDIAC CATHETERIZATION  01/2008   65% ;[place]. Minor luminal irregularities with no evidence of obstructive disease.  . cataract surgery    . [place]  . RECTOCELE REPAIR  2004  . TOTAL ABDOMINAL HYSTERECTOMY       Prior to Admission medications   Medication Sig Start Date End Date Taking? Authorizing Provider  albuterol (PROVENTIL HFA;VENTOLIN HFA) 108 (90 BASE) MCG/ACT inhaler Inhale 1 puff into the lungs every 6 (six) hours as needed for wheezing. Reported on 02/21/2016    Tonia Ghent, MD  amoxicillin (AMOXIL) 875 MG tablet Take 1 tablet (875 mg total) by mouth 2 (two) times daily. 11/22/16   Jearld Fenton, NP  Calcium Carbonate-Vitamin D (CALTRATE 600+D) 600-400 MG-UNIT per tablet Take 1 tablet by mouth two times a day    Historical Provider, MD  fluticasone (FLONASE) 50 MCG/ACT nasal spray USE TWO SPRAYS IN EACH NOSTRIL ONCE DAILY 06/28/16   Tonia Ghent, MD  Fluticasone Propionate, Inhal, (FLOVENT DISKUS)  250 MCG/BLIST AEPB INHALE ONE PUFF BY MOUTH TWICE DAILY *RINSE MOUTH AFTER USE* 06/28/16   Tonia Ghent, MD  HYDROcodone-acetaminophen (NORCO) 7.5-325 MG tablet TAKE ONE TABLET BY MOUTH EVERY 8 HOURS AS NEEDED FOR PAIN 10/26/16   Tonia Ghent, MD  lansoprazole (PREVACID) 30 MG capsule TAKE ONE CAPSULE BY MOUTH ONCE DAILY 11/25/15   Tonia Ghent, MD  loratadine (CLARITIN) 10 MG tablet TAKE ONE TABLET BY MOUTH ONCE DAILY 11/13/16   Tonia Ghent, MD  LORazepam (ATIVAN) 1 MG tablet TAKE ONE TABLET BY MOUTH TWICE DAILY TO THREE TIMES DAILY FOR  ANXIETY 06/28/16   Tonia Ghent, MD  metoprolol succinate (TOPROL-XL) 50 MG 24 hr tablet Take 1 tablet (50 mg total) by mouth daily. Take with or immediately following a meal. 10/22/16   Wellington Hampshire, MD  montelukast (SINGULAIR) 10 MG tablet TAKE ONE TABLET BY MOUTH AT BEDTIME 06/14/16   Tonia Ghent, MD  Probiotic Product (ALIGN PO) Take 1 tablet by mouth daily.    Historical Provider, MD     Allergies Albuterol; Alendronate sodium; Amoxicillin-pot clavulanate; Atorvastatin; Azithromycin; Ezetimibe-simvastatin; Gabapentin; Ibandronate sodium; Lexapro [escitalopram oxalate]; Risedronate sodium; Rosuvastatin; Sertraline; and Sulfonamide derivatives   Family History  Problem Relation Age of Onset  . Hypertension Mother   . Breast cancer Mother   . Cancer Sister     colon  . Colon cancer Sister   . Hypertension Father   . Aneurysm Father     AAA  . Cancer Daughter     thyroid  . Anxiety disorder Daughter   . Breast cancer Daughter   . Breast cancer Daughter     Social History Social History  Substance Use Topics  . Smoking status: Never Smoker  . Smokeless tobacco: Never Used  . Alcohol use No    Review of Systems  Constitutional:   No fever or chills.  ENT:   No sore throat. No rhinorrhea. Cardiovascular:   No chest pain. Respiratory:   No dyspnea or cough. Gastrointestinal:   Negative for abdominal pain, vomiting and diarrhea.  Genitourinary:   Negative for dysuria or difficulty urinating. Musculoskeletal:   Negative for focal pain or swelling Neurological:   Positive for headaches 10-point ROS otherwise negative.  ____________________________________________   PHYSICAL EXAM:  VITAL SIGNS: ED Triage Vitals  Enc Vitals Group     BP 11/22/16 2339 (!) 143/112     Pulse Rate 11/22/16 2339 (!) 58     Resp 11/22/16 2339 16     Temp 11/22/16 2339 97.5 F (36.4 C)     Temp Source 11/22/16 2339 Oral     SpO2 11/22/16 2339 97 %     Weight 11/22/16 2339 133  lb (60.3 kg)     Height --      Head Circumference --      Peak Flow --      Pain Score 11/22/16 2340 0     Pain Loc --      Pain Edu? --      Excl. in [place]? --     Vital signs reviewed, nursing assessments reviewed.   Constitutional:   Alert and oriented. Well appearing and in no distress. Eyes:   No scleral icterus. No conjunctival pallor. PERRL. EOMI.  No nystagmus. ENT   Head:   Normocephalic and atraumatic.   Nose:   No congestion/rhinnorhea. No septal hematoma   Mouth/Throat:   MMM, no pharyngeal erythema. No peritonsillar mass.  Neck:   No stridor. No SubQ emphysema. No meningismus. Hematological/Lymphatic/Immunilogical:   No cervical lymphadenopathy. Cardiovascular:   Tachycardia heart rate 110. Symmetric bilateral radial and DP pulses.  No murmurs.  Respiratory:   Normal respiratory effort without tachypnea nor retractions. Breath sounds are clear and equal bilaterally. No wheezes/rales/rhonchi. Gastrointestinal:   Soft and nontender. Non distended. There is no CVA tenderness.  No rebound, rigidity, or guarding. Genitourinary:   deferred Musculoskeletal:   Nontender with normal range of motion in all extremities. No joint effusions.  No lower extremity tenderness.  No edema. Neurologic:   Normal speech and language.  CN 2-10 normal. Motor grossly intact. No gross focal neurologic deficits are appreciated.  Skin:    Skin is warm, dry and intact. No rash noted.  No petechiae, purpura, or bullae.  ____________________________________________    LABS (pertinent positives/negatives) (all labs ordered are listed, but only abnormal results are displayed) Labs Reviewed  BASIC METABOLIC PANEL - Abnormal; Notable for the following:       Result Value   Glucose, Bld 110 (*)    BUN 22 (*)    Creatinine, Ser 1.21 (*)    GFR calc non Af Amer 41 (*)    GFR calc Af Amer 48 (*)    All other components within normal limits  CBC WITH DIFFERENTIAL/PLATELET  MAGNESIUM   PHOSPHORUS   ____________________________________________   EKG    ____________________________________________    RADIOLOGY    ____________________________________________   PROCEDURES Procedures  ____________________________________________   INITIAL IMPRESSION / ASSESSMENT AND PLAN / ED COURSE  Pertinent labs & imaging results that were available during my care of the patient were reviewed by me and considered in my medical decision making (see chart for details).  Patient presents essentially asymptomatically complaining of high blood pressure. Also found to be tachycardic. She has a history of this and takes metoprolol XL, managed by cardiology. Tonight it seems decompensated, possibly due to the acute stress reaction from her sinusitis. She denies taking any stimulant decongestants such as phenylephrine or Sudafed.  Due to blood pressure about 200/120, gave her a dose of labetalol which did help quite a bit and bring her systolic down to about [id number]. Give her a dose of oral metoprolol, advised her to increase her metoprolol, and follow-up with Dr. Fletcher Anon.  No evidence at this time of ACS PE dissection or sepsis. Low suspicion for meningitis encephalitis or intracranial hemorrhage or stroke.    Clinical Course    ____________________________________________   FINAL CLINICAL IMPRESSION(S) / ED DIAGNOSES  Final diagnoses:  Hypertension, unspecified type  Tachycardia       Portions of this note were generated with dragon dictation software. Dictation errors may occur despite best attempts at proofreading.    Carrie Mew, MD 11/23/16 [phone]

## 2016-11-23 NOTE — Discharge Instructions (Signed)
Increase your metoprolol to 100mg  daily (2 tablets) to improve your high blood pressure and fast heart rate. Call Dr. Fletcher Anon today for follow up in clinic as soon as possible.

## 2016-11-23 NOTE — ED Notes (Signed)
Pt signature did not transfer from pad to e-signature on computer. Pt verbalized signature for DC since it didn't transfer over.

## 2016-11-26 ENCOUNTER — Other Ambulatory Visit: Payer: Self-pay | Admitting: Family Medicine

## 2016-11-26 ENCOUNTER — Telehealth: Payer: Self-pay | Admitting: Cardiovascular Disease

## 2016-11-26 NOTE — Telephone Encounter (Signed)
Pt reports BP has continued to be elevated even after increasing metoprolol to 50mg  BID as instructed at 12/8 OV. She has been checking BP daily but is unable to provide any readings except today's.  BP 186/105, HR 72 before taking metoprolol. One hour later, BP improved to 158/79. Confirmed she is taking metoprolol as prescribed. Advised pt to continue to monitor BP/HR twice daily after taking medication x 3 days then call w/readings. She understands to call sooner if BP continues to be elevated as before (180s/100s) Reviewed low sodium diet instructions. Pt repeated instructions back to me and is agreeable w/plan.

## 2016-11-26 NOTE — Telephone Encounter (Signed)
Pt c/o BP issue: STAT if pt c/o blurred vision, one-sided weakness or slurred speech  1. What are your last 5 BP readings? 186/105 hr 72 11/26/16 8 am. Patient hasn't been writing them down but has been running around 180's/100's  2. Are you having any other symptoms (ex. Dizziness, headache, blurred vision, passed out)? headache  3. What is your BP issue? Does she need medication change

## 2016-11-27 ENCOUNTER — Telehealth: Payer: Self-pay | Admitting: Family Medicine

## 2016-11-27 NOTE — Telephone Encounter (Signed)
[facility]  Patient Name: Jillian Oconnell  DOB: 1934-04-24    Initial Comment Caller states her BP is high, MD seen last week. BP:187/105, Thursday night hospital visit, and medications are not bringing this down. She fell about 6 weeks ago, she talked with the heart MD nurse yesterday.    Nurse Assessment  Nurse: Wayne Sever, RN, Tillie Rung Date/Time (Eastern Time): 11/27/2016 10:25:20 AM  Confirm and document reason for call. If symptomatic, describe symptoms. ---Caller was seen in the ER last week due to it being so elevated. She saw her heart doctor Friday and they upped her medication and it's still not coming down. Last BP was 158/82, she states that is not bad for her today.  Does the patient have any new or worsening symptoms? ---Yes  Will a triage be completed? ---Yes  Related visit to physician within the last 2 weeks? ---No  Does the PT have any chronic conditions? (i.e. diabetes, asthma, etc.) ---Yes  List chronic conditions. ---Stomach issues, Anxiety, HTN  Is this a behavioral health or substance abuse call? ---No     Guidelines    Guideline Title Affirmed Question Affirmed Notes  High Blood Pressure [1] BP ? 130/80 AND [2] history of heart problems, kidney disease or diabetes    Final Disposition User   See PCP within 2 Deidre Ala, RN, Tillie Rung    Comments  Scheduled with Dr. Damita Dunnings on 12/24 at 12p   Referrals  REFERRED TO PCP OFFICE   Disagree/Comply: Comply

## 2016-11-27 NOTE — Telephone Encounter (Signed)
Left detailed message on voicemail.  

## 2016-11-27 NOTE — Telephone Encounter (Signed)
OV 12/14.  Will see her then.  With last BP reading noted, wouldn't change meds at this point.  Thanks.

## 2016-11-29 ENCOUNTER — Encounter: Payer: Self-pay | Admitting: Family Medicine

## 2016-11-29 ENCOUNTER — Ambulatory Visit (INDEPENDENT_AMBULATORY_CARE_PROVIDER_SITE_OTHER): Payer: Commercial Managed Care - HMO | Admitting: Family Medicine

## 2016-11-29 DIAGNOSIS — I10 Essential (primary) hypertension: Secondary | ICD-10-CM | POA: Diagnosis not present

## 2016-11-29 MED ORDER — METOPROLOL SUCCINATE ER 50 MG PO TB24: 50.0000 mg | ORAL_TABLET | Freq: Two times a day (BID) | ORAL | Status: DC

## 2016-11-29 MED ORDER — DOXYCYCLINE HYCLATE 100 MG PO TABS: 100.0000 mg | ORAL_TABLET | Freq: Two times a day (BID) | ORAL | 0 refills | Status: DC

## 2016-11-29 NOTE — Patient Instructions (Signed)
Stop amoxil, change to doxycycline.  Take an extra 1/2 tab of metoprolol if your BP is >160/>110 and if pulse is still >60.  Take care.  Glad to see you.  Update me as needed.

## 2016-11-29 NOTE — Progress Notes (Signed)
Pre visit review using our clinic review tool, if applicable. No additional management support is needed unless otherwise documented below in the visit note. 

## 2016-11-29 NOTE — Progress Notes (Signed)
Hypertension:    Using medication without problems or lightheadedness: occ lightheaded after standing right after taking metoprolol Chest pain with exertion: no Edema:no Short of breath:no Average home BPs: has been elevated but also down to 114/72 with pulse 74 1 hour after her dose of meds yesterday.   Has been having HA recently, when her BP was elevated.   She is taking metoprolol 50mg  twice a day.   Prev ER course and cardiology note d/w pt.   She has chronic stressors noted, and is worried about her BP in general.    Still with sinus pain in spite of amoxil use.   Meds, vitals, and allergies reviewed.   ROS: Per HPI unless specifically indicated in ROS section   GEN: nad, alert and oriented HEENT: mucous membranes moist, tm wnl, sinuses ttp x4, OP wnl NECK: supple w/o LA CV: rrr. PULM: ctab, no inc wob ABD: soft, +bs EXT: no edema

## 2016-11-30 NOTE — Assessment & Plan Note (Signed)
Discussed with patient. Likely multifactorial. Compliant with medications. Anxiety likely contributes. She still could have an active sinus infection. Discussed with patient about options. Stop amoxicillin, change to doxycycline, use probiotics. Update me if her facial pain does not improve. Continue metoprolol for now but she can take an extra half a tablet per day if needed if she has significantly elevated blood pressures. See after visit summary. She agrees. She will update me.

## 2016-12-18 ENCOUNTER — Telehealth: Payer: Self-pay | Admitting: Family Medicine

## 2016-12-18 ENCOUNTER — Other Ambulatory Visit: Payer: Self-pay | Admitting: Family Medicine

## 2016-12-18 NOTE — Telephone Encounter (Signed)
Patient Name: Jillian Oconnell  DOB: 1934/02/25    Initial Comment Caller states fell 3 months ago,still having headaches *NEEDS 2 MORE ATTEMPTS*   Nurse Assessment  Nurse: Raphael Gibney, RN, [place] Date/Time (Eastern Time): 12/18/2016 12:11:20 PM  Confirm and document reason for call. If symptomatic, describe symptoms. ---Caller states she fell 3 months ago. She has a headache. she had a sinus infection 2-3 weeks ago. Has a lot of sinus drainage. No fever. She was prescribed a sulfa drug which she stopped due to diarrhea. She is taking metoprolol. Headache is sometimes severe.  Does the patient have any new or worsening symptoms? ---Yes  Will a triage be completed? ---Yes  Related visit to physician within the last 2 weeks? ---Yes  Does the PT have any chronic conditions? (i.e. diabetes, asthma, etc.) ---Yes  List chronic conditions. ---HTN; heart problems  Is this a behavioral health or substance abuse call? ---No     Guidelines    Guideline Title Affirmed Question Affirmed Notes  Sinus Pain or Congestion [1] Using nasal washes and pain medicine > 24 hours AND [2] sinus pain (around cheekbone or eye) persists    Final Disposition User   See PCP When Office is Open (within 3 days) Raphael Gibney, RN, Vanita Ingles    Comments  appt scheduled for 12/19/2016 at 3:15 pm with Dr. Renford Dills   Referrals  REFERRED TO PCP OFFICE   Disagree/Comply: Comply

## 2016-12-18 NOTE — Telephone Encounter (Signed)
FYI: patient to be seen tomorrow 12/19/16 by Dr. Damita Dunnings.

## 2016-12-19 ENCOUNTER — Ambulatory Visit (INDEPENDENT_AMBULATORY_CARE_PROVIDER_SITE_OTHER): Payer: Commercial Managed Care - HMO | Admitting: Family Medicine

## 2016-12-19 ENCOUNTER — Encounter: Payer: Self-pay | Admitting: Family Medicine

## 2016-12-19 DIAGNOSIS — J0100 Acute maxillary sinusitis, unspecified: Secondary | ICD-10-CM | POA: Diagnosis not present

## 2016-12-19 MED ORDER — PREDNISONE 5 MG (21) PO TBPK: ORAL_TABLET | ORAL | 0 refills | Status: DC

## 2016-12-19 MED ORDER — AMOXICILLIN 875 MG PO TABS: 875.0000 mg | ORAL_TABLET | Freq: Two times a day (BID) | ORAL | 0 refills | Status: DC

## 2016-12-19 NOTE — Progress Notes (Signed)
HA in the AM but also intermittently later in the day.  "It feel like something is moving around in there."   Noted h/o fall about 3 months ago. Some neck pain at baseline.  Frontal and maxillary pain noted.  This feels like prev sinus infections/URI.  No FCNAVD o/w.  No ST.    She couldn't tolerate sulfa with prev GI side effects.    BP has been variable on home checks, and checking itself was anxiety provoking so she started checking less often.  I support that given the effect of repeated checks at home.     Meds, vitals, and allergies reviewed.   ROS: Per HPI unless specifically indicated in ROS section   GEN: nad, alert and oriented HEENT: mucous membranes moist, tm w/o erythema, nasal exam w/o erythema, clear discharge noted,  OP with cobblestoning, R max and frontal sinus ttp NECK: supple w/o LA CV: rrr.   PULM: ctab, no inc wob EXT: no edema

## 2016-12-19 NOTE — Progress Notes (Signed)
Pre visit review using our clinic review tool, if applicable. No additional management support is needed unless otherwise documented below in the visit note. 

## 2016-12-19 NOTE — Assessment & Plan Note (Signed)
She has had multiple episodes of sinusitis over the years. Discussed with patient. She doesn't have bony tenderness at this point. I don't suspect a facial fracture. Reasonable to use prednisone taper with routine cautions with amoxicillin. If she is not improved in about a week we can get sinus CT done. She agrees. Okay for outpatient follow-up. She will update me as needed.

## 2016-12-19 NOTE — Patient Instructions (Signed)
Start amoxil, take prednisone with food and update me in about 1 week if not better, sooner if worse.  We can set up the CT of your sinuses if not better.  Take care.  Glad to see you.

## 2016-12-27 DIAGNOSIS — H35371 Puckering of macula, right eye: Secondary | ICD-10-CM | POA: Diagnosis not present

## 2017-01-04 ENCOUNTER — Telehealth: Payer: Self-pay

## 2017-01-04 ENCOUNTER — Ambulatory Visit: Payer: Commercial Managed Care - HMO | Admitting: Cardiovascular Disease

## 2017-01-04 DIAGNOSIS — J329 Chronic sinusitis, unspecified: Secondary | ICD-10-CM

## 2017-01-04 NOTE — Telephone Encounter (Signed)
Pt left v/m; pt seen 12/19/16; medication did not help her head and pt request the CT scan to be ordered as discussed at the 12/19/16 appt.

## 2017-01-06 NOTE — Telephone Encounter (Signed)
Ordered. Thanks

## 2017-01-07 NOTE — Telephone Encounter (Signed)
Left message for Ms. Jillian Oconnell that Dr. Damita Dunnings has ordered the CT and Rosaria Ferries or Shirlean Mylar will be in touch with her once they have this scheduled for her.

## 2017-01-18 ENCOUNTER — Ambulatory Visit
Admission: RE | Admit: 2017-01-18 | Discharge: 2017-01-18 | Disposition: A | Discharge status: Home / Self Care | Payer: Medicare HMO | Source: Ambulatory Visit | Attending: Family Medicine | Admitting: Family Medicine

## 2017-01-18 DIAGNOSIS — J329 Chronic sinusitis, unspecified: Secondary | ICD-10-CM | POA: Diagnosis not present

## 2017-01-22 ENCOUNTER — Ambulatory Visit (INDEPENDENT_AMBULATORY_CARE_PROVIDER_SITE_OTHER): Payer: Medicare HMO | Admitting: Cardiovascular Disease

## 2017-01-22 ENCOUNTER — Encounter: Payer: Self-pay | Admitting: Cardiovascular Disease

## 2017-01-22 VITALS — BP 158/100 | HR 100 | Ht 59.0 in | Wt 134.8 lb

## 2017-01-22 DIAGNOSIS — I493 Ventricular premature depolarization: Secondary | ICD-10-CM | POA: Diagnosis not present

## 2017-01-22 DIAGNOSIS — I471 Supraventricular tachycardia, unspecified: Secondary | ICD-10-CM

## 2017-01-22 DIAGNOSIS — I10 Essential (primary) hypertension: Secondary | ICD-10-CM

## 2017-01-22 MED ORDER — METOPROLOL SUCCINATE ER 100 MG PO TB24: 100.0000 mg | ORAL_TABLET | Freq: Every day | ORAL | 5 refills | Status: DC

## 2017-01-22 NOTE — Progress Notes (Signed)
Cardiology Office Note   Date:  01/22/2017   ID:  Jillian Oconnell, DOB 06/28/1934, MRN [id number]  PCP:  Jillian Stain, MD  Cardiologist:   Jillian Sacramento, MD   Chief Complaint  Patient presents with  . other    3 month follow up for elevated BP. Meds reviewed by the pt. verbally. Pt. c/o shortness of breath with over exertion.       History of Present Illness: Jillian Oconnell is a 83 y.o. female who presents for a followup visit regarding palpitations with known history of sinus tachycardia, possible paroxysmal supraventricular tachycardia and frequent  PVCs.  She had previous cardiac catheterization in February of 2009 which showed minor luminal irregularities with no evidence of obstructive disease. Ejection fraction was normal. Palpitations worsened in 04/09/2014 after the death of her husband. She was treated with diltiazem but then switched to a small dose metoprolol. Most recent echocardiogram in 09/2016 showed normal LV systolic function, No significant valvular abnormalities and normal pulmonary pressure.   Most recent Holter monitor in October 2017 showed very frequent PVCs with a total of 34,000 beats representing 25% burden.  PVCs correlate usually with anxiety. She has been having issues with sinusitis as well. During last visit, I increased the dose of Toprol but blood pressure and heart rate continues to be on the high side. She does complain of palpitations but no chest pain or shortness of breath.  Past Medical History:  Diagnosis Date  . Allergy   . Anxiety   . Asthma   . Concussion    with fall at home 10/2016  . Depression   . GERD (gastroesophageal reflux disease)   . Glaucoma   . Hyperlipidemia   . Hypertension   . IBS (irritable bowel syndrome)   . OA (osteoarthritis) of knee    injections  . Osteopenia    DXA 04-09-2010  . Palpitations   . Paroxysmal supraventricular tachycardia ([place]) May of 2005  . 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 Shingles    post herpetic neuralgia (pain clinic in past)    Past Surgical History:  Procedure Laterality Date  . BREAST EXCISIONAL BIOPSY Right 1988   NEG  . CARDIAC CATHETERIZATION  01/2008   65% ;[place]. Minor luminal irregularities with no evidence of obstructive disease.  . cataract surgery    . [place]  . RECTOCELE REPAIR  04/10/03  . TOTAL ABDOMINAL HYSTERECTOMY       Current Outpatient Prescriptions  Medication Sig Dispense Refill  . amoxicillin (AMOXIL) 875 MG tablet Take 1 tablet (875 mg total) by mouth 2 (two) times daily. 20 tablet 0  . Calcium Carbonate-Vitamin D (CALTRATE 600+D) 600-400 MG-UNIT per tablet Take 1 tablet by mouth two times a day    . fluticasone (FLONASE) 50 MCG/ACT nasal spray USE TWO SPRAYS IN EACH NOSTRIL ONCE DAILY 16 g 12  . Fluticasone Propionate, Inhal, (FLOVENT DISKUS) 250 MCG/BLIST AEPB INHALE ONE PUFF BY MOUTH TWICE DAILY *RINSE MOUTH AFTER USE* 60 each 12  . HYDROcodone-acetaminophen (NORCO) 7.5-325 MG tablet TAKE ONE TABLET BY MOUTH EVERY 8 HOURS AS NEEDED FOR PAIN 270 tablet 0  . lansoprazole (PREVACID) 30 MG capsule TAKE ONE CAPSULE BY MOUTH ONCE DAILY 30 capsule 11  . loratadine (CLARITIN) 10 MG tablet TAKE ONE TABLET BY MOUTH ONCE DAILY 90 tablet 3  . LORazepam (ATIVAN) 1 MG tablet TAKE ONE  TABLET BY MOUTH TWICE DAILY TO THREE TIMES DAILY FOR ANXIETY 90 tablet 2  . metoprolol succinate (TOPROL-XL) 50 MG 24 hr tablet Take 1 tablet (50 mg total) by mouth 2 (two) times daily. Take with or immediately following a meal.  Take an extra 1/2 tab if needed.    . montelukast (SINGULAIR) 10 MG tablet TAKE ONE TABLET BY MOUTH AT BEDTIME 30 tablet 5  . predniSONE (STERAPRED UNI-PAK 21 TAB) 5 MG (21) TBPK tablet Take 6 day dose pack as directed.  With food. 21 tablet 0   No current facility-administered medications for this visit.     Allergies:   Albuterol; Alendronate sodium; Amoxicillin-pot clavulanate; Atorvastatin;  Azithromycin; Ezetimibe-simvastatin; Gabapentin; Ibandronate sodium; Lexapro [escitalopram oxalate]; Risedronate sodium; Rosuvastatin; Sertraline; and Sulfonamide derivatives    Social History:  The patient  reports that she has never smoked. She has never used smokeless tobacco. She reports that she does not drink alcohol or use drugs.   Family History:  [name] family history includes Aneurysm in her father; Anxiety disorder in her daughter; Breast cancer in her daughter, daughter, and mother; Cancer in her daughter and sister; Colon cancer in her sister; Hypertension in her father and mother.    ROS:  Please see the history of present illness.   Otherwise, review of systems are positive for none.   All other systems are reviewed and negative.    PHYSICAL EXAM: VS:  BP (!) 158/100 (BP Location: Right Arm, Patient Position: Sitting, Cuff Size: Normal)   Pulse 100   Ht 4\' 11"  (1.499 m)   Wt 134 lb 12 oz (61.1 kg)   BMI 27.22 kg/m  , BMI Body mass index is 27.22 kg/m. GEN: Well nourished, well developed, in no acute distress  HEENT: normal  Neck: no JVD, carotid bruits, or masses Cardiac: RRR with frequent premature beats; no murmurs, rubs, or gallops,no edema  Respiratory:  clear to auscultation bilaterally, normal work of breathing GI: soft, nontender, nondistended, + BS MS: no deformity or atrophy  Skin: warm and dry, no rash Neuro:  Strength and sensation are intact Psych: euthymic mood, full affect   EKG:  EKG is  ordered today. EKG showed sinus tachycardia with frequent PVCs   Recent Labs: 11/23/2016: BUN 22; Creatinine, Ser 1.21; Hemoglobin 15.0; Magnesium 2.0; Platelets 218; Potassium 4.2; Sodium 142    Lipid Panel    Component Value Date/Time   CHOL 243 (H) 01/06/2013 1157   TRIG 186.0 (H) 01/06/2013 1157   HDL 59.40 01/06/2013 1157   CHOLHDL 4 01/06/2013 1157   VLDL 37.2 01/06/2013 1157   LDLCALC 107 (H) 01/05/2010 1003   LDLDIRECT 148.1 01/06/2013 1157       Wt Readings from Last 3 Encounters:  01/22/17 134 lb 12 oz (61.1 kg)  12/19/16 134 lb 4 oz (60.9 kg)  11/29/16 134 lb (60.8 kg)       No flowsheet data found.    ASSESSMENT AND PLAN:  1.  Symptomatic PVCs: This is a long-standing history with no evidence of ischemic or structural heart disease. She continues to have PVCs on her EKG and thus I elected to increase Toprol to 100 mg twice daily.   2. History of sinus tachycardia: Slightly improved with increasing metoprolol.   3. Essential hypertension:  Blood pressure continues to be elevated. Metoprolol was increased as outlined above.   Disposition:   FU with me in 4 months  Signed,  Jillian Sacramento, MD  01/22/2017 2:58 PM  [facility]

## 2017-01-22 NOTE — Patient Instructions (Signed)
Medication Instructions:  Your physician has recommended you make the following change in your medication:  INCREASE metoprolol to 100mg twice daily   Labwork: none  Testing/Procedures: none  Follow-Up: Your physician recommends that you schedule a follow-up appointment in: 4 months with Dr. Arida.    Any Other Special Instructions Will Be Listed Below (If Applicable).     If you need a refill on your cardiac medications before your next appointment, please call your pharmacy.   

## 2017-01-23 ENCOUNTER — Ambulatory Visit (INDEPENDENT_AMBULATORY_CARE_PROVIDER_SITE_OTHER): Payer: Medicare HMO | Admitting: Family Medicine

## 2017-01-23 ENCOUNTER — Ambulatory Visit (INDEPENDENT_AMBULATORY_CARE_PROVIDER_SITE_OTHER)
Admission: RE | Admit: 2017-01-23 | Discharge: 2017-01-23 | Disposition: A | Discharge status: Home / Self Care | Payer: Medicare HMO | Source: Ambulatory Visit | Attending: Family Medicine | Admitting: Family Medicine

## 2017-01-23 ENCOUNTER — Encounter: Payer: Self-pay | Admitting: Family Medicine

## 2017-01-23 VITALS — BP 150/70 | HR 84 | Temp 98.6°F | Wt 135.0 lb

## 2017-01-23 DIAGNOSIS — R51 Headache: Secondary | ICD-10-CM

## 2017-01-23 DIAGNOSIS — M50321 Other cervical disc degeneration at C4-C5 level: Secondary | ICD-10-CM | POA: Diagnosis not present

## 2017-01-23 DIAGNOSIS — R519 Headache, unspecified: Secondary | ICD-10-CM

## 2017-01-23 MED ORDER — METOPROLOL SUCCINATE ER 100 MG PO TB24: 100.0000 mg | ORAL_TABLET | Freq: Two times a day (BID) | ORAL | Status: DC

## 2017-01-23 NOTE — Patient Instructions (Signed)
Try wearing your hair in a ponytail and see if that helps.   Go to the lab on the way out.  We'll contact you with your xray report. We'll go from there.
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
## 2017-01-23 NOTE — Progress Notes (Signed)
Pre visit review using our clinic review tool, if applicable. No additional management support is needed unless otherwise documented below in the visit note. 

## 2017-01-24 DIAGNOSIS — R51 Headache: Secondary | ICD-10-CM

## 2017-01-24 DIAGNOSIS — R519 Headache, unspecified: Secondary | ICD-10-CM | POA: Insufficient documentation

## 2017-01-24 NOTE — Assessment & Plan Note (Signed)
I think she really did have episodes of sinusitis before. She had previously improve prednisone, she had previously improved with antibiotics. Once she had been started on Singulair and Flonase and Flovent her episodes had gotten some better. She does have some pain now, but I think it is likely not all due to sinusitis. CT previously done and discussed with patient. I question if she could have occipital neuralgia radiating up her head. She could have this old neck injury, check plain films today. Another issue is that she wears her hair in a tight bun on the top of her head. She has long hair, and she wears it that way everyday. She does have more headaches at the end of the day. I don't know if the way she wears her hair is causing traction on the scalp that irritates the nerves in her scalp and causes or contributes to some of the headaches. Discussed with patient. I think it is reasonable to put her hair down in a ponytail for a few days and see if this makes any difference. She said she would try that and we will go from there. See notes on imaging. She has no focal neurologic signs and she is okay for outpatient follow-up

## 2017-02-04 DIAGNOSIS — L820 Inflamed seborrheic keratosis: Secondary | ICD-10-CM | POA: Diagnosis not present

## 2017-02-04 DIAGNOSIS — L570 Actinic keratosis: Secondary | ICD-10-CM | POA: Diagnosis not present

## 2017-02-04 DIAGNOSIS — L400 Psoriasis vulgaris: Secondary | ICD-10-CM | POA: Diagnosis not present

## 2017-02-04 DIAGNOSIS — Z85828 Personal history of other malignant neoplasm of skin: Secondary | ICD-10-CM | POA: Diagnosis not present

## 2017-02-18 ENCOUNTER — Telehealth: Payer: Self-pay | Admitting: Family Medicine

## 2017-02-18 ENCOUNTER — Telehealth: Payer: Self-pay | Admitting: Cardiovascular Disease

## 2017-02-18 MED ORDER — METOPROLOL SUCCINATE ER 100 MG PO TB24: 100.0000 mg | ORAL_TABLET | Freq: Two times a day (BID) | ORAL | 3 refills | Status: DC

## 2017-02-18 NOTE — Telephone Encounter (Signed)
Called pt: no vm set up.Time to schedule AWV + labs with Lesia and CPE with PCP. °

## 2017-02-18 NOTE — Telephone Encounter (Signed)
Please advise 

## 2017-02-18 NOTE — Telephone Encounter (Signed)
Updated rx sent to [facility].

## 2017-02-18 NOTE — Telephone Encounter (Signed)
Pt calling stating she is taking 100 mg twice a day  But when we called it in to [facility] on [address]  They only filled it so she takes one a day Please advise

## 2017-02-27 ENCOUNTER — Other Ambulatory Visit: Payer: Self-pay | Admitting: Family Medicine

## 2017-02-27 NOTE — Telephone Encounter (Signed)
Received refill electronically Last refill 06/28/16 #90/2 Last office visit 01/23/17

## 2017-02-28 NOTE — Telephone Encounter (Signed)
Ativan called into [facility].

## 2017-02-28 NOTE — Telephone Encounter (Signed)
Please call in.  Thanks.   

## 2017-03-22 ENCOUNTER — Ambulatory Visit (INDEPENDENT_AMBULATORY_CARE_PROVIDER_SITE_OTHER): Payer: Medicare HMO | Admitting: Family Medicine

## 2017-03-22 ENCOUNTER — Telehealth: Payer: Self-pay | Admitting: Family Medicine

## 2017-03-22 ENCOUNTER — Encounter: Payer: Self-pay | Admitting: Family Medicine

## 2017-03-22 ENCOUNTER — Ambulatory Visit (INDEPENDENT_AMBULATORY_CARE_PROVIDER_SITE_OTHER)
Admission: RE | Admit: 2017-03-22 | Discharge: 2017-03-22 | Disposition: A | Discharge status: Home / Self Care | Payer: Medicare HMO | Source: Ambulatory Visit | Attending: Family Medicine | Admitting: Family Medicine

## 2017-03-22 VITALS — BP 124/90 | HR 94 | Temp 98.6°F | Wt 132.8 lb

## 2017-03-22 DIAGNOSIS — R05 Cough: Secondary | ICD-10-CM

## 2017-03-22 DIAGNOSIS — H9190 Unspecified hearing loss, unspecified ear: Secondary | ICD-10-CM

## 2017-03-22 DIAGNOSIS — R059 Cough, unspecified: Secondary | ICD-10-CM

## 2017-03-22 MED ORDER — PREDNISONE 5 MG (21) PO TBPK: ORAL_TABLET | ORAL | 0 refills | Status: DC

## 2017-03-22 MED ORDER — LEVOFLOXACIN 500 MG PO TABS: 500.0000 mg | ORAL_TABLET | Freq: Every day | ORAL | 0 refills | Status: DC

## 2017-03-22 MED ORDER — AMOXICILLIN 875 MG PO TABS: 875.0000 mg | ORAL_TABLET | Freq: Two times a day (BID) | ORAL | 0 refills | Status: DC

## 2017-03-22 NOTE — Telephone Encounter (Signed)
Pt has appt with Dr Damita Dunnings 03/22/17 at 2:30.

## 2017-03-22 NOTE — Patient Instructions (Signed)
Keep taking plain mucinex and try to get some rest.  Go to the lab on the way out.  We'll contact you with your xray report. Start prednisone, with food.  If worse, then star the amoxil.  Take care.  Glad to see you.  Update [place] as needed.

## 2017-03-22 NOTE — Progress Notes (Signed)
Pre visit review using our clinic review tool, if applicable. No additional management support is needed unless otherwise documented below in the visit note. 

## 2017-03-22 NOTE — Telephone Encounter (Signed)
Patient Name: Jillian Oconnell  DOB: [birth year]    Initial Comment caller states she has a cough, nausea and is weak   Nurse Assessment  Nurse: Verlin Fester, RN, Stanton Kidney Date/Time (Eastern Time): 03/22/2017 7:40:11 AM  Confirm and document reason for call. If symptomatic, describe symptoms. ---Patient states she has a runny nose, cough, wheezing, nausea and is weak  Does the patient have any new or worsening symptoms? ---Yes  Will a triage be completed? ---Yes  Related visit to physician within the last 2 weeks? ---No  Does the PT have any chronic conditions? (i.e. diabetes, asthma, etc.) ---Yes  List chronic conditions. ---"asthma, heart irregularity, HTN  Is this a behavioral health or substance abuse call? ---No     Guidelines    Guideline Title Affirmed Question Affirmed Notes  Asthma Attack [1] Severe wheezing or coughing AND [2] doesn't have neb or inhaler available    Final Disposition User   Go to ED Now Verlin Fester, RN, Stanton Kidney    Comments  After triage patient refused to go to ED and states she wants to see Dr. Damita Dunnings   Referrals  REFERRED TO PCP OFFICE   Disagree/Comply: Disagree  Disagree/Comply Reason: Disagree with instructions   Appt scheduled per office pending nurse arrival to notify her of ED outcome refusal

## 2017-03-22 NOTE — Progress Notes (Signed)
Per prev HA got better.  She was back to baseline, then had no symptoms listed below.  She has persistent R>L sided rhinorrhea at baseline.  New sx started a few days ago.  First noted chest felt tighter than normal.  Then cough.  Some sputum.  Some wheeze.  Still on baseline inhalers.  Sputum is greenish.  No fevers.  No vomiting but some nausea.  No swelling.  Some abd discomfort prev but not diarrhea.  No abd pain now.  Some mild HA noted, different from the prev HA that resolved.    Family member with a cough.    HOH, wanted audiology eval.  Referred.    PMH and SH reviewed  ROS: Per HPI unless specifically indicated in ROS section   Meds, vitals, and allergies reviewed.   GEN: nad, alert and oriented HEENT: mucous membranes moist, tm w/o erythema, nasal exam w/o erythema, clear discharge noted,  OP with cobblestoning, right maxillary sinus tender to palpation NECK: supple w/o LA CV: rrr.   PULM: ctab, no inc wob EXT: no edema SKIN: no acute rash Heart of hearing at baseline

## 2017-03-22 NOTE — Telephone Encounter (Signed)
Patient's daughter,Vicki,returned Shapale's call.  I notified [name] of Dr.Duncan's comments and she voiced understanding.

## 2017-03-24 ENCOUNTER — Encounter: Payer: Self-pay | Admitting: Family Medicine

## 2017-03-24 DIAGNOSIS — R05 Cough: Secondary | ICD-10-CM | POA: Insufficient documentation

## 2017-03-24 DIAGNOSIS — H9190 Unspecified hearing loss, unspecified ear: Secondary | ICD-10-CM | POA: Insufficient documentation

## 2017-03-24 DIAGNOSIS — R059 Cough, unspecified: Secondary | ICD-10-CM | POA: Insufficient documentation

## 2017-03-24 NOTE — Assessment & Plan Note (Signed)
Refer

## 2017-03-24 NOTE — Assessment & Plan Note (Signed)
Check chest x-ray. Reviewed. The concern was for pneumonia. Start Levaquin. The initial plan was for her to hold amoxicillin but we will cancel that. Patient notified. See notes on imaging. Start prednisone taper due to history of wheeze. She is not wheezing at time of exam and still is okay for outpatient follow-up. All questions answered.

## 2017-03-27 ENCOUNTER — Other Ambulatory Visit: Payer: Self-pay | Admitting: *Deleted

## 2017-03-27 NOTE — Telephone Encounter (Signed)
Last OV 03/2017-acute. Last Rx 10/26/2016 #270. pls advise

## 2017-03-28 ENCOUNTER — Telehealth: Payer: Self-pay | Admitting: *Deleted

## 2017-03-28 MED ORDER — HYDROCODONE-ACETAMINOPHEN 7.5-325 MG PO TABS: ORAL_TABLET | ORAL | 0 refills | Status: DC

## 2017-03-28 MED ORDER — LEVOFLOXACIN 500 MG PO TABS: 500.0000 mg | ORAL_TABLET | Freq: Every day | ORAL | 0 refills | Status: DC

## 2017-03-28 NOTE — Telephone Encounter (Signed)
Patient advised.  Rx left at front desk for pick up. 

## 2017-03-28 NOTE — Telephone Encounter (Signed)
No fevers, creamy-yellow mucous.

## 2017-03-28 NOTE — Telephone Encounter (Signed)
Patient states she is "still sick".  She has finished the Prednisone and has one more tablet of ABX.  She has been taking Mucinex but she says she is still so full of mucous.  I asked if there had been any improvement and she said, yes, better than she was, but still very congested.  Please advise.

## 2017-03-28 NOTE — Telephone Encounter (Signed)
The prednisone may continue to have some effect for a few more days.  I wouldn't restart that yet.  Is the mucous discolored? Any fevers?  Let me know.  Thanks.

## 2017-03-28 NOTE — Telephone Encounter (Signed)
Printed.  Thanks.  

## 2017-03-28 NOTE — Telephone Encounter (Signed)
Extend the abx for 3 more days, rx sent.  Thanks.

## 2017-03-28 NOTE — Addendum Note (Signed)
Addended by: Tonia Ghent on: 03/28/2017 10:58 PM   Modules accepted: Orders

## 2017-03-29 NOTE — Telephone Encounter (Signed)
Patient advised.

## 2017-04-02 ENCOUNTER — Other Ambulatory Visit: Payer: Self-pay | Admitting: Family Medicine

## 2017-04-02 DIAGNOSIS — Z1231 Encounter for screening mammogram for malignant neoplasm of breast: Secondary | ICD-10-CM

## 2017-04-09 NOTE — Telephone Encounter (Signed)
Spoke with household member. Pt was in shower and will have her call me back to schedule AWV

## 2017-04-09 NOTE — Telephone Encounter (Signed)
Scheduled 04/18/17 °

## 2017-04-18 ENCOUNTER — Ambulatory Visit: Payer: Medicare HMO

## 2017-04-23 ENCOUNTER — Other Ambulatory Visit: Payer: Self-pay | Admitting: Family Medicine

## 2017-04-23 ENCOUNTER — Ambulatory Visit (INDEPENDENT_AMBULATORY_CARE_PROVIDER_SITE_OTHER): Payer: Medicare HMO

## 2017-04-23 VITALS — BP 122/84 | HR 73 | Temp 97.6°F | Ht <= 58 in | Wt 131.5 lb

## 2017-04-23 DIAGNOSIS — M810 Age-related osteoporosis without current pathological fracture: Secondary | ICD-10-CM | POA: Diagnosis not present

## 2017-04-23 DIAGNOSIS — Z0000 Encounter for general adult medical examination without abnormal findings: Secondary | ICD-10-CM

## 2017-04-23 LAB — COMPREHENSIVE METABOLIC PANEL
ALBUMIN: 3.9 g/dL (ref 3.5–5.2)
ALT: 10 U/L (ref 0–35)
AST: 20 U/L (ref 0–37)
Alkaline Phosphatase: 50 U/L (ref 39–117)
BILIRUBIN TOTAL: 0.5 mg/dL (ref 0.2–1.2)
BUN: 15 mg/dL (ref 6–23)
CALCIUM: 9.7 mg/dL (ref 8.4–10.5)
CO2: 35 meq/L — AB (ref 19–32)
CREATININE: 1.03 mg/dL (ref 0.40–1.20)
Chloride: 101 mEq/L (ref 96–112)
GFR: 54.74 mL/min — ABNORMAL LOW (ref >60.00)
GLUCOSE: 86 mg/dL (ref 70–99)
Potassium: 4.3 mEq/L (ref 3.5–5.1)
Sodium: 141 mEq/L (ref 135–145)
Total Protein: 7.1 g/dL (ref 6.0–8.3)

## 2017-04-23 LAB — VITAMIN D 25 HYDROXY (VIT D DEFICIENCY, FRACTURES): VITD: 66.4 ng/mL (ref 30.00–100.00)

## 2017-04-23 NOTE — Progress Notes (Signed)
Pre visit review using our clinic review tool, if applicable. No additional management support is needed unless otherwise documented below in the visit note. 

## 2017-04-23 NOTE — Progress Notes (Signed)
PCP notes:   Health maintenance:  Mammogram - per pt, future appt scheduled  Abnormal screenings:   Hearing -failed  Patient concerns:   Pt reports poor appetite. Additionally, pt states several foods cause gas and bloating. Pt was provided a comprehensive list of foods that do not produce gas from the Anti-Flatulence diet plan.   Nurse concerns:  None  Next PCP appt:   04/26/17 @ 1215  I reviewed health advisor's note, was available for consultation on the day of service listed in this note, and agree with documentation and plan. Elsie Stain, MD.

## 2017-04-23 NOTE — Patient Instructions (Signed)
Ms. Biever , Thank you for taking time to come for your Medicare Wellness Visit. I appreciate your ongoing commitment to your health goals. Please review the following plan we discussed and let me know if I can assist you in the future.   These are the goals we discussed: Goals    . diet management          Starting 04/23/2017, I will attempt to incorporate more non gas-producing food choices within my diet.        This is a list of the screening recommended for you and due dates:  Health Maintenance  Topic Date Due  . Mammogram  05/10/2017  . Flu Shot  07/17/2017  . Tetanus Vaccine  05/02/2022  . DEXA scan (bone density measurement)  Completed  . Pneumonia vaccines  Completed   Preventive Care for Adults  A healthy lifestyle and preventive care can promote health and wellness. Preventive health guidelines for adults include the following key practices.  . A routine yearly physical is a good way to check with your health care provider about your health and preventive screening. It is a chance to share any concerns and updates on your health and to receive a thorough exam.  . Visit your dentist for a routine exam and preventive care every 6 months. Brush your teeth twice a day and floss once a day. Good oral hygiene prevents tooth decay and gum disease.  . The frequency of eye exams is based on your age, health, family medical history, use  of contact lenses, and other factors. Follow your health care provider's ecommendations for frequency of eye exams.  . Eat a healthy diet. Foods like vegetables, fruits, whole grains, low-fat dairy products, and lean protein foods contain the nutrients you need without too many calories. Decrease your intake of foods high in solid fats, added sugars, and salt. Eat the right amount of calories for you. Get information about a proper diet from your health care provider, if necessary.  . Regular physical exercise is one of the most important things you  can do for your health. Most adults should get at least 150 minutes of moderate-intensity exercise (any activity that increases your heart rate and causes you to sweat) each week. In addition, most adults need muscle-strengthening exercises on 2 or more days a week.  Silver Sneakers may be a benefit available to you. To determine eligibility, you may visit the website: www.silversneakers.com or contact program at 571-590-5991 Mon-Fri between 8AM-8PM.   . Maintain a healthy weight. The body mass index (BMI) is a screening tool to identify possible weight problems. It provides an estimate of body fat based on height and weight. Your health care provider can find your BMI and can help you achieve or maintain a healthy weight.   For adults 20 years and older: ? A BMI below 18.5 is considered underweight. ? A BMI of 18.5 to 24.9 is normal. ? A BMI of 25 to 29.9 is considered overweight. ? A BMI of 30 and above is considered obese.   . Maintain normal blood lipids and cholesterol levels by exercising and minimizing your intake of saturated fat. Eat a balanced diet with plenty of fruit and vegetables. Blood tests for lipids and cholesterol should begin at age 33 and be repeated every 5 years. If your lipid or cholesterol levels are high, you are over 50, or you are at high risk for heart disease, you may need your cholesterol levels checked more frequently.  Ongoing high lipid and cholesterol levels should be treated with medicines if diet and exercise are not working.  . If you smoke, find out from your health care provider how to quit. If you do not use tobacco, please do not start.  . If you choose to drink alcohol, please do not consume more than 2 drinks per day. One drink is considered to be 12 ounces (355 mL) of beer, 5 ounces (148 mL) of wine, or 1.5 ounces (44 mL) of liquor.  . If you are 64-43 years old, ask your health care provider if you should take aspirin to prevent strokes.  . Use  sunscreen. Apply sunscreen liberally and repeatedly throughout the day. You should seek shade when your shadow is shorter than you. Protect yourself by wearing long sleeves, pants, a wide-brimmed hat, and sunglasses year round, whenever you are outdoors.  . Once a month, do a whole body skin exam, using a mirror to look at the skin on your back. Tell your health care provider of new moles, moles that have irregular borders, moles that are larger than a pencil eraser, or moles that have changed in shape or color.

## 2017-04-23 NOTE — Progress Notes (Signed)
Subjective:   Jillian Oconnell is a 83 y.o. female who presents for an Initial Medicare Annual Wellness Visit.  Review of Systems    N/A  Cardiac Risk Factors include: advanced age (>33men, >84 women);hypertension     Objective:    Today's Vitals   04/23/17 1344  BP: 122/84  Pulse: 73  Temp: 97.6 F (36.4 C)  TempSrc: Oral  SpO2: 97%  Weight: 131 lb 8 oz (59.6 kg)  Height: 4\' 9"  (1.448 m)  PainSc: 0-No pain   Body mass index is 28.46 kg/m.   Current Medications (verified) Outpatient Encounter Prescriptions as of 04/23/2017  Medication Sig  . Calcium Carbonate-Vitamin D (CALTRATE 600+D) 600-400 MG-UNIT per tablet Take 1 tablet by mouth two times a day  . fluticasone (FLONASE) 50 MCG/ACT nasal spray USE TWO SPRAYS IN EACH NOSTRIL ONCE DAILY  . Fluticasone Propionate, Inhal, (FLOVENT DISKUS) 250 MCG/BLIST AEPB INHALE ONE PUFF BY MOUTH TWICE DAILY *RINSE MOUTH AFTER USE*  . HYDROcodone-acetaminophen (NORCO) 7.5-325 MG tablet TAKE ONE TABLET BY MOUTH EVERY 8 HOURS AS NEEDED FOR PAIN  . lansoprazole (PREVACID) 30 MG capsule TAKE ONE CAPSULE BY MOUTH ONCE DAILY  . loratadine (CLARITIN) 10 MG tablet TAKE ONE TABLET BY MOUTH ONCE DAILY  . LORazepam (ATIVAN) 1 MG tablet TAKE ONE TABLET BY MOUTH TWO TO THREE TIMES DAILY FOR ANXIETY  . metoprolol succinate (TOPROL XL) 100 MG 24 hr tablet Take 1 tablet (100 mg total) by mouth 2 (two) times daily. Take with or immediately following a meal.  . montelukast (SINGULAIR) 10 MG tablet TAKE ONE TABLET BY MOUTH AT BEDTIME  . [DISCONTINUED] levofloxacin (LEVAQUIN) 500 MG tablet Take 1 tablet (500 mg total) by mouth daily.  . [DISCONTINUED] predniSONE (STERAPRED UNI-PAK 21 TAB) 5 MG (21) TBPK tablet Take 6 day dose pack as directed.  With food.   No facility-administered encounter medications on file as of 04/23/2017.     Allergies (verified) Albuterol; Alendronate sodium; Amoxicillin-pot clavulanate; Atorvastatin; Azithromycin;  Ezetimibe-simvastatin; Gabapentin; Ibandronate sodium; Lexapro [escitalopram oxalate]; Risedronate sodium; Rosuvastatin; Sertraline; and Sulfonamide derivatives   History: Past Medical History:  Diagnosis Date  . Allergy   . Anxiety   . Asthma   . Concussion    with fall at home 10/2016  . Depression   . GERD (gastroesophageal reflux disease)   . Glaucoma   . Hyperlipidemia   . Hypertension   . IBS (irritable bowel syndrome)   . OA (osteoarthritis) of knee    injections  . Osteopenia    DXA 2011  . Palpitations   . Paroxysmal supraventricular tachycardia ([place]) May of 2005  . 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 Shingles    post herpetic neuralgia (pain clinic in past)   Past Surgical History:  Procedure Laterality Date  . BREAST EXCISIONAL BIOPSY Right 1988   NEG  . CARDIAC CATHETERIZATION  01/2008   65% ;[place]. Minor luminal irregularities with no evidence of obstructive disease.  . cataract surgery    . [place]  . RECTOCELE REPAIR  2004  . TOTAL ABDOMINAL HYSTERECTOMY     Family History  Problem Relation Age of Onset  . Hypertension Mother   . Breast cancer Mother   . Cancer Sister     colon  . Colon cancer Sister   . Hypertension Father   . Aneurysm Father     AAA  . Cancer Daughter  thyroid  . Anxiety disorder Daughter   . Breast cancer Daughter   . Breast cancer Daughter    Social History   Occupational History  . Retired Retired    Agricultural engineer   Social History Main Topics  . Smoking status: Never Smoker  . Smokeless tobacco: Never Used  . Alcohol use No  . Drug use: No  . Sexual activity: Not Currently    Birth control/ protection: Post-menopausal    Tobacco Counseling Counseling given: No   Activities of Daily Living In your present state of health, do you have any difficulty performing the following activities: 04/23/2017  Hearing? Y  Vision? N  Difficulty concentrating or making  decisions? N  Walking or climbing stairs? Y  Dressing or bathing? N  Doing errands, shopping? N  Preparing Food and eating ? N  Using the Toilet? N  In the past six months, have you accidently leaked urine? N  Do you have problems with loss of bowel control? N  Managing your Medications? N  Managing your Finances? N  Housekeeping or managing your Housekeeping? N  Some recent data might be hidden    Immunizations and Health Maintenance Immunization History  Administered Date(s) Administered  . Influenza Split 11/18/2012  . Influenza Whole 10/03/2009, 09/29/2010  . Influenza,inj,Quad PF,36+ Mos 09/18/2013, 10/08/2014, 10/22/2016  . Influenza-Unspecified 10/21/2015  . Pneumococcal Conjugate-13 07/01/2015  . Pneumococcal Polysaccharide-23 12/17/2004  . Td 05/02/2012   There are no preventive care reminders to display for this patient.  Patient Care Team: Tonia Ghent, MD as PCP - General (Family Medicine)    Assessment:   This is a routine wellness examination for [place].   Hearing/Vision screen  Hearing Screening   125Hz  250Hz  500Hz  1000Hz  2000Hz  3000Hz  4000Hz  6000Hz  8000Hz   Right ear:   0 0 0  0    Left ear:   0 0 40  0    Vision Screening Comments: Last vision in Jan 2018 with [facility]  Dietary issues and exercise activities discussed: Current Exercise Habits: The patient does not participate in regular exercise at present, Exercise limited by: None identified  Goals    . diet management          Starting 04/23/2017, I will attempt to incorporate more non gas-producing food choices within my diet.       Depression Screen PHQ 2/9 Scores 04/23/2017 02/21/2016  PHQ - 2 Score 0 2    Fall Risk Fall Risk  04/23/2017 02/21/2016  Falls in the past year? Yes No  Number falls in past yr: 1 -  Injury with Fall? Yes -    Cognitive Function: MMSE - Mini Mental State Exam 04/23/2017  Orientation to time 5  Orientation to Place 5  Registration 3    Attention/ Calculation 0  Recall 3  Language- name 2 objects 0  Language- repeat 1  Language- follow 3 step command 3  Language- read & follow direction 0  Write a sentence 0  Copy design 0  Total score 20     PLEASE NOTE: A Mini-Cog screen was completed. Maximum score is 20. A value of 0 denotes this part of [place] MMSE was not completed or the patient failed this part of the Mini-Cog screening.   Mini-Cog Screening Orientation to Time - Max 5 pts Orientation to Place - Max 5 pts Registration - Max 3 pts Recall - Max 3 pts Language Repeat - Max 1 pts Language Follow 3 Step Command -  Max 3 pts     Screening Tests Health Maintenance  Topic Date Due  . MAMMOGRAM  05/10/2017  . INFLUENZA VACCINE  07/17/2017  . TETANUS/TDAP  05/02/2022  . DEXA SCAN  Completed  . PNA vac Low Risk Adult  Completed      Plan:     I have personally reviewed and addressed the Medicare Annual Wellness questionnaire and have noted the following in the patient's chart:  A. Medical and social history B. Use of alcohol, tobacco or illicit drugs  C. Current medications and supplements D. Functional ability and status E.  Nutritional status F.  Physical activity G. Advance directives H. List of other physicians I.  Hospitalizations, surgeries, and ER visits in previous 12 months J.  [place] to include hearing, vision, cognitive, depression L. Referrals and appointments - none  In addition, I have reviewed and discussed with patient certain preventive protocols, quality metrics, and best practice recommendations. A written personalized care plan for preventive services as well as general preventive health recommendations were provided to patient.  See attached scanned questionnaire for additional information.   Signed,   Lindell Noe, MHA, BS, LPN Health Coach

## 2017-04-24 DIAGNOSIS — H903 Sensorineural hearing loss, bilateral: Secondary | ICD-10-CM | POA: Diagnosis not present

## 2017-04-24 DIAGNOSIS — H9319 Tinnitus, unspecified ear: Secondary | ICD-10-CM | POA: Diagnosis not present

## 2017-04-26 ENCOUNTER — Ambulatory Visit (INDEPENDENT_AMBULATORY_CARE_PROVIDER_SITE_OTHER): Payer: Medicare HMO | Admitting: Family Medicine

## 2017-04-26 ENCOUNTER — Encounter: Payer: Self-pay | Admitting: Family Medicine

## 2017-04-26 VITALS — BP 140/90 | HR 68 | Temp 98.4°F | Ht <= 58 in | Wt 132.0 lb

## 2017-04-26 DIAGNOSIS — B0229 Other postherpetic nervous system involvement: Secondary | ICD-10-CM | POA: Diagnosis not present

## 2017-04-26 DIAGNOSIS — Z1239 Encounter for other screening for malignant neoplasm of breast: Secondary | ICD-10-CM

## 2017-04-26 DIAGNOSIS — I10 Essential (primary) hypertension: Secondary | ICD-10-CM

## 2017-04-26 DIAGNOSIS — M810 Age-related osteoporosis without current pathological fracture: Secondary | ICD-10-CM | POA: Diagnosis not present

## 2017-04-26 DIAGNOSIS — Z7189 Other specified counseling: Secondary | ICD-10-CM

## 2017-04-26 DIAGNOSIS — F411 Generalized anxiety disorder: Secondary | ICD-10-CM | POA: Diagnosis not present

## 2017-04-26 DIAGNOSIS — E785 Hyperlipidemia, unspecified: Secondary | ICD-10-CM

## 2017-04-26 DIAGNOSIS — J4530 Mild persistent asthma, uncomplicated: Secondary | ICD-10-CM

## 2017-04-26 MED ORDER — HYDROCODONE-ACETAMINOPHEN 7.5-325 MG PO TABS: ORAL_TABLET | ORAL | 0 refills | Status: DC

## 2017-04-26 MED ORDER — FLUTICASONE PROPIONATE (INHAL) 250 MCG/BLIST IN AEPB: INHALATION_SPRAY | RESPIRATORY_TRACT | 12 refills | Status: DC

## 2017-04-26 MED ORDER — FLUTICASONE PROPIONATE 50 MCG/ACT NA SUSP: NASAL | 12 refills | Status: DC

## 2017-04-26 NOTE — Patient Instructions (Addendum)
Try the OTC corn/callus remover.  That may be the most comfortable option in the meantime.   Try the higher dose of flovent during allergy season if needed.   Check with your insurance to see if they will cover the shingles shot.  Take care.  Glad to see you.  Update me as needed.

## 2017-04-26 NOTE — Progress Notes (Signed)
Her headaches have resolved in the meantime.    Mammogram - d/w pt, future appt scheduled Hearing -failed, she has seen ENT in the meantime.  She is going to f/u with them about possibly getting hearing aids.   She has dental clinic f/u pending.    Daughter Jillian Oconnell designated if patient were incapacitated.   If patient had correctable issue, then she would accept ventilator, etc, to allow time to improve. She wouldn't want long term interventions.   If she can't get better, she desires to be comfortable.    Patient concerns:  Pt reports poor appetite. Additionally, pt states several foods cause gas and bloating. Pt was prev provided a comprehensive list of foods that do not produce gas from the Anti-Flatulence diet plan. Gas-X helped in the meantime.    DXA 2017. D/w pt about osteoporosis and prev tx options.  D/w pt about possible tx with prolia.  Vit D prev up to normal.    She'll consider and update me.     Asthma.  D/w pt about using higher dose of flovent when in high allergy season.  She is trying to avoid SABA use if at all possible.    History of significant anxiety related to or exacerbated by the illness and/or death of family members. No adverse effect on benzodiazepine. Not sedated. Noted effect from medication.  h/o shingles pain.  Has been taking hydrocodone w/o ADE in the meantime.  She got her rx filled at [facility] but pharmacy could only rx the 1 month supply.  She is able take BID instead of TID on some days with less pain.  No sedation on med.    Calluses on the feet. L>R distal foot.   Hypertension:    Using medication without problems or lightheadedness: yes Chest pain with exertion:no Edema:no Short of breath:not except related to asthma.   Statin intolerant, d/w pt.    PMH and SH reviewed  ROS: Per HPI unless specifically indicated in ROS section   Meds, vitals, and allergies reviewed.   GEN: nad, alert and oriented HEENT: mucous membranes  moist NECK: supple w/o LA CV: rrr. PULM: ctab, no inc wob ABD: soft, +bs EXT: no edema SKIN: no acute rash L thorax dermatomal change in sensation noted.   She still has change in sensation on R side of face chronically since prev fall.   Calluses on the feet. L>R distal foot.

## 2017-04-28 DIAGNOSIS — Z7189 Other specified counseling: Secondary | ICD-10-CM | POA: Insufficient documentation

## 2017-04-28 NOTE — Assessment & Plan Note (Signed)
Mammogram pending

## 2017-04-28 NOTE — Assessment & Plan Note (Signed)
Daughter [name] designated if patient were incapacitated.   If patient had correctable issue, then she would accept ventilator, etc, to allow time to improve. She wouldn't want long term interventions.   If she can't get better, she desires to be comfortable.

## 2017-04-28 NOTE — Assessment & Plan Note (Signed)
Statin intolerant. Continue work on diet and exercise as much as possible.

## 2017-04-28 NOTE — Assessment & Plan Note (Signed)
Still with significant pain, status post pain clinic eval. No adverse effect on current medications. Some relief from pain with current use of hydrocodone. No adverse effect on medication. Continue as is.

## 2017-04-28 NOTE — Assessment & Plan Note (Signed)
Again discussed with patient about options. Prolia may be the best/next option for patient. Discussed with her about vitamin D. She will consider options and let me know if she wants to proceed.

## 2017-04-28 NOTE — Assessment & Plan Note (Signed)
She clearly has more sx seasonally, but isn't wheezing now.  D/w pt about using higher dose of flovent during those times of the year. She can do 2 puffs in the morning and 1 puff in the evening. She agrees. She will update me as needed.

## 2017-04-28 NOTE — Assessment & Plan Note (Signed)
This continues. She's had significant stressors with the death of her husband and multiple family members who were ill, she's had the stressors over the last few years. She is tolerating current medication without adverse effect or sedation. Given severity of her anxiety it is likely reasonable to continue. Discussed with patient again today.

## 2017-04-28 NOTE — Assessment & Plan Note (Signed)
Controlled. Continue as is. Labs discussed with patient. Statin intolerant.

## 2017-05-14 ENCOUNTER — Ambulatory Visit
Admission: RE | Admit: 2017-05-14 | Discharge: 2017-05-14 | Disposition: A | Discharge status: Home / Self Care | Payer: Medicare HMO | Source: Ambulatory Visit | Attending: Family Medicine | Admitting: Family Medicine

## 2017-05-14 DIAGNOSIS — Z1231 Encounter for screening mammogram for malignant neoplasm of breast: Secondary | ICD-10-CM | POA: Diagnosis not present

## 2017-05-23 ENCOUNTER — Encounter: Payer: Self-pay | Admitting: Cardiovascular Disease

## 2017-05-23 ENCOUNTER — Ambulatory Visit (INDEPENDENT_AMBULATORY_CARE_PROVIDER_SITE_OTHER): Payer: Medicare HMO | Admitting: Cardiovascular Disease

## 2017-05-23 VITALS — BP 140/68 | HR 73 | Ht <= 58 in | Wt 133.0 lb

## 2017-05-23 DIAGNOSIS — I10 Essential (primary) hypertension: Secondary | ICD-10-CM | POA: Diagnosis not present

## 2017-05-23 DIAGNOSIS — I493 Ventricular premature depolarization: Secondary | ICD-10-CM

## 2017-05-23 NOTE — Patient Instructions (Signed)
Medication Instructions: Continue same medications.   Labwork: None.   Procedures/Testing: None.   Follow-Up: 6 months with Dr. Gwen Sarvis.   Any Additional Special Instructions Will Be Listed Below (If Applicable).     If you need a refill on your cardiac medications before your next appointment, please call your pharmacy.   

## 2017-05-23 NOTE — Progress Notes (Signed)
Cardiology Office Note   Date:  05/23/2017   ID:  Jillian Oconnell, DOB [birth year], MRN [id number]  PCP:  Tonia Ghent, MD  Cardiologist:   Kathlyn Sacramento, MD   Chief Complaint  Patient presents with  . other    4 month follow up. Patient deneis chest pain. Meds reviewed verbally with patient.       History of Present Illness: Jillian Oconnell is a 83 y.o. female who presents for a followup visit regarding palpitations with known history of sinus tachycardia, possible paroxysmal supraventricular tachycardia and frequent  PVCs.  She had previous cardiac catheterization in February of 2009 which showed minor luminal irregularities with no evidence of obstructive disease. Ejection fraction was normal. Most recent echocardiogram in 09/2016 showed normal LV systolic function, No significant valvular abnormalities and normal pulmonary pressure.   Most recent Holter monitor in October 2017 showed very frequent PVCs with a total of 34,000 beats representing 25% burden.   The dose of Toprol was increased to 100 mg twice daily with significant improvement in symptoms. She is doing much better and denies any chest pain or worsening dyspnea. Palpitations improved.  Past Medical History:  Diagnosis Date  . Allergy   . Anxiety   . Asthma   . Concussion    with fall at home 10/2016  . Depression   . GERD (gastroesophageal reflux disease)   . Glaucoma   . Hyperlipidemia   . Hypertension   . IBS (irritable bowel syndrome)   . OA (osteoarthritis) of knee    injections  . Osteopenia    DXA 2011  . Palpitations   . Paroxysmal supraventricular tachycardia ([place]) May of 2005  . 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 Shingles    post herpetic neuralgia (pain clinic in past)    Past Surgical History:  Procedure Laterality Date  . BREAST EXCISIONAL BIOPSY Right 1988   NEG  . CARDIAC CATHETERIZATION  01/2008   65% ;[place]. Minor luminal irregularities with no  evidence of obstructive disease.  . cataract surgery    . [place]  . RECTOCELE REPAIR  2004  . TOTAL ABDOMINAL HYSTERECTOMY       Current Outpatient Prescriptions  Medication Sig Dispense Refill  . Calcium Carbonate-Vitamin D (CALTRATE 600+D) 600-400 MG-UNIT per tablet Take 1 tablet by mouth two times a day    . fluticasone (FLONASE) 50 MCG/ACT nasal spray USE TWO SPRAYS IN EACH NOSTRIL ONCE DAILY 16 g 12  . Fluticasone Propionate, Inhal, (FLOVENT DISKUS) 250 MCG/BLIST AEPB INHALE 1-2 PUFFS BY MOUTH IN THE AM, 1 PUFF IN THE PM, RINSE AFTER USE 90 each 12  . HYDROcodone-acetaminophen (NORCO) 7.5-325 MG tablet TAKE ONE TABLET BY MOUTH EVERY 8 HOURS AS NEEDED FOR PAIN 90 tablet 0  . lansoprazole (PREVACID) 30 MG capsule TAKE ONE CAPSULE BY MOUTH ONCE DAILY 30 capsule 11  . loratadine (CLARITIN) 10 MG tablet TAKE ONE TABLET BY MOUTH ONCE DAILY 90 tablet 3  . LORazepam (ATIVAN) 1 MG tablet TAKE ONE TABLET BY MOUTH TWO TO THREE TIMES DAILY FOR ANXIETY 90 tablet 2  . metoprolol succinate (TOPROL XL) 100 MG 24 hr tablet Take 1 tablet (100 mg total) by mouth 2 (two) times daily. Take with or immediately following a meal. 180 tablet 3  . montelukast (SINGULAIR) 10 MG tablet TAKE ONE TABLET BY MOUTH AT BEDTIME 30 tablet 5  No current facility-administered medications for this visit.     Allergies:   Albuterol; Alendronate sodium; Amoxicillin-pot clavulanate; Atorvastatin; Azithromycin; Ezetimibe-simvastatin; Gabapentin; Ibandronate sodium; Lexapro [escitalopram oxalate]; Risedronate sodium; Rosuvastatin; Sertraline; and Sulfonamide derivatives    Social History:  The patient  reports that she has never smoked. She has never used smokeless tobacco. She reports that she does not drink alcohol or use drugs.   Family History:  [name] family history includes Aneurysm in her father; Anxiety disorder in her daughter; Breast cancer in her daughter; Breast cancer (age of onset: 73)  in her daughter; Breast cancer (age of onset: 25) in her mother; Cancer in her daughter and sister; Colon cancer in her sister; Hypertension in her father and mother.    ROS:  Please see the history of present illness.   Otherwise, review of systems are positive for none.   All other systems are reviewed and negative.    PHYSICAL EXAM: VS:  BP 140/68 (BP Location: Left Arm, Patient Position: Sitting, Cuff Size: Normal)   Pulse 73   Ht 4\' 10"  (1.473 m)   Wt 133 lb (60.3 kg)   BMI 27.80 kg/m  , BMI Body mass index is 27.8 kg/m. GEN: Well nourished, well developed, in no acute distress  HEENT: normal  Neck: no JVD, carotid bruits, or masses Cardiac: RRR ; no murmurs, rubs, or gallops,no edema  Respiratory:  clear to auscultation bilaterally, normal work of breathing GI: soft, nontender, nondistended, + BS MS: no deformity or atrophy  Skin: warm and dry, no rash Neuro:  Strength and sensation are intact Psych: euthymic mood, full affect   EKG:  EKG is  ordered today. EKG showed normal sinus rhythm with a heart rate of 73 bpm. No PVCs.  Recent Labs: 11/23/2016: Hemoglobin 15.0; Magnesium 2.0; Platelets 218 04/23/2017: ALT 10; BUN 15; Creatinine, Ser 1.03; Potassium 4.3; Sodium 141    Lipid Panel    Component Value Date/Time   CHOL 243 (H) 01/06/2013 1157   TRIG 186.0 (H) 01/06/2013 1157   HDL 59.40 01/06/2013 1157   CHOLHDL 4 01/06/2013 1157   VLDL 37.2 01/06/2013 1157   LDLCALC 107 (H) 01/05/2010 1003   LDLDIRECT 148.1 01/06/2013 1157      Wt Readings from Last 3 Encounters:  05/23/17 133 lb (60.3 kg)  04/26/17 132 lb (59.9 kg)  04/23/17 131 lb 8 oz (59.6 kg)       No flowsheet data found.    ASSESSMENT AND PLAN:  1.  Symptomatic PVCs: Improved significantly after increasing metoprolol.  2. History of sinus tachycardia: Resolved.  3. Essential hypertension:  Blood pressure is reasonably controlled on current dose of metoprolol.  Disposition:   FU with me in  6 months  Signed,  Kathlyn Sacramento, MD  05/23/2017 11:44 AM    [place]

## 2017-06-10 ENCOUNTER — Other Ambulatory Visit: Payer: Self-pay

## 2017-06-10 MED ORDER — HYDROCODONE-ACETAMINOPHEN 7.5-325 MG PO TABS: ORAL_TABLET | ORAL | 0 refills | Status: DC

## 2017-06-10 NOTE — Telephone Encounter (Signed)
Printed.  I wish her and her family only the best.  Thanks.

## 2017-06-10 NOTE — Telephone Encounter (Signed)
Pt left v/m requesting rx hydrocodone apap. Call when ready for pick up. Pt last seen and rx last printed # 90 on 04/26/17. Pts daughter has cancer and pt is going to [facility] to help with pts daughter and pt is not sure when she will return home. Pt request rx printed today.

## 2017-06-10 NOTE — Telephone Encounter (Signed)
Patient called to see if Rx was ready for pickup.  Patient advised. Rx left at front desk for pick up.

## 2017-06-14 ENCOUNTER — Ambulatory Visit: Payer: Medicare HMO | Admitting: Obstetrics & Gynecology

## 2017-07-01 ENCOUNTER — Other Ambulatory Visit: Payer: Self-pay | Admitting: Family Medicine

## 2017-08-06 ENCOUNTER — Encounter (INDEPENDENT_AMBULATORY_CARE_PROVIDER_SITE_OTHER): Payer: Self-pay

## 2017-08-06 ENCOUNTER — Encounter: Payer: Self-pay | Admitting: Family Medicine

## 2017-08-06 ENCOUNTER — Ambulatory Visit (INDEPENDENT_AMBULATORY_CARE_PROVIDER_SITE_OTHER): Payer: Medicare HMO | Admitting: Family Medicine

## 2017-08-06 VITALS — BP 146/80 | HR 76 | Temp 98.4°F | Wt 131.2 lb

## 2017-08-06 DIAGNOSIS — J4530 Mild persistent asthma, uncomplicated: Secondary | ICD-10-CM

## 2017-08-06 DIAGNOSIS — B0229 Other postherpetic nervous system involvement: Secondary | ICD-10-CM | POA: Diagnosis not present

## 2017-08-06 DIAGNOSIS — J0100 Acute maxillary sinusitis, unspecified: Secondary | ICD-10-CM

## 2017-08-06 DIAGNOSIS — F411 Generalized anxiety disorder: Secondary | ICD-10-CM | POA: Diagnosis not present

## 2017-08-06 MED ORDER — AMOXICILLIN 875 MG PO TABS: 875.0000 mg | ORAL_TABLET | Freq: Two times a day (BID) | ORAL | 0 refills | Status: AC

## 2017-08-06 MED ORDER — LORAZEPAM 1 MG PO TABS: 0.5000 mg | ORAL_TABLET | Freq: Every evening | ORAL | Status: DC | PRN

## 2017-08-06 MED ORDER — HYDROCODONE-ACETAMINOPHEN 7.5-325 MG PO TABS: ORAL_TABLET | ORAL | 0 refills | Status: DC

## 2017-08-06 NOTE — Progress Notes (Signed)
duration of symptoms: ~3 weeks.  Rhinorrhea: yes congestion:yes ear pain:no but ear ringing noted at baseline.  sore throat:no Cough:some  Myalgias:no  Facial pressure noted.    See AVS.  She is having trouble paying for her inhaler, d/w pt.    Her daughter had thyroid cancer treatment and the patient has been worried about her.  She still misses her husband.   Still with post shingles pain.  She is trying to slowly taper her hydrocodone use.  D/w pt.  rx printed.    She is cutting back on lorazepam use.   Med list updated.  No ADE on meds.    Per HPI unless specifically indicated in ROS section   Meds, vitals, and allergies reviewed.   GEN: nad, alert and oriented HEENT: mucous membranes moist, TM w/o erythema, nasal epithelium injected, OP with cobblestoning, sinuses ttp x4 NECK: supple w/o LA, UAN noted CV: rrr. PULM: ctab, no inc wob ABD: soft, +bs EXT: no edema

## 2017-08-06 NOTE — Patient Instructions (Addendum)
Check with the insurance or pharmacy and see if there is a cheaper inhaler that we can substitute.   Start the amoxil and if not better then update me- we may need to get a prednisone course.  Take care.  Glad to see you.

## 2017-08-07 ENCOUNTER — Other Ambulatory Visit: Payer: Self-pay | Admitting: Family Medicine

## 2017-08-07 NOTE — Assessment & Plan Note (Signed)
She will prick check inhalers with her insurance to try to find a more affordable option. She will let me know.

## 2017-08-07 NOTE — Assessment & Plan Note (Signed)
Start amoxicillin. Supportive care. Nontoxic. Lungs are clear. Okay for outpatient follow-up. She agrees.

## 2017-08-07 NOTE — Telephone Encounter (Signed)
Received refill electronically Last office visit 08/06/17 Refill request does not match medication list which was refilled 04/26/17

## 2017-08-07 NOTE — Assessment & Plan Note (Signed)
She has been able to taper her lorazepam use. Med list updated.

## 2017-08-07 NOTE — Assessment & Plan Note (Signed)
Unfortunately she has had persistent pain but she is slowly trying to taper her hydrocodone use without adverse effect. She still gets some pain relief from the medication. Prescription printed.

## 2017-08-08 NOTE — Telephone Encounter (Signed)
Patient states please order this Rx.

## 2017-08-08 NOTE — Telephone Encounter (Signed)
Please check with patient about this. She was going to price check options and see if any other inhaler was cheaper. Please let me know. Thanks.

## 2017-08-08 NOTE — Telephone Encounter (Signed)
Left detailed message on voicemail to return call with info. 

## 2017-08-12 ENCOUNTER — Telehealth: Payer: Self-pay

## 2017-08-12 MED ORDER — PREDNISONE 10 MG PO TABS: ORAL_TABLET | ORAL | 0 refills | Status: DC

## 2017-08-12 NOTE — Telephone Encounter (Signed)
Patient advised.

## 2017-08-12 NOTE — Telephone Encounter (Signed)
Pt left v/m; pt seen 08/06/17 and pt was to cb if not better and request prednisone dose pak discussed at 08/06/17 appt; pt is going out of town and request cb when sent to [address] pharmacy.

## 2017-08-12 NOTE — Telephone Encounter (Signed)
Sent.  Take with food.  Update [place] if not better.  Thanks.

## 2017-08-19 ENCOUNTER — Emergency Department
Admission: EM | Admit: 2017-08-19 | Discharge: 2017-08-20 | Disposition: A | Discharge status: Home / Self Care | Payer: Medicare Other | Attending: Emergency Medicine | Admitting: Emergency Medicine

## 2017-08-19 DIAGNOSIS — R197 Diarrhea, unspecified: Secondary | ICD-10-CM | POA: Insufficient documentation

## 2017-08-19 DIAGNOSIS — R1013 Epigastric pain: Secondary | ICD-10-CM

## 2017-08-19 DIAGNOSIS — R9431 Abnormal electrocardiogram [ECG] [EKG]: Secondary | ICD-10-CM | POA: Insufficient documentation

## 2017-08-19 DIAGNOSIS — Z5181 Encounter for therapeutic drug level monitoring: Secondary | ICD-10-CM | POA: Insufficient documentation

## 2017-08-19 DIAGNOSIS — Z79899 Other long term (current) drug therapy: Secondary | ICD-10-CM | POA: Insufficient documentation

## 2017-08-19 DIAGNOSIS — R531 Weakness: Secondary | ICD-10-CM | POA: Insufficient documentation

## 2017-08-19 DIAGNOSIS — R630 Anorexia: Secondary | ICD-10-CM | POA: Insufficient documentation

## 2017-08-19 DIAGNOSIS — R109 Unspecified abdominal pain: Secondary | ICD-10-CM | POA: Insufficient documentation

## 2017-08-19 DIAGNOSIS — R110 Nausea: Secondary | ICD-10-CM | POA: Insufficient documentation

## 2017-08-19 HISTORY — DX: Cardiac arrhythmia, unspecified: I49.9

## 2017-08-19 HISTORY — DX: Unspecified asthma, uncomplicated: J45.909

## 2017-08-19 HISTORY — DX: Essential (primary) hypertension: I10

## 2017-08-20 ENCOUNTER — Emergency Department (HOSPITAL_COMMUNITY): Payer: Medicare Other

## 2017-08-20 ENCOUNTER — Encounter (HOSPITAL_COMMUNITY): Payer: Self-pay

## 2017-08-20 DIAGNOSIS — Z79899 Other long term (current) drug therapy: Secondary | ICD-10-CM | POA: Diagnosis not present

## 2017-08-20 DIAGNOSIS — R109 Unspecified abdominal pain: Secondary | ICD-10-CM | POA: Diagnosis not present

## 2017-08-20 DIAGNOSIS — R000 Tachycardia, unspecified: Secondary | ICD-10-CM | POA: Diagnosis not present

## 2017-08-20 DIAGNOSIS — Z5181 Encounter for therapeutic drug level monitoring: Secondary | ICD-10-CM | POA: Diagnosis not present

## 2017-08-20 DIAGNOSIS — R110 Nausea: Secondary | ICD-10-CM | POA: Diagnosis not present

## 2017-08-20 DIAGNOSIS — J9811 Atelectasis: Secondary | ICD-10-CM | POA: Diagnosis not present

## 2017-08-20 DIAGNOSIS — R9431 Abnormal electrocardiogram [ECG] [EKG]: Secondary | ICD-10-CM | POA: Diagnosis not present

## 2017-08-20 DIAGNOSIS — R531 Weakness: Secondary | ICD-10-CM | POA: Diagnosis not present

## 2017-08-20 DIAGNOSIS — R197 Diarrhea, unspecified: Secondary | ICD-10-CM | POA: Diagnosis not present

## 2017-08-20 DIAGNOSIS — R630 Anorexia: Secondary | ICD-10-CM | POA: Diagnosis not present

## 2017-08-20 DIAGNOSIS — I517 Cardiomegaly: Secondary | ICD-10-CM | POA: Diagnosis not present

## 2017-08-20 DIAGNOSIS — R1013 Epigastric pain: Secondary | ICD-10-CM | POA: Diagnosis not present

## 2017-08-20 LAB — HEPATIC FUNCTION PANEL
ALBUMIN: 4.2 g/dL (ref 3.2–4.6)
ALKALINE PHOSPHATASE: 67 U/L (ref 20–130)
ALT (SGPT): 8 U/L (ref <=52)
AST (SGOT): 18 U/L (ref <=35)
BILIRUBIN DIRECT: 0.1 mg/dL (ref <=0.3)
BILIRUBIN TOTAL: 0.7 mg/dL (ref 0.3–1.2)
PROTEIN TOTAL: 7.1 g/dL (ref 6.0–8.3)

## 2017-08-20 LAB — URINALYSIS, MACRO/MICRO
GLUCOSE: NEGATIVE mg/dL
NITRITE: NEGATIVE
PH: 6.5 (ref 5.0–7.0)
PROTEIN: NEGATIVE mg/dL
SPECIFIC GRAVITY: 1.011 (ref 1.010–1.025)
UROBILINOGEN: 0.2 mg/dL

## 2017-08-20 LAB — CBC WITH DIFF
BASOPHIL #: 0.1 10*3/uL (ref 0.00–0.20)
BASOPHIL %: 1 %
EOSINOPHIL #: 0.3 10*3/uL (ref 0.00–0.50)
EOSINOPHIL %: 3 %
HCT: 46.2 % (ref 34.6–46.2)
HGB: 15.5 g/dL (ref 11.8–15.8)
LYMPHOCYTE #: 1.6 10*3/uL (ref 0.90–3.40)
LYMPHOCYTE %: 14 %
MCH: 30.2 pg (ref 27.6–33.2)
MCHC: 33.6 g/dL (ref 32.6–35.4)
MCV: 90 fL (ref 82.3–96.7)
MONOCYTE #: 0.9 10*3/uL (ref 0.20–0.90)
MONOCYTE %: 8 %
MPV: 8.2 fL (ref 6.6–10.2)
NEUTROPHIL #: 8.4 10*3/uL — ABNORMAL HIGH (ref 1.50–6.40)
NEUTROPHIL %: 75 %
PLATELETS: 228 10*3/uL (ref 140–440)
RBC: 5.13 10*6/uL (ref 3.80–5.24)
RDW: 13.5 % (ref 12.4–15.2)
WBC: 11.3 10*3/uL — ABNORMAL HIGH (ref 3.5–10.3)

## 2017-08-20 LAB — LIPASE: LIPASE: 20 U/L (ref 0–82)

## 2017-08-20 LAB — VENOUS BLOOD GAS
%FIO2 (VENOUS): 21 %
BASE EXCESS: 5.6 mmol/L — ABNORMAL HIGH (ref ?–2.0)
BICARBONATE (VENOUS): 27.2 mmol/L (ref 23.0–33.0)
PCO2 (VENOUS): 60 mmHg (ref 38.00–50.00)
PH (VENOUS - TEMP CORRECTED): 7.4
PH (VENOUS): 7.35 (ref 7.32–7.45)
PO2 (VENOUS - TEMP CORRECTED): 16 mmHg
PO2 (VENOUS): 16 mmHg — ABNORMAL LOW (ref 30.0–50.0)
TCO2: 34.9
TEMPERATURE, COMP: 37 C (ref 15.0–45.0)

## 2017-08-20 LAB — BASIC METABOLIC PANEL
ANION GAP: 11 mmol/L
BUN/CREA RATIO: 15
BUN: 14 mg/dL (ref 10–25)
CALCIUM: 10 mg/dL (ref 8.2–10.2)
CHLORIDE: 99 mmol/L (ref 98–111)
CO2 TOTAL: 28 mmol/L (ref 21–35)
CREATININE: 0.93 mg/dL (ref <=1.30)
ESTIMATED GFR: 58 mL/min/{1.73_m2} — ABNORMAL LOW
GLUCOSE: 115 mg/dL — ABNORMAL HIGH (ref 70–110)
POTASSIUM: 4.4 mmol/L (ref 3.5–5.0)
SODIUM: 138 mmol/L (ref 135–145)

## 2017-08-20 LAB — TROPONIN-I: TROPONIN I: 0.01 ng/mL (ref <=0.04)

## 2017-08-20 LAB — LACTIC ACID LEVEL: LACTIC ACID: 1.6 mmol/L (ref <2.0)

## 2017-08-20 MED ORDER — SODIUM CHLORIDE 0.9 % IV BOLUS
1000.0000 mL | INJECTION | Status: AC
Start: 2017-08-20 — End: 2017-08-20
  Administered 2017-08-20: 0 mL via INTRAVENOUS

## 2017-08-20 MED ORDER — FAMOTIDINE 20 MG TABLET: 20 mg | Tab | Freq: Two times a day (BID) | ORAL | 0 refills | 0 days | Status: AC

## 2017-08-20 MED ORDER — ONDANSETRON HCL (PF) 4 MG/2 ML INJECTION SOLUTION
4.00 mg | INTRAMUSCULAR | Status: AC
Start: 2017-08-20 — End: 2017-08-20
  Administered 2017-08-20: 4 mg via INTRAVENOUS
  Filled 2017-08-20: qty 2

## 2017-08-20 MED ORDER — GI COCKTAIL (ANTACID SUSP, HYOSCYAMINE, LIDOCAINE)
55.00 mL | ORAL | Status: AC
Start: 2017-08-20 — End: 2017-08-20
  Administered 2017-08-20: 55 mL via ORAL
  Filled 2017-08-20: qty 55

## 2017-08-20 NOTE — ED Provider Notes (Signed)
[facility]  Emergency Department  HPI - 08/20/2017  Impression:   Encounter Diagnosis   Name Primary?   . Epigastric pain Yes       MDM/Course:    Patient presents with epigastric pain    She is vitally stable and has intermittent episodes of bigeminy - she sts this is chronic and has been worked up and followed for this    Labs obtained and reviewed    Slightly leukocytosis- WBC 11.3 , troponin 0.01, lactate normal, UA with small leukocytosis ( will trend culture)    KUB flat and upright - no evidence of BO or free air    GI cocktail given- improvement in symptoms    Will give Pepcid as outpatient for symptoms    Discussed obtaining a CT- At this time patient is symptoms free with reassuring abdominal exam. Will not get CT at this time. Discussed strict precautions to return for any new or worsening symptoms    Will follow up with PCP next week.    Of note- Following discharge patient was called in regards to Urine culture. Was called in Keflex.   Consults: none   Disposition:       Following the above history, physical exam, and studies, the patient was deemed stable and suitable for discharge.   she will follow up with PCP in 1 week    Pepcid was prescribed.  Medication instructions were discussed with the patient/patient's family.   It was advised that the patient return to the ED with any new, concerning or worsening symptoms and follow up as directed.    [name] family verbalized understanding of all instructions and had no further questions or concerns.     Chief Complaint:   Abdominal Pain  History of Present Illness:   Amanda Mcgrath, 83 y.o. female   Significant PMH: HTN, Irregular heart beat, Asthma   Significant PSH: Hysterectomy and cholecystectomy   Pt arrives via POV with c/o abdominal pain. Pt sts she recently had a sinus infection and completed her prescription of Amoxicillin. Pt sts this has given her diarrhea for 4 days but has stopped today. Pt reports associated  nausea, generalized weakness, and decreased appetite. Pt denies h/o heart issues. Pt denies chest pain, SOB, vomiting, dysuria, or any other sxs at this time.       History Limitations: None    Review of Systems:   Constitutional: No fever, chills. +generalized weakness, decrease appetite   Skin: No rashes or discharge   HENT: No headaches, congestion   Eyes: No vision changes, discharge   Cardio: No chest pain, palpitations or leg swelling    Respiratory: No cough, wheezing or SOB   GI:  No vomiting, constipation. +nausea, diarrhea, abdominal pain    GU:  No dysuria, hematuria, polyuria   MSK: No joint or back pain   Neuro: No loss of sensation, focal deficits   Psych: No mood changes.     Medications:  None       Allergies:  No Known Allergies    Past Medical History:  Past Medical History:   Diagnosis Date   . Asthma    . HTN (hypertension)    . Irregular heart beat        Past Surgical History:  History reviewed. No past surgical history pertinent negatives.        Social History:  Social History     Social History   . Marital status: Widowed  Spouse name: N/A   . Number of children: N/A   . Years of education: N/A     Occupational History   . Not on file.     Social History Main Topics   . Smoking status: Not on file   . Smokeless tobacco: Not on file   . Alcohol use Not on file   . Drug use: Not on file   . Sexual activity: Not on file     Other Topics Concern   . Not on file     Social History Narrative   . No narrative on file       Family History:  Family Medical History     None              Physical Exam:  All nurse's notes reviewed.  ED Triage Vitals   Enc Vitals Group      BP (Non-Invasive) 08/19/17 2321 134/76      Heart Rate 08/19/17 2321 100      Respiratory Rate 08/19/17 2321 16      Temperature 08/19/17 2321 36.5 C (97.7 F)      Temp src --       SpO2-1 08/19/17 2321 99 %      Weight 08/19/17 2321 59.4 kg (131 lb)      Height 08/19/17 2321 1.448 m (4\' 9" )      Head Cir --        Peak Flow --       Pain Score --       Pain Loc --       Pain Edu? --       Excl. in GC? --         Patient does not need supplemental oxygen     Constitutional: Well appearing pleasant women, participating well with exam    HENT:    Head: NC AT    Mouth/Throat: Oropharynx is clear and moist.    Eyes: PERRL, EOMI, Conjunctivae without discharge   Neck: Trachea midline.    Cardiovascular: RRR, No murmurs, rubs or gallops.    Pulmonary/Chest: BS equal bilaterally, good air movement. No respiratory distress. No wheezes, rales or chest tenderness.    Abdominal: BS +. Abdomen soft, mild TTP in the epigastric region, rebound or guarding.                Musculoskeletal: No obvious deformity, swelling,    Skin: Warm and dry. No rash, erythema, pallor or cyanosis   Psychiatric: Behavior is normal. Mood and affect congruent.     Neurological: Alert&Ox3. Grossly intact.     Labs:  Results for orders placed or performed during the hospital encounter of 08/19/17 (from the past 24 hour(s))   BASIC METABOLIC PANEL   Result Value Ref Range    SODIUM 138 135 - 145 mmol/L    POTASSIUM 4.4 3.5 - 5.0 mmol/L    CHLORIDE 99 98 - 111 mmol/L    CO2 TOTAL 28 21 - 35 mmol/L    ANION GAP 11 mmol/L    CALCIUM 10.0 8.2 - 10.2 mg/dL    GLUCOSE 161 (H) 70 - 110 mg/dL    BUN 14 10 - 25 mg/dL    CREATININE 0.96 <=0.45 mg/dL    BUN/CREA RATIO 15     ESTIMATED GFR 58 (L) Avg: 75 mL/min/1.38m^2   CBC/DIFF    Narrative    The following orders were created for panel order CBC/DIFF.  Procedure  Abnormality         Status                     ---------                               -----------         ------                     CBC WITH DIFF[[id number]]                Abnormal            Final result                 Please view results for these tests on the individual orders.   HEPATIC FUNCTION PANEL   Result Value Ref Range    ALBUMIN 4.2 3.2 - 4.6 g/dL    ALKALINE PHOSPHATASE 67 20 - 130 U/L    ALT (SGPT) 8 <=52  U/L    AST (SGOT) 18 <=35 U/L    BILIRUBIN TOTAL 0.7 0.3 - 1.2 mg/dL    BILIRUBIN DIRECT 0.1 <=0.3 mg/dL    PROTEIN TOTAL 7.1 6.0 - 8.3 g/dL   LIPASE   Result Value Ref Range    LIPASE 20 0 - 82 U/L   URINALYSIS WITH REFLEX MICROSCOPIC AND CULTURE IF POSITIVE    Narrative    The following orders were created for panel order URINALYSIS WITH REFLEX MICROSCOPIC AND CULTURE IF POSITIVE.  Procedure                               Abnormality         Status                     ---------                               -----------         ------                     URINALYSIS, MACRO/MICRO[[id number]]      Abnormal            Final result                 Please view results for these tests on the individual orders.   CBC WITH DIFF   Result Value Ref Range    WBC 11.3 (H) 3.5 - 10.3 x10^3/uL    RBC 5.13 3.80 - 5.24 x10^6/uL    HGB 15.5 11.8 - 15.8 g/dL    HCT 16.1 09.6 - 04.5 %    MCV 90.0 82.3 - 96.7 fL    MCH 30.2 27.6 - 33.2 pg    MCHC 33.6 32.6 - 35.4 g/dL    RDW 40.9 81.1 - 91.4 %    PLATELETS 228 140 - 440 x10^3/uL    MPV 8.2 6.6 - 10.2 fL    NEUTROPHIL % 75 %    LYMPHOCYTE % 14 %    MONOCYTE % 8 %    EOSINOPHIL % 3 %    BASOPHIL % 1 %    NEUTROPHIL # 8.40 (H) 1.50 - 6.40 x10^3/uL    LYMPHOCYTE # 1.60 0.90 -  3.40 x10^3/uL    MONOCYTE # 0.90 0.20 - 0.90 x10^3/uL    EOSINOPHIL # 0.30 0.00 - 0.50 x10^3/uL    BASOPHIL # 0.10 0.00 - 0.20 x10^3/uL   URINALYSIS, MACRO/MICRO   Result Value Ref Range    COLOR Yellow Yellow, Straw, Colorless    APPEARANCE Clear Clear, Cloudy    SPECIFIC GRAVITY 1.011 1.010 - 1.025    PH 6.5 5.0 - 7.0    LEUKOCYTES Small (A) Negative WBCs/uL    NITRITE Negative Negative    PROTEIN Negative Negative, Trace mg/dL    GLUCOSE Negative Negative mg/dL    KETONES Trace (A) Negative mg/dL    UROBILINOGEN 0.2  normal, 0.2 , 1.0 mg/dL    BILIRUBIN Negative Negative mg/dL    BLOOD Trace (A) Negative mg/dL    RBCS 0-2 (A) None /hpf    WBCS 5-10 (A) None /hpf    BACTERIA None None /hpf    SQUAMOUS EPITHELIAL None  None, Rare, Occasional /hpf    HYALINE CASTS None None, Rare, Few, Occasional, Many /lpf   THYROID STIMULATING HORMONE (SENSITIVE TSH)   Result Value Ref Range    TSH 1.851 0.450 - 5.330 uIU/mL   LACTIC ACID   Result Value Ref Range    LACTIC ACID 1.6 <2.0 mmol/L   VENOUS BLOOD GAS   Result Value Ref Range    BICARBONATE (VENOUS) 27.2 23.0 - 33.0 mmol/L    PCO2 (VENOUS) 60.00 (HH) 38.00 - 50.00 mm/Hg    PH (VENOUS) 7.35 7.32 - 7.45    PH (TEMPERATURE CORRECTED) 7.4     PO2 (VENOUS) 16.0 (L) 30.0 - 50.0 mm/Hg    PO2 (VENOUS - TEMP CORRECTED) 16.0 mm/Hg    TEMPERATURE, COMP 37.0 15.0 - 45.0 C    %FIO2 (VENOUS) 21.0 %    BASE EXCESS 5.6 (H) -2.0 - 3.0 mmol/L    TCO2 34.9    TROPONIN-I   Result Value Ref Range    TROPONIN I 0.01 <=0.04 ng/mL       Imaging:    Results for orders placed or performed during the hospital encounter of 08/19/17 (from the past 72 hour(s))   XR ABD FLAT AND UPRIGHT SERIES (W PA CHEST)     Status: None    Narrative    [place] [facility] [place]    PROCEDURE DESCRIPTION: XR ABD FLAT AND UPRIGHT SERIES (W PA CHEST)    PROCEDURE PERFORMED DATE AND TIME: 08/20/2017 12:51 AM    CLINICAL INDICATION: epigastric pain and nausea    TECHNIQUE: 3 views / 3 images submitted.      FINDINGS: There is a moderate lower thoracic and lumbar scoliosis convex  left, apex of scoliosis at L1. No bowel obstruction or free air detected.  There are clips in the right upper quadrant from prior surgery. There is  marked elevation of the left diaphragm similar to November 29, 2005. There  is some atelectasis/scarring along the elevated left diaphragm similar to  the prior study. Mediastinum is shifted slightly to the right probably  related to the elevated left diaphragm. Right lung is grossly clear      Impression    Marked elevation of the left diaphragm with some atelectasis at the left  lung base. The findings are similar to November 29, 2005. No bowel  obstruction or free air.         ECG: sinus rhythm with ventricular bigeminy . No  previous EKG since 2006.     Medical Records  reviewed including appropriate labs and imaging.   I am scribing for, and in the presence of, Dr. Laurin Coder for services provided on 08/19/2017.  Amanda Mcgrath, SCRIBE   Amanda Mcgrath, SCRIBE  08/20/2017, 00:13    I personally performed the services described in this documentation, as scribed  in my presence, and it is both accurate  and complete.    Jolaine Artist, MD  Jolaine Artist, MD  08/30/2017, 06:13

## 2017-08-20 NOTE — Discharge Instructions (Signed)
Please return for new or worsening symptoms   Please follow up with PCP in 1 week

## 2017-08-21 LAB — ECG 12 LEAD - ED USE
Calculated P Axis: 36 deg
EKG Severity: ABNORMAL
Heart Rate: 109 {beats}/min
I 40 Axis: 36 deg
PR Interval: 123 ms
QTC Calculation: 469 ms
T 40 Axis: 21 deg

## 2017-08-22 LAB — URINE CULTURE,ROUTINE: URINE CULTURE: 100000 — AB

## 2017-08-24 ENCOUNTER — Encounter (HOSPITAL_COMMUNITY): Payer: Self-pay | Admitting: Family

## 2017-08-29 ENCOUNTER — Other Ambulatory Visit (HOSPITAL_COMMUNITY): Payer: Self-pay | Admitting: Family

## 2017-08-29 MED ORDER — CEPHALEXIN 500 MG CAPSULE: 500 mg | Cap | Freq: Three times a day (TID) | ORAL | 0 refills | 0 days | Status: AC

## 2017-09-12 ENCOUNTER — Ambulatory Visit: Payer: Medicare HMO | Admitting: Family Medicine

## 2017-09-19 ENCOUNTER — Ambulatory Visit (INDEPENDENT_AMBULATORY_CARE_PROVIDER_SITE_OTHER): Payer: Medicare HMO | Admitting: Family Medicine

## 2017-09-19 ENCOUNTER — Encounter: Payer: Self-pay | Admitting: Family Medicine

## 2017-09-19 VITALS — BP 102/60 | HR 49 | Temp 97.7°F | Wt 129.8 lb

## 2017-09-19 DIAGNOSIS — K219 Gastro-esophageal reflux disease without esophagitis: Secondary | ICD-10-CM

## 2017-09-19 DIAGNOSIS — Z87440 Personal history of urinary (tract) infections: Secondary | ICD-10-CM | POA: Diagnosis not present

## 2017-09-19 NOTE — Progress Notes (Signed)
ER eval.  [place].  Epigastric pain resolved after ER eval.  Incidentally found out to have ucx positive, s/p treatment with keflex.  D/w pt about C&S report.  Has been on prevacid in the meantime per ER rx.    She doesn't have burning with urination consistently but occ mild discomfort.  She has some urinary frequency, which is variable.    Done with abx for about 13-14 days.    Meds, vitals, and allergies reviewed.   ROS: Per HPI unless specifically indicated in ROS section   GEN: nad, alert and oriented HEENT: mucous membranes moist NECK: supple w/o LA CV: rrr. PULM: ctab, no inc wob ABD: soft, +bs EXT: no edema

## 2017-09-19 NOTE — Patient Instructions (Addendum)
We'll contact you with your lab report. Flu shot later on.  Try to stop prevacid after the month long prescription and see how you do.   Take care.  Glad to see you.

## 2017-09-20 DIAGNOSIS — Z87440 Personal history of urinary (tract) infections: Secondary | ICD-10-CM | POA: Insufficient documentation

## 2017-09-20 LAB — URINE CULTURE
MICRO NUMBER:: 81104260
SPECIMEN QUALITY:: ADEQUATE

## 2017-09-20 NOTE — Assessment & Plan Note (Signed)
Abdominal pain is resolved. Reasonable to try to wean off Prevacid after she finishes the current month supply.

## 2017-09-20 NOTE — Assessment & Plan Note (Signed)
She had positive urine culture. Was treated appropriately. No consistent dysuria now. She has some occasional discomfort with urination. Recheck urine culture today. Okay for outpatient follow-up. Labs and workup from emergency room discussed with patient.

## 2017-09-26 ENCOUNTER — Telehealth: Payer: Self-pay | Admitting: Family Medicine

## 2017-09-26 NOTE — Telephone Encounter (Signed)
[place]  Patient Name: Jillian Oconnell  DOB: 11/24/1934    Initial Comment Caller states she has cough and congestion,   Nurse Assessment  Nurse: Sherrell Puller, RN, Amy Date/Time (Eastern Time): 09/26/2017 10:51:49 AM  Confirm and document reason for call. If symptomatic, describe symptoms. ---Caller states she has a productive cough with yellow sputum and head congestion, headache, no fever.  Does the patient have any new or worsening symptoms? ---Yes  Will a triage be completed? ---Yes  Related visit to physician within the last 2 weeks? ---Yes  Does the PT have any chronic conditions? (i.e. diabetes, asthma, etc.) ---Yes  List chronic conditions. 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  Is this a behavioral health or substance abuse call? ---No     Guidelines    Guideline Title Affirmed Question Affirmed Notes  Sinus Pain or Congestion Lots of coughing    Final Disposition User   See PCP When Office is Open (within 3 days) Sherrell Puller, RN, Amy    Comments  Visit scheduled for 09/27/17 @ 10:15am with Dr. Danise Mina at [facility]   Referrals  REFERRED TO PCP OFFICE   Caller Disagree/Comply Comply  Caller Understands Yes  PreDisposition Home Care

## 2017-09-27 ENCOUNTER — Ambulatory Visit: Payer: Self-pay | Admitting: Family Medicine

## 2017-09-27 MED ORDER — AMOXICILLIN 875 MG PO TABS: 875.0000 mg | ORAL_TABLET | Freq: Two times a day (BID) | ORAL | 0 refills | Status: AC

## 2017-09-27 NOTE — Telephone Encounter (Signed)
Network difficulty so unable to let patient know at home #.  Message left on cell phone.

## 2017-09-27 NOTE — Telephone Encounter (Signed)
Pt left v/m; pt has cancelled appt for today due to no power or water and pt wants to know if med can be sent to [facility] [address]. Pt had symptoms when seen 09/19/17; pt has prod cough with yellow phlegm, h/a and head congestion; no fever.Please advise.

## 2017-09-27 NOTE — Telephone Encounter (Signed)
Given the situation, amoxil sent and f/u as needed.  Thanks.

## 2017-10-14 ENCOUNTER — Other Ambulatory Visit: Payer: Self-pay | Admitting: Family Medicine

## 2017-10-14 ENCOUNTER — Other Ambulatory Visit: Payer: Self-pay | Admitting: *Deleted

## 2017-10-14 ENCOUNTER — Telehealth: Payer: Self-pay | Admitting: Family Medicine

## 2017-10-14 NOTE — Telephone Encounter (Signed)
Patient refill request.  Hydrocodone Last office visit:   09/19/17 Last Filled:     90 tablet 0 08/06/2017  Please advise.

## 2017-10-14 NOTE — Telephone Encounter (Signed)
Copied from [place] [id number]. Topic: Quick Communication - See Telephone Encounter >> Oct 14, 2017 12:06 PM Ether Griffins B wrote: CRM for notification. See Telephone encounter for:  10/14/17. RX medication refill hydrocodone

## 2017-10-14 NOTE — Telephone Encounter (Signed)
RF request submitted to Dr. Damita Dunnings.

## 2017-10-14 NOTE — Telephone Encounter (Signed)
Last refill 08/06/17 #90 Last office visit 09/19/17

## 2017-10-14 NOTE — Telephone Encounter (Signed)
Copied from [place] [id number]. Topic: Quick Communication - See Telephone Encounter >> Oct 14, 2017 12:06 PM Ether Griffins B wrote: CRM for notification. See Telephone encounter for:  10/14/17. RX medication refill hydrocodone

## 2017-10-15 MED ORDER — HYDROCODONE-ACETAMINOPHEN 7.5-325 MG PO TABS: ORAL_TABLET | ORAL | 0 refills | Status: DC

## 2017-10-15 NOTE — Telephone Encounter (Signed)
Patient advised.  Rx left at front desk for pick up. 

## 2017-10-15 NOTE — Telephone Encounter (Signed)
See other note. Thanks.

## 2017-10-15 NOTE — Telephone Encounter (Signed)
Printed.  Thanks.  

## 2017-10-17 ENCOUNTER — Telehealth: Payer: Self-pay | Admitting: Family Medicine

## 2017-10-17 NOTE — Telephone Encounter (Signed)
Patient gets all her Rx's filled at [facility] on [address]. Patient has not had a prescription filled since August because she doesn't take as many pills as is prescribed.  This appears to be a result of the STOP ACT rule.  Please advise.

## 2017-10-17 NOTE — Telephone Encounter (Signed)
I spoke with [facility] on [address].  They have no record of filling this R/X from 10/15/17 for any quantity of pills.    Need to clarify with patient which pharmacy she took this script to and explain that she should be using the same pharmacy for all fills.  [facility] on [address] states that they have not filled this medication for her since August of this year.    I have left a message requesting patient to call back and clarify where she filled script.  Need to discuss further with pharmacy, is this result of STOP ACT rules?  If filled at a new pharmacy, may appear as an acute R/X which will only be allowed 5-7 day supply as a result.  I will not be here tomorrow and will copy Dr. Josefine Class CMA, Terri Skains, on this message so that she is aware.  Thanks.

## 2017-10-17 NOTE — Telephone Encounter (Signed)
Please check with pharmacy.  She is chronically on med, I checked the [place] database and she didn't have other fills.  She has been trying to wean down as much as possible on the pain med ,so that stretches the rx.  She will likely be on it chronically, at least in the near future.  What are they willing to fill?   I have no evidence of misuse by this patient.    Thanks.

## 2017-10-17 NOTE — Telephone Encounter (Signed)
Copied from [place] [id number]. Topic: Quick Communication - See Telephone Encounter >> Oct 17, 2017 12:53 PM Conception Chancy, NT wrote: CRM for notification. See Telephone encounter for:  10/17/17.  Pt is calling she talked with her pharmacy her pharmacy only filled her RX for 7 days and he wrote for it to be filled 30 days.

## 2017-10-18 NOTE — Telephone Encounter (Signed)
Spoke with pharmacy who states that if the patient lets them know that, they will be willing to fill the prescription.  Patient advised.

## 2017-10-23 DIAGNOSIS — H353131 Nonexudative age-related macular degeneration, bilateral, early dry stage: Secondary | ICD-10-CM | POA: Diagnosis not present

## 2017-10-23 DIAGNOSIS — H35371 Puckering of macula, right eye: Secondary | ICD-10-CM | POA: Diagnosis not present

## 2017-11-05 ENCOUNTER — Ambulatory Visit: Payer: Self-pay | Admitting: *Deleted

## 2017-11-05 NOTE — Telephone Encounter (Signed)
   Reason for Disposition . Taking new prescription medication  Answer Assessment - Initial Assessment Questions 1. STOOL PATTERN OR FREQUENCY: "How often do you pass bowel movements (BMs)?"  (Normal range: tid to q 3 days)  "When was the last BM passed?"       Normally-go daily 2-3 times, last moved bowels this am 2. STRAINING: "Do you have to strain to have a BM?"      This morning patient had hard stool- had  to put pressure at rectum when moved bowels 3. RECTAL PAIN: "Does your rectum hurt when the stool comes out?" If so, ask: "Do you have hemorrhoids? How bad is the pain?"  (Scale 1-10; or mild, moderate, severe)     A little- at rectum, Hx of hemorrhoids,  Not that bad- patient is more concerned about rectisil repair that she has had in the past- that she may be feeling it. 4. STOOL COMPOSITION: "Are the stools hard?"      Yes- firm 5. BLOOD ON STOOLS: "Has there been any blood on the toilet tissue or on the surface of the BM?" If so, ask: "When was the last time?"      no 6. CHRONIC CONSTIPATION: "Is this a new problem for you?"  If no, ask: How long have you had this problem?" (days, weeks, months)      Yes - this is a new episode- patient used to have loose stool 7. CHANGES IN DIET: "Have there been any recent changes in your diet?"      Patient does not eat very much 8. MEDICATIONS: "Have you been taking any new medications?"     Patient has been using a  Probiotic 9. LAXATIVES: "Have you been using any laxatives or enemas?"  If yes, ask "What, how often, and when was the last time?"     Patient has just used a stool softener- have not used laxative 10. CAUSE: "What do you think is causing the constipation?"        Decreased fluids and intake 11. OTHER SYMPTOMS: "Do you have any other symptoms?" (e.g., abdominal pain, fever, vomiting)       No other symptoms 12. PREGNANCY: "Is there any chance you are pregnant?" "When was your last menstrual period?"       n/a  Protocols  used: CONSTIPATION-A-AH  Patient advised home care- stool softeners, prune juice, increase fluids. Patient advised to use laxative only if needed- and only until bowels move-which they are moving now. Patient offered appointment this week- but she has family in and she wants to wait until they are gone. She will call next week for an appointment with Dr Damita Dunnings.

## 2017-11-05 NOTE — Telephone Encounter (Signed)
Noted and agree. 

## 2017-11-19 ENCOUNTER — Other Ambulatory Visit: Payer: Self-pay | Admitting: *Deleted

## 2017-11-19 NOTE — Telephone Encounter (Signed)
Faxed refill request. [facility] Last office visit:   09/19/2017 Last Filled:    90 tablet 0 10/15/2017  Please advise.

## 2017-11-20 MED ORDER — HYDROCODONE-ACETAMINOPHEN 7.5-325 MG PO TABS: ORAL_TABLET | ORAL | 0 refills | Status: DC

## 2017-11-20 NOTE — Telephone Encounter (Signed)
Printed.  Needs q28month opiate visits.  Thanks.  Unable to check [place] data base- currently down.

## 2017-11-20 NOTE — Telephone Encounter (Signed)
Patient notified by telephone that script is up front ready for pickup. Advised patient that she needs to schedule pain management appointment for further refills.

## 2017-11-21 ENCOUNTER — Ambulatory Visit: Payer: Medicare HMO

## 2017-11-21 ENCOUNTER — Ambulatory Visit: Payer: Medicare HMO | Admitting: Obstetrics & Gynecology

## 2017-11-21 ENCOUNTER — Encounter: Payer: Self-pay | Admitting: Obstetrics & Gynecology

## 2017-11-21 VITALS — BP 138/88 | HR 82 | Wt 126.4 lb

## 2017-11-21 DIAGNOSIS — Z0000 Encounter for general adult medical examination without abnormal findings: Secondary | ICD-10-CM

## 2017-11-21 DIAGNOSIS — Z23 Encounter for immunization: Secondary | ICD-10-CM

## 2017-11-21 DIAGNOSIS — K5909 Other constipation: Secondary | ICD-10-CM | POA: Diagnosis not present

## 2017-11-21 DIAGNOSIS — N898 Other specified noninflammatory disorders of vagina: Secondary | ICD-10-CM

## 2017-11-21 NOTE — Progress Notes (Signed)
RGYN pt presents for problem visit today.  Pt states she feels as if her "vagina is closing." No pain, denies any bleeding.

## 2017-11-21 NOTE — Progress Notes (Signed)
   Subjective:    Patient ID: Jillian Oconnell, female    DOB: [birth year], 83 y.o.   MRN: [id number]  HPI  83 yo widowed White P3 (all girls) here because she looked at her vulva with a mirror and felt that her vagina was shrinking. She has not been active for about 6-10 years. She does not plan to become sexually active. She reports normal bladder function and has chronic constipation. She had a rectocele repair in the distant past. She denies any vaginal discomfort.  Review of Systems     Objective:   Physical Exam Breathing, conversing, and ambulating normally Well nourished, well hydrated white female, no apparent distress Her vulva has atrophy. Her vagina is small and I am able to comfortable fit a finger inside. I handed her a mirror and pointed out her vulva anatomy.    Assessment & Plan:  Normal vagina- reassurance give Chronic constipation- recommend increase water intake, rec that she try Miralax (she used it prior to a colonoscopy and had no problems). Flu vaccine today

## 2017-12-18 ENCOUNTER — Encounter: Payer: Self-pay | Admitting: Radiology

## 2017-12-18 ENCOUNTER — Encounter: Payer: Self-pay | Admitting: Family Medicine

## 2017-12-18 ENCOUNTER — Ambulatory Visit (INDEPENDENT_AMBULATORY_CARE_PROVIDER_SITE_OTHER): Payer: Medicare HMO | Admitting: Family Medicine

## 2017-12-18 VITALS — BP 114/68 | HR 72 | Temp 97.8°F | Wt 129.5 lb

## 2017-12-18 DIAGNOSIS — B0229 Other postherpetic nervous system involvement: Secondary | ICD-10-CM | POA: Diagnosis not present

## 2017-12-18 DIAGNOSIS — R0602 Shortness of breath: Secondary | ICD-10-CM

## 2017-12-18 DIAGNOSIS — Z79891 Long term (current) use of opiate analgesic: Secondary | ICD-10-CM | POA: Diagnosis not present

## 2017-12-18 DIAGNOSIS — J0100 Acute maxillary sinusitis, unspecified: Secondary | ICD-10-CM

## 2017-12-18 LAB — CBC WITH DIFFERENTIAL/PLATELET
BASOS ABS: 0.1 10*3/uL (ref 0.0–0.1)
BASOS PCT: 1 % (ref 0.0–3.0)
EOS ABS: 0.5 10*3/uL (ref 0.0–0.7)
Eosinophils Relative: 8.1 % — ABNORMAL HIGH (ref 0.0–5.0)
HEMATOCRIT: 42.5 % (ref 36.0–46.0)
HEMOGLOBIN: 14 g/dL (ref 12.0–15.0)
LYMPHS PCT: 26.9 % (ref 12.0–46.0)
Lymphs Abs: 1.6 10*3/uL (ref 0.7–4.0)
MCHC: 33 g/dL (ref 30.0–36.0)
MCV: 93.3 fl (ref 78.0–100.0)
MONO ABS: 0.7 10*3/uL (ref 0.1–1.0)
Monocytes Relative: 11 % (ref 3.0–12.0)
Neutro Abs: 3.2 10*3/uL (ref 1.4–7.7)
Neutrophils Relative %: 53 % (ref 43.0–77.0)
Platelets: 281 10*3/uL (ref 150.0–400.0)
RBC: 4.55 Mil/uL (ref 3.87–5.11)
RDW: 14 % (ref 11.5–15.5)
WBC: 6 10*3/uL (ref 4.0–10.5)

## 2017-12-18 LAB — TSH: TSH: 3.69 u[IU]/mL (ref 0.35–4.50)

## 2017-12-18 LAB — COMPREHENSIVE METABOLIC PANEL
ALBUMIN: 3.9 g/dL (ref 3.5–5.2)
ALT: 10 U/L (ref 0–35)
AST: 19 U/L (ref 0–37)
Alkaline Phosphatase: 57 U/L (ref 39–117)
BILIRUBIN TOTAL: 0.3 mg/dL (ref 0.2–1.2)
BUN: 18 mg/dL (ref 6–23)
CHLORIDE: 99 meq/L (ref 96–112)
CO2: 34 meq/L — AB (ref 19–32)
CREATININE: 1 mg/dL (ref 0.40–1.20)
Calcium: 9.6 mg/dL (ref 8.4–10.5)
GFR: 56.55 mL/min — ABNORMAL LOW (ref >60.00)
Glucose, Bld: 66 mg/dL — ABNORMAL LOW (ref 70–99)
Potassium: 4.6 mEq/L (ref 3.5–5.1)
Sodium: 139 mEq/L (ref 135–145)
Total Protein: 7.4 g/dL (ref 6.0–8.3)

## 2017-12-18 LAB — BRAIN NATRIURETIC PEPTIDE: PRO B NATRI PEPTIDE: 175 pg/mL — AB (ref 0.0–100.0)

## 2017-12-18 MED ORDER — AMOXICILLIN 875 MG PO TABS: 875.0000 mg | ORAL_TABLET | Freq: Two times a day (BID) | ORAL | 0 refills | Status: DC

## 2017-12-18 MED ORDER — FLUTICASONE PROPIONATE (INHAL) 250 MCG/BLIST IN AEPB: INHALATION_SPRAY | RESPIRATORY_TRACT | Status: DC

## 2017-12-18 MED ORDER — HYDROCODONE-ACETAMINOPHEN 7.5-325 MG PO TABS: 1.0000 | ORAL_TABLET | Freq: Three times a day (TID) | ORAL | 0 refills | Status: DC | PRN

## 2017-12-18 NOTE — Patient Instructions (Signed)
Don't change your pain meds for now.  Try using the inhaler 2 puffs twice a day and see if that helps your breathing.  Go to the lab on the way out.  We'll contact you with your lab report. Plan on recheck in about 3 months.  Start amoxil in the meantime.  Take care.  Glad to see you.

## 2017-12-18 NOTE — Progress Notes (Signed)
She has had some mild SOB with pushing a shopping cart.  Going on for "awhile but I never mentioned it."  No BLE edema.  No CP.  Still on singulair, flovent and and flonase at baseline.  She hasn't had to use albuterol.  Some occ heartburn but off prevacid and tolerating that.  She has some occ wheeze, more when she is SOB.    URI sx, unrelated to the above: duration of symptoms: going on for a few weeks.   rhinorrhea:yes congestion:yes ear pain:no sore throat:no cough:some Myalgias:no No fevers.    Indication for chronic opioid: chronic L flank pain from shingles prev Medication and dose: hydrocodone 7.5mg  BID, sometimes sometimes 2.5-3 tabs per day   # pills per month: 90, occ used less in a month.   Last UDS date: 12/18/17 Pain contract signed (Y/N): yes Date narcotic database last reviewed (include red flags): 12/18/17  Pain inventory (1-10):  Average pain: 6/10 Pain now: 6/10 My pain is constant soreness that can get worse and flare up.  Pain is worse with flares of pain Relief from meds:yes  In the last 24 hours, how much has pain interfered with the following (1-10 greatest interference)? General activity: some limitation Relationships with others: no limitation.  Enjoyment of life- some limitation  What time of the day is the pain the worst, worse in the night and when tight clothes press on the area Sleep is described by patient as "pretty good."   Mobility/function Assistance device:no How many minutes can you walk: can [facility] with shopping, can go to multiple stores.   Able to climb steps:yes Driving:yes Disabled:no  Bowel or bladder symptoms:no Mood: at baseline. No recent changes.    Physicians involved in care: [facility] Any changes since last visit? No  Had seen pain clinic prev.  Injections didn't help.  Gabapentin didn't help.    Meds, vitals, and allergies reviewed.   ROS: Per HPI unless specifically indicated in ROS section    GEN: nad, alert and oriented HEENT: mucous membranes moist, tm w/o erythema, nasal exam w/o erythema, clear discharge noted,  OP with cobblestoning, max sinuses ttp B NECK: supple w/o LA CV: rrr.   PULM: ctab, no inc wob EXT: no edema Still with chronic L dermatomal pain on the trunk with altered sensation to light touch.

## 2017-12-19 DIAGNOSIS — R0602 Shortness of breath: Secondary | ICD-10-CM | POA: Insufficient documentation

## 2017-12-19 NOTE — Assessment & Plan Note (Signed)
Status post pain clinic injection without relief.  Failed treatment with gabapentin.  Still with chronic ongoing left-sided dermatomal pain on the trunk, with occasional severe flares of pain.  No adverse effect of medication.  Some relief with medication, to make the pain more tolerable.  Continue as is.  See above.  She agrees.  3 prescriptions printed for patient, for 14-month supply.  Recheck periodically.  Update me as needed. >25 minutes spent in face to face time with patient, >50% spent in counselling or coordination of care.

## 2017-12-19 NOTE — Assessment & Plan Note (Signed)
She does not have chest pain.  She does not appear to be in heart failure.  She has chronic exertional symptoms.  Could be related to asthma and/or relative deconditioning.  Check routine labs today.  Likely reasonable to increase her inhaled fluticasone in the meantime with routine cautions and see if that makes a difference in her symptoms.  She agrees.  She will update me as needed.  At this point okay for outpatient follow-up.

## 2017-12-19 NOTE — Assessment & Plan Note (Signed)
Likely unrelated to the other issues addressed today.  Start amoxicillin.  Supportive care.  Update me as needed.  She agrees.

## 2017-12-22 LAB — PAIN MGMT, PROFILE 8 W/CONF, U
6 ACETYLMORPHINE: NEGATIVE ng/mL (ref <10)
ALCOHOL METABOLITES: NEGATIVE ng/mL (ref <500)
ALPHAHYDROXYALPRAZOLAM: NEGATIVE ng/mL (ref <25)
AMINOCLONAZEPAM: NEGATIVE ng/mL (ref <25)
Alphahydroxymidazolam: NEGATIVE ng/mL (ref <50)
Alphahydroxytriazolam: NEGATIVE ng/mL (ref <50)
Amphetamines: NEGATIVE ng/mL (ref <500)
BUPRENORPHINE, URINE: NEGATIVE ng/mL (ref <5)
Benzodiazepines: POSITIVE ng/mL — AB (ref <100)
Cocaine Metabolite: NEGATIVE ng/mL (ref <150)
Codeine: NEGATIVE ng/mL (ref <50)
Creatinine: 175.8 mg/dL
HYDROCODONE: 3609 ng/mL — AB (ref <50)
HYDROMORPHONE: 908 ng/mL — AB (ref <50)
HYDROXYETHYLFLURAZEPAM: NEGATIVE ng/mL (ref <50)
Lorazepam: >1000 ng/mL — ABNORMAL HIGH (ref <50)
MARIJUANA METABOLITE: NEGATIVE ng/mL (ref <20)
MDMA: NEGATIVE ng/mL (ref <500)
Morphine: NEGATIVE ng/mL (ref <50)
NORDIAZEPAM: NEGATIVE ng/mL (ref <50)
Norhydrocodone: 4981 ng/mL — ABNORMAL HIGH (ref <50)
OPIATES: POSITIVE ng/mL — AB (ref <100)
Oxazepam: NEGATIVE ng/mL (ref <50)
Oxidant: NEGATIVE ug/mL (ref <200)
Oxycodone: NEGATIVE ng/mL (ref <100)
TEMAZEPAM: NEGATIVE ng/mL (ref <50)
pH: 6 (ref 4.5–9.0)

## 2017-12-24 DIAGNOSIS — L820 Inflamed seborrheic keratosis: Secondary | ICD-10-CM | POA: Diagnosis not present

## 2017-12-24 DIAGNOSIS — L821 Other seborrheic keratosis: Secondary | ICD-10-CM | POA: Diagnosis not present

## 2017-12-24 DIAGNOSIS — L570 Actinic keratosis: Secondary | ICD-10-CM | POA: Diagnosis not present

## 2017-12-24 DIAGNOSIS — Z85828 Personal history of other malignant neoplasm of skin: Secondary | ICD-10-CM | POA: Diagnosis not present

## 2017-12-30 ENCOUNTER — Other Ambulatory Visit: Payer: Self-pay | Admitting: Family Medicine

## 2018-01-08 ENCOUNTER — Other Ambulatory Visit: Payer: Self-pay | Admitting: Family Medicine

## 2018-01-08 NOTE — Telephone Encounter (Signed)
Sent.  I though she was only taking it at night, so I sent rx that way.   Update me if this isn't correct.  Thanks.

## 2018-01-08 NOTE — Telephone Encounter (Signed)
Spoke to patient by telephone and was advised that she is only taking the medication at night.

## 2018-01-08 NOTE — Telephone Encounter (Signed)
Last office visit 12/18/17 Refill request does not match the medication list. Please confirm dosage and directions

## 2018-01-26 ENCOUNTER — Encounter: Payer: Self-pay | Admitting: Family Medicine

## 2018-01-26 DIAGNOSIS — Z79891 Long term (current) use of opiate analgesic: Secondary | ICD-10-CM | POA: Insufficient documentation

## 2018-02-11 ENCOUNTER — Other Ambulatory Visit: Payer: Self-pay | Admitting: Cardiovascular Disease

## 2018-02-27 ENCOUNTER — Ambulatory Visit: Payer: Medicare HMO | Admitting: Cardiovascular Disease

## 2018-02-27 ENCOUNTER — Encounter: Payer: Self-pay | Admitting: Cardiovascular Disease

## 2018-02-27 VITALS — BP 122/70 | HR 80 | Ht 59.0 in | Wt 131.5 lb

## 2018-02-27 DIAGNOSIS — I493 Ventricular premature depolarization: Secondary | ICD-10-CM

## 2018-02-27 DIAGNOSIS — I10 Essential (primary) hypertension: Secondary | ICD-10-CM

## 2018-02-27 NOTE — Patient Instructions (Signed)
Medication Instructions: Continue same medications.   Labwork: None.   Procedures/Testing: None.   Follow-Up: 1 year with Dr. Mardel Grudzien.   Any Additional Special Instructions Will Be Listed Below (If Applicable).     If you need a refill on your cardiac medications before your next appointment, please call your pharmacy.   

## 2018-02-27 NOTE — Progress Notes (Signed)
Cardiology Office Note   Date:  02/27/2018   ID:  Jillian Oconnell, DOB Apr 18, 1934, MRN [id number]  PCP:  Tonia Ghent, MD  Cardiologist:   Kathlyn Sacramento, MD   Chief Complaint  Patient presents with  . other    6 month follow up. Meds reviewed by the pt. verbally. Pt. c/o shortness of breath with over exertion.       History of Present Illness: Jillian Oconnell is a 84 y.o. female who presents for a followup visit regarding palpitations with known history of sinus tachycardia, possible paroxysmal supraventricular tachycardia and frequent  PVCs.  She had previous cardiac catheterization in February of 2009 which showed minor luminal irregularities with no evidence of obstructive disease. Ejection fraction was normal. Most recent echocardiogram in 09/2016 showed normal LV systolic function, No significant valvular abnormalities and normal pulmonary pressure.   Most recent Holter monitor in October 2017 showed very frequent PVCs with a total of 34,000 beats representing 25% burden.   She improved significantly with metoprolol which was gradually increased.  She has been doing very well with no recent chest pain, shortness of breath or palpitations.  Past Medical History:  Diagnosis Date  . Allergy   . Anxiety   . Asthma   . Concussion    with fall at home 10/2016  . Depression   . GERD (gastroesophageal reflux disease)   . Glaucoma   . Hyperlipidemia   . Hypertension   . IBS (irritable bowel syndrome)   . OA (osteoarthritis) of knee    injections  . Osteopenia    DXA 2011  . Palpitations   . Paroxysmal supraventricular tachycardia ([place]) May of 2005  . 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 Shingles    post herpetic neuralgia (pain clinic in past)    Past Surgical History:  Procedure Laterality Date  . BREAST EXCISIONAL BIOPSY Right 1988   NEG  . CARDIAC CATHETERIZATION  01/2008   65% ;[place]. Minor luminal irregularities with no  evidence of obstructive disease.  . cataract surgery    . [place]  . RECTOCELE REPAIR  2004  . TOTAL ABDOMINAL HYSTERECTOMY       Current Outpatient Medications  Medication Sig Dispense Refill  . Calcium Carbonate-Vitamin D (CALTRATE 600+D) 600-400 MG-UNIT per tablet Take 1 tablet by mouth two times a day    . Fluticasone Propionate, Inhal, (FLOVENT DISKUS) 250 MCG/BLIST AEPB INHALE 2 PUFFS BY MOUTH IN THE AM, 2 PUFF IN THE PM, RINSE AFTER USE    . HYDROcodone-acetaminophen (NORCO) 7.5-325 MG tablet Take 1 tablet by mouth 3 (three) times daily as needed for moderate pain. 90 tablet 0  . loratadine (CLARITIN) 10 MG tablet TAKE ONE TABLET BY MOUTH ONCE DAILY 90 tablet 3  . LORazepam (ATIVAN) 1 MG tablet Take 0.5 tablets (0.5 mg total) by mouth at bedtime as needed for anxiety. 45 tablet 1  . metoprolol succinate (TOPROL-XL) 100 MG 24 hr tablet TAKE 1 TABLET BY MOUTH TWICE DAILY *TAKE  WITH  OR  IMMEDIATELY  FOLLOWING  A  MEAL* 180 tablet 0  . montelukast (SINGULAIR) 10 MG tablet TAKE 1 TABLET BY MOUTH AT BEDTIME 30 tablet 5  . Probiotic Product (PROBIOTIC PO) Take by mouth.     No current facility-administered medications for this visit.     Allergies:   Albuterol; Alendronate sodium; Amoxicillin-pot clavulanate; Atorvastatin; Azithromycin;  Ezetimibe-simvastatin; Gabapentin; Ibandronate sodium; Lexapro [escitalopram oxalate]; Risedronate sodium; Rosuvastatin; Sertraline; and Sulfonamide derivatives    Social History:  The patient  reports that  has never smoked. she has never used smokeless tobacco. She reports that she does not drink alcohol or use drugs.   Family History:  [name] family history includes Aneurysm in her father; Anxiety disorder in her daughter; Breast cancer in her daughter; Breast cancer (age of onset: 76) in her daughter; Breast cancer (age of onset: 83) in her mother; Cancer in her daughter and sister; Colon cancer in her sister; Hypertension in  her father and mother.    ROS:  Please see the history of present illness.   Otherwise, review of systems are positive for none.   All other systems are reviewed and negative.    PHYSICAL EXAM: VS:  BP 122/70 (BP Location: Left Arm, Patient Position: Sitting, Cuff Size: Normal)   Pulse 80   Ht 4\' 11"  (1.499 m)   Wt 131 lb 8 oz (59.6 kg)   BMI 26.56 kg/m  , BMI Body mass index is 26.56 kg/m. GEN: Well nourished, well developed, in no acute distress  HEENT: normal  Neck: no JVD, carotid bruits, or masses Cardiac: RRR ; no murmurs, rubs, or gallops,no edema  Respiratory:  clear to auscultation bilaterally, normal work of breathing GI: soft, nontender, nondistended, + BS MS: no deformity or atrophy  Skin: warm and dry, no rash Neuro:  Strength and sensation are intact Psych: euthymic mood, full affect   EKG:  EKG is  ordered today. EKG showed normal sinus rhythm with 1 PVC.  Otherwise normal.  Recent Labs: 12/18/2017: ALT 10; BUN 18; Creatinine, Ser 1.00; Hemoglobin 14.0; Platelets 281.0; Potassium 4.6; Pro B Natriuretic peptide (BNP) 175.0; Sodium 139; TSH 3.69    Lipid Panel    Component Value Date/Time   CHOL 243 (H) 01/06/2013 1157   TRIG 186.0 (H) 01/06/2013 1157   HDL 59.40 01/06/2013 1157   CHOLHDL 4 01/06/2013 1157   VLDL 37.2 01/06/2013 1157   LDLCALC 107 (H) 01/05/2010 1003   LDLDIRECT 148.1 01/06/2013 1157      Wt Readings from Last 3 Encounters:  02/27/18 131 lb 8 oz (59.6 kg)  12/18/17 129 lb 8 oz (58.7 kg)  11/21/17 126 lb 6.4 oz (57.3 kg)       No flowsheet data found.    ASSESSMENT AND PLAN:  1.  Symptomatic PVCs: She is doing very well on current dose of Toprol 100 mg once daily with no significant palpitations.  2. History of sinus tachycardia: Resolved with metoprolol.  3. Essential hypertension:  Blood pressure is reasonably controlled on current dose of metoprolol.  Disposition:   FU with me in 12 months  Signed,  Kathlyn Sacramento, MD   02/27/2018 2:11 PM    [facility]

## 2018-03-31 ENCOUNTER — Encounter: Payer: Self-pay | Admitting: Family Medicine

## 2018-03-31 ENCOUNTER — Ambulatory Visit (INDEPENDENT_AMBULATORY_CARE_PROVIDER_SITE_OTHER): Payer: Medicare HMO | Admitting: Family Medicine

## 2018-03-31 ENCOUNTER — Encounter: Payer: Self-pay | Admitting: *Deleted

## 2018-03-31 DIAGNOSIS — J4530 Mild persistent asthma, uncomplicated: Secondary | ICD-10-CM | POA: Diagnosis not present

## 2018-03-31 DIAGNOSIS — B0229 Other postherpetic nervous system involvement: Secondary | ICD-10-CM | POA: Diagnosis not present

## 2018-03-31 DIAGNOSIS — Z79891 Long term (current) use of opiate analgesic: Secondary | ICD-10-CM | POA: Diagnosis not present

## 2018-03-31 MED ORDER — ALBUTEROL SULFATE HFA 108 (90 BASE) MCG/ACT IN AERS: 1.0000 | INHALATION_SPRAY | Freq: Four times a day (QID) | RESPIRATORY_TRACT | 0 refills | Status: DC | PRN

## 2018-03-31 MED ORDER — FLUTICASONE PROPIONATE (INHAL) 250 MCG/BLIST IN AEPB
INHALATION_SPRAY | RESPIRATORY_TRACT | 12 refills | Status: DC
Start: 2018-03-31 — End: 2019-03-26

## 2018-03-31 MED ORDER — HYDROCODONE-ACETAMINOPHEN 7.5-325 MG PO TABS: 1.0000 | ORAL_TABLET | Freq: Three times a day (TID) | ORAL | 0 refills | Status: DC | PRN

## 2018-03-31 MED ORDER — FLUTICASONE PROPIONATE 50 MCG/ACT NA SUSP: 2.0000 | Freq: Every day | NASAL | 12 refills | Status: DC

## 2018-03-31 NOTE — Progress Notes (Signed)
She has had some mild SOB with pushing a shopping cart that continues.  Springtime allergies has been an issue recently.  No BLE edema.  No CP now or recently.  Still on singulair, flovent and and flonase at baseline.  She hasn't had to use albuterol.  She has some occ wheeze, more when she is SOB but she didn't want to use albuterol since that med made he feel jittery.  She has seen cards about PVCs and that is improved on metoprolol.  She was recently off flovent, for a few days.  She has some occ sputum production.    Indication for chronic opioid: chronic L flank pain from shingles prev Medication and dose: hydrocodone 7.5mg  BID, sometimes sometimes 2.5-3 tabs per day   # pills per month: 90, occ used less in a month.   Last UDS date: 12/18/17 Pain contract signed (Y/N): yes Date narcotic database last reviewed (include red flags): 03/31/18  Pain inventory (1-10):  Average pain: 6/10 Pain now: "not too back right now on med" My pain is constant that can get worse and flare up episodically, "Like a bee sting but also with somebody grabbing me."  Pain is worse with flares of pain Relief from meds:yes  In the last 24 hours, how much has pain interfered with the following (1-10 greatest interference)? General activity: some limitation "and I don't go like I used to, on trips."   Relationships with others: no limitation.  Enjoyment of life- some limitation  What time of the day is the pain the worst, worse in the night and when tight clothes press on the area Sleep is described by patient as "pretty good."   Mobility/function Assistance device:no How many minutes can you walk: can walk through [employer] with shopping, can go to multiple stores except as limited by SOB with exertion Able to climb steps:yes Driving:yes Disabled:no  Bowel or bladder symptoms:no Mood: at baseline. No recent changes.    Physicians involved in care: [facility] Any changes since last visit? No  Had  seen pain clinic prev.  Injections didn't help.  Gabapentin didn't help.    She was asking about a parking sticker and this is reasonable.    Meds, vitals, and allergies reviewed.   ROS: Per HPI unless specifically indicated in ROS section   GEN: nad, alert and oriented HEENT: mucous membranes moist NECK: supple w/o LA CV: rrr.   PULM: ctab, no inc wob, no wheeze.   EXT: no edema Still with chronic L dermatomal pain on the trunk with altered sensation to light touch.

## 2018-03-31 NOTE — Patient Instructions (Signed)
I realize the albuterol can make you jittery but try taking 1 puffy and see if that makes a difference in your breathing.  Either way, let me know.  Recheck in about 3 months.  Take care.  Glad to see you.

## 2018-04-01 NOTE — Assessment & Plan Note (Signed)
Indication for chronic opioid: chronic L flank pain from shingles prev Medication and dose: hydrocodone 7.5mg  BID, sometimes sometimes 2.5-3 tabs per day   # pills per month: 90, occ used less in a month.   Last UDS date: 12/18/17 Pain contract signed (Y/N): yes Date narcotic database last reviewed (include red flags): 03/31/18

## 2018-04-01 NOTE — Assessment & Plan Note (Signed)
Lungs clear.  Okay for outpatient follow-up.  This may be the issue with her shortness of breath especially since she has some occasional wheeze.  She has seen cardiology in the meantime.  I want her to try the albuterol one time just to see if it makes a significant difference in her symptoms.  That may end up being diagnostic and therapeutic.  She can update me as needed.  She agrees with plan.  Rationale discussed with patient.

## 2018-04-01 NOTE — Assessment & Plan Note (Signed)
Indication for chronic opioid: chronic L flank pain from shingles prev Medication and dose: hydrocodone 7.5mg  BID, sometimes sometimes 2.5-3 tabs per day   # pills per month: 90, occ used less in a month.   Last UDS date: 12/18/17 Pain contract signed (Y/N): yes Date narcotic database last reviewed (include red flags): 03/31/18 Continue medications as is.  Update me as needed.  No adverse effect on medications.  She agrees.

## 2018-04-11 ENCOUNTER — Other Ambulatory Visit: Payer: Self-pay | Admitting: Family Medicine

## 2018-04-11 DIAGNOSIS — Z1231 Encounter for screening mammogram for malignant neoplasm of breast: Secondary | ICD-10-CM

## 2018-04-15 ENCOUNTER — Ambulatory Visit: Payer: Self-pay | Admitting: *Deleted

## 2018-04-15 MED ORDER — PREGABALIN 25 MG PO CAPS: 25.0000 mg | ORAL_CAPSULE | Freq: Every day | ORAL | 0 refills | Status: DC

## 2018-04-15 MED ORDER — VALACYCLOVIR HCL 1 G PO TABS: 1000.0000 mg | ORAL_TABLET | Freq: Three times a day (TID) | ORAL | 0 refills | Status: DC

## 2018-04-15 NOTE — Telephone Encounter (Signed)
Patient advised.

## 2018-04-15 NOTE — Addendum Note (Signed)
Addended by: Tonia Ghent on: 04/15/2018 02:04 PM   Modules accepted: Orders

## 2018-04-15 NOTE — Telephone Encounter (Addendum)
Would start valtrex.  Rx sent.  Would try lyrica. rx sent.  Sedation caution.   Let me know how this goes for patient.  Thanks.

## 2018-04-15 NOTE — Telephone Encounter (Signed)
Pt last seen 03/31/18 for pain mgt.

## 2018-04-15 NOTE — Telephone Encounter (Signed)
I returned her call pertaining to a repeat shingles outbreak on her left side.  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    She is c/o a light pink rash that is itching and very painful just like the prior episode.   She was living in [place] with her prior outbreak.  She is requesting not to come in if possible as clothing touching her left side is very painful.  Since her symptoms are the same she was wanting to know if Dr. Damita Dunnings would be willing to call in something for the shingles.  She did mention she can not take gabapentin.   She has never tried Lyrica due to the expense as far as pain medication is concerned.   She is using her hydrocodone but it does not really help much.  I have routed a note to Dr. Damita Dunnings making him aware.  Reason for Disposition . [1] Shingles rash (matches SYMPTOMS) AND [2] weak immune system (e.g., HIV positive,  cancer chemotherapy, chronic steroid treatment, splenectomy) AND [3] NOT taking antiviral medication  Answer Assessment - Initial Assessment Questions 1. APPEARANCE of RASH: "Describe the rash."      Started Friday or Saturday.   I have the post nerve pain from Shingles from before.     2. LOCATION: "Where is the rash located?"      I have a little rash on my left side where I had shingles before. 3. ONSET: "When did the rash start?"      Friday or Saturday.    I took 1/2 Hydrocodone last night.  It helped a little then I took a whole pill later.   I'm still hurting bad. 4. ITCHING: "Does the rash itch?" If so, ask: "How bad is the itch?"  (Scale 1-10; or mild, moderate, severe)     It itches.   I rub it and not scratching it.    A light pink rash now.   5. PAIN: "Does the rash hurt?" If so, ask: "How bad is the pain?"  (Scale 1-10; or mild, moderate, severe)     Yes.   It hurts bad.      When I had shingles before I scratched too much.   I haven't had the shingles shot.   6. OTHER SYMPTOMS: "Do  you have any other symptoms?" (e.g., fever)     No fever now but yesterday maybe a little. 7. PREGNANCY: "Is there any chance you are pregnant?" "When was your last menstrual period?"     Not asked due age.  Protocols used: [facility]

## 2018-04-21 ENCOUNTER — Ambulatory Visit: Payer: Self-pay

## 2018-04-21 MED ORDER — PREGABALIN 25 MG PO CAPS: 50.0000 mg | ORAL_CAPSULE | Freq: Every day | ORAL | Status: DC

## 2018-04-21 MED ORDER — LIDOCAINE 5 % EX PTCH: 1.0000 | MEDICATED_PATCH | CUTANEOUS | 0 refills | Status: DC

## 2018-04-21 MED ORDER — PREGABALIN 25 MG PO CAPS: 50.0000 mg | ORAL_CAPSULE | Freq: Every day | ORAL | 1 refills | Status: DC

## 2018-04-21 NOTE — Telephone Encounter (Signed)
Pt. Reports has 1 day left of the Valtrex. Still has the pain associated with the shingles.Rates the pain "9".  Back and on the left. Rash is almost completely gone. The Lyrica helps her sleep "a little bit." What else can be done?  Answer Assessment - Initial Assessment Questions 1. APPEARANCE of RASH: "Describe the rash."      Rash is drying up 2. LOCATION: "Where is the rash located?"      Back - more on the left side 3. ONSET: "When did the rash start?"      Started 3 weeks ago 4. ITCHING: "Does the rash itch?" If so, ask: "How bad is the itch?"  (Scale 1-10; or mild, moderate, severe)     Moderate 5. PAIN: "Does the rash hurt?" If so, ask: "How bad is the pain?"  (Scale 1-10; or mild, moderate, severe)     9 6. OTHER SYMPTOMS: "Do you have any other symptoms?" (e.g., fever)     Not sleeping - Lyrica helps a little 7. PREGNANCY: "Is there any chance you are pregnant?" "When was your last menstrual period?"     No  Protocols used: [facility]

## 2018-04-21 NOTE — Telephone Encounter (Signed)
Done. Thanks.

## 2018-04-21 NOTE — Addendum Note (Signed)
Addended by: Tonia Ghent on: 04/21/2018 05:14 PM   Modules accepted: Orders

## 2018-04-21 NOTE — Telephone Encounter (Signed)
Would try to inc the lyrica.  Wouldn't extend the valtrex.  See order.  Try lidocaine patch.  rx sent. Update me in a few days with that.  Thanks.

## 2018-04-21 NOTE — Telephone Encounter (Signed)
Patient advised. Patient asks if she could get a new Rx for the Lyrica because she only got 15 of the 30 capsules to begin with so that she could see if she could take it.

## 2018-04-28 ENCOUNTER — Ambulatory Visit: Payer: Medicare HMO

## 2018-05-01 ENCOUNTER — Encounter: Payer: Medicare HMO | Admitting: Family Medicine

## 2018-05-09 ENCOUNTER — Other Ambulatory Visit: Payer: Self-pay | Admitting: Cardiovascular Disease

## 2018-06-24 ENCOUNTER — Ambulatory Visit
Admission: RE | Admit: 2018-06-24 | Discharge: 2018-06-24 | Disposition: A | Discharge status: Home / Self Care | Payer: Medicare HMO | Source: Ambulatory Visit | Attending: Family Medicine | Admitting: Family Medicine

## 2018-06-24 DIAGNOSIS — Z1231 Encounter for screening mammogram for malignant neoplasm of breast: Secondary | ICD-10-CM | POA: Diagnosis not present

## 2018-07-01 ENCOUNTER — Encounter: Payer: Self-pay | Admitting: Family Medicine

## 2018-07-01 ENCOUNTER — Ambulatory Visit (INDEPENDENT_AMBULATORY_CARE_PROVIDER_SITE_OTHER): Payer: Medicare HMO | Admitting: Family Medicine

## 2018-07-01 DIAGNOSIS — B0229 Other postherpetic nervous system involvement: Secondary | ICD-10-CM

## 2018-07-01 DIAGNOSIS — F411 Generalized anxiety disorder: Secondary | ICD-10-CM

## 2018-07-01 DIAGNOSIS — J4530 Mild persistent asthma, uncomplicated: Secondary | ICD-10-CM | POA: Diagnosis not present

## 2018-07-01 DIAGNOSIS — K649 Unspecified hemorrhoids: Secondary | ICD-10-CM | POA: Diagnosis not present

## 2018-07-01 MED ORDER — LIDOCAINE 2 % EX GEL: 1.0000 "application " | Freq: Every day | CUTANEOUS | 1 refills | Status: DC | PRN

## 2018-07-01 MED ORDER — MONTELUKAST SODIUM 10 MG PO TABS: 10.0000 mg | ORAL_TABLET | Freq: Every day | ORAL | 12 refills | Status: DC

## 2018-07-01 MED ORDER — LORAZEPAM 1 MG PO TABS: 0.5000 mg | ORAL_TABLET | Freq: Every evening | ORAL | 1 refills | Status: DC | PRN

## 2018-07-01 MED ORDER — HYDROCODONE-ACETAMINOPHEN 7.5-325 MG PO TABS: 1.0000 | ORAL_TABLET | Freq: Three times a day (TID) | ORAL | 0 refills | Status: DC | PRN

## 2018-07-01 NOTE — Patient Instructions (Signed)
Don't change your pain meds for now.  If you have more trouble with hemorrhoids, use lidocaine (for pain) or hydrocortisone cream (for itching).  Recheck in about 3 months like today.  Take care.  Glad to see you.

## 2018-07-01 NOTE — Progress Notes (Signed)
She had hemorrhoids.  Using prep H wipes.  Intermittent troubles, worse with constipation.  She has been increasing her fiber and that helps, along with fruit.  No bleeding, but can intermittently bulge out.  Can be uncomfortable.  She wanted to avoid surgery.  Asking about options.   Follow for chronic pain/shingles and asthma.  She couldn't afford the lidoderm patches.  She tried taking lyrica but she didn't tolerate it.  She had heart racing on the med.  It didn't help pain.  Allergy/intolerance list reviewed.    She is using flonase and flovent.  Still on singulair and needed refill.  She is better off with this combination of controller meds. She has rare use of SABA. She has recurrent L>R rhinorrhea at baseline, clear rhinorrhea.     She is living at home with her daughter and son in law at baseline.  They are helpful.  Her other daughters are doing okay.  She still misses her husband and she is still trying to adjust to that.  She hasn't been doing out as much recently.  She can still drive but hasn't felt much like going out.  She had to have her cat put down last week.  Discussed.    She had return of shingles on the L flank recently.  She had a new rash.  Then the pain got sig worse.    She is using BZD at night re: insomnia.  No ADE on med.  D/w pt about routine cautions.  Med helps with anxiety.    Indication for chronic opioid:chronic L flank pain from shingles prev Medication and dose:hydrocodone 7.5mg  BID, sometimes sometimes 2.5-3 tabs per day- higher use when she had flare of pain . # pills per month:90, occ used less in a month. Last UDS date:12/18/17 Pain contract signed (Y/N):yes Date narcotic database last reviewed (include red flags): 07/01/18  Pain inventory (1-10): Average pain:"hard to put a number on it" with fluctuating pain level Pain now:"not too bad now", some better middle of day.  My pain is constantthat can get worse and flare up episodically, "Like  a bee sting but also with somebody grabbing me."  Pain is worse with activity.   Relief from meds:yes  In the last 24 hours, how much has pain interfered with the following (1-10 greatest interference)? General activity: some limitation "and I don't go like I used to, on trips."   Relationships with others: no limitation now.  Enjoyment of life- some limitation now.   What time of the day is the pain the worst- worse in the night and when tight clothes press on the area Sleep is described by patient as"pretty good" with BZD use.    Mobility/function Assistance device:no How many minutes can you walk:can walk through [employer] with shopping Able to climb steps:yes Driving:yes Disabled:no  Bowel or bladder symptoms:no Mood:at baseline. No recent changes.  Physicians involved in [place] Any changes since last visit?No  Had seen pain clinic prev. Injections didn't help. Gabapentin didn't help.   Meds, vitals, and allergies reviewed.   ROS: Per HPI unless specifically indicated in ROS section  GEN: nad, alert and oriented HEENT: mucous membranes moist NECK: supple w/o LA CV: rrr.  PULM: ctab, no inc wob, no wheeze.   EXT: no edema Still with chronic L dermatomal pain on the trunk with altered sensation to light touch.

## 2018-07-02 DIAGNOSIS — K649 Unspecified hemorrhoids: Secondary | ICD-10-CM | POA: Insufficient documentation

## 2018-07-02 MED ORDER — HYDROCODONE-ACETAMINOPHEN 7.5-325 MG PO TABS: 1.0000 | ORAL_TABLET | Freq: Three times a day (TID) | ORAL | 0 refills | Status: DC | PRN

## 2018-07-02 NOTE — Assessment & Plan Note (Signed)
See avs. If more trouble with hemorrhoids, use lidocaine (for pain) or hydrocortisone cream (for itching).  Update me as needed.  Discussed with patient about fiber and preventing constipation.

## 2018-07-02 NOTE — Assessment & Plan Note (Signed)
Continue baseline medication.  Good control under normal circumstances with current meds.  No adverse effect.  Rare use of albuterol.

## 2018-07-02 NOTE — Assessment & Plan Note (Signed)
She is use lorazepam as needed at night without adverse effect.  No intolerance that would preclude use.  Routine cautions given, especially given her other medications.  She agrees.

## 2018-07-02 NOTE — Assessment & Plan Note (Signed)
Indication for chronic opioid:chronic L flank pain from shingles prev Medication and dose:hydrocodone 7.5mg  BID, sometimes sometimes 2.5-3 tabs per day- higher use when she had flare of pain . # pills per month:90, occ used less in a month. Last UDS date:12/18/17 Pain contract signed (Y/N):yes Date narcotic database last reviewed (include red flags): 07/01/18  She is tried and failed multiple options.  Her current medications allow her to have some improvement in her functional status and pain control.  No adverse effect other than constipation we talked about routine cautions for that.  Continue as is.  All questions answered.  Update me as needed.  She agrees. >40 minutes spent in face to face time with patient, >50% spent in counselling or coordination of care

## 2018-07-30 ENCOUNTER — Ambulatory Visit (INDEPENDENT_AMBULATORY_CARE_PROVIDER_SITE_OTHER): Payer: Medicare HMO

## 2018-07-30 ENCOUNTER — Ambulatory Visit: Payer: Medicare HMO

## 2018-07-30 VITALS — BP 130/80 | HR 74 | Temp 97.6°F | Ht <= 58 in | Wt 133.0 lb

## 2018-07-30 DIAGNOSIS — E2839 Other primary ovarian failure: Secondary | ICD-10-CM | POA: Diagnosis not present

## 2018-07-30 DIAGNOSIS — M810 Age-related osteoporosis without current pathological fracture: Secondary | ICD-10-CM

## 2018-07-30 DIAGNOSIS — Z0000 Encounter for general adult medical examination without abnormal findings: Secondary | ICD-10-CM | POA: Diagnosis not present

## 2018-07-30 LAB — VITAMIN D 25 HYDROXY (VIT D DEFICIENCY, FRACTURES): VITD: 68.27 ng/mL (ref 30.00–100.00)

## 2018-07-30 NOTE — Progress Notes (Signed)
PCP notes:   Health maintenance:  Flu vaccine - addressed  Abnormal screenings:   Hearing - failed  Hearing Screening   125Hz  250Hz  500Hz  1000Hz  2000Hz  3000Hz  4000Hz  6000Hz  8000Hz   Right ear:   0 0 0  0    Left ear:   40 0 0  0     Patient concerns:   Patient requested bone density referral. Referral submitted for PCP approval.   Nurse concerns:  None  Next PCP appt:   07/31/2018 @ 1600

## 2018-07-30 NOTE — Progress Notes (Signed)
Subjective:   Jillian Oconnell is a 84 y.o. female who presents for Medicare Annual (Subsequent) preventive examination.  Review of Systems:  N/A Cardiac Risk Factors include: advanced age (>54men, >36 women);hypertension     Objective:     Vitals: BP 130/80 (BP Location: Right Arm, Patient Position: Sitting, Cuff Size: Normal)   Pulse 74   Temp 97.6 F (36.4 C) (Oral)   Ht 4' 8.5" (1.435 m) Comment: no shoes  Wt 133 lb (60.3 kg)   SpO2 96%   BMI 29.29 kg/m   Body mass index is 29.29 kg/m.  Advanced Directives 07/30/2018 04/23/2017  Does Patient Have a Medical Advance Directive? No Yes  Type of Advance Directive - [place] in Chart? - No - copy requested  Would patient like information on creating a medical advance directive? Yes (MAU/Ambulatory/Procedural Areas - Information given) -    Tobacco Social History   Tobacco Use  Smoking Status Never Smoker  Smokeless Tobacco Never Used     Counseling given: No   Clinical Intake:  Pre-visit preparation completed: Yes  Pain Score: 5      Nutritional Status: BMI of 19-24  Normal Nutritional Risks: None Diabetes: No  How often do you need to have someone help you when you read instructions, pamphlets, or other written materials from your doctor or pharmacy?: 1 - Never What is the last grade level you completed in school?: 11th grade  Interpreter Needed?: No  Comments: pt is a widow and lives with extended family Information entered by :: LPinson, LPN  Past Medical History:  Diagnosis Date  . Allergy   . Anxiety   . Asthma   . Concussion    with fall at home 10/2016  . Depression   . GERD (gastroesophageal reflux disease)   . Glaucoma   . Hyperlipidemia   . Hypertension   . IBS (irritable bowel syndrome)   . OA (osteoarthritis) of knee    injections  . Osteopenia    DXA 2011  . Palpitations   . Paroxysmal supraventricular tachycardia ([place]) May  of 2005  . 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 Shingles    post herpetic neuralgia (pain clinic in past)   Past Surgical History:  Procedure Laterality Date  . BREAST EXCISIONAL BIOPSY Right 1988   NEG  . CARDIAC CATHETERIZATION  01/2008   65% ;[place]. Minor luminal irregularities with no evidence of obstructive disease.  . cataract surgery    . [place]  . RECTOCELE REPAIR  2004  . TOTAL ABDOMINAL HYSTERECTOMY     Family History  Problem Relation Age of Onset  . Hypertension Mother   . Breast cancer Mother 36  . Cancer Sister        colon  . Colon cancer Sister   . Hypertension Father   . Aneurysm Father        AAA  . Cancer Daughter        thyroid  . Anxiety disorder Daughter   . Breast cancer Daughter 100  . Breast cancer Daughter    Social History   Socioeconomic History  . Marital status: Widowed    Spouse name: Not on file  . Number of children: Not on file  . Years of education: Not on file  . Highest education level: Not on file  Occupational History  .  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 Financial resource strain: Not on file  . Food insecurity:    Worry: Not on file    Inability: Not on file  . Transportation needs:    Medical: Not on file    Non-medical: Not on file  Tobacco Use  . Smoking status: Never Smoker  . Smokeless tobacco: Never Used  Substance and Sexual Activity  . Alcohol use: No    Alcohol/week: 0.0 standard drinks  . Drug use: No  . Sexual activity: Not Currently    Birth control/protection: Post-menopausal  Lifestyle  . Physical activity:    Days per week: Not on file    Minutes per session: Not on file  . Stress: Not on file  Relationships  . Social connections:    Talks on phone: Not on file    Gets together: Not on file    Attends religious service: Not on file    Active member of club or organization: Not on file    Attends  meetings of clubs or organizations: Not on file    Relationship status: Not on file  Other Topics Concern  . Not on file  Social History Narrative   Regular exercise- no    Moved here from [place]- to be closer to family    Husband died of lung CA 04/10/2013    Outpatient Encounter Medications as of 07/30/2018  Medication Sig  . albuterol (PROVENTIL HFA;VENTOLIN HFA) 108 (90 Base) MCG/ACT inhaler Inhale 1-2 puffs into the lungs every 6 (six) hours as needed for wheezing or shortness of breath.  . Calcium Carbonate-Vitamin D (CALTRATE 600+D) 600-400 MG-UNIT per tablet Take 1 tablet by mouth two times a day  . fluticasone (FLONASE) 50 MCG/ACT nasal spray Place 2 sprays into both nostrils daily.  . Fluticasone Propionate, Inhal, (FLOVENT DISKUS) 250 MCG/BLIST AEPB INHALE 2 PUFFS BY MOUTH IN THE AM, 2 PUFF IN THE PM, RINSE AFTER USE  . HYDROcodone-acetaminophen (NORCO) 7.5-325 MG tablet Take 1 tablet by mouth 3 (three) times daily as needed (for pain). Fill on/after 08/30/18  . HYDROcodone-acetaminophen (NORCO) 7.5-325 MG tablet Take 1 tablet by mouth 3 (three) times daily as needed (for pain). Fill on/after 07/31/18  . Lidocaine 2 % GEL Apply 1 application topically daily as needed (for hemorrhoids if needed for pain).  Marland Kitchen loratadine (CLARITIN) 10 MG tablet TAKE ONE TABLET BY MOUTH ONCE DAILY  . LORazepam (ATIVAN) 1 MG tablet Take 0.5 tablets (0.5 mg total) by mouth at bedtime as needed for anxiety.  . metoprolol succinate (TOPROL-XL) 100 MG 24 hr tablet TAKE 1 TABLET BY MOUTH TWICE DAILY TAKE  WITH  OR  IMMEDIATELY  FOLLOWING  A  MEAL  . montelukast (SINGULAIR) 10 MG tablet Take 1 tablet (10 mg total) by mouth at bedtime.  . Probiotic Product (PROBIOTIC PO) Take by mouth.  . [DISCONTINUED] valACYclovir (VALTREX) 1000 MG tablet Take 1 tablet (1,000 mg total) by mouth 3 (three) times daily.   No facility-administered encounter medications on file as of 07/30/2018.     Activities of Daily Living In your  present state of health, do you have any difficulty performing the following activities: 07/30/2018  Hearing? Y  Vision? N  Difficulty concentrating or making decisions? N  Walking or climbing stairs? Y  Dressing or bathing? N  Doing errands, shopping? N  Preparing Food and eating ? N  Using the Toilet? N  In the  past six months, have you accidently leaked urine? Y  Do you have problems with loss of bowel control? Y  Managing your Medications? N  Managing your Finances? N  Housekeeping or managing your Housekeeping? N  Some recent data might be hidden    Patient Care Team: Tonia Ghent, MD as PCP - General (Family Medicine)    Assessment:   This is a routine wellness examination for Kuna.   Hearing Screening   125Hz  250Hz  500Hz  1000Hz  2000Hz  3000Hz  4000Hz  6000Hz  8000Hz   Right ear:   0 0 0  0    Left ear:   40 0 0  0    Vision Screening Comments: Vision exam in 2018   Exercise Activities and Dietary recommendations Current Exercise Habits: The patient does not participate in regular exercise at present, Exercise limited by: None identified  Goals    . Patient Stated     Starting 07/30/2018, I will continue to take medications as prescribed.        Fall Risk Fall Risk  07/30/2018 04/23/2017 02/21/2016  Falls in the past year? No Yes No  Comment - pt reports fall with injury; medical treatment provided by ER -  Number falls in past yr: - 1 -  Injury with Fall? - Yes -   Depression Screen PHQ 2/9 Scores 07/30/2018 04/23/2017 02/21/2016  PHQ - 2 Score 0 0 2  PHQ- 9 Score 0 - -     Cognitive Function MMSE - Mini Mental State Exam 07/30/2018 04/23/2017  Orientation to time 5 5  Orientation to Place 5 5  Registration 3 3  Attention/ Calculation 0 0  Recall 3 3  Language- name 2 objects 0 0  Language- repeat 1 1  Language- follow 3 step command 3 3  Language- read & follow direction 0 0  Write a sentence 0 0  Copy design 0 0  Total score 20 20     PLEASE NOTE: A  Mini-Cog screen was completed. Maximum score is 20. A value of 0 denotes this part of [place] MMSE was not completed or the patient failed this part of the Mini-Cog screening.   Mini-Cog Screening Orientation to Time - Max 5 pts Orientation to Place - Max 5 pts Registration - Max 3 pts Recall - Max 3 pts Language Repeat - Max 1 pts Language Follow 3 Step Command - Max 3 pts    Immunization History  Administered Date(s) Administered  . Influenza Split 11/18/2012  . Influenza Whole 10/03/2009, 09/29/2010  . Influenza,inj,Quad PF,6+ Mos 09/18/2013, 10/08/2014, 10/22/2016, 11/21/2017  . Influenza-Unspecified 10/21/2015  . Pneumococcal Conjugate-13 07/01/2015  . Pneumococcal Polysaccharide-23 12/17/2004  . Td 05/02/2012    Screening Tests Health Maintenance  Topic Date Due  . INFLUENZA VACCINE  03/17/2019 (Originally 07/17/2018)  . MAMMOGRAM  06/25/2019  . TETANUS/TDAP  05/02/2022  . DEXA SCAN  Completed  . PNA vac Low Risk Adult  Completed    Plan:     I have personally reviewed, addressed, and noted the following in the patient's chart:  A. Medical and social history B. Use of alcohol, tobacco or illicit drugs  C. Current medications and supplements D. Functional ability and status E.  Nutritional status F.  Physical activity G. Advance directives H. List of other physicians I.  Hospitalizations, surgeries, and ER visits in previous 12 months J.  [place] to include hearing, vision, cognitive, depression L. Referrals and appointments - none  In addition, I have reviewed and  discussed with patient certain preventive protocols, quality metrics, and best practice recommendations. A written personalized care plan for preventive services as well as general preventive health recommendations were provided to patient.  See attached scanned questionnaire for additional information.   Signed,   Lindell Noe, MHA, BS, LPN Health Coach

## 2018-07-30 NOTE — Patient Instructions (Signed)
Jillian Oconnell , Thank you for taking time to come for your Medicare Wellness Visit. I appreciate your ongoing commitment to your health goals. Please review the following plan we discussed and let me know if I can assist you in the future.   These are the goals we discussed: Goals    . Patient Stated     Starting 07/30/2018, I will continue to take medications as prescribed.        This is a list of the screening recommended for you and due dates:  Health Maintenance  Topic Date Due  . Flu Shot  03/17/2019*  . Mammogram  06/25/2019  . Tetanus Vaccine  05/02/2022  . DEXA scan (bone density measurement)  Completed  . Pneumonia vaccines  Completed  *Topic was postponed. The date shown is not the original due date.   Preventive Care for Adults  A healthy lifestyle and preventive care can promote health and wellness. Preventive health guidelines for adults include the following key practices.  . A routine yearly physical is a good way to check with your health care provider about your health and preventive screening. It is a chance to share any concerns and updates on your health and to receive a thorough exam.  . Visit your dentist for a routine exam and preventive care every 6 months. Brush your teeth twice a day and floss once a day. Good oral hygiene prevents tooth decay and gum disease.  . The frequency of eye exams is based on your age, health, family medical history, use  of contact lenses, and other factors. Follow your health care provider's recommendations for frequency of eye exams.  . Eat a healthy diet. Foods like vegetables, fruits, whole grains, low-fat dairy products, and lean protein foods contain the nutrients you need without too many calories. Decrease your intake of foods high in solid fats, added sugars, and salt. Eat the right amount of calories for you. Get information about a proper diet from your health care provider, if necessary.  . Regular physical exercise is one  of the most important things you can do for your health. Most adults should get at least 150 minutes of moderate-intensity exercise (any activity that increases your heart rate and causes you to sweat) each week. In addition, most adults need muscle-strengthening exercises on 2 or more days a week.  Silver Sneakers may be a benefit available to you. To determine eligibility, you may visit the website: www.silversneakers.com or contact program at 617-396-0205 Mon-Fri between 8AM-8PM.   . Maintain a healthy weight. The body mass index (BMI) is a screening tool to identify possible weight problems. It provides an estimate of body fat based on height and weight. Your health care provider can find your BMI and can help you achieve or maintain a healthy weight.   For adults 20 years and older: ? A BMI below 18.5 is considered underweight. ? A BMI of 18.5 to 24.9 is normal. ? A BMI of 25 to 29.9 is considered overweight. ? A BMI of 30 and above is considered obese.   . Maintain normal blood lipids and cholesterol levels by exercising and minimizing your intake of saturated fat. Eat a balanced diet with plenty of fruit and vegetables. Blood tests for lipids and cholesterol should begin at age 66 and be repeated every 5 years. If your lipid or cholesterol levels are high, you are over 50, or you are at high risk for heart disease, you may need your cholesterol levels  checked more frequently. Ongoing high lipid and cholesterol levels should be treated with medicines if diet and exercise are not working.  . If you smoke, find out from your health care provider how to quit. If you do not use tobacco, please do not start.  . If you choose to drink alcohol, please do not consume more than 2 drinks per day. One drink is considered to be 12 ounces (355 mL) of beer, 5 ounces (148 mL) of wine, or 1.5 ounces (44 mL) of liquor.  . If you are 53-51 years old, ask your health care provider if you should take aspirin  to prevent strokes.  . Use sunscreen. Apply sunscreen liberally and repeatedly throughout the day. You should seek shade when your shadow is shorter than you. Protect yourself by wearing long sleeves, pants, a wide-brimmed hat, and sunglasses year round, whenever you are outdoors.  . Once a month, do a whole body skin exam, using a mirror to look at the skin on your back. Tell your health care provider of new moles, moles that have irregular borders, moles that are larger than a pencil eraser, or moles that have changed in shape or color.

## 2018-07-31 ENCOUNTER — Ambulatory Visit (INDEPENDENT_AMBULATORY_CARE_PROVIDER_SITE_OTHER): Payer: Medicare HMO | Admitting: Family Medicine

## 2018-07-31 VITALS — BP 130/80 | HR 74 | Temp 97.6°F | Ht <= 58 in | Wt 133.0 lb

## 2018-07-31 DIAGNOSIS — F411 Generalized anxiety disorder: Secondary | ICD-10-CM | POA: Diagnosis not present

## 2018-07-31 DIAGNOSIS — Z79891 Long term (current) use of opiate analgesic: Secondary | ICD-10-CM

## 2018-07-31 DIAGNOSIS — Z7189 Other specified counseling: Secondary | ICD-10-CM

## 2018-07-31 DIAGNOSIS — Z0000 Encounter for general adult medical examination without abnormal findings: Secondary | ICD-10-CM

## 2018-07-31 DIAGNOSIS — J4530 Mild persistent asthma, uncomplicated: Secondary | ICD-10-CM | POA: Diagnosis not present

## 2018-07-31 DIAGNOSIS — J309 Allergic rhinitis, unspecified: Secondary | ICD-10-CM | POA: Diagnosis not present

## 2018-07-31 DIAGNOSIS — R002 Palpitations: Secondary | ICD-10-CM

## 2018-07-31 NOTE — Progress Notes (Signed)
Abnormal hearing screening.  Declined hearing aids at this point, d/w pt.  She isn't sure she would wear them.   DXA ordered.  D/w pt, she is awaiting appointment time.   Mammogram 2019.  Living will d/w pt. Daughter [name] designated if patient were incapacitated.   If patient had correctable issue, then she would accept ventilator, etc, to allow time to improve. She wouldn't want long term interventions.   If she can't get better, she desires to be comfortable.    Still on vicodin at baseline re: shingles pain.   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 Last UDS date:12/18/17 Pain contract signed (Y/N):yes Date narcotic database last reviewed (include red flags): 07/31/18  We may be able to decrease her tab per months in the future, d/w pt.   Asthma.  Still on flovent.  She has some variable sx with weather changes and heat.  Overall improved on med without any adverse effect.  She is still on singular, flonase for rhinitis.  With relief.  No adverse effect.  Still on metoprolol for HTN/PVCs.  No ADE on med.  Less PVCs on beta blocker.   Still with anxiety related to death of her husband and social stressors, d/w pt.  She has cut back on lorazepam, down to 1/2 tab per dose.  No ADE on med, used at night.    She is going to [place] to see her daughter for a few weeks this fall, d/w pt.    She is putting up with back pain at baseline. She is still doing some home chores at baseline. She is cooking less than prev.  D/w pt options to diversify her diet and get more veggies.  Her daughter is helping out with some meals.  D/w pt.    PMH and SH reviewed  ROS: Per HPI unless specifically indicated in ROS section   Meds, vitals, and allergies reviewed.   GEN: nad, alert and oriented HEENT: mucous membranes moist NECK:  supple w/o LA CV: rrr PULM: ctab, no inc wob ABD: soft, +bs EXT: no edema SKIN: no acute rash

## 2018-07-31 NOTE — Patient Instructions (Addendum)
If you don't hear about the bone density test appointment the next two weeks then let us know.     This is the way I have it listed on your chart: Daughter Dorethea Clan if you were incapacitated.   If you had a correctable issue, then use a ventilator, etc, to allow time to improve. You wouldn't want long term interventions.   If you can't get better, you desire to be comfortable.    Don't change your meds for now.  Recheck in about 3 months.  Update me as needed.  Take care.  Glad to see you.

## 2018-08-03 DIAGNOSIS — Z0000 Encounter for general adult medical examination without abnormal findings: Secondary | ICD-10-CM | POA: Insufficient documentation

## 2018-08-03 NOTE — Assessment & Plan Note (Signed)
Living will d/w pt. Daughter [name] designated if patient were incapacitated.   If patient had correctable issue, then she would accept ventilator, etc, to allow time to improve. She wouldn't want long term interventions.   If she can't get better, she desires to be comfortable.

## 2018-08-03 NOTE — Assessment & Plan Note (Signed)
Still with anxiety related to death of her husband and social stressors, d/w pt.  She has cut back on lorazepam, down to 1/2 tab per dose.  No ADE on med, used at night.   Continue as is.

## 2018-08-03 NOTE — Assessment & Plan Note (Signed)
Abnormal hearing screening.  Declined hearing aids at this point, d/w pt.  She isn't sure she would wear them.   DXA ordered.  D/w pt, she is awaiting appointment time.   Mammogram 2019.  Living will d/w pt. Daughter [name] designated if patient were incapacitated.   If patient had correctable issue, then she would accept ventilator, etc, to allow time to improve. She wouldn't want long term interventions.   If she can't get better, she desires to be comfortable.

## 2018-08-03 NOTE — Assessment & Plan Note (Signed)
Still on metoprolol for HTN/PVCs.  No ADE on med.  Less PVCs on beta blocker.  Continue as is.

## 2018-08-03 NOTE — Assessment & Plan Note (Signed)
She is still on singular, flonase for rhinitis.  With relief.  No adverse effect. Continue as is.

## 2018-08-03 NOTE — Assessment & Plan Note (Addendum)
Still on vicodin at baseline re: shingles pain.   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 Last UDS date:12/18/17 Pain contract signed (Y/N):yes Date narcotic database last reviewed (include red flags): 07/31/18  We may be able to decrease her tab per months in the future, d/w pt.   >25 minutes spent in face to face time with patient, >50% spent in counselling or coordination of care.

## 2018-08-03 NOTE — Assessment & Plan Note (Signed)
Still on flovent.  She has some variable sx with weather changes and heat.  Overall improved on med without any adverse effect.

## 2018-08-07 DIAGNOSIS — H353131 Nonexudative age-related macular degeneration, bilateral, early dry stage: Secondary | ICD-10-CM | POA: Diagnosis not present

## 2018-08-25 NOTE — Progress Notes (Signed)
I reviewed health advisor's note, was available for consultation, and agree with documentation and plan.  

## 2018-09-23 ENCOUNTER — Ambulatory Visit
Admission: RE | Admit: 2018-09-23 | Discharge: 2018-09-23 | Disposition: A | Discharge status: Home / Self Care | Payer: Medicare HMO | Source: Ambulatory Visit | Attending: Family Medicine | Admitting: Family Medicine

## 2018-09-23 DIAGNOSIS — E2839 Other primary ovarian failure: Secondary | ICD-10-CM | POA: Diagnosis not present

## 2018-09-23 DIAGNOSIS — M810 Age-related osteoporosis without current pathological fracture: Secondary | ICD-10-CM | POA: Diagnosis not present

## 2018-10-06 ENCOUNTER — Ambulatory Visit (INDEPENDENT_AMBULATORY_CARE_PROVIDER_SITE_OTHER): Payer: Medicare HMO | Admitting: Family Medicine

## 2018-10-06 ENCOUNTER — Encounter: Payer: Self-pay | Admitting: Family Medicine

## 2018-10-06 VITALS — BP 130/70 | HR 68 | Temp 97.9°F | Ht <= 58 in | Wt 129.5 lb

## 2018-10-06 DIAGNOSIS — K219 Gastro-esophageal reflux disease without esophagitis: Secondary | ICD-10-CM | POA: Diagnosis not present

## 2018-10-06 DIAGNOSIS — R300 Dysuria: Secondary | ICD-10-CM

## 2018-10-06 DIAGNOSIS — Z23 Encounter for immunization: Secondary | ICD-10-CM | POA: Diagnosis not present

## 2018-10-06 DIAGNOSIS — M810 Age-related osteoporosis without current pathological fracture: Secondary | ICD-10-CM | POA: Diagnosis not present

## 2018-10-06 LAB — POCT URINALYSIS DIPSTICK
BILIRUBIN UA: NEGATIVE
GLUCOSE UA: NEGATIVE
Ketones, UA: NEGATIVE
LEUKOCYTES UA: NEGATIVE
Nitrite, UA: NEGATIVE
Protein, UA: NEGATIVE
RBC UA: NEGATIVE
Spec Grav, UA: >=1.03 — AB (ref 1.010–1.025)
Urobilinogen, UA: 0.2 E.U./dL
pH, UA: 6 (ref 5.0–8.0)

## 2018-10-06 LAB — BASIC METABOLIC PANEL
BUN: 21 mg/dL (ref 6–23)
CO2: 34 mEq/L — ABNORMAL HIGH (ref 19–32)
CREATININE: 1.14 mg/dL (ref 0.40–1.20)
Calcium: 10.2 mg/dL (ref 8.4–10.5)
Chloride: 101 mEq/L (ref 96–112)
GFR: 48.52 mL/min — ABNORMAL LOW (ref >60.00)
GLUCOSE: 88 mg/dL (ref 70–99)
POTASSIUM: 5.7 meq/L — AB (ref 3.5–5.1)
Sodium: 140 mEq/L (ref 135–145)

## 2018-10-06 MED ORDER — LANSOPRAZOLE 30 MG PO CPDR: 30.0000 mg | DELAYED_RELEASE_CAPSULE | Freq: Every day | ORAL | 3 refills | Status: DC

## 2018-10-06 NOTE — Progress Notes (Signed)
She had trouble with oral bisphosphonates prev, with GI side effects.  She never had IV bisphosphonate treatment.    Prev vit D wnl.  D/w pt.    Prev Cr 1, with GFR 56  She had urine change with odor and some lower back pain.  Some burning with urination.    She has off prevacid and having more abd pain in the meantime.  No blood in stool.  No vomiting.  Burping frequently.  She did better on prevacid, compared to prilosec and zantac.    Meds, vitals, and allergies reviewed.   ROS: Per HPI unless specifically indicated in ROS section   GEN: nad, alert and oriented HEENT: mucous membranes moist NECK: supple w/o LA CV: rrr. PULM: ctab, no inc wob ABD: soft, +bs, suprapubic area nontender.  No rebound. EXT: no edema SKIN: no acute rash

## 2018-10-06 NOTE — Patient Instructions (Signed)
Go to the lab on the way out.  We'll contact you with your lab report. Restart prevacid and see if that helps with the abdominal discomfort and burping.  Keep taking vitamin D and calcium.  Take care.  Glad to see you.

## 2018-10-08 ENCOUNTER — Other Ambulatory Visit: Payer: Self-pay

## 2018-10-08 LAB — URINE CULTURE
MICRO NUMBER: 91262292
RESULT: NO GROWTH
SPECIMEN QUALITY: ADEQUATE

## 2018-10-08 NOTE — Telephone Encounter (Signed)
Copied from [place] [id number]. Topic: Quick Communication - Rx Refill/Question >> Oct 08, 2018  9:21 AM Carolyn Stare wrote: Medication  HYDROcodone-acetaminophen (NORCO) 7.5-325 MG tablet     Preferred [facility]   Agent: Please be advised that RX refills may take up to 3 business days. We ask that you follow-up with your pharmacy.

## 2018-10-08 NOTE — Telephone Encounter (Signed)
Name of Medication: Hydrocodone apap 7.5-325 Name of Pharmacy:[facility] [address]  Last Fill or Written Date and Quantity: # 38 on 07/02/18 Last Office Visit and Type: 10/06/18 for 30' OV Next Office Visit and Type: 08/10/19 annual Last Controlled Substance Agreement Date:12/18/17  Last UDS:12/18/17

## 2018-10-09 ENCOUNTER — Other Ambulatory Visit: Payer: Self-pay | Admitting: Family Medicine

## 2018-10-09 DIAGNOSIS — E875 Hyperkalemia: Secondary | ICD-10-CM

## 2018-10-09 MED ORDER — HYDROCODONE-ACETAMINOPHEN 7.5-325 MG PO TABS: 1.0000 | ORAL_TABLET | Freq: Three times a day (TID) | ORAL | 0 refills | Status: DC | PRN

## 2018-10-09 NOTE — Telephone Encounter (Signed)
Sent. Thanks.  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 Last UDS date:12/18/17 Pain contract signed (Y/N):yes Date narcotic database last reviewed (include red flags): 07/31/18

## 2018-10-13 ENCOUNTER — Other Ambulatory Visit (INDEPENDENT_AMBULATORY_CARE_PROVIDER_SITE_OTHER): Payer: Medicare HMO

## 2018-10-13 DIAGNOSIS — E875 Hyperkalemia: Secondary | ICD-10-CM

## 2018-10-13 LAB — BASIC METABOLIC PANEL
BUN: 23 mg/dL (ref 6–23)
CO2: 33 mEq/L — ABNORMAL HIGH (ref 19–32)
Calcium: 9.7 mg/dL (ref 8.4–10.5)
Chloride: 101 mEq/L (ref 96–112)
Creatinine, Ser: 1.23 mg/dL — ABNORMAL HIGH (ref 0.40–1.20)
GFR: 44.44 mL/min — AB (ref >60.00)
Glucose, Bld: 86 mg/dL (ref 70–99)
POTASSIUM: 4.9 meq/L (ref 3.5–5.1)
Sodium: 140 mEq/L (ref 135–145)

## 2018-10-13 NOTE — Assessment & Plan Note (Signed)
Restart Prevacid.  Update me as needed.

## 2018-10-13 NOTE — Assessment & Plan Note (Signed)
D/w pt about DXA results and osteoporosis path/phys in general, including vit D and calcium.  Reasonable to consider treatment with bisphosphonate, specifically IV form.  D/w pt about risk benefit, especially GI sx, jaw and long bone pathology.   We need to check her creatinine clearance to see if it will be reasonable to use a bisphosphonate.  All questions answered.  See notes on labs. >25 minutes spent in face to face time with patient, >50% spent in counselling or coordination of care.

## 2018-10-13 NOTE — Assessment & Plan Note (Signed)
See notes on labs. 

## 2018-10-19 ENCOUNTER — Other Ambulatory Visit: Payer: Self-pay | Admitting: Family Medicine

## 2018-10-20 ENCOUNTER — Telehealth: Payer: Self-pay

## 2018-10-20 MED ORDER — LOPERAMIDE HCL 2 MG PO TABS
2.0000 mg | ORAL_TABLET | Freq: Four times a day (QID) | ORAL | Status: DC | PRN
Start: 2018-10-20 — End: 2018-10-21

## 2018-10-20 NOTE — Telephone Encounter (Signed)
Pt called having diarrhea on and off for years; this episode of diarrhea started about 2 weeks ago; pt last seen 10/06/18. Pt has been using the [employer] and eating mostly rice, bananas and toast with no real relief from diarrhea. Last diarrhea was few mins ago with no formed stool but not watery, pt has accidents when she is not able to make it to the restroom and therefore pt cannot come in for appt. No fever and no real abd pain; occasionally abd may hurt small amt. Pt said she may have "gotten worked up" before the diarrhea started. Pt said had been given little white pill for diarrhea but pt was not sure name of med. Pt request cb after Dr Damita Dunnings reviews note; [facility] [address].

## 2018-10-20 NOTE — Telephone Encounter (Signed)
Patient advised and says she has tried Imodium and it doesn't help.  I asked if she had used it according to the directions as in using it after each loose BM not to exceed a certain amount and she said yes.  Please advise.     Also patient says she hasn't heard anything about her injections for osteoporosis.

## 2018-10-20 NOTE — Telephone Encounter (Signed)
I would try imodium in the meantime.  She can get that OTC. If that isn't helping the let us know.  Advise her to continue liberal fluid intake in the meantime.  Thanks.

## 2018-10-21 MED ORDER — DIPHENOXYLATE-ATROPINE 2.5-0.025 MG PO TABS: 1.0000 | ORAL_TABLET | Freq: Three times a day (TID) | ORAL | 0 refills | Status: DC | PRN

## 2018-10-21 NOTE — Telephone Encounter (Signed)
Would try lomotil.  rx sent.  If not better, then needs OV.  If passing mucous or having fevers then let me know.    I am checking on getting Reclast set up but we need to get the diarrhea addressed before she goes for that treatment.  Thanks.

## 2018-10-21 NOTE — Telephone Encounter (Signed)
Patient advised.

## 2018-11-10 ENCOUNTER — Encounter (HOSPITAL_COMMUNITY): Payer: Medicare HMO

## 2018-11-18 ENCOUNTER — Other Ambulatory Visit: Payer: Self-pay | Admitting: *Deleted

## 2018-11-18 NOTE — Telephone Encounter (Signed)
Name of Medication: Hydrocodone Last Fill or Written Date and Quantity:  90 tablet 0 10/09/2018  Last Office Visit and Type: 10/06/18 Osteoporosis Next Office Visit and Type: 08/06/2019 [facility] Last Controlled Substance Agreement Date: 11/09/13 Last UDS: 10/18/18

## 2018-11-19 MED ORDER — HYDROCODONE-ACETAMINOPHEN 7.5-325 MG PO TABS: 1.0000 | ORAL_TABLET | Freq: Three times a day (TID) | ORAL | 0 refills | Status: DC | PRN

## 2018-11-19 NOTE — Telephone Encounter (Signed)
Sent. Thanks.   

## 2018-12-25 ENCOUNTER — Ambulatory Visit (INDEPENDENT_AMBULATORY_CARE_PROVIDER_SITE_OTHER): Payer: Medicare HMO | Admitting: Family Medicine

## 2018-12-25 ENCOUNTER — Encounter: Payer: Self-pay | Admitting: Family Medicine

## 2018-12-25 VITALS — BP 128/78 | HR 72 | Temp 97.7°F | Wt 129.0 lb

## 2018-12-25 DIAGNOSIS — M810 Age-related osteoporosis without current pathological fracture: Secondary | ICD-10-CM

## 2018-12-25 DIAGNOSIS — M25511 Pain in right shoulder: Secondary | ICD-10-CM | POA: Diagnosis not present

## 2018-12-25 DIAGNOSIS — Z79891 Long term (current) use of opiate analgesic: Secondary | ICD-10-CM | POA: Diagnosis not present

## 2018-12-25 MED ORDER — HYDROCODONE-ACETAMINOPHEN 7.5-325 MG PO TABS: 1.0000 | ORAL_TABLET | Freq: Two times a day (BID) | ORAL | 0 refills | Status: DC | PRN

## 2018-12-25 MED ORDER — FLUTICASONE PROPIONATE 50 MCG/ACT NA SUSP: 2.0000 | Freq: Every day | NASAL | 12 refills | Status: DC

## 2018-12-25 NOTE — Patient Instructions (Signed)
Let me know when you'll be able to go to [facility] for the osteoporosis treatment.  Ask the front about seeing Dr. Lorelei Pont.  Change the pain medicine to twice a day.  Update me as needed.  Recheck in about 3 months.  Take care.  Glad to see you.

## 2018-12-25 NOTE — Assessment & Plan Note (Signed)
She couldn't get to [place] for IV bisphosphonate tx.  D/w pt.  Deferred for now, until she can get transportation lined up.  Her daughter had knee surgery and can't drive patient.

## 2018-12-25 NOTE — Assessment & Plan Note (Signed)
Indication for chronic opioid:chronic L flank pain from shingles prev Medication and dose:hydrocodone 7.5mg  BID # pills per month:60 Last UDS date:12/18/17 Pain contract signed (Y/N):yes Date narcotic database last reviewed (include red flags): 12/25/18   We can decrease her overall quantity per month.  Decreased to 60 per prescription.  Can take 1 tab twice a day.  Update me as needed.  She agrees.  >25 minutes spent in face to face time with patient, >50% spent in counselling or coordination of care.

## 2018-12-25 NOTE — Assessment & Plan Note (Signed)
She had previous injection done.  Discussed with her about options.  The concern is that she has recurrent rotator cuff symptoms.  I want her to follow-up with Dr. Lorelei Pont.  She will check on that.

## 2018-12-25 NOTE — Progress Notes (Signed)
Indication for chronic opioid:chronic L flank pain from shingles prev Medication and dose:hydrocodone 7.5mg  BID # pills per month:60 Last UDS date:12/18/17 Pain contract signed (Y/N):yes Date narcotic database last reviewed (include red flags): 12/25/18   We talked about tapering her med rx down to BID.  She has more pain in the evening but can tolerate BID dosing.     Pain inventory (1-10) Average pain:  Worse at night.   Pain now: 5/10 "not bad", meantime tolerable.   My pain is burning  Pain is worse at night  Relief from meds: yes  In the last 24 hours, how much has pain interfered with the following (1-10 greatest interference)? General activity- some limitation.  Relationships with others- some limitation but more limited by transportation.  She desires extra interaction with others less compared to years ago.   Enjoyment of life-affected more by death of her husband and concerns regarding other family members medical conditions. What time of the day is the pain the worst- at night.   Sleep is described by patient as 'good'  Mobility/function Assistance device:no How many minutes can you walk: as needed with pain.  Able to climb steps: yes Driving: locally Disabled: no  Bowel or bladder symptoms: no Mood: stable, "alright", discussed recent holidays.  Her father died on xmas day.    Physicians involved in care:no  Any changes since last visit? no  Using lomotil prn prev but it was too effective with resultant occasional constipation so she quit using it.  She couldn't get to [place] for IV bisphosphonate tx.  D/w pt.  Deferred for now, until she can get transportation lined up.  Her daughter had knee surgery and can't drive patient.    She is having more right shoulder pain recently.  She is previously been injected by Dr. Lorelei Pont.  Pain with overhead movement.  No new injury.  Pain along the lateral aspect of the proximal humerus.  Pain with range of motion. Sx going  on for 3-4 months.    Meds, vitals, and allergies reviewed.   ROS: Per HPI unless specifically indicated in ROS section   GEN: nad, alert and oriented HEENT: mucous membranes moist NECK: supple w/o LA CV: rrr PULM: ctab, no inc wob ABD: soft, +bs EXT: no edema SKIN: Well-perfused R shoulder with pain with overhead movement.  Pain on Ext>int rotation with impingement on testing. No arm drop Distally neurovascular intact with normal grip. Right AC joint nontender.

## 2018-12-28 NOTE — Progress Notes (Signed)
Dr. Frederico Hamman T. Doha Boling, MD, [facility] [place], [address] Phone: [phone] Fax: [phone]  01/01/2019  Patient: Jillian Oconnell, MRN: [id number], DOB: Apr 28, 1934, 85 y.o.  Primary Physician:  Tonia Ghent, MD   Chief Complaint  Patient presents with  . Shoulder Pain    Right   Subjective:   Jillian Oconnell is a 85 y.o. very pleasant female patient who presents with the following: shoulder pain  85 yo patient of Dr. Damita Dunnings who presents for R shoulder evaluation. She also is on chronic opioids for pain management. Hurt to abd, decreased strength.  2-3 months. No fx or surgery  The patient noted above presents with shoulder pain that has been ongoing for 2-3 mo. there is no history of trauma or accident. The patient denies neck pain or radicular symptoms. No shoulder blade pain Denies dislocation, subluxation, separation of the shoulder. The patient does complain of pain with flexion, abduction, and terminal motion.  Significant restriction of motion. she describes a deep ache around the shoulder, and sometimes it will wake the patient up at night.  Medications Tried: hydrocodone.  Ice or Heat: minimal help Tried PT: No  Prior shoulder Injury: not on R Prior surgery: not on R Prior fracture: on L  Past Medical History, Surgical History, Social History, Family History, Medications, and allergies reviewed and updated if relevant.   Patient Active Problem List   Diagnosis Date Noted  . Health care maintenance 08/03/2018  . Hemorrhoids 07/02/2018  . Chronic use of opiate for therapeutic purpose 01/26/2018  . SOB (shortness of breath) 12/19/2017  . History of UTI 09/20/2017  . Advance care planning 04/28/2017  . Cough 03/24/2017  . Hearing loss 03/24/2017  . Headache 01/24/2017  . Fall at home 10/28/2016  . Skin lesion 06/29/2016  . Lower back pain 03/28/2016  . Symptomatic PVCs 02/06/2016  .  Impingement syndrome of right shoulder 07/20/2015  . Breast cancer screening 04/28/2015  . Paroxysmal supraventricular tachycardia ([place])   . Pain in joint, shoulder region 11/09/2013  . Exertional chest pain 01/06/2013  . Back pain 10/12/2012  . Dysuria 08/10/2011  . CARPAL TUNNEL SYNDROME 01/09/2011  . Osteoporosis 09/06/2010  . PALPITATIONS 04/18/2010  . Depression 03/16/2009  . 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 HLD (hyperlipidemia) 02/23/2009  . Anxiety state 02/23/2009  . HTN (hypertension) 02/23/2009  . Allergic rhinitis 02/23/2009  . Asthma 02/23/2009  . GERD 02/23/2009    Past Medical History:  Diagnosis Date  . Allergy   . Anxiety   . Asthma   . Concussion    with fall at home 10/2016  . Depression   . GERD (gastroesophageal reflux disease)   . Glaucoma   . Hyperlipidemia   . Hypertension   . IBS (irritable bowel syndrome)   . OA (osteoarthritis) of knee    injections  . Osteopenia    DXA 2011  . Palpitations   . Paroxysmal supraventricular tachycardia ([place]) May of 2005  . 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 Shingles    post herpetic neuralgia (pain clinic in past)    Past Surgical History:  Procedure Laterality Date  . BREAST EXCISIONAL BIOPSY Right 1988   NEG  . CARDIAC CATHETERIZATION  01/2008   65% ;[place]. Minor luminal irregularities with no evidence of obstructive disease.  . cataract surgery    .  [place]  . RECTOCELE REPAIR  Apr 03, 2003  . TOTAL ABDOMINAL HYSTERECTOMY      Social History   Socioeconomic History  . Marital status: Widowed    Spouse name: Not on file  . Number of children: Not on file  . Years of education: Not on file  . Highest education level: Not on file  Occupational History  . 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 Financial resource strain: Not on file  . Food insecurity:    Worry: Not on file    Inability: Not on file  .  Transportation needs:    Medical: Not on file    Non-medical: Not on file  Tobacco Use  . Smoking status: Never Smoker  . Smokeless tobacco: Never Used  Substance and Sexual Activity  . Alcohol use: No    Alcohol/week: 0.0 standard drinks  . Drug use: No  . Sexual activity: Not Currently    Birth control/protection: Post-menopausal  Lifestyle  . Physical activity:    Days per week: Not on file    Minutes per session: Not on file  . Stress: Not on file  Relationships  . Social connections:    Talks on phone: Not on file    Gets together: Not on file    Attends religious service: Not on file    Active member of club or organization: Not on file    Attends meetings of clubs or organizations: Not on file    Relationship status: Not on file  . Intimate partner violence:    Fear of current or ex partner: Not on file    Emotionally abused: Not on file    Physically abused: Not on file    Forced sexual activity: Not on file  Other Topics Concern  . Not on file  Social History Narrative   Regular exercise- no    Moved here from [place]- to be closer to family    Husband died of lung CA 2013-04-02    Family History  Problem Relation Age of Onset  . Hypertension Mother   . Breast cancer Mother 109  . Cancer Sister        colon  . Colon cancer Sister   . Hypertension Father   . Aneurysm Father        AAA  . Cancer Daughter        thyroid  . Anxiety disorder Daughter   . Breast cancer Daughter 71  . Breast cancer Daughter     Allergies  Allergen Reactions  . Albuterol     REACTION: jittery  . Alendronate Sodium     REACTION: GI side eff  . Amoxicillin-Pot Clavulanate     REACTION: non tolerant- but can take plain amox  . Atorvastatin   . Azithromycin     GI upset.  Not an allergy.  . Ezetimibe-Simvastatin     REACTION: myalgia  . Gabapentin     REACTION: tremor  . Ibandronate Sodium     REACTION: GI side eff  . Lexapro [Escitalopram Oxalate]     sedation  . Lyrica  [Pregabalin] Other (See Comments)    heart racing and didn't help pain.   . Risedronate Sodium     REACTION: GI side eff  . Rosuvastatin     REACTION: myalgia  . Sertraline     sedation  . Sulfonamide Derivatives     REACTION: not tolerate  Medication list reviewed and updated in full in [facility].  GEN: No fevers, chills. Nontoxic. Primarily MSK c/o today. MSK: Detailed in the HPI GI: tolerating PO intake without difficulty Neuro: No numbness, parasthesias, or tingling associated. Otherwise the pertinent positives of the ROS are noted above.    Objective:   Blood pressure 120/90, pulse 79, temperature (!) 97.5 F (36.4 C), temperature source Oral, height 4' 8.5" (1.435 m), weight 130 lb (59 kg).   GEN: WDWN, NAD, Non-toxic, Alert & Oriented x 3 HEENT: Atraumatic, Normocephalic.  Ears and Nose: No external deformity. EXTR: No clubbing/cyanosis/edema NEURO: Normal gait.  PSYCH: Normally interactive. Conversant. Not depressed or anxious appearing.  Calm demeanor.   Shoulder: R and L Inspection: No muscle wasting or winging Ecchymosis/edema: neg  AC joint, scapula, clavicle: NT Cervical spine: NT, full ROM Spurling's: neg ABNORMAL SIDE TESTED: R CONTRALATERAL SIDE ALSO HAS DECREASED ROM POST TRAUMA YEARS AGO Abduction: 5/5, LIMITED TO 160 DEGREES Flexion: 5/5, LIMITED TO 165 DEGNO ROM  IR, lift-off: 5/5. TESTED AT 90 DEGREES OF ABDUCTION, LIMITED TO 45 DEGREES ER at neutral:  5/5, TESTED AT 90 DEGREES OF ABDUCTION, LIMITED TO 35 DEGREES AC crossover and compression: PAIN Drop Test: neg Empty Can: neg Supraspinatus insertion: NT Bicipital groove: NT ALL OTHER SPECIAL TESTING EQUIVOCAL GIVEN LOSS OF MOTION C5-T1 intact Sensation intact Grip 5/5   Assessment and Plan:   Adhesive capsulitis of right shoulder  Chronic right shoulder pain - Plan: DG Shoulder Right, DG Shoulder Right, methylPREDNISolone acetate (DEPO-MEDROL) injection 80 mg  >25 minutes  spent in face to face time with patient, >50% spent in counselling or coordination of care   Idiopathic adhesive capsulitis in this case.   Patient was given a systematic ROM protocol from [employer] to be done daily. Emphasized importance of adherence, help of PT, daily HEP.  The average length of total symptoms is 12-18 months going through 3 different phases in the freezing and thawing process. Reviewed all with patient.   Tylenol or Norco prn pain Intraarticular shoulder injections discussed with patient, which have good evidence for accelerating the thawing phase.  Patient will be sent for formal PT - if symptoms stall. Will need RTC str and scapular stabilization to fix underlying mechanics.  Intrarticular Shoulder Injection, R Date of procedure: 01/01/2019 Verbal consent was obtained from the patient. Risks including infection explained and contrasted with benefits and alternatives. Patient prepped with Chloraprep and Ethyl Chloride used for anesthesia. An intraarticular shoulder injection was performed using the posterior approach; needle placed into joint capsule without difficulty. The patient tolerated the procedure well and had decreased pain post injection. No complications. Injection: 8 cc of Lidocaine 1% and 2 mL Depo-Medrol 40 mg. Needle: 21 gauge, 2 inch   Follow-up: Return in about 10 weeks (around 03/12/2019).  Meds ordered this encounter  Medications  . methylPREDNISolone acetate (DEPO-MEDROL) injection 80 mg   Orders Placed This Encounter  Procedures  . DG Shoulder Right    Signed,  Frederico Hamman T. Jarelly Rinck, MD   Patient's Medications  New Prescriptions   No medications on file  Previous Medications   ALBUTEROL (PROVENTIL HFA;VENTOLIN HFA) 108 (90 BASE) MCG/ACT INHALER    Inhale 1-2 puffs into the lungs every 6 (six) hours as needed for wheezing or shortness of breath.   CALCIUM CARBONATE-VITAMIN D (CALTRATE 600+D) 600-400 MG-UNIT PER TABLET    Take 1 tablet by  mouth two times a day   FLUTICASONE (FLONASE) 50 MCG/ACT NASAL SPRAY  Place 2 sprays into both nostrils daily.   FLUTICASONE PROPIONATE, INHAL, (FLOVENT DISKUS) 250 MCG/BLIST AEPB    INHALE 2 PUFFS BY MOUTH IN THE AM, 2 PUFF IN THE PM, RINSE AFTER USE   HYDROCODONE-ACETAMINOPHEN (NORCO) 7.5-325 MG TABLET    Take 1 tablet by mouth 2 (two) times daily as needed (for pain).   HYDROCODONE-ACETAMINOPHEN (NORCO) 7.5-325 MG TABLET    Take 1 tablet by mouth 2 (two) times daily as needed for moderate pain. Fill on/after 02/22/2019   HYDROCODONE-ACETAMINOPHEN (NORCO) 7.5-325 MG TABLET    Take 1 tablet by mouth 2 (two) times daily as needed for moderate pain. Fill on/after 01/24/2019   LIDOCAINE 2 % GEL    Apply 1 application topically daily as needed (for hemorrhoids if needed for pain).   LORATADINE (CLARITIN) 10 MG TABLET    TAKE ONE TABLET BY MOUTH ONCE DAILY   LORAZEPAM (ATIVAN) 1 MG TABLET    Take 0.5 tablets (0.5 mg total) by mouth at bedtime as needed for anxiety.   METOPROLOL SUCCINATE (TOPROL-XL) 100 MG 24 HR TABLET    TAKE 1 TABLET BY MOUTH TWICE DAILY TAKE  WITH  OR  IMMEDIATELY  FOLLOWING  A  MEAL   MONTELUKAST (SINGULAIR) 10 MG TABLET    Take 1 tablet (10 mg total) by mouth at bedtime.   PROBIOTIC PRODUCT (PROBIOTIC PO)    Take by mouth.  Modified Medications   No medications on file  Discontinued Medications   No medications on file

## 2019-01-01 ENCOUNTER — Encounter: Payer: Self-pay | Admitting: Family Medicine

## 2019-01-01 ENCOUNTER — Ambulatory Visit (INDEPENDENT_AMBULATORY_CARE_PROVIDER_SITE_OTHER)
Admission: RE | Admit: 2019-01-01 | Discharge: 2019-01-01 | Disposition: A | Discharge status: Home / Self Care | Payer: Medicare HMO | Source: Ambulatory Visit | Attending: Family Medicine | Admitting: Family Medicine

## 2019-01-01 ENCOUNTER — Ambulatory Visit (INDEPENDENT_AMBULATORY_CARE_PROVIDER_SITE_OTHER): Payer: Medicare HMO | Admitting: Family Medicine

## 2019-01-01 VITALS — BP 120/90 | HR 79 | Temp 97.5°F | Ht <= 58 in | Wt 130.0 lb

## 2019-01-01 DIAGNOSIS — G8929 Other chronic pain: Secondary | ICD-10-CM

## 2019-01-01 DIAGNOSIS — M7501 Adhesive capsulitis of right shoulder: Secondary | ICD-10-CM

## 2019-01-01 DIAGNOSIS — M25511 Pain in right shoulder: Secondary | ICD-10-CM | POA: Diagnosis not present

## 2019-01-01 MED ORDER — METHYLPREDNISOLONE ACETATE 40 MG/ML IJ SUSP
80.0000 mg | Freq: Once | INTRAMUSCULAR | Status: AC
  Administered 2019-01-01: 80 mg via INTRA_ARTICULAR

## 2019-01-05 ENCOUNTER — Other Ambulatory Visit: Payer: Self-pay | Admitting: Family Medicine

## 2019-01-05 NOTE — Telephone Encounter (Signed)
Electronic refill request. Lorazepam Last office visit:   12/25/2018 Last Filled:    45 tablet 1 07/01/2018  Please advise.

## 2019-01-05 NOTE — Telephone Encounter (Signed)
Sent. Thanks.   

## 2019-01-17 IMAGING — DX DG CHEST 2V
2 series · 2 of 2 positions shown · non-contrast
Comparison: 04/17/2014

CLINICAL DATA: Cough.  Question pneumonia.

EXAM:
CHEST  2 VIEW

[chest pa]
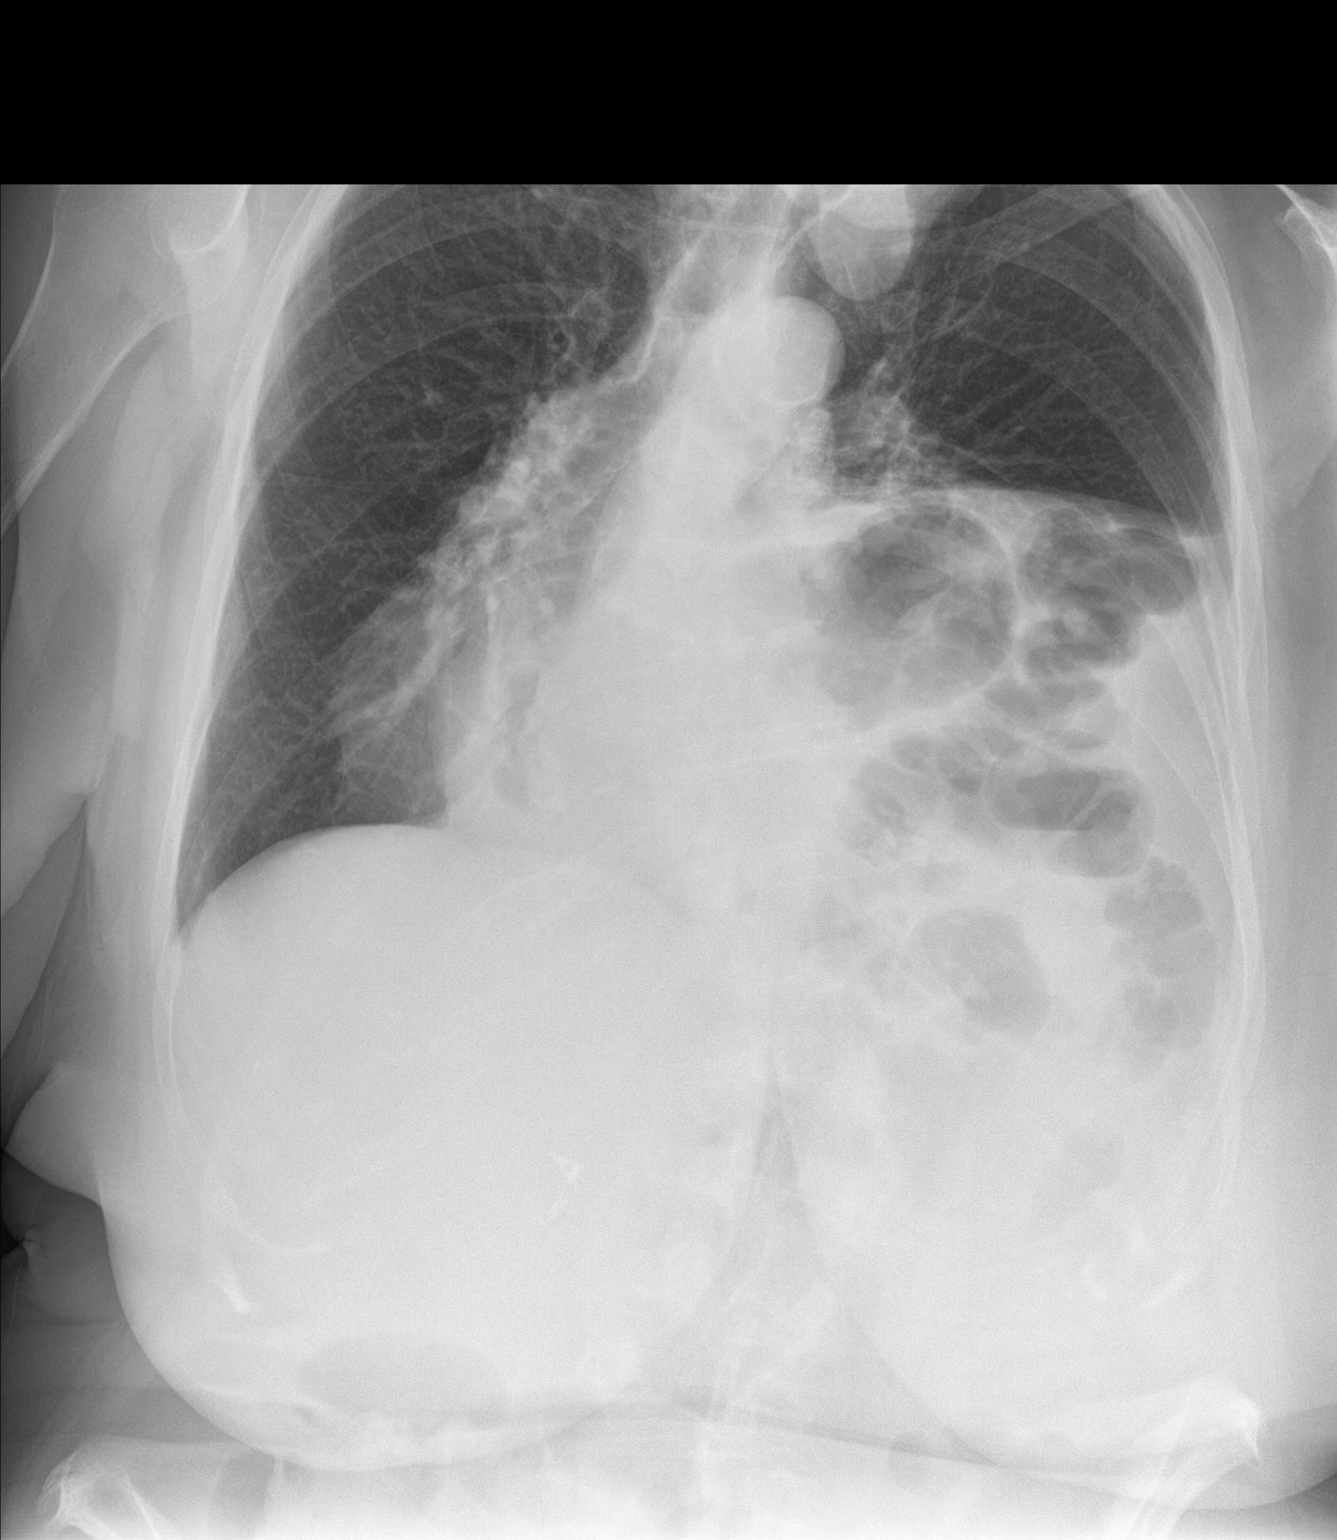

[chest lat]
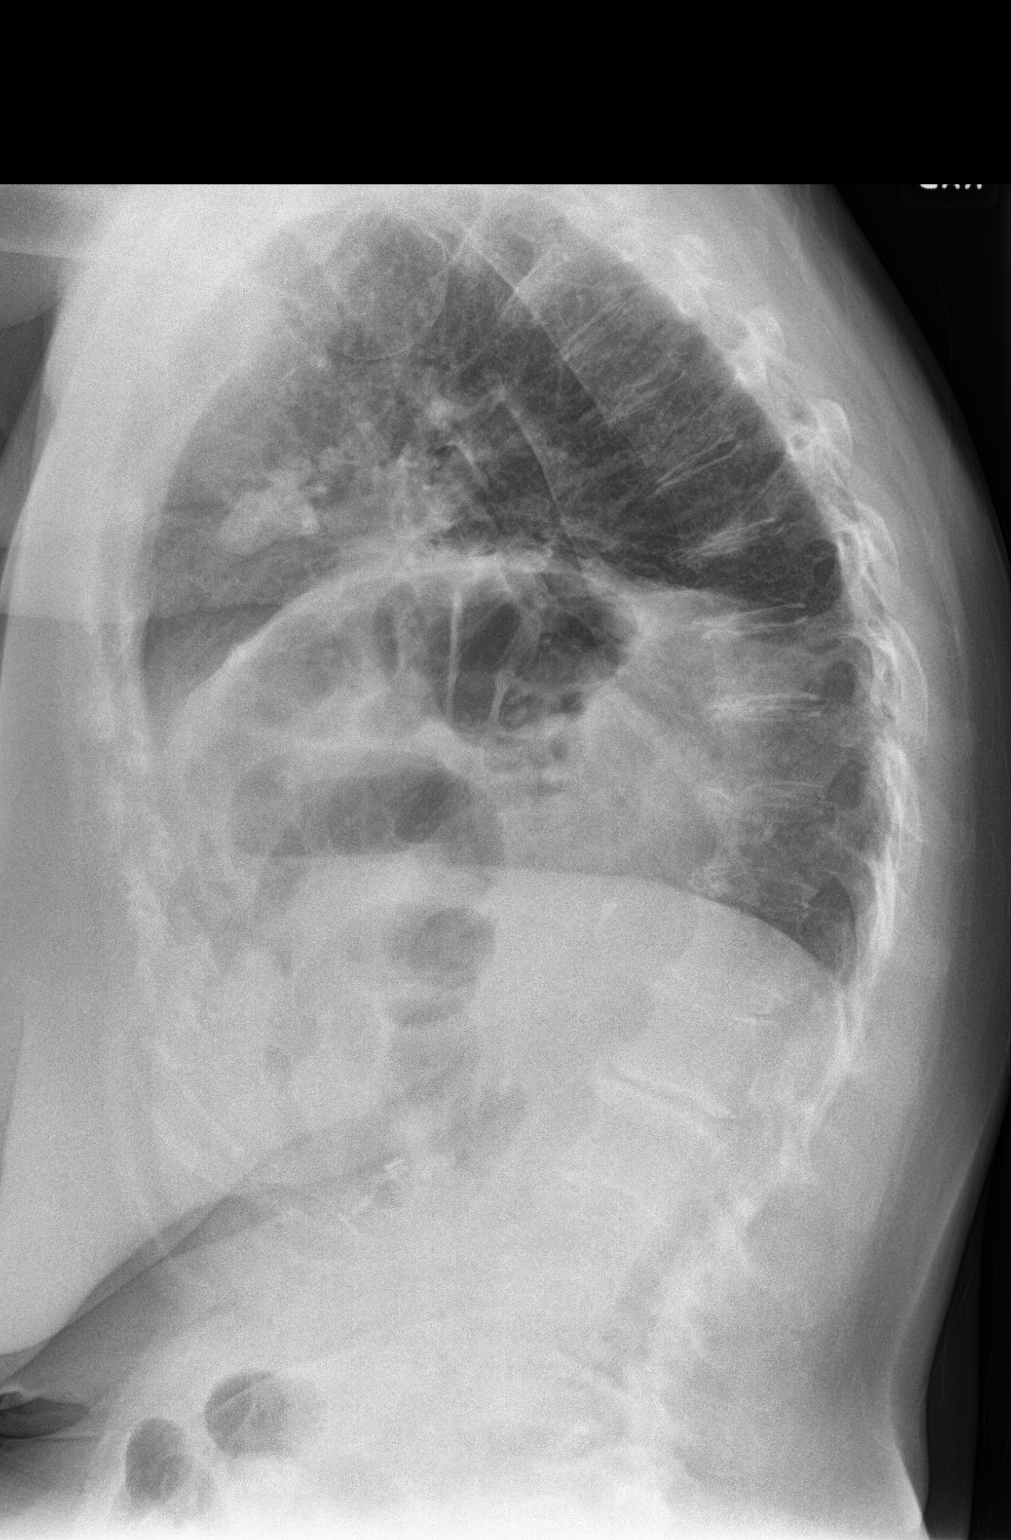

[2 of 2 positions shown; findings below may reference images not displayed]

FINDINGS: Chronic elevation of the left hemidiaphragm. Chronic cardiomegaly.
The right chest is clear. There is chronic volume loss in the left
lower lobe related to the elevated hemidiaphragm. This may be simple
atelectasis, but pneumonia in the left lower lobe cannot be
excluded. Chronic calcified hilar and mediastinal lymph nodes on the
left project over the heart on the lateral view.
IMPRESSION: Chronically elevated left hemidiaphragm. Chronic volume loss in the
left lower lobe. This may be simple atelectasis, but lower lobe
pneumonia is not excluded.

Chronic calcified left hilar and mediastinal lymph nodes.

## 2019-02-02 DIAGNOSIS — Z1283 Encounter for screening for malignant neoplasm of skin: Secondary | ICD-10-CM | POA: Diagnosis not present

## 2019-02-02 DIAGNOSIS — L812 Freckles: Secondary | ICD-10-CM | POA: Diagnosis not present

## 2019-02-02 DIAGNOSIS — I8393 Asymptomatic varicose veins of bilateral lower extremities: Secondary | ICD-10-CM | POA: Diagnosis not present

## 2019-02-02 DIAGNOSIS — L578 Other skin changes due to chronic exposure to nonionizing radiation: Secondary | ICD-10-CM | POA: Diagnosis not present

## 2019-02-02 DIAGNOSIS — L570 Actinic keratosis: Secondary | ICD-10-CM | POA: Diagnosis not present

## 2019-02-02 DIAGNOSIS — D692 Other nonthrombocytopenic purpura: Secondary | ICD-10-CM | POA: Diagnosis not present

## 2019-02-02 DIAGNOSIS — L2389 Allergic contact dermatitis due to other agents: Secondary | ICD-10-CM | POA: Diagnosis not present

## 2019-02-02 DIAGNOSIS — Z85828 Personal history of other malignant neoplasm of skin: Secondary | ICD-10-CM | POA: Diagnosis not present

## 2019-02-02 DIAGNOSIS — L821 Other seborrheic keratosis: Secondary | ICD-10-CM | POA: Diagnosis not present

## 2019-03-05 ENCOUNTER — Other Ambulatory Visit: Payer: Self-pay

## 2019-03-05 ENCOUNTER — Encounter: Payer: Self-pay | Admitting: Family Medicine

## 2019-03-05 ENCOUNTER — Telehealth: Payer: Self-pay

## 2019-03-05 ENCOUNTER — Ambulatory Visit (INDEPENDENT_AMBULATORY_CARE_PROVIDER_SITE_OTHER): Payer: Medicare HMO | Admitting: Family Medicine

## 2019-03-05 DIAGNOSIS — J0100 Acute maxillary sinusitis, unspecified: Secondary | ICD-10-CM

## 2019-03-05 MED ORDER — AMOXICILLIN 875 MG PO TABS: 875.0000 mg | ORAL_TABLET | Freq: Two times a day (BID) | ORAL | 0 refills | Status: AC

## 2019-03-05 NOTE — Progress Notes (Signed)
Sx for about 1 week.    Not as much cough but post nasal gtt with subsequent throat clearing.  translucent yellow rhinorrhea.  Gargling with salt water with some relief.  No fevers.  Some facial pain, esp leaning forward.  No ear pain.  No CP.  Not SOB.  No BLE edema.    Meds, vitals, and allergies reviewed.   ROS: Per HPI unless specifically indicated in ROS section   GEN: nad, alert and oriented HEENT: mucous membranes moist, tm w/o erythema, nasal exam w/o erythema, clear discharge noted,  OP with cobblestoning NECK: supple w/o LA CV: rrr.   PULM: ctab, no inc wob EXT: no edema SKIN: well perfused.  Sinuses ttp x4

## 2019-03-05 NOTE — Patient Instructions (Signed)
Likely a sinus infection.  Start amoxil.  Rest and fluids.  Update me as needed.  Take care.  Glad to see you.

## 2019-03-05 NOTE — Telephone Encounter (Signed)
Pt said for 1 wk has had prod cough with yellow phlegm,H/A, head congestion but runny nose. Pt has not traveled and no exposure to know corona virus or flu individual. Pt scheduled appt to see Dr Damita Dunnings 03/05/19 at 3:30. Pt has been taking claritin and singulair. FYI to Dr Damita Dunnings.

## 2019-03-05 NOTE — Telephone Encounter (Signed)
See OV note.  Thanks.  

## 2019-03-08 NOTE — Assessment & Plan Note (Signed)
Nontoxic.  Okay for outpatient follow-up Likely a sinus infection.  Start amoxil.  Rest and fluids.  Update me as needed.  She agrees to plan.

## 2019-03-26 ENCOUNTER — Ambulatory Visit (INDEPENDENT_AMBULATORY_CARE_PROVIDER_SITE_OTHER): Payer: Medicare HMO | Admitting: Family Medicine

## 2019-03-26 DIAGNOSIS — Z79891 Long term (current) use of opiate analgesic: Secondary | ICD-10-CM

## 2019-03-26 MED ORDER — HYDROCODONE-ACETAMINOPHEN 7.5-325 MG PO TABS: 1.0000 | ORAL_TABLET | Freq: Two times a day (BID) | ORAL | 0 refills | Status: DC | PRN

## 2019-03-26 MED ORDER — LORAZEPAM 1 MG PO TABS: ORAL_TABLET | ORAL | 0 refills | Status: DC

## 2019-03-26 MED ORDER — FLUTICASONE PROPIONATE (INHAL) 250 MCG/BLIST IN AEPB: INHALATION_SPRAY | RESPIRATORY_TRACT | 12 refills | Status: DC

## 2019-03-26 NOTE — Progress Notes (Signed)
Virtual visit completed through WebEx.  Patient location: home  Provider location:  at [facility], office   Her prev cough is better.  She is putting up with seasonal pollen exposure.  No fevers.    Indication for chronic opioid:chronic L flank pain from shingles prev Medication and dose:hydrocodone 7.5mg  BID # pills per month:60 Last UDS date:12/18/17 Pain contract signed (Y/N):yes Date narcotic database last reviewed (include red flags):03/26/19  We talked about tapering her med rx down to BID, she had tapered prev but didn't get below 2 tabs a day.  She has more pain in the evening but can tolerate BID dosing.    Pain inventory (1-10) Average pain:  Worse at night.   Pain now: not too bad.   My pain is burning, grabbing, pinching.   Pain is worse at night  Relief from meds: yes  In the last 24 hours, how much has pain interfered with the following (1-10 greatest interference)? General activity- still with some limitation from pain. she is trying to be active Relationships with others- some limitation but more limited by transportation and recent pandemic.  She desires extra interaction with others less compared to years ago.   Enjoyment of life-still affected more by death of her husband and concerns regarding other family members medical conditions. What time of the day is the pain the worst- at night.   Sleep is described by patient as 'good'  Mobility/function Assistance device:no How many minutes can you walk: as needed with pain.  Able to climb steps: yes Driving: locally Disabled: no  Bowel or bladder symptoms: no Mood: stable, "alright", "going one day at a time."   Physicians involved in care:no  Any changes since last visit? no  She is using a probiotic, align, with relief.    She couldn't get to [place] for IV bisphosphonate tx.  D/w pt.  Deferred for now, given pandemic  Her shoulder pain is better.  She has some triggering of the L  thumb and she is putting up with that.  She can call about seeing Dr. Lorelei Pont if needed, she'll consider.    Meds and allergies reviewed.   ROS: Per HPI unless specifically indicated in ROS section   NAD Speaking in complete sentences.    A/P:  Chronic pain.  Indication for chronic opioid:chronic L flank pain from shingles prev Medication and dose:hydrocodone 7.5mg  BID # pills per month:60 Last UDS date:12/18/17 Pain contract signed (Y/N):yes Date narcotic database last reviewed (include red flags):03/26/19  Reasonable to continue as is with her current medication.  She is able to manage her pain without obvious adverse effect and it is likely at this point that she does get enough benefit to make continued use worthwhile.  Discussed with patient.  She agrees. Plan on recheck in 3 months.   No change in meds. rxs sent.

## 2019-03-29 NOTE — Assessment & Plan Note (Signed)
Chronic pain.  Indication for chronic opioid:chronic L flank pain from shingles prev Medication and dose:hydrocodone 7.5mg  BID # pills per month:60 Last UDS date:12/18/17 Pain contract signed (Y/N):yes Date narcotic database last reviewed (include red flags):03/26/19  Reasonable to continue as is with her current medication.  She is able to manage her pain without obvious adverse effect and it is likely at this point that she does get enough benefit to make continued use worthwhile.  Discussed with patient.  She agrees. Plan on recheck in 3 months.   No change in meds. rxs sent.

## 2019-04-15 ENCOUNTER — Telehealth: Payer: Self-pay | Admitting: *Deleted

## 2019-04-15 MED ORDER — FAMOTIDINE 20 MG PO TABS: 20.0000 mg | ORAL_TABLET | Freq: Two times a day (BID) | ORAL | 1 refills | Status: DC

## 2019-04-15 NOTE — Telephone Encounter (Signed)
Patient called stating that she is having acid reflux. Patient stated that she was on Prevacid for a while and it caused diarrhea. Patient stated that you had talked with her about trying something different and never proceeded with that. Patient requested that something be sent to the pharmacy for her acid reflux. Pharmacy [facility]

## 2019-04-15 NOTE — Telephone Encounter (Signed)
Would try pepcid.  rx sent.  Thanks. Update [place] as needed.

## 2019-04-16 NOTE — Telephone Encounter (Signed)
Spoke to pt

## 2019-05-05 DIAGNOSIS — H353131 Nonexudative age-related macular degeneration, bilateral, early dry stage: Secondary | ICD-10-CM | POA: Diagnosis not present

## 2019-05-06 ENCOUNTER — Other Ambulatory Visit: Payer: Self-pay | Admitting: Cardiovascular Disease

## 2019-05-19 ENCOUNTER — Ambulatory Visit (INDEPENDENT_AMBULATORY_CARE_PROVIDER_SITE_OTHER): Payer: Medicare HMO | Admitting: Family Medicine

## 2019-05-19 ENCOUNTER — Encounter: Payer: Self-pay | Admitting: Family Medicine

## 2019-05-19 VITALS — Wt 130.0 lb

## 2019-05-19 DIAGNOSIS — L989 Disorder of the skin and subcutaneous tissue, unspecified: Secondary | ICD-10-CM | POA: Diagnosis not present

## 2019-05-19 DIAGNOSIS — K219 Gastro-esophageal reflux disease without esophagitis: Secondary | ICD-10-CM

## 2019-05-19 MED ORDER — TRIAMCINOLONE ACETONIDE 0.1 % EX CREA: 1.0000 "application " | TOPICAL_CREAM | Freq: Two times a day (BID) | CUTANEOUS | 0 refills | Status: DC

## 2019-05-19 MED ORDER — OMEPRAZOLE 20 MG PO CPDR: 20.0000 mg | DELAYED_RELEASE_CAPSULE | Freq: Every day | ORAL | 3 refills | Status: DC

## 2019-05-19 NOTE — Progress Notes (Signed)
Virtual visit completed through WebEx or similar program Patient location: home  Provider location: Financial controller at [facility], office   Pandemic considerations d/w pt.   Limitations and rationale for visit method d/w patient.  Patient agreed to proceed.   CC: skin lesion.   HPI: Noted this AM.  It doesn't feel warm to patient's daughter but does so to patient.  It itches.  A little painful to the touch but not when sitting down.  Some pain when up and walking.  No FCNAVD but the pain is not severe.  On the L shin.  No drainage.  Not red but a little pinkish per patient and family report.    Pepcid didn't help with GERD.  Discussed options.  Meds and allergies reviewed.   ROS: Per HPI unless specifically indicated in ROS section   NAD Speech wnl Family measured max diameter and that was 1/2" wide.  Small puffy area noted on camera.   Does not appear to have spreading erythema or drainage.  A/P:  GERD.  Pepcid didn't help with GERD.  Discussed options. Can try omeprazole 20mg  a day.    Skin lesion.  This could have been an insect bite.  It does not look ominous. It doesn't look infected.  She can use hydrocortisone cream.  If needed she can spray flonase on the spot twice a day if needed for 1-2 days.  She can change to TAC if needed.  rx sent.  Update me as needed.

## 2019-05-20 ENCOUNTER — Telehealth: Payer: Self-pay

## 2019-05-20 NOTE — Assessment & Plan Note (Signed)
Pepcid didn't help with GERD.  Discussed options. Can try omeprazole 20mg  a day.

## 2019-05-20 NOTE — Assessment & Plan Note (Signed)
This could have been an insect bite.  It does not look ominous. It doesn't look infected.  She can use hydrocortisone cream.  If needed she can spray flonase on the spot twice a day if needed for 1-2 days.  She can change to TAC if needed.  rx sent.  Update me as needed.

## 2019-05-20 NOTE — Telephone Encounter (Signed)
Virtual Visit Pre-Appointment Phone Call  "Jillian Oconnell, I am calling you today to discuss your upcoming appointment. We are currently trying to limit exposure to the virus that causes COVID-19 by seeing patients at home rather than in the office."  1. "What is the BEST phone number to call the day of the visit?" - include this in appointment notes  2. "Do you have or have access to (through a family member/friend) a smartphone with video capability that we can use for your visit?" a. If yes - list this number in appt notes as "cell" (if different from BEST phone #) and list the appointment type as a VIDEO visit in appointment notes b. If no - list the appointment type as a PHONE visit in appointment notes  3. Confirm consent - "In the setting of the current Covid19 crisis, you are scheduled for a video visit with your provider on 06/26/2019 at 3:40PM.  Just as we do with many in-office visits, in order for you to participate in this visit, we must obtain consent.  If you'd like, I can send this to your mychart (if signed up) or email for you to review.  Otherwise, I can obtain your verbal consent now.  All virtual visits are billed to your insurance company just like a normal visit would be.  By agreeing to a virtual visit, we'd like you to understand that the technology does not allow for your provider to perform an examination, and thus may limit your provider's ability to fully assess your condition. If your provider identifies any concerns that need to be evaluated in person, we will make arrangements to do so.  Finally, though the technology is pretty good, we cannot assure that it will always work on either your or our end, and in the setting of a video visit, we may have to convert it to a phone-only visit.  In either situation, we cannot ensure that we have a secure connection.  Are you willing to proceed?" STAFF: Did the patient verbally acknowledge consent to telehealth visit? Document YES/NO  here: YES  4. Advise patient to be prepared - "Two hours prior to your appointment, go ahead and check your blood pressure, pulse, oxygen saturation, and your weight (if you have the equipment to check those) and write them all down. When your visit starts, your provider will ask you for this information. If you have an Apple Watch or Kardia device, please plan to have heart rate information ready on the day of your appointment. Please have a pen and paper handy nearby the day of the visit as well."  5. Give patient instructions for MyChart download to smartphone OR Doximity/Doxy.me as below if video visit (depending on what platform provider is using)  6. Inform patient they will receive a phone call 15 minutes prior to their appointment time (may be from unknown caller ID) so they should be prepared to answer    TELEPHONE CALL NOTE  Jillian Oconnell has been deemed a candidate for a follow-up tele-health visit to limit community exposure during the Covid-19 pandemic. I spoke with the patient via phone to ensure availability of phone/video source, confirm preferred email & phone number, and discuss instructions and expectations.  I reminded Jillian Oconnell to be prepared with any vital sign and/or heart rhythm information that could potentially be obtained via home monitoring, at the time of her visit. I reminded Jillian Oconnell to expect a phone call prior to her visit.  Jillian Oconnell 05/20/2019 8:53 AM   INSTRUCTIONS FOR DOWNLOADING THE MYCHART APP TO SMARTPHONE  - The patient must first make sure to have activated MyChart and know their login information - If Apple, go to [employer] and type in MyChart in the search bar and download the app. If Android, ask patient to go to [employer] and type in [place] in the search bar and download the app. The app is free but as with any other app downloads, their phone may require them to verify saved payment information or Apple/Android  password.  - The patient will need to then log into the app with their MyChart username and password, and select [facility] as their healthcare provider to link the account. When it is time for your visit, go to the MyChart app, find appointments, and click Begin Video Visit. Be sure to Select Allow for your device to access the Microphone and Camera for your visit. You will then be connected, and your provider will be with you shortly.  **If they have any issues connecting, or need assistance please contact MyChart service desk [phone])**  **If using a computer, in order to ensure the best quality for their visit they will need to use either of the following Internet Browsers: [employer], or Google Chrome**  IF USING DOXIMITY or DOXY.ME - The patient will receive a link just prior to their visit by text.     FULL LENGTH CONSENT FOR TELE-HEALTH VISIT   I hereby voluntarily request, consent and authorize [facility] and its employed or contracted physicians, physician assistants, nurse practitioners or other licensed health care professionals (the Practitioner), to provide me with telemedicine health care services (the "Services") as deemed necessary by the treating Practitioner. I acknowledge and consent to receive the Services by the Practitioner via telemedicine. I understand that the telemedicine visit will involve communicating with the Practitioner through live audiovisual communication technology and the disclosure of certain medical information by electronic transmission. I acknowledge that I have been given the opportunity to request an in-person assessment or other available alternative prior to the telemedicine visit and am voluntarily participating in the telemedicine visit.  I understand that I have the right to withhold or withdraw my consent to the use of telemedicine in the course of my care at any time, without affecting my right to future care or treatment,  and that the Practitioner or I may terminate the telemedicine visit at any time. I understand that I have the right to inspect all information obtained and/or recorded in the course of the telemedicine visit and may receive copies of available information for a reasonable fee.  I understand that some of the potential risks of receiving the Services via telemedicine include:  Marland Kitchen Delay or interruption in medical evaluation due to technological equipment failure or disruption; . Information transmitted may not be sufficient (e.g. poor resolution of images) to allow for appropriate medical decision making by the Practitioner; and/or  . In rare instances, security protocols could fail, causing a breach of personal health information.  Furthermore, I acknowledge that it is my responsibility to provide information about my medical history, conditions and care that is complete and accurate to the best of my ability. I acknowledge that Practitioner's advice, recommendations, and/or decision may be based on factors not within their control, such as incomplete or inaccurate data provided by me or distortions of diagnostic images or specimens that may result from electronic transmissions. I understand that  the practice of medicine is not an exact science and that Practitioner makes no warranties or guarantees regarding treatment outcomes. I acknowledge that I will receive a copy of this consent concurrently upon execution via email to the email address I last provided but may also request a printed copy by calling the office of [facility].    I understand that my insurance will be billed for this visit.   I have read or had this consent read to me. . I understand the contents of this consent, which adequately explains the benefits and risks of the Services being provided via telemedicine.  . I have been provided ample opportunity to ask questions regarding this consent and the Services and have had my questions  answered to my satisfaction. . I give my informed consent for the services to be provided through the use of telemedicine in my medical care  By participating in this telemedicine visit I agree to the above.

## 2019-05-29 ENCOUNTER — Other Ambulatory Visit: Payer: Self-pay

## 2019-05-29 ENCOUNTER — Encounter: Payer: Self-pay | Admitting: Family Medicine

## 2019-05-29 ENCOUNTER — Ambulatory Visit (INDEPENDENT_AMBULATORY_CARE_PROVIDER_SITE_OTHER): Payer: Medicare HMO | Admitting: Family Medicine

## 2019-05-29 DIAGNOSIS — L989 Disorder of the skin and subcutaneous tissue, unspecified: Secondary | ICD-10-CM

## 2019-05-29 DIAGNOSIS — J3489 Other specified disorders of nose and nasal sinuses: Secondary | ICD-10-CM

## 2019-05-29 NOTE — Patient Instructions (Signed)
This doesn't look typical for a bite or for diabetes.  It likely is more noticeable since that area doesn't have much cushion on anybody.   I think it is gradually going to flatten out.   It doesn't look infected.  Take care.  Glad to see you.

## 2019-05-29 NOTE — Progress Notes (Signed)
Leg lesion.  Unclear if she bumped it initially.  She tried topical steroids.    She is taking PPI BID recently but is going to try to back down to QD as tolerated.  Prev epigastric burning is better.    She has rhinorrhea with cold milkshakes.  It doesn't happen with drinking milk.  It does not seem to happen unless she is drinking a really cold food or drink.  Meds, vitals, and allergies reviewed.   ROS: Per HPI unless specifically indicated in ROS section   nad ncat L shin with 1cm slightly puffy area, minimally ttp, not bruised and not red.  Not hot.

## 2019-05-31 DIAGNOSIS — J3489 Other specified disorders of nose and nasal sinuses: Secondary | ICD-10-CM | POA: Insufficient documentation

## 2019-05-31 NOTE — Assessment & Plan Note (Signed)
Discussed with patient about options/ddx.  This doesn't look typical for a bite or for diabetes.  It likely is more noticeable since that area doesn't have much cushion, along the anterior shin. I think it is gradually going to flatten out.   It doesn't look infected.  She will update me as needed.  Reassured.  She agrees with plan.

## 2019-05-31 NOTE — Assessment & Plan Note (Signed)
I think she has temperature sensitive rhinorrhea that is induced by cold foods.  It does not seem to be a primary dairy issue since it does not happen with drinking milk.  Discussed with patient.  She will update me as needed.

## 2019-06-26 ENCOUNTER — Encounter: Payer: Self-pay | Admitting: Cardiovascular Disease

## 2019-06-26 ENCOUNTER — Other Ambulatory Visit: Payer: Self-pay

## 2019-06-26 ENCOUNTER — Telehealth (INDEPENDENT_AMBULATORY_CARE_PROVIDER_SITE_OTHER): Payer: Medicare HMO | Admitting: Cardiovascular Disease

## 2019-06-26 VITALS — BP 116/94 | HR 67 | Ht 59.0 in | Wt 129.0 lb

## 2019-06-26 DIAGNOSIS — I10 Essential (primary) hypertension: Secondary | ICD-10-CM | POA: Diagnosis not present

## 2019-06-26 DIAGNOSIS — Z79899 Other long term (current) drug therapy: Secondary | ICD-10-CM

## 2019-06-26 DIAGNOSIS — I493 Ventricular premature depolarization: Secondary | ICD-10-CM

## 2019-06-26 NOTE — Progress Notes (Signed)
Virtual Visit via Video Note   This visit type was conducted due to national recommendations for restrictions regarding the COVID-19 Pandemic (e.g. social distancing) in an effort to limit this patient's exposure and mitigate transmission in our community.  Due to her co-morbid illnesses, this patient is at least at moderate risk for complications without adequate follow up.  This format is felt to be most appropriate for this patient at this time.  All issues noted in this document were discussed and addressed.  A limited physical exam was performed with this format.  Please refer to the patient's chart for her consent to telehealth for [facility].   Date:  06/26/2019   ID:  Jillian Oconnell, DOB 08-21-1934, MRN [id number]  Patient Location: Home Provider Location: Office  PCP:  Tonia Ghent, MD  Cardiologist:  Kathlyn Sacramento, MD  Electrophysiologist:  None   Evaluation Performed:  Follow-Up Visit  Chief Complaint: Doing well with no complaints  History of Present Illness:    Jillian Oconnell is a 85 y.o. female who was seen via video visit for follow-up regarding palpitations with known history of sinus tachycardia, possible paroxysmal supraventricular tachycardia and frequent  PVCs.  She had previous cardiac catheterization in February of 2009 which showed minor luminal irregularities with no evidence of obstructive disease. Ejection fraction was normal. Most recent echocardiogram in 09/2016 showed normal LV systolic function, No significant valvular abnormalities and normal pulmonary pressure.   Most recent Holter monitor in October 2017 showed very frequent PVCs with a total of 34,000 beats representing 25% burden.   She improved significantly with metoprolol which was gradually increased.    She has been doing well with no recent chest pain or shortness of breath.  No palpitations, dizziness or syncope.  The patient does not have symptoms concerning for COVID-19 infection  (fever, chills, cough, or new shortness of breath).    Past Medical History:  Diagnosis Date  . Allergy   . Anxiety   . Asthma   . Concussion    with fall at home 10/2016  . Depression   . GERD (gastroesophageal reflux disease)   . Glaucoma   . Hyperlipidemia   . Hypertension   . IBS (irritable bowel syndrome)   . OA (osteoarthritis) of knee    injections  . Osteopenia    DXA 2011  . Palpitations   . Paroxysmal supraventricular tachycardia ([place]) May of 2005  . 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 Shingles    post herpetic neuralgia (pain clinic in past)   Past Surgical History:  Procedure Laterality Date  . BREAST EXCISIONAL BIOPSY Right 1988   NEG  . CARDIAC CATHETERIZATION  01/2008   65% ;[place]. Minor luminal irregularities with no evidence of obstructive disease.  . cataract surgery    . [place]  . RECTOCELE REPAIR  2004  . TOTAL ABDOMINAL HYSTERECTOMY       Current Meds  Medication Sig  . albuterol (PROVENTIL HFA;VENTOLIN HFA) 108 (90 Base) MCG/ACT inhaler Inhale 1-2 puffs into the lungs every 6 (six) hours as needed for wheezing or shortness of breath.  . Calcium Carbonate-Vitamin D (CALTRATE 600+D) 600-400 MG-UNIT per tablet Take 1 tablet by mouth two times a day  . fluticasone (FLONASE) 50 MCG/ACT nasal spray Place 2 sprays into both nostrils daily.  . Fluticasone Propionate, Inhal, (FLOVENT DISKUS) 250 MCG/BLIST AEPB INHALE 2 PUFFS  BY MOUTH IN THE AM, 2 PUFF IN THE PM, RINSE AFTER USE  . HYDROcodone-acetaminophen (NORCO) 7.5-325 MG tablet Take 1 tablet by mouth 2 (two) times daily as needed (for pain). Fill on/after 06/02/2019  . Lidocaine 2 % GEL Apply 1 application topically daily as needed (for hemorrhoids if needed for pain).  Marland Kitchen loratadine (CLARITIN) 10 MG tablet TAKE ONE TABLET BY MOUTH ONCE DAILY  . LORazepam (ATIVAN) 1 MG tablet TAKE 1/2 (ONE-HALF) TABLET BY MOUTH AT BEDTIME AS NEEDED FOR ANXIETY  .  metoprolol succinate (TOPROL-XL) 100 MG 24 hr tablet TAKE 1 TABLET BY MOUTH TWICE DAILY TAKE  WITH  OR  IMMEDIATELY  FOLLOWING  A  MEAL  . montelukast (SINGULAIR) 10 MG tablet Take 1 tablet (10 mg total) by mouth at bedtime.  Marland Kitchen omeprazole (PRILOSEC) 20 MG capsule Take 1 capsule (20 mg total) by mouth daily.  . Probiotic Product (PROBIOTIC PO) Take by mouth.  . triamcinolone cream (KENALOG) 0.1 % Apply 1 application topically 2 (two) times daily.     Allergies:   Albuterol, Alendronate sodium, Amoxicillin-pot clavulanate, Atorvastatin, Azithromycin, Ezetimibe-simvastatin, Gabapentin, Ibandronate sodium, Lexapro [escitalopram oxalate], Lyrica [pregabalin], Risedronate sodium, Rosuvastatin, Sertraline, and Sulfonamide derivatives   Social History   Tobacco Use  . Smoking status: Never Smoker  . Smokeless tobacco: Never Used  Substance Use Topics  . Alcohol use: No    Alcohol/week: 0.0 standard drinks  . Drug use: No     Family Hx: [name] family history includes Aneurysm in her father; Anxiety disorder in her daughter; Breast cancer in her daughter; Breast cancer (age of onset: 84) in her daughter; Breast cancer (age of onset: 21) in her mother; Cancer in her daughter and sister; Colon cancer in her sister; Hypertension in her father and mother.  ROS:   Please see the history of present illness.     All other systems reviewed and are negative.   Prior CV studies:   The following studies were reviewed today:    Labs/Other Tests and Data Reviewed:    EKG:  No ECG reviewed.  Recent Labs: 10/13/2018: BUN 23; Creatinine, Ser 1.23; Potassium 4.9; Sodium 140   Recent Lipid Panel Lab Results  Component Value Date/Time   CHOL 243 (H) 01/06/2013 11:57 AM   TRIG 186.0 (H) 01/06/2013 11:57 AM   HDL 59.40 01/06/2013 11:57 AM   CHOLHDL 4 01/06/2013 11:57 AM   LDLCALC 107 (H) 01/05/2010 10:03 AM   LDLDIRECT 148.1 01/06/2013 11:57 AM    Wt Readings from Last 3 Encounters:   06/26/19 129 lb (58.5 kg)  05/29/19 131 lb 6 oz (59.6 kg)  05/19/19 130 lb (59 kg)     Objective:    Vital Signs:  BP (!) 116/94 (BP Location: Right Arm, Patient Position: Sitting, Cuff Size: Normal)   Pulse 67   Ht 4\' 11"  (1.499 m)   Wt 129 lb (58.5 kg)   BMI 26.05 kg/m    VITAL SIGNS:  reviewed GEN:  no acute distress EYES:  sclerae anicteric, EOMI - Extraocular Movements Intact RESPIRATORY:  normal respiratory effort, symmetric expansion SKIN:  no rash, lesions or ulcers. MUSCULOSKELETAL:  no obvious deformities. NEURO:  alert and oriented x 3, no obvious focal deficit PSYCH:  normal affect  ASSESSMENT & PLAN:    1.  Symptomatic PVCs: She is doing very well on current dose of Toprol 100 mg once daily with no significant palpitations.  2. History of sinus tachycardia: Resolved with metoprolol.  3. Essential  hypertension:  Blood pressure is reasonably controlled on current dose of metoprolol.  COVID-19 Education: The signs and symptoms of COVID-19 were discussed with the patient and how to seek care for testing (follow up with PCP or arrange E-visit).  The importance of social distancing was discussed today.  Time:   Today, I have spent 8 minutes with the patient with telehealth technology discussing the above problems.     Medication Adjustments/Labs and Tests Ordered: Current medicines are reviewed at length with the patient today.  Concerns regarding medicines are outlined above.   Tests Ordered: No orders of the defined types were placed in this encounter.   Medication Changes: No orders of the defined types were placed in this encounter.   Follow Up:  In Person in 1 year(s)  Signed, Kathlyn Sacramento, MD  06/26/2019 3:41 PM    [place]

## 2019-06-26 NOTE — Patient Instructions (Signed)
Medication Instructions:  Continue same medication If you need a refill on your cardiac medications before your next appointment, please call your pharmacy.   Lab work: None If you have labs (blood work) drawn today and your tests are completely normal, you will receive your results only by: Marland Kitchen MyChart Message (if you have MyChart) OR . A paper copy in the mail If you have any lab test that is abnormal or we need to change your treatment, we will call you to review the results.  Testing/Procedures: None  Follow-Up: At [facility], you and your health needs are our priority.  As part of our continuing mission to provide you with exceptional heart care, we have created designated Provider Care Teams.  These Care Teams include your primary Cardiologist (physician) and Advanced Practice Providers (APPs -  Physician Assistants and Nurse Practitioners) who all work together to provide you with the care you need, when you need it. You will need a follow up appointment in 1 years.  Please call our office 2 months in advance to schedule this appointment.  You may see Kathlyn Sacramento, MD or one of the following Advanced Practice Providers on your designated Care Team:   Murray Hodgkins, NP Christell Faith, PA-C . Marrianne Mood, PA-C

## 2019-07-03 ENCOUNTER — Other Ambulatory Visit: Payer: Self-pay | Admitting: Family Medicine

## 2019-07-03 NOTE — Telephone Encounter (Signed)
Electronic refill request. Lorazepam Last office visit:   05/29/2019 Acute Last Filled:    45 tablet 0 03/26/2019  Please advise.

## 2019-07-05 NOTE — Telephone Encounter (Signed)
Sent. Thanks.   

## 2019-07-06 ENCOUNTER — Telehealth: Payer: Self-pay

## 2019-07-06 NOTE — Telephone Encounter (Signed)
[facility] Night - Client TELEPHONE ADVICE RECORD AccessNurse Patient Name: Jillian Oconnell Gender: Female DOB: 03-14-1934 Age: 85 Y 89 M 6 D Return Phone Number: [phone] (Primary) Address: City/State/Zip:  [place] [address] Client  Primary Care [facility] Night - Client Client Site [facility] Physician Renford Dills - MD Contact Type Call Who Is Calling Patient / Member / Family / Caregiver Call Type Triage / Clinical Relationship To Patient Self Return Phone Number [phone] (Primary) Chief Complaint Prescription Refill or Medication Request (non symptomatic) Reason for Call Medication Question / Request Initial Comment Caller states that she is out of her Ativan. [facility] does not have the prescription. Translation No Nurse Assessment Nurse: Hardin Negus, RN, Mardene Celeste Date/Time Eilene Ghazi Time): 07/04/2019 2:57:46 PM Please select the assessment type ---Refill Does the patient have enough medication to last until the office opens? ---No Guidelines Guideline Title Affirmed Question Affirmed Notes Nurse Date/Time ([place] Time) Disp. Time Eilene Ghazi Time) Disposition Final User 07/04/2019 2:28:06 PM Send To Nurse Dina Rich, RN, [place] 07/04/2019 3:09:02 PM Clinical Call Yes Hardin Negus, RN, Mardene Celeste

## 2019-07-06 NOTE — Telephone Encounter (Signed)
Rx for lorazepam was approved and sent in on 7/19 (yesterday) by provider to patient's pharmacy.

## 2019-07-13 ENCOUNTER — Other Ambulatory Visit: Payer: Self-pay | Admitting: *Deleted

## 2019-07-13 NOTE — Telephone Encounter (Signed)
Patient left a voicemail requesting a refill on her Hydrocodone  Name of Medication: Hydrocodone Name of Pharmacy: [facility] or Written Date and Quantity: 03/26/19  #60 Last Office Visit and Type: 05/29/19 Next Office Visit and Type: 08/10/19 CPE Last Controlled Substance Agreement Date: 11/09/13 Last UDS:12/18/17

## 2019-07-14 MED ORDER — HYDROCODONE-ACETAMINOPHEN 7.5-325 MG PO TABS: 1.0000 | ORAL_TABLET | Freq: Two times a day (BID) | ORAL | 0 refills | Status: DC | PRN

## 2019-07-14 NOTE — Telephone Encounter (Signed)
Sent. Thanks.   

## 2019-07-31 ENCOUNTER — Other Ambulatory Visit: Payer: Self-pay | Admitting: Family Medicine

## 2019-07-31 DIAGNOSIS — I10 Essential (primary) hypertension: Secondary | ICD-10-CM

## 2019-07-31 DIAGNOSIS — M810 Age-related osteoporosis without current pathological fracture: Secondary | ICD-10-CM

## 2019-08-06 ENCOUNTER — Ambulatory Visit: Payer: Medicare HMO

## 2019-08-07 ENCOUNTER — Other Ambulatory Visit (INDEPENDENT_AMBULATORY_CARE_PROVIDER_SITE_OTHER): Payer: Medicare HMO

## 2019-08-07 DIAGNOSIS — I10 Essential (primary) hypertension: Secondary | ICD-10-CM | POA: Diagnosis not present

## 2019-08-07 DIAGNOSIS — M810 Age-related osteoporosis without current pathological fracture: Secondary | ICD-10-CM

## 2019-08-07 NOTE — Addendum Note (Signed)
Addended by: Cloyd Stagers on: 08/07/2019 01:34 PM   Modules accepted: Orders

## 2019-08-08 LAB — LIPID PANEL
Cholesterol: 202 mg/dL — ABNORMAL HIGH (ref <200)
HDL: 63 mg/dL (ref >=50)
LDL Cholesterol (Calc): 116 mg/dL (calc) — ABNORMAL HIGH
Non-HDL Cholesterol (Calc): 139 mg/dL (calc) — ABNORMAL HIGH (ref <130)
Total CHOL/HDL Ratio: 3.2 (calc) (ref <5.0)
Triglycerides: 120 mg/dL (ref <150)

## 2019-08-08 LAB — COMPREHENSIVE METABOLIC PANEL
AG Ratio: 1.5 (calc) (ref 1.0–2.5)
ALT: 9 U/L (ref 6–29)
AST: 20 U/L (ref 10–35)
Albumin: 4.1 g/dL (ref 3.6–5.1)
Alkaline phosphatase (APISO): 48 U/L (ref 37–153)
BUN/Creatinine Ratio: 17 (calc) (ref 6–22)
BUN: 21 mg/dL (ref 7–25)
CO2: 23 mmol/L (ref 20–32)
Calcium: 9.6 mg/dL (ref 8.6–10.4)
Chloride: 98 mmol/L (ref 98–110)
Creat: 1.27 mg/dL — ABNORMAL HIGH (ref 0.60–0.88)
Globulin: 2.8 g/dL (calc) (ref 1.9–3.7)
Glucose, Bld: 88 mg/dL (ref 65–99)
Potassium: 5.9 mmol/L — ABNORMAL HIGH (ref 3.5–5.3)
Sodium: 137 mmol/L (ref 135–146)
Total Bilirubin: 0.6 mg/dL (ref 0.2–1.2)
Total Protein: 6.9 g/dL (ref 6.1–8.1)

## 2019-08-08 LAB — TSH: TSH: 3.64 mIU/L (ref 0.40–4.50)

## 2019-08-08 LAB — VITAMIN D 25 HYDROXY (VIT D DEFICIENCY, FRACTURES): Vit D, 25-Hydroxy: 52 ng/mL (ref 30–100)

## 2019-08-09 ENCOUNTER — Other Ambulatory Visit: Payer: Self-pay | Admitting: Cardiovascular Disease

## 2019-08-10 ENCOUNTER — Other Ambulatory Visit: Payer: Self-pay

## 2019-08-10 ENCOUNTER — Encounter: Payer: Self-pay | Admitting: Family Medicine

## 2019-08-10 ENCOUNTER — Ambulatory Visit (INDEPENDENT_AMBULATORY_CARE_PROVIDER_SITE_OTHER): Payer: Medicare HMO | Admitting: Family Medicine

## 2019-08-10 VITALS — BP 120/80 | HR 77 | Temp 98.1°F | Ht 59.0 in | Wt 129.2 lb

## 2019-08-10 DIAGNOSIS — E875 Hyperkalemia: Secondary | ICD-10-CM | POA: Diagnosis not present

## 2019-08-10 DIAGNOSIS — Z7189 Other specified counseling: Secondary | ICD-10-CM

## 2019-08-10 DIAGNOSIS — Z0000 Encounter for general adult medical examination without abnormal findings: Secondary | ICD-10-CM | POA: Diagnosis not present

## 2019-08-10 DIAGNOSIS — B0229 Other postherpetic nervous system involvement: Secondary | ICD-10-CM

## 2019-08-10 LAB — POTASSIUM: Potassium: 5.4 mEq/L — ABNORMAL HIGH (ref 3.5–5.1)

## 2019-08-10 MED ORDER — HYDROCODONE-ACETAMINOPHEN 7.5-325 MG PO TABS: 1.0000 | ORAL_TABLET | Freq: Two times a day (BID) | ORAL | 0 refills | Status: DC | PRN

## 2019-08-10 NOTE — Patient Instructions (Addendum)
Call about a mammogram and eye appointment when possible.  Take care.  Glad to see you.  Go to the lab on the way out.  We'll contact you with your lab report. Don't change your meds for now.  Update me as needed.

## 2019-08-10 NOTE — Progress Notes (Signed)
I have personally reviewed the Medicare Annual Wellness questionnaire and have noted 1. [name] medical and social history 2. Their use of alcohol, tobacco or illicit drugs 3. Their current medications and supplements 4. [name] functional ability including ADL's, fall risks, home safety risks and hearing or visual             impairment. 5. Diet and physical activities 6. Evidence for depression or mood disorders  The patients weight, height, BMI have been recorded in the chart and visual acuity is per eye clinic.  I have made referrals, counseling and provided education to the patient based review of the above and I have provided the pt with a written personalized care plan for preventive services.  Provider list updated- see scanned forms.  Routine anticipatory guidance given to patient.  See health maintenance. The possibility exists that previously documented standard health maintenance information may have been brought forward from a previous encounter into this note.  If needed, that same information has been updated to reflect the current situation based on today's encounter.    Flu discussed with patient.  She wanted to wait until next month. Shingles discussed with patient. PNA up-to-date Tetanus 2013 Colon cancer screening not applicable due to age. Breast cancer screening follow-up pending Living will d/w pt. Daughter [name] designated if patient were incapacitated.  Cognitive function addressed- see scanned forms- and if abnormal then additional documentation follows.   Hyper K noted with presumed false elevation and recheck pending.  D/w pt. see notes on labs.  Shingles pain.  Still on baseline pain medications.  She still has persistent stinging pain on the left side of her thorax and previous zoster distribution.  No adverse effect on medication.  She gets some relief from medication.  She has been able to taper down some over the years.  On stable dose  currently.  She is at 6 years from the death of her husband.   She is still trying to adjust to all of the changes in her life.  "I am taking it one day at a time."    PMH and SH reviewed  Meds, vitals, and allergies reviewed.   ROS: Per HPI.  Unless specifically indicated otherwise in HPI, the patient denies:  General: fever. Eyes: acute vision changes ENT: sore throat Cardiovascular: chest pain Respiratory: SOB GI: vomiting GU: dysuria Musculoskeletal: acute back pain Derm: acute rash Neuro: acute motor dysfunction Psych: worsening mood Endocrine: polydipsia Heme: bleeding Allergy: hayfever  GEN: nad, alert and oriented HEENT: ncat NECK: supple w/o LA CV: rrr. PULM: ctab, no inc wob ABD: soft, +bs EXT: no edema SKIN: no acute rash  Health Maintenance  Topic Date Due  . MAMMOGRAM  06/25/2019  . INFLUENZA VACCINE  07/18/2019  . TETANUS/TDAP  05/02/2022  . DEXA SCAN  Completed  . PNA vac Low Risk Adult  Completed

## 2019-08-13 ENCOUNTER — Telehealth: Payer: Self-pay | Admitting: *Deleted

## 2019-08-13 ENCOUNTER — Other Ambulatory Visit: Payer: Self-pay | Admitting: Family Medicine

## 2019-08-13 DIAGNOSIS — E875 Hyperkalemia: Secondary | ICD-10-CM | POA: Insufficient documentation

## 2019-08-13 MED ORDER — SODIUM POLYSTYRENE SULFONATE 15 GM/60ML PO SUSP: 15.0000 g | Freq: Once | ORAL | 0 refills | Status: AC

## 2019-08-13 NOTE — Assessment & Plan Note (Signed)
Still on baseline pain medications.  She still has persistent stinging pain on the left side of her thorax and previous zoster distribution.  No adverse effect on medication.  She gets some relief from medication.  She has been able to taper down some over the years.  On stable dose currently.  Continue as is.  She agrees.

## 2019-08-13 NOTE — Assessment & Plan Note (Signed)
Hyper K noted with presumed false elevation and recheck pending.  D/w pt. see notes on labs.

## 2019-08-13 NOTE — Assessment & Plan Note (Signed)
Living will d/w pt. Daughter Vicki designated if patient were incapacitated.   

## 2019-08-13 NOTE — Assessment & Plan Note (Signed)
Flu discussed with patient.  She wanted to wait until next month. Shingles discussed with patient. PNA up-to-date Tetanus 2013 Colon cancer screening not applicable due to age. Breast cancer screening follow-up pending Living will d/w pt. Daughter [name] designated if patient were incapacitated.  Cognitive function addressed- see scanned forms- and if abnormal then additional documentation follows.

## 2019-08-13 NOTE — Telephone Encounter (Signed)
Pt left message at triage requesting call back regarding lab results. Please call pt at [phone] with lab results when able

## 2019-08-14 ENCOUNTER — Other Ambulatory Visit: Payer: Self-pay | Admitting: Family Medicine

## 2019-08-14 DIAGNOSIS — Z1231 Encounter for screening mammogram for malignant neoplasm of breast: Secondary | ICD-10-CM

## 2019-08-17 ENCOUNTER — Other Ambulatory Visit (INDEPENDENT_AMBULATORY_CARE_PROVIDER_SITE_OTHER): Payer: Medicare HMO

## 2019-08-17 DIAGNOSIS — E875 Hyperkalemia: Secondary | ICD-10-CM

## 2019-08-17 LAB — POTASSIUM: Potassium: 4.5 mEq/L (ref 3.5–5.1)

## 2019-09-14 ENCOUNTER — Telehealth: Payer: Self-pay | Admitting: Family Medicine

## 2019-09-14 MED ORDER — OMEPRAZOLE 20 MG PO CPDR: 20.0000 mg | DELAYED_RELEASE_CAPSULE | Freq: Every day | ORAL | 3 refills | Status: DC

## 2019-09-14 NOTE — Telephone Encounter (Signed)
Name of Medication: hydrocodone apap 7.5-325 mg Name of Pharmacy: [facility] [address] Last Fill or Written Date and Quantity: # 23 on 08/10/19 Last Office Visit and Type: 08/10/19 annual Next Office Visit and Type: none scheduled Last Controlled Substance Agreement Date: 12/18/2017 Last UDS:12/18/2017  Per [facility] note pt has 1-2 pills left. I refilled omeprazole per protocol to [facility] [address].

## 2019-09-14 NOTE — Telephone Encounter (Signed)
Patient called to get a refill on Hydrocodone.  Patient has 1-2 pills left.  Patient uses [place].  Patient, also needs a refill on Omeprazole 20 mg.Marland Kitchen

## 2019-09-15 ENCOUNTER — Ambulatory Visit (INDEPENDENT_AMBULATORY_CARE_PROVIDER_SITE_OTHER): Payer: Medicare HMO

## 2019-09-15 DIAGNOSIS — Z23 Encounter for immunization: Secondary | ICD-10-CM

## 2019-09-15 MED ORDER — HYDROCODONE-ACETAMINOPHEN 7.5-325 MG PO TABS: 1.0000 | ORAL_TABLET | Freq: Two times a day (BID) | ORAL | 0 refills | Status: DC | PRN

## 2019-09-15 NOTE — Telephone Encounter (Signed)
Sent. Thanks.   

## 2019-09-25 ENCOUNTER — Ambulatory Visit
Admission: RE | Admit: 2019-09-25 | Discharge: 2019-09-25 | Disposition: A | Discharge status: Home / Self Care | Payer: Medicare HMO | Source: Ambulatory Visit | Attending: Family Medicine | Admitting: Family Medicine

## 2019-09-25 DIAGNOSIS — Z1231 Encounter for screening mammogram for malignant neoplasm of breast: Secondary | ICD-10-CM

## 2019-09-29 DIAGNOSIS — L820 Inflamed seborrheic keratosis: Secondary | ICD-10-CM | POA: Diagnosis not present

## 2019-09-29 DIAGNOSIS — L570 Actinic keratosis: Secondary | ICD-10-CM | POA: Diagnosis not present

## 2019-09-29 DIAGNOSIS — L720 Epidermal cyst: Secondary | ICD-10-CM | POA: Diagnosis not present

## 2019-09-30 ENCOUNTER — Other Ambulatory Visit: Payer: Self-pay | Admitting: Family Medicine

## 2019-09-30 NOTE — Telephone Encounter (Signed)
Name of Medication: Lorazepam Name of Pharmacy: [facility] Last Fill or Written Date and Quantity: 07/05/2019 #45, with 0 refill Last Office Visit and Type: 08/10/2019 AWV Next Office Visit and Type: none scheduled Last Controlled Substance Agreement Date: 12/18/2017 Last UDS: 12/18/2017

## 2019-09-30 NOTE — Telephone Encounter (Signed)
Sent. Thanks.   

## 2019-10-19 ENCOUNTER — Other Ambulatory Visit: Payer: Self-pay | Admitting: *Deleted

## 2019-10-19 ENCOUNTER — Telehealth: Payer: Self-pay | Admitting: Family Medicine

## 2019-10-19 MED ORDER — HYDROCODONE-ACETAMINOPHEN 7.5-325 MG PO TABS: 1.0000 | ORAL_TABLET | Freq: Two times a day (BID) | ORAL | 0 refills | Status: DC | PRN

## 2019-10-19 NOTE — Telephone Encounter (Signed)
Patient called for refill on Hydrocodone saying she is out of her meds. Last office visit:   08/10/2019 CPE Last Filled:    60 tablet 0 09/15/2019  Please advise.

## 2019-10-19 NOTE — Telephone Encounter (Signed)
Pt called needing to get a refill on hydrocodone   [facility] [address]  Pt is out of her meds  Pt is aware dr Damita Dunnings is out of office

## 2019-10-19 NOTE — Telephone Encounter (Signed)
Sent. Thanks.   

## 2019-10-31 ENCOUNTER — Other Ambulatory Visit: Payer: Self-pay | Admitting: Family Medicine

## 2019-11-03 ENCOUNTER — Other Ambulatory Visit: Payer: Self-pay

## 2019-11-03 MED ORDER — METOPROLOL SUCCINATE ER 100 MG PO TB24: ORAL_TABLET | ORAL | 1 refills | Status: DC

## 2019-11-03 NOTE — Telephone Encounter (Signed)
*  STAT* If patient is at the pharmacy, call can be transferred to refill team.   1. Which medications need to be refilled? (please list name of each medication and dose if known) Metoprolol  2. Which pharmacy/location (including street and city if local pharmacy) is medication to be sent to? [place]   3. Do they need a 30 day or 90 day supply? [place]

## 2019-11-20 ENCOUNTER — Ambulatory Visit (INDEPENDENT_AMBULATORY_CARE_PROVIDER_SITE_OTHER): Payer: Medicare HMO | Admitting: Family Medicine

## 2019-11-20 ENCOUNTER — Other Ambulatory Visit: Payer: Self-pay

## 2019-11-20 VITALS — BP 119/81 | HR 67 | Temp 96.8°F

## 2019-11-20 DIAGNOSIS — F411 Generalized anxiety disorder: Secondary | ICD-10-CM

## 2019-11-20 DIAGNOSIS — J309 Allergic rhinitis, unspecified: Secondary | ICD-10-CM | POA: Diagnosis not present

## 2019-11-20 DIAGNOSIS — Z79891 Long term (current) use of opiate analgesic: Secondary | ICD-10-CM

## 2019-11-20 MED ORDER — LORAZEPAM 1 MG PO TABS: ORAL_TABLET | ORAL | 0 refills | Status: DC

## 2019-11-20 MED ORDER — HYDROCODONE-ACETAMINOPHEN 7.5-325 MG PO TABS: 1.0000 | ORAL_TABLET | Freq: Two times a day (BID) | ORAL | 0 refills | Status: DC | PRN

## 2019-11-20 NOTE — Progress Notes (Signed)
Virtual visit completed through WebEx or similar program Patient location: home  Provider location: Financial controller at [facility], office   Pandemic considerations d/w pt.   Limitations and rationale for visit method d/w patient.  Patient agreed to proceed.   CC: f/u  HPI:  Indication for chronic opioid:chronic L flank pain from shingles prev Medication and dose:hydrocodone 7.5mg  BID # pills per month:60 Last UDS date:12/18/17 Pain contract signed (Y/N):yes Date narcotic database last reviewed (include red flags):11/20/19  We talked about tapering her med rx down to BID, she had tapered prev but didn't get below 2 tabs a day. She has more pain in the evening but can tolerate BID dosing. we talked about taking an extra 1/2 tab if needed.   Pain inventory (1-10) Average pain:Worse at night. Pain now:"maybe 5/10" and that is tolerable.   My pain isburning, grabbing, pinching.   Pain is worseat night Relief from meds:yes  In the last 24 hours, how much has pain interfered with the following (1-10 greatest interference)? General activity- still with some limitation from pain.she is trying to be active.  She careful to avoid falls.  Relationships with others- some limitation but more limited by transportation and recent pandemic. She desires extra interaction with others less compared to years ago.  Enjoyment of life-still affected more by death of her husband and concerns regarding other family members medical conditions. What time of the day is the pain the worst- at night. Sleep is described by patient as'good'  Mobility/function Assistance device:no How many minutes can you walk:as needed with pain. Able to climb steps:yes Driving:locally Disabled:no  Bowel or bladder symptoms:no Mood:stable, "alright", "going one day at a time."   Physicians involved in care:no Any changes since last visit?no  She couldn't get to [place] for IV  bisphosphonate tx. D/w pt. Deferred for now, given pandemic  Her shoulder pain is better from prev.   Sinus pressure.  claritin didn't help.  She quit that.  She has used flonase with some relief but she hasn't used it often. No fevers.   No fevers.  Clear rhinorrhea.  D/w pt about using flonase more often.  Her son in law needed a hip replacement but he is doing better.  He daughter with h/o thyroid cancer had voice changes after radiation treatment.  Discussed.  We talked about her anxiety.  She had cut back on her BZD prev.  She talked about prn use.    Meds and allergies reviewed.   ROS: Per HPI unless specifically indicated in ROS section   NAD Speech wnl  A/P: Chronic pain. Indication for chronic opioid:chronic L flank pain from shingles prev Medication and dose:hydrocodone 7.5mg  BID # pills per month:75 Last UDS date:12/18/17 Pain contract signed (Y/N):yes Date narcotic database last reviewed (include red flags):11/20/19  Plan on recheck in about 3-4 months, sooner if needed.  We talked about increasing her pain medication slightly with an extra half tablet per day if needed.  Sedation caution given.  She agrees.  Social stressors/anxiety.  See above.  She previously cut back on benzodiazepine.  Her stressors have increased this year.  Her anxiety is worse in the meantime.  We talked about options.  I wrote a prescription for lorazepam twice a day if needed.  She will update me as needed.  She may not need it twice a day and she be able to get more than 90 days per her prescription.  She will update me as needed.  Still okay for  outpatient follow-up.  Sinus pressure.  No fevers.  She will try using Flonase more often and update me if not improved.  She agrees. >25 minutes spent in face to face time with patient, >50% spent in counselling or coordination of care

## 2019-11-22 ENCOUNTER — Encounter: Payer: Self-pay | Admitting: Family Medicine

## 2019-11-22 NOTE — Assessment & Plan Note (Signed)
Plan on recheck in about 3-4 months, sooner if needed.  We talked about increasing her pain medication slightly with an extra half tablet per day if needed.  Sedation caution given.  She agrees.

## 2019-11-22 NOTE — Assessment & Plan Note (Signed)
  Social stressors/anxiety.  See above.  She previously cut back on benzodiazepine.  Her stressors have increased this year.  Her anxiety is worse in the meantime.  We talked about options.  I wrote a prescription for lorazepam twice a day if needed.  She will update me as needed.  She may not need it twice a day and she be able to get more than 90 days per her prescription.  She will update me as needed.  Still okay for outpatient follow-up.  >25 minutes spent in face to face time with patient, >50% spent in counselling or coordination of care

## 2019-11-22 NOTE — Assessment & Plan Note (Signed)
  Sinus pressure.  No fevers.  She will try using Flonase more often and update me if not improved.  She agrees.

## 2019-12-25 ENCOUNTER — Other Ambulatory Visit: Payer: Self-pay | Admitting: Family Medicine

## 2019-12-25 ENCOUNTER — Ambulatory Visit: Payer: Medicare HMO | Admitting: Family

## 2019-12-25 MED ORDER — HYDROCODONE-ACETAMINOPHEN 7.5-325 MG PO TABS: 1.0000 | ORAL_TABLET | Freq: Two times a day (BID) | ORAL | 0 refills | Status: DC | PRN

## 2019-12-25 NOTE — Telephone Encounter (Signed)
Pt called needing to get a refill on hydrocodone Pt is out of meds [facility] [address]

## 2019-12-25 NOTE — Telephone Encounter (Signed)
Sent. Thanks.   

## 2019-12-25 NOTE — Telephone Encounter (Signed)
Name of Medication: Hydrocodone Name of Pharmacy: [place] or Written Date and Quantity: 11-20-19 #75 Last Office Visit and Type: 12/40/20 Next Office Visit and Type: No Future OV Last Controlled Substance Agreement Date: 12-18-17 Last UDS: 12-18-17

## 2020-01-22 ENCOUNTER — Other Ambulatory Visit: Payer: Self-pay

## 2020-01-22 ENCOUNTER — Telehealth (INDEPENDENT_AMBULATORY_CARE_PROVIDER_SITE_OTHER): Payer: Medicare HMO | Admitting: Cardiovascular Disease

## 2020-01-22 VITALS — BP 129/86 | HR 68 | Temp 96.3°F | Ht <= 58 in | Wt 131.6 lb

## 2020-01-22 DIAGNOSIS — I493 Ventricular premature depolarization: Secondary | ICD-10-CM | POA: Diagnosis not present

## 2020-01-22 DIAGNOSIS — R000 Tachycardia, unspecified: Secondary | ICD-10-CM

## 2020-01-22 DIAGNOSIS — R600 Localized edema: Secondary | ICD-10-CM

## 2020-01-22 DIAGNOSIS — I10 Essential (primary) hypertension: Secondary | ICD-10-CM

## 2020-01-22 NOTE — Patient Instructions (Signed)
Medication Instructions:  Continue same medications . *If you need a refill on your cardiac medications before your next appointment, please call your pharmacy*  Lab Work: None If you have labs (blood work) drawn today and your tests are completely normal, you will receive your results only by: . MyChart Message (if you have MyChart) OR . A paper copy in the mail If you have any lab test that is abnormal or we need to change your treatment, we will call you to review the results.  Testing/Procedures: None  Follow-Up: At [facility], you and your health needs are our priority.  As part of our continuing mission to provide you with exceptional heart care, we have created designated Provider Care Teams.  These Care Teams include your primary Cardiologist (physician) and Advanced Practice Providers (APPs -  Physician Assistants and Nurse Practitioners) who all work together to provide you with the care you need, when you need it.  Your next appointment:   6 month(s)  The format for your next appointment:   In Person  Provider:    You may see Iriana Artley, MD or one of the following Advanced Practice Providers on your designated Care Team:    Christopher Berge, NP  Ryan Dunn, PA-C  Jacquelyn Visser, PA-C    

## 2020-01-22 NOTE — Progress Notes (Signed)
Virtual Visit via Telephone Note   This visit type was conducted due to national recommendations for restrictions regarding the COVID-19 Pandemic (e.g. social distancing) in an effort to limit this patient's exposure and mitigate transmission in our community.  Due to her co-morbid illnesses, this patient is at least at moderate risk for complications without adequate follow up.  This format is felt to be most appropriate for this patient at this time.  All issues noted in this document were discussed and addressed.  A limited physical exam was performed with this format.  Please refer to the patient's chart for her consent to telehealth for [facility].   Date:  01/22/2020   ID:  Jillian Oconnell, DOB 1934/06/06, MRN [id number]  Patient Location: Home Provider Location: Office  PCP:  Tonia Ghent, MD  Cardiologist:  Kathlyn Sacramento, MD  Electrophysiologist:  None   Evaluation Performed:  Follow-Up Visit  Chief Complaint: Doing well with no complaints  History of Present Illness:    Jillian Oconnell is a 86 y.o. female who was reached via phone for a follow-up visit regarding sinus tachycardia and frequent PVCs.   She had previous cardiac catheterization in February of 2009 which showed minor luminal irregularities with no evidence of obstructive disease. Ejection fraction was normal. Most recent echocardiogram in 09/2016 showed normal LV systolic function, No significant valvular abnormalities and normal pulmonary pressure.   Most recent Holter monitor in October 2017 showed very frequent PVCs with a total of 34,000 beats representing 25% burden.   She improved significantly with metoprolol which was gradually increased to maximal dose.    She has been doing well with no recent chest pain or shortness of breath.  No palpitations, dizziness or syncope.  Blood pressure has been well controlled.  She had some mild leg edema recently but no significant change in her weight.  The patient  does not have symptoms concerning for COVID-19 infection (fever, chills, cough, or new shortness of breath).    Past Medical History:  Diagnosis Date  . Allergy   . Anxiety   . Asthma   . Concussion    with fall at home 10/2016  . Depression   . GERD (gastroesophageal reflux disease)   . Glaucoma   . Hyperlipidemia   . Hypertension   . IBS (irritable bowel syndrome)   . OA (osteoarthritis) of knee    injections  . Osteopenia    DXA 2011  . Palpitations   . Paroxysmal supraventricular tachycardia ([place]) May of 2005  . 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 Shingles    post herpetic neuralgia (pain clinic in past)   Past Surgical History:  Procedure Laterality Date  . BREAST EXCISIONAL BIOPSY Right 1988   NEG  . CARDIAC CATHETERIZATION  01/2008   65% ;[place]. Minor luminal irregularities with no evidence of obstructive disease.  . cataract surgery    . [place]  . RECTOCELE REPAIR  2004  . TOTAL ABDOMINAL HYSTERECTOMY       Current Meds  Medication Sig  . albuterol (PROVENTIL HFA;VENTOLIN HFA) 108 (90 Base) MCG/ACT inhaler Inhale 1-2 puffs into the lungs every 6 (six) hours as needed for wheezing or shortness of breath.  . Calcium Carbonate-Vitamin D (CALTRATE 600+D) 600-400 MG-UNIT per tablet Take 1 tablet by mouth two times a day  . fluticasone (FLONASE) 50 MCG/ACT nasal spray Place 2 sprays  into both nostrils daily.  . Fluticasone Propionate, Inhal, (FLOVENT DISKUS) 250 MCG/BLIST AEPB INHALE 2 PUFFS BY MOUTH IN THE AM, 2 PUFF IN THE PM, RINSE AFTER USE  . HYDROcodone-acetaminophen (NORCO) 7.5-325 MG tablet Take 1 tablet by mouth 2 (two) times daily as needed (for pain. with extra half tab if needed.).  Marland Kitchen Lidocaine 2 % GEL Apply 1 application topically daily as needed (for hemorrhoids if needed for pain).  . LORazepam (ATIVAN) 1 MG tablet 1/2 tab a twice a day if needed. Sedation caution.  . metoprolol succinate (TOPROL-XL)  100 MG 24 hr tablet TAKE 1 TABLET BY MOUTH TWICE DAILY WITH  OR  IMMEDIATELY  FOLLOWING  A  MEAL  . montelukast (SINGULAIR) 10 MG tablet TAKE 1 TABLET BY MOUTH AT BEDTIME  . triamcinolone cream (KENALOG) 0.1 % Apply 1 application topically 2 (two) times daily.     Allergies:   Albuterol, Alendronate sodium, Amoxicillin-pot clavulanate, Atorvastatin, Azithromycin, Ezetimibe-simvastatin, Gabapentin, Ibandronate sodium, Lexapro [escitalopram oxalate], Lyrica [pregabalin], Neosporin [bacitracin-polymyxin b], Risedronate sodium, Rosuvastatin, Sertraline, and Sulfonamide derivatives   Social History   Tobacco Use  . Smoking status: Never Smoker  . Smokeless tobacco: Never Used  Substance Use Topics  . Alcohol use: No    Alcohol/week: 0.0 standard drinks  . Drug use: No     Family Hx: [name] family history includes Aneurysm in her father; Anxiety disorder in her daughter; Breast cancer in her daughter; Breast cancer (age of onset: 47) in her daughter; Breast cancer (age of onset: 41) in her mother; Cancer in her daughter and sister; Colon cancer in her sister; Hypertension in her father and mother.  ROS:   Please see the history of present illness.     All other systems reviewed and are negative.   Prior CV studies:   The following studies were reviewed today:    Labs/Other Tests and Data Reviewed:    EKG:  No ECG reviewed.  Recent Labs: 08/07/2019: ALT 9; BUN 21; Creat 1.27; Sodium 137; TSH 3.64 08/17/2019: Potassium 4.5   Recent Lipid Panel Lab Results  Component Value Date/Time   CHOL 202 (H) 08/07/2019 03:07 PM   TRIG 120 08/07/2019 03:07 PM   HDL 63 08/07/2019 03:07 PM   CHOLHDL 3.2 08/07/2019 03:07 PM   LDLCALC 116 (H) 08/07/2019 03:07 PM   LDLDIRECT 148.1 01/06/2013 11:57 AM    Wt Readings from Last 3 Encounters:  01/22/20 131 lb 9 oz (59.7 kg)  08/10/19 129 lb 4 oz (58.6 kg)  06/26/19 129 lb (58.5 kg)     Objective:    Vital Signs:  BP 129/86   Pulse  68   Temp (!) 96.3 F (35.7 C)   Ht 4\' 10"  (1.473 m)   Wt 131 lb 9 oz (59.7 kg)   BMI 27.50 kg/m    Vital signs reviewed  ASSESSMENT & PLAN:    1.  Symptomatic PVCs: She is doing very well on current dose of Toprol 100 mg twice daily with no significant palpitations.  Her next visit in 6 months has to be in the office so we can do an EKG.  2. History of sinus tachycardia: Resolved with metoprolol.  3. Essential hypertension:  Blood pressure is reasonably controlled on current dose of metoprolol.  4.  Mild leg edema: I suspect some chronic venous insufficiency as she has no symptoms suggestive of heart failure and her weight has been stable.  I advised her to elevate her legs frequently during  the day.  COVID-19 Education: The signs and symptoms of COVID-19 were discussed with the patient and how to seek care for testing (follow up with PCP or arrange E-visit).  The importance of social distancing was discussed today.  Time:   Today, I have spent 8 minutes with the patient with telehealth technology discussing the above problems.     Medication Adjustments/Labs and Tests Ordered: Current medicines are reviewed at length with the patient today.  Concerns regarding medicines are outlined above.   Tests Ordered: No orders of the defined types were placed in this encounter.   Medication Changes: No orders of the defined types were placed in this encounter.   Follow Up: In person in 6 months.  Signed, Kathlyn Sacramento, MD  01/22/2020 9:44 AM    [facility]

## 2020-02-01 ENCOUNTER — Other Ambulatory Visit: Payer: Self-pay | Admitting: *Deleted

## 2020-02-01 ENCOUNTER — Telehealth: Payer: Self-pay | Admitting: Family Medicine

## 2020-02-01 MED ORDER — METOPROLOL SUCCINATE ER 100 MG PO TB24: ORAL_TABLET | ORAL | 1 refills | Status: DC

## 2020-02-01 MED ORDER — MONTELUKAST SODIUM 10 MG PO TABS: 10.0000 mg | ORAL_TABLET | Freq: Every day | ORAL | 1 refills | Status: DC

## 2020-02-01 NOTE — Telephone Encounter (Signed)
Patient requests refills, could not get through to [facility] to request. Hydrocodone Last office visit:   11/20/2019 Last Filled:    75 tablet 0 12/25/2019    Lorazepam Last office visit:   11/20/2019 Last Filled:     90 tablet 0 11/20/2019   Please advise.

## 2020-02-01 NOTE — Telephone Encounter (Signed)
Pt called to request refills on her Hydrocodone, Lorazepam, Singulair, and metoprolol to be sent to [facility] [address]. States she was unable to get through to the pharmacy to request the refills.

## 2020-02-02 MED ORDER — HYDROCODONE-ACETAMINOPHEN 7.5-325 MG PO TABS: 1.0000 | ORAL_TABLET | Freq: Two times a day (BID) | ORAL | 0 refills | Status: DC | PRN

## 2020-02-02 MED ORDER — LORAZEPAM 1 MG PO TABS: ORAL_TABLET | ORAL | 0 refills | Status: DC

## 2020-02-02 NOTE — Telephone Encounter (Signed)
Sent. Thanks.   

## 2020-03-07 ENCOUNTER — Ambulatory Visit (INDEPENDENT_AMBULATORY_CARE_PROVIDER_SITE_OTHER): Payer: Medicare HMO | Admitting: Family Medicine

## 2020-03-07 ENCOUNTER — Encounter: Payer: Self-pay | Admitting: Family Medicine

## 2020-03-07 VITALS — BP 107/71 | HR 72 | Temp 95.7°F | Wt 132.0 lb

## 2020-03-07 DIAGNOSIS — J0140 Acute pansinusitis, unspecified: Secondary | ICD-10-CM | POA: Diagnosis not present

## 2020-03-07 MED ORDER — FLUTICASONE PROPIONATE 50 MCG/ACT NA SUSP: 2.0000 | Freq: Every day | NASAL | 12 refills | Status: DC

## 2020-03-07 MED ORDER — DOXYCYCLINE HYCLATE 100 MG PO TABS: 100.0000 mg | ORAL_TABLET | Freq: Two times a day (BID) | ORAL | 0 refills | Status: DC

## 2020-03-07 NOTE — Progress Notes (Signed)
Virtual Visit via Telephone Note  I connected with Jillian Oconnell on 03/07/20 at 12:00 PM EDT by telephone and verified that I am speaking with the correct person using two identifiers.   I discussed the limitations, risks, security and privacy concerns of performing an evaluation and management service by telephone and the availability of in person appointments. I also discussed with the patient that there may be a patient responsible charge related to this service. The patient expressed understanding and agreed to proceed.  Patient location: Home Provider Location: [place] Participants: Jillian Oconnell and Bonnita Nasuti A Dowdle   History of Present Illness: Chief Complaint  Patient presents with  . Headache    head/sinus pressure. Sx x 3 to 4 weeks.    Headache  This is a new problem. The current episode started 1 to 4 weeks ago. The problem occurs constantly. Associated symptoms include sinus pressure. Pertinent negatives include no coughing, ear pain, fever, nausea, sore throat or vomiting. Exacerbated by: bending over. She has tried nothing for the symptoms. The treatment provided no relief. Her past medical history is significant for sinus disease. There is no history of migraine headaches.   Covid vaccine #1 on 2/25 and was going to get the second last week but is getting it Wednesday  Had the HA prior the shot   No jaw pain or dental pain  Treatment: flonase - with uncertain improvement and singular without improvement - has not tried a sinus rinse before - but does not think she could tolerate   Review of Systems  Constitutional: Negative for chills and fever.  HENT: Positive for congestion, sinus pressure and sinus pain. Negative for ear pain and sore throat.        Post nasal drip  Respiratory: Negative for cough and shortness of breath.   Cardiovascular: Negative for chest pain.  Gastrointestinal: Negative for diarrhea, nausea and vomiting.  Musculoskeletal:  Negative for myalgias.  Neurological: Positive for headaches.      Observations/Objective: BP 107/71 Comment: per patient on 03/06/20  Pulse 72 Comment: per patient on 03/06/20  Temp (!) 95.7 F (35.4 C) Comment: per patient  Wt 132 lb (59.9 kg) Comment: per patient  BMI 27.59 kg/m   Phone visit:  Patient speaking in complete sentences No distress Alert and oriented Normal mood  Assessment and Plan: Problem List Items Addressed This Visit    None    Visit Diagnoses    Acute non-recurrent pansinusitis    -  Primary   Relevant Medications   fluticasone (FLONASE) 50 MCG/ACT nasal spray   doxycycline (VIBRA-TABS) 100 MG tablet     Pt unable to take many medications due to chronic medical condition. Declined saline rinse option  Trial of abx given duration. Advised f/u if symptoms do not resolve. Cont allergy treatment  Follow Up Instructions:  Return if symptoms worsen or fail to improve.   I discussed the assessment and treatment plan with the patient. The patient was provided an opportunity to ask questions and all were answered. The patient agreed with the plan and demonstrated an understanding of the instructions.   The patient was advised to call back or seek an in-person evaluation if the symptoms worsen or if the condition fails to improve as anticipated.  I provided 8 minutes of non-face-to-face time during this encounter.   Jillian Noe, MD

## 2020-03-11 ENCOUNTER — Other Ambulatory Visit: Payer: Self-pay | Admitting: Family Medicine

## 2020-03-11 MED ORDER — HYDROCODONE-ACETAMINOPHEN 7.5-325 MG PO TABS: 1.0000 | ORAL_TABLET | Freq: Two times a day (BID) | ORAL | 0 refills | Status: DC | PRN

## 2020-03-11 NOTE — Telephone Encounter (Signed)
Name of Medication: Hydrocodone Name of Pharmacy: [facility] or Written Date and Quantity: 02/02/20 #75 with 0 refill Last Office Visit and Type: 03/07/20 virtual acute visit with Dr Einar Pheasant Next Office Visit and Type: 03/14/20 3 months chronic pain management follow up Last Controlled Substance Agreement Date: 12/18/2017 Last UDS: 12/18/2017

## 2020-03-11 NOTE — Telephone Encounter (Signed)
Pt called to get a refill on  Hydrocodone Pt is out of meds  [facility] [address]

## 2020-03-14 ENCOUNTER — Ambulatory Visit (INDEPENDENT_AMBULATORY_CARE_PROVIDER_SITE_OTHER): Payer: Medicare HMO | Admitting: Family Medicine

## 2020-03-14 ENCOUNTER — Other Ambulatory Visit: Payer: Self-pay

## 2020-03-14 DIAGNOSIS — J069 Acute upper respiratory infection, unspecified: Secondary | ICD-10-CM | POA: Diagnosis not present

## 2020-03-14 MED ORDER — AMOXICILLIN 875 MG PO TABS: 875.0000 mg | ORAL_TABLET | Freq: Two times a day (BID) | ORAL | 0 refills | Status: DC

## 2020-03-14 NOTE — Progress Notes (Signed)
Interactive audio and video telecommunications were attempted between this provider and patient, however failed, due to patient having technical difficulties OR patient did not have access to video capability.  We continued and completed visit with audio only.   Virtual Visit via Telephone Note  I connected with patient on 03/14/20  at 12:00 PM  by telephone and verified that I am speaking with the correct person using two identifiers.  Location of patient: home   Location of MD: [place] Name of referring provider (if blank then none associated): Names per persons and role in encounter:  MD: Earlyne Iba, Patient: name listed above.    I discussed the limitations, risks, security and privacy concerns of performing an evaluation and management service by telephone and the availability of in person appointments. I also discussed with the patient that there may be a patient responsible charge related to this service. The patient expressed understanding and agreed to proceed.  CC: URI sx.   History of Present Illness:   She had 2nd covid vaccine done.  Mild arm soreness is better/resolved.    Recently started on doxycycline, with 1 day remaining.  She isn't improved in the meantime.  Her voice is still scratchy.  She has some facial/head pressure, worse leaning over with head lower.  She doesn't have ear pressure.  No fevers.  Still on flonase and flovent.  Sx predate the covid vaccine.  She isn't coughing.  She has clear rhinorrhea.    BP 134/82 and pulse 66 temp 97.4, weight 132 lbs this AM.   She is putting up with pain at baseline, still using hydrocodone w/o ADE on med.  Still with L sided thorax pain.    She had some harder stools recent, in spite of taking prunes/prune juice.  We talked about inc fiber and using miralax prn.     Observations/Objective: nad Speech normal.  Assessment and Plan: URI sx.   Stop doxy, change to amoxil.  Use miralax if needed.  Update me  as needed.   Supportive care otherwise. Nontoxic.  Still okay for outpatient follow-up.  She agrees.  Follow Up Instructions: see above.  She agrees with plan.    I discussed the assessment and treatment plan with the patient. The patient was provided an opportunity to ask questions and all were answered. The patient agreed with the plan and demonstrated an understanding of the instructions.   The patient was advised to call back or seek an in-person evaluation if the symptoms worsen or if the condition fails to improve as anticipated.  I provided 15 minutes of non-face-to-face time during this encounter.  Elsie Stain, MD

## 2020-03-16 NOTE — Assessment & Plan Note (Signed)
URI sx.   Stop doxy, change to amoxil.  Use miralax if needed.  Update me as needed.   Supportive care otherwise. Nontoxic.  Still okay for outpatient follow-up.  She agrees.

## 2020-03-28 ENCOUNTER — Other Ambulatory Visit: Payer: Self-pay | Admitting: Family Medicine

## 2020-04-19 ENCOUNTER — Other Ambulatory Visit: Payer: Self-pay

## 2020-04-19 MED ORDER — HYDROCODONE-ACETAMINOPHEN 7.5-325 MG PO TABS: 1.0000 | ORAL_TABLET | Freq: Two times a day (BID) | ORAL | 0 refills | Status: DC | PRN

## 2020-04-19 NOTE — Telephone Encounter (Signed)
Sent. Thanks.  Reasonable to recheck in 06/2020, sooner if needed.

## 2020-04-19 NOTE — Telephone Encounter (Signed)
Patient contacted the office asking for a refill on Hydrocodone. This was last refilled on 03/11/20 for #75 with 0 refills. Patient was last seen in the office on 03/14/20 and has no upcoming appts.  Ok tor refill?

## 2020-04-20 IMAGING — MG MM DIGITAL SCREENING BILAT W/ TOMO W/ CAD
6 of 10 series · 6 of 30 positions shown · non-contrast
Comparison: Previous exam(s).

CLINICAL DATA: Screening.

EXAM:
DIGITAL SCREENING BILATERAL MAMMOGRAM WITH TOMO AND CAD

[R MLO synth-2D (1 of 2)]
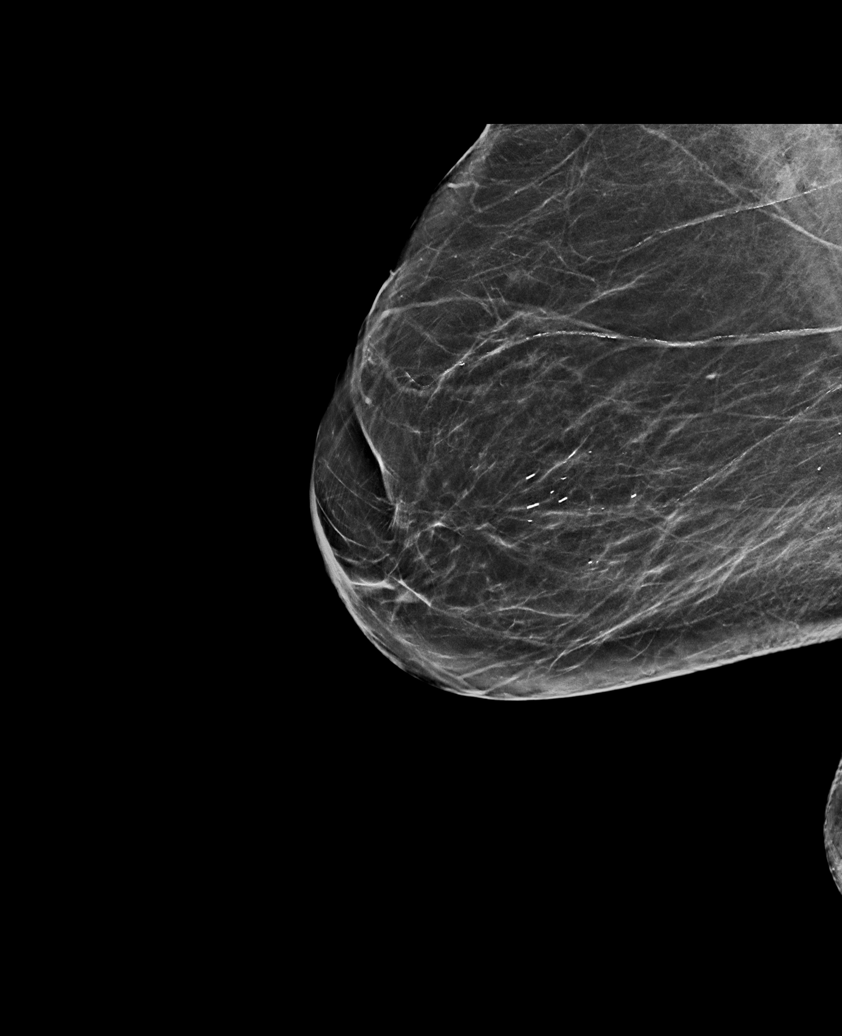

[R MLO synth-2D (2 of 2)]
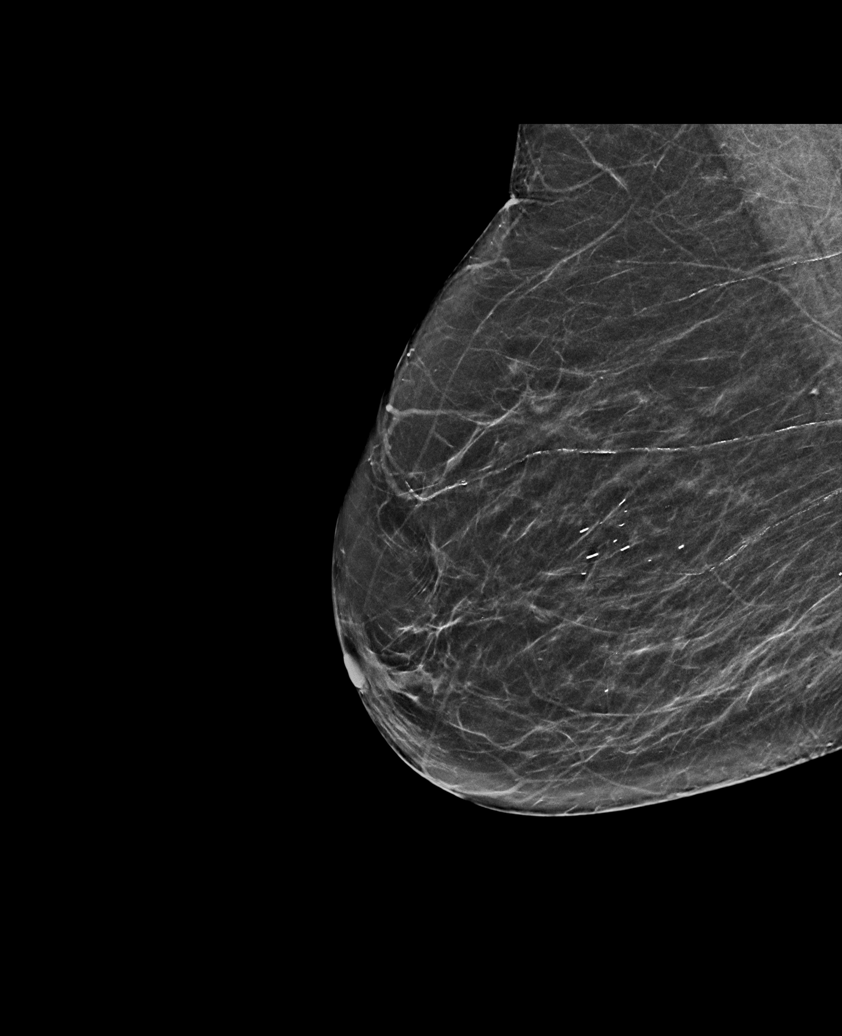

[L CC synth-2D]
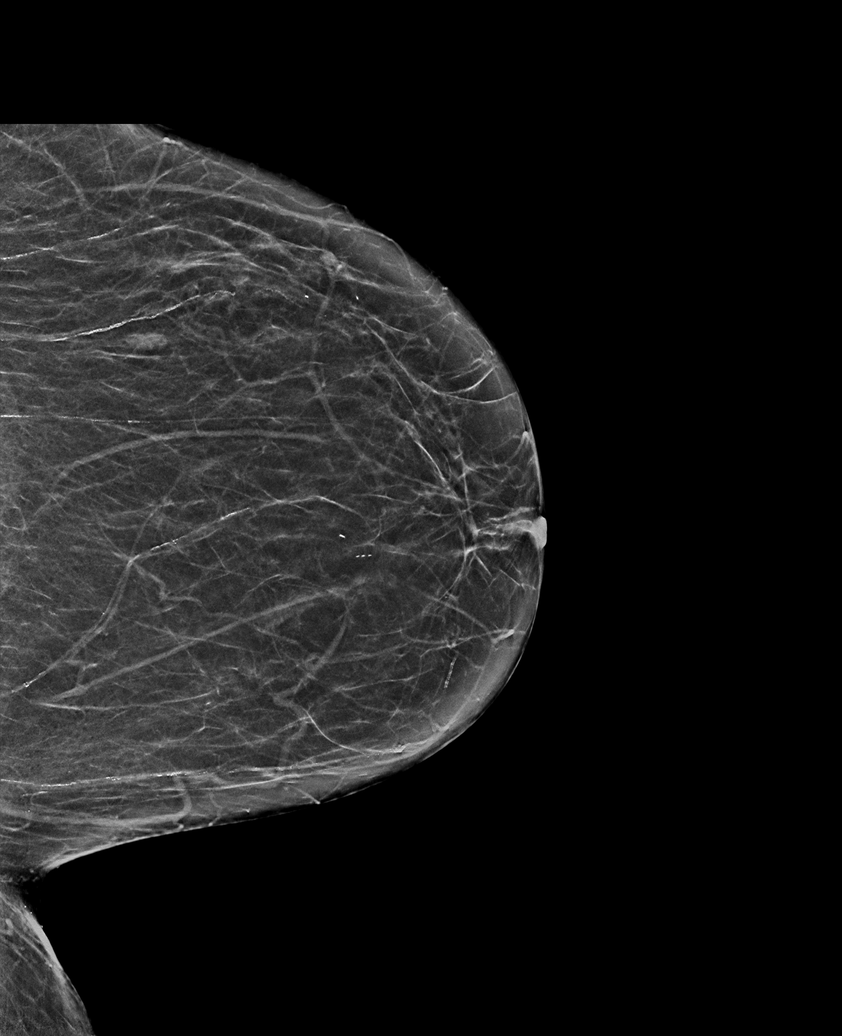

[L MLO synth-2D]
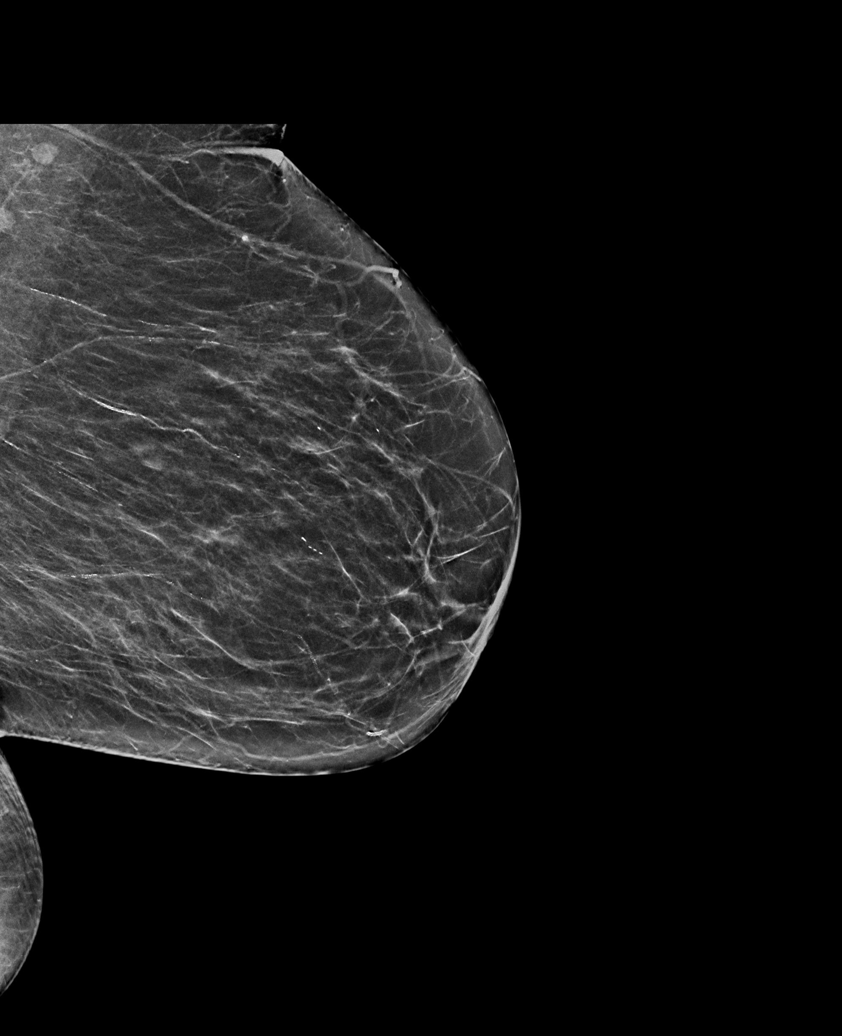

[R CC synth-2D]
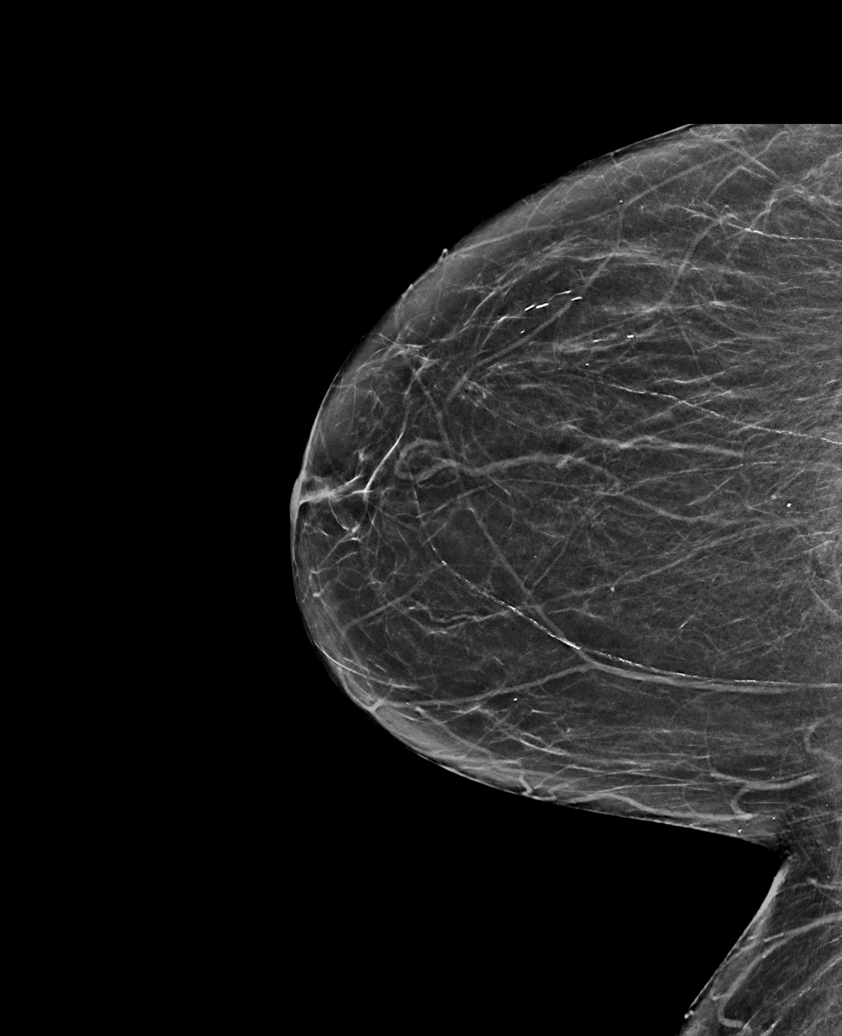

[R MLO tomo · tomo slice 29/56.0]
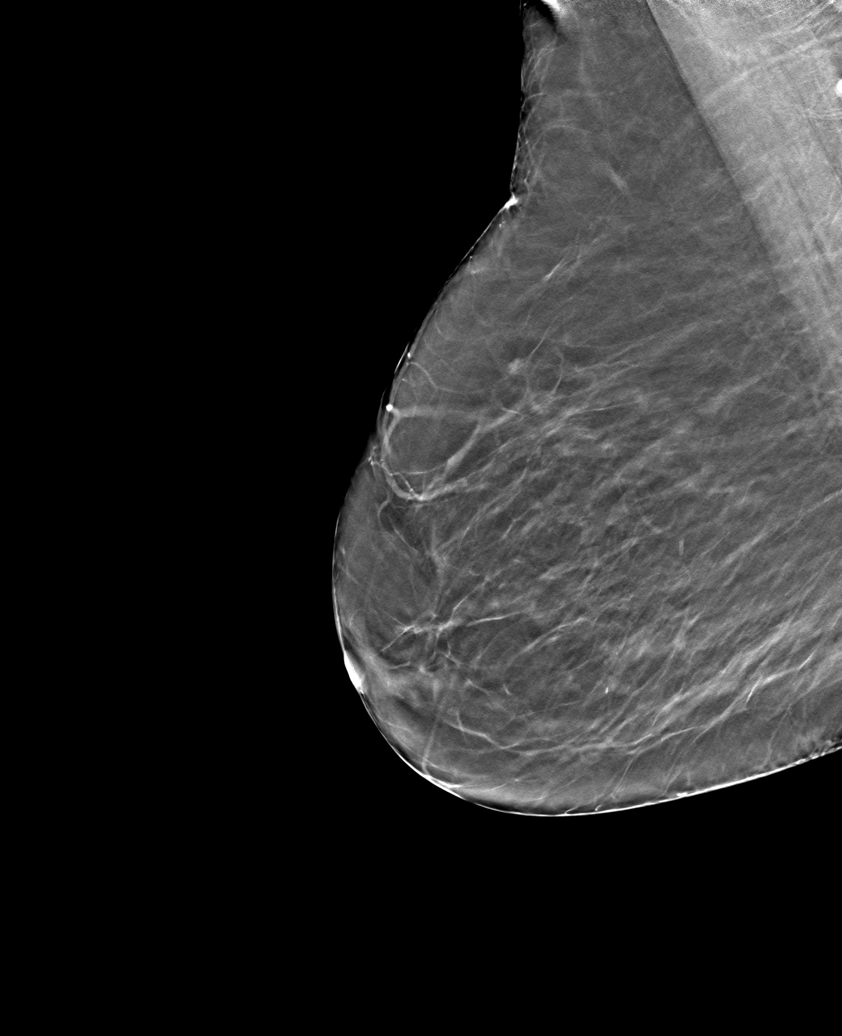

[6 of 30 positions shown; findings below may reference images not displayed]

ACR Breast Density Category b: There are scattered areas of
fibroglandular density.
FINDINGS: There are no findings suspicious for malignancy. Images were
processed with CAD.
IMPRESSION: No mammographic evidence of malignancy. A result letter of this
screening mammogram will be mailed directly to the patient.

RECOMMENDATION:
Screening mammogram in one year. (Code:[id number])

BI-RADS CATEGORY  1: Negative.

## 2020-04-20 NOTE — Telephone Encounter (Signed)
Patient advised and appointment made for 06/30/20

## 2020-05-23 ENCOUNTER — Other Ambulatory Visit: Payer: Self-pay | Admitting: *Deleted

## 2020-05-23 DIAGNOSIS — J0140 Acute pansinusitis, unspecified: Secondary | ICD-10-CM

## 2020-05-23 MED ORDER — FLUTICASONE PROPIONATE 50 MCG/ACT NA SUSP: 2.0000 | Freq: Every day | NASAL | 12 refills | Status: DC

## 2020-05-23 MED ORDER — METOPROLOL SUCCINATE ER 100 MG PO TB24: ORAL_TABLET | ORAL | 1 refills | Status: DC

## 2020-05-25 ENCOUNTER — Telehealth: Payer: Self-pay | Admitting: *Deleted

## 2020-05-25 NOTE — Telephone Encounter (Signed)
Agreed.  Thanks.  

## 2020-05-25 NOTE — Telephone Encounter (Signed)
Patient called to schedule an appointment and was transferred to triage because of symptoms. Patient stated that her ankles have been swelling off and on for several weeks. Patient also stated that she has had some SOB off and on for several weeks when trying to carry stuff. Patient stated that her ankles do go down overnight. Patient stated that she does not elevate her feet while sitting around so patient was encouraged to do that. Patient denies any SOB at this time. Patient stated that she has to schedule an appointment on a Tuesday or Thursday because her daughter has to bring her. Patient scheduled for an appointment with Dr. Damita Dunnings tomorrow 05/26/20. Patient was given ER precautions and she verbalized understanding.

## 2020-05-26 ENCOUNTER — Other Ambulatory Visit: Payer: Self-pay

## 2020-05-26 ENCOUNTER — Encounter: Payer: Self-pay | Admitting: Family Medicine

## 2020-05-26 ENCOUNTER — Ambulatory Visit (INDEPENDENT_AMBULATORY_CARE_PROVIDER_SITE_OTHER): Payer: Medicare HMO | Admitting: Family Medicine

## 2020-05-26 VITALS — BP 128/80 | HR 85 | Temp 96.3°F | Ht 62.0 in | Wt 134.3 lb

## 2020-05-26 DIAGNOSIS — R0602 Shortness of breath: Secondary | ICD-10-CM

## 2020-05-26 DIAGNOSIS — R300 Dysuria: Secondary | ICD-10-CM

## 2020-05-26 DIAGNOSIS — R609 Edema, unspecified: Secondary | ICD-10-CM | POA: Diagnosis not present

## 2020-05-26 DIAGNOSIS — Z79891 Long term (current) use of opiate analgesic: Secondary | ICD-10-CM

## 2020-05-26 DIAGNOSIS — F411 Generalized anxiety disorder: Secondary | ICD-10-CM

## 2020-05-26 MED ORDER — LORAZEPAM 1 MG PO TABS: ORAL_TABLET | ORAL | 0 refills | Status: DC

## 2020-05-26 MED ORDER — HYDROCODONE-ACETAMINOPHEN 7.5-325 MG PO TABS: 1.0000 | ORAL_TABLET | Freq: Two times a day (BID) | ORAL | 0 refills | Status: DC | PRN

## 2020-05-26 NOTE — Progress Notes (Signed)
This visit occurred during the SARS-CoV-2 public health emergency.  Safety protocols were in place, including screening questions prior to the visit, additional usage of staff PPE, and extensive cleaning of exam room while observing appropriate contact time as indicated for disinfecting solutions.  Her niece recently died, condolences offered.  She is still adjusting to the death of her husband, d/w pt.  Still on BZD at baseline with relief of anxiety.  No ADE on med.    She had her covid vaccine, d/w pt.    BLE edema.  Still on metoprolol BID.  Not on other BP meds or diuretic.  Intermittent BLE edema.  No more salt in diet than usual.  No CP. occ SOB with exertion and carrying a load.  Sleeping on 1 pillow but her head is elevated in her bed at baseline, the bed slopes up.  No CP.  Swelling is better now at the feet but still with some ankle edema.  She has been sitting more. Less edema in the AM, usually gone in the AM.  Weight is still stable.    Some dysuria and pressure with urination.    Still on hydrocodone at baseline for post shingles pain.  No ADE on med.  Compliant.    Meds, vitals, and allergies reviewed.   ROS: Per HPI unless specifically indicated in ROS section   GEN: nad, alert and oriented HEENT: ncat NECK: supple w/o LA CV: rrr PULM: ctab, no inc wob ABD: soft, +bs EXT: trace BLE edema SKIN: no acute rash Speech and affect normal

## 2020-05-26 NOTE — Patient Instructions (Signed)
Go to the lab on the way out.   If you have mychart we'll likely use that to update you.    Don't change your meds.  We'll be in touch.  Take care.  Glad to see you.

## 2020-05-27 ENCOUNTER — Other Ambulatory Visit: Payer: Self-pay | Admitting: Family Medicine

## 2020-05-27 DIAGNOSIS — R0600 Dyspnea, unspecified: Secondary | ICD-10-CM

## 2020-05-27 LAB — COMPREHENSIVE METABOLIC PANEL
ALT: 9 U/L (ref 0–35)
AST: 21 U/L (ref 0–37)
Albumin: 4.3 g/dL (ref 3.5–5.2)
Alkaline Phosphatase: 53 U/L (ref 39–117)
BUN: 21 mg/dL (ref 6–23)
CO2: 34 mEq/L — ABNORMAL HIGH (ref 19–32)
Calcium: 9.8 mg/dL (ref 8.4–10.5)
Chloride: 100 mEq/L (ref 96–112)
Creatinine, Ser: 1.05 mg/dL (ref 0.40–1.20)
GFR: 49.99 mL/min — ABNORMAL LOW (ref >60.00)
Glucose, Bld: 96 mg/dL (ref 70–99)
Potassium: 5.1 mEq/L (ref 3.5–5.1)
Sodium: 138 mEq/L (ref 135–145)
Total Bilirubin: 0.4 mg/dL (ref 0.2–1.2)
Total Protein: 7.5 g/dL (ref 6.0–8.3)

## 2020-05-27 LAB — URINALYSIS, ROUTINE W REFLEX MICROSCOPIC
Bilirubin Urine: NEGATIVE
Hgb urine dipstick: NEGATIVE
Ketones, ur: NEGATIVE
Leukocytes,Ua: NEGATIVE
Nitrite: NEGATIVE
Specific Gravity, Urine: 1.01 (ref 1.000–1.030)
Total Protein, Urine: NEGATIVE
Urine Glucose: NEGATIVE
Urobilinogen, UA: 0.2 (ref 0.0–1.0)
WBC, UA: NONE SEEN (ref 0–?)
pH: 7 (ref 5.0–8.0)

## 2020-05-27 LAB — CBC WITH DIFFERENTIAL/PLATELET
Basophils Absolute: 0.1 10*3/uL (ref 0.0–0.1)
Basophils Relative: 0.8 % (ref 0.0–3.0)
Eosinophils Absolute: 0.5 10*3/uL (ref 0.0–0.7)
Eosinophils Relative: 7.4 % — ABNORMAL HIGH (ref 0.0–5.0)
HCT: 41.3 % (ref 36.0–46.0)
Hemoglobin: 13.7 g/dL (ref 12.0–15.0)
Lymphocytes Relative: 24.7 % (ref 12.0–46.0)
Lymphs Abs: 1.7 10*3/uL (ref 0.7–4.0)
MCHC: 33 g/dL (ref 30.0–36.0)
MCV: 94.9 fl (ref 78.0–100.0)
Monocytes Absolute: 0.8 10*3/uL (ref 0.1–1.0)
Monocytes Relative: 11.8 % (ref 3.0–12.0)
Neutro Abs: 3.9 10*3/uL (ref 1.4–7.7)
Neutrophils Relative %: 55.3 % (ref 43.0–77.0)
Platelets: 226 10*3/uL (ref 150.0–400.0)
RBC: 4.36 Mil/uL (ref 3.87–5.11)
RDW: 13.5 % (ref 11.5–15.5)
WBC: 7.1 10*3/uL (ref 4.0–10.5)

## 2020-05-27 LAB — BRAIN NATRIURETIC PEPTIDE: Pro B Natriuretic peptide (BNP): 423 pg/mL — ABNORMAL HIGH (ref 0.0–100.0)

## 2020-05-27 MED ORDER — FUROSEMIDE 20 MG PO TABS
20.0000 mg | ORAL_TABLET | Freq: Every day | ORAL | 1 refills | Status: DC | PRN
Start: 2020-05-27 — End: 2021-10-23

## 2020-05-29 DIAGNOSIS — R609 Edema, unspecified: Secondary | ICD-10-CM | POA: Insufficient documentation

## 2020-05-29 NOTE — Assessment & Plan Note (Signed)
UA unremarkable.  See notes on labs.  I want her to update me if she has persistent symptoms.

## 2020-05-29 NOTE — Assessment & Plan Note (Addendum)
With some episodic shortness of breath.  She keeps the head of her bed elevated at baseline.  No chest pain and still okay for outpatient follow-up.  See notes on labs.  Based on labs, reasonable to get echo done.  Reasonable to try Lasix in the meantime.  At least 30 minutes were devoted to patient care in this encounter (this can potentially include time spent reviewing the patient's file/history, interviewing and examining the patient, counseling/reviewing plan with patient, ordering referrals, ordering tests, reviewing relevant laboratory or x-ray data, and documenting the encounter).

## 2020-05-29 NOTE — Assessment & Plan Note (Signed)
Condolences offered.  Continue as needed lorazepam with sedation caution.  She agrees.  She will update me as needed.

## 2020-05-29 NOTE — Assessment & Plan Note (Addendum)
[employer] reviewed.  No sedation on medication.  She does have some relief.  Continue as is.  She agrees.  Routine cautions given to patient.

## 2020-05-31 ENCOUNTER — Other Ambulatory Visit: Payer: Self-pay | Admitting: *Deleted

## 2020-05-31 MED ORDER — FLOVENT DISKUS 250 MCG/BLIST IN AEPB: INHALATION_SPRAY | RESPIRATORY_TRACT | 2 refills | Status: DC

## 2020-05-31 MED ORDER — METOPROLOL SUCCINATE ER 100 MG PO TB24: ORAL_TABLET | ORAL | 1 refills | Status: DC

## 2020-06-30 ENCOUNTER — Encounter: Payer: Self-pay | Admitting: Family Medicine

## 2020-06-30 ENCOUNTER — Ambulatory Visit (INDEPENDENT_AMBULATORY_CARE_PROVIDER_SITE_OTHER): Payer: Medicare HMO | Admitting: Family Medicine

## 2020-06-30 ENCOUNTER — Other Ambulatory Visit: Payer: Self-pay

## 2020-06-30 DIAGNOSIS — F411 Generalized anxiety disorder: Secondary | ICD-10-CM | POA: Diagnosis not present

## 2020-06-30 DIAGNOSIS — B0229 Other postherpetic nervous system involvement: Secondary | ICD-10-CM | POA: Diagnosis not present

## 2020-06-30 MED ORDER — HYDROCODONE-ACETAMINOPHEN 7.5-325 MG PO TABS: 1.0000 | ORAL_TABLET | Freq: Two times a day (BID) | ORAL | 0 refills | Status: DC | PRN

## 2020-06-30 NOTE — Progress Notes (Signed)
This visit occurred during the SARS-CoV-2 public health emergency.  Safety protocols were in place, including screening questions prior to the visit, additional usage of staff PPE, and extensive cleaning of exam room while observing appropriate contact time as indicated for disinfecting solutions.  Her younger daughters have health concerns, d/w pt.  She still misses her husband, d/w pt.  She has support from her daughter locally.  "I'm making it."    She still has some episodic SOB, ie with hurrying and carrying a load.  She is more sedentary.  Her weight is stable.  She is eating sweets but not getting a lot of protein- we talked about protein sources.  She didn't think her mood was affecting her appetite.  No recent lasix use- she has been elevating her legs.  She has echo pending.    She had her covid vaccine.    Indication for chronic opioid:chronic L flank pain from shingles prev Medication and dose:hydrocodone 7.5mg  BID, with extra half tab if needed # pills per month:75 Last UDS date:12/18/17 Pain contract signed (Y/N):yes Date narcotic database last reviewed (include red flags):06/30/20   Pain inventory (1-10) Average pain: 5/10 Pain now: it's okay now unless I touch it.   My pain is constant Pain is worse with compression on the L flank.  Relief from meds: some relief.    In the last 24 hours, how much has pain interfered with the following (1-10 greatest interference)? General activity- limitation mainly from covid Relationships with others- limitation mainly from covid Enjoyment of life - limitation mainly from covid What time of the day is the pain the worst- when pain meds wear off Sleep is described by patient as good but she is admittedly a "night owl" per patient report.    Mobility/function Assistance device: no How many minutes can you walk: as needed, can walk through grocery store.   Able to climb steps: yes, carefully  Driving: no Disabled:no  Bowel or  bladder symptoms: no Mood: see above.    Physicians involved in care: no Any changes since last visit? See above.    Meds, vitals, and allergies reviewed.   ROS: Per HPI unless specifically indicated in ROS section   GEN: nad, alert and oriented HEENT: ncat NECK: supple w/o LA CV: rrr PULM: ctab, no inc wob ABD: soft, +bs EXT: no edema SKIN: no acute rash L flank ttp at baseline, sensitive skin.

## 2020-06-30 NOTE — Patient Instructions (Signed)
Don't change your meds for now.   Higher protein foods- peanut butter, yogurt, meats, beans.   Take care.  Glad to see you.  Update me as needed.

## 2020-07-03 NOTE — Assessment & Plan Note (Signed)
Anxiety versus grief discussed with patient.  She has support from her daughter.  She has other daughters out-[place] who have been ill with cancer.  She still misses her husband.  She is trying to adjust to changes in her life as best she can.  No adverse effect on medications.  Continue lorazepam as needed for now.  She will update me as needed.

## 2020-07-03 NOTE — Assessment & Plan Note (Signed)
She had previous injection from the pain clinic without relief.  No change in pain medication at this point.  Not sedated.  She does get some relief from hydrocodone.  Continue as is.  She agrees.

## 2020-07-05 ENCOUNTER — Other Ambulatory Visit: Payer: Self-pay

## 2020-07-05 ENCOUNTER — Ambulatory Visit (INDEPENDENT_AMBULATORY_CARE_PROVIDER_SITE_OTHER): Payer: Medicare HMO

## 2020-07-05 DIAGNOSIS — R0600 Dyspnea, unspecified: Secondary | ICD-10-CM | POA: Diagnosis not present

## 2020-07-11 ENCOUNTER — Other Ambulatory Visit: Payer: Self-pay | Admitting: Family Medicine

## 2020-07-18 ENCOUNTER — Other Ambulatory Visit: Payer: Self-pay | Admitting: *Deleted

## 2020-07-19 ENCOUNTER — Other Ambulatory Visit: Payer: Self-pay | Admitting: *Deleted

## 2020-07-19 MED ORDER — MONTELUKAST SODIUM 10 MG PO TABS: 10.0000 mg | ORAL_TABLET | Freq: Every day | ORAL | 1 refills | Status: DC

## 2020-07-21 ENCOUNTER — Ambulatory Visit: Payer: Medicare HMO | Admitting: Cardiovascular Disease

## 2020-07-22 ENCOUNTER — Other Ambulatory Visit: Payer: Self-pay | Admitting: *Deleted

## 2020-08-29 ENCOUNTER — Other Ambulatory Visit: Payer: Self-pay | Admitting: Family Medicine

## 2020-08-29 DIAGNOSIS — Z1231 Encounter for screening mammogram for malignant neoplasm of breast: Secondary | ICD-10-CM

## 2020-09-19 DIAGNOSIS — H35371 Puckering of macula, right eye: Secondary | ICD-10-CM | POA: Diagnosis not present

## 2020-09-26 ENCOUNTER — Ambulatory Visit
Admission: RE | Admit: 2020-09-26 | Discharge: 2020-09-26 | Disposition: A | Discharge status: Home / Self Care | Payer: Medicare HMO | Source: Ambulatory Visit | Attending: Family Medicine | Admitting: Family Medicine

## 2020-09-26 ENCOUNTER — Other Ambulatory Visit: Payer: Self-pay

## 2020-09-26 DIAGNOSIS — Z1231 Encounter for screening mammogram for malignant neoplasm of breast: Secondary | ICD-10-CM | POA: Diagnosis not present

## 2020-09-30 ENCOUNTER — Ambulatory Visit
Admission: EM | Admit: 2020-09-30 | Discharge: 2020-09-30 | Disposition: A | Discharge status: Home / Self Care | Payer: Medicare HMO | Attending: Family Medicine | Admitting: Family Medicine

## 2020-09-30 ENCOUNTER — Telehealth: Payer: Self-pay

## 2020-09-30 DIAGNOSIS — J3089 Other allergic rhinitis: Secondary | ICD-10-CM | POA: Diagnosis not present

## 2020-09-30 DIAGNOSIS — R0982 Postnasal drip: Secondary | ICD-10-CM

## 2020-09-30 MED ORDER — LEVOCETIRIZINE DIHYDROCHLORIDE 5 MG PO TABS: 5.0000 mg | ORAL_TABLET | Freq: Every evening | ORAL | 0 refills | Status: DC

## 2020-09-30 NOTE — ED Triage Notes (Signed)
Pt presents with complaints of sore throat x 1 week. Reports her throat is red. Denies any cough or fever. Reports sinus drainage from allergies as well.

## 2020-09-30 NOTE — ED Provider Notes (Signed)
[facility]    CSN: [id number] Arrival date & time: 09/30/20  1145      History   Chief Complaint Chief Complaint  Patient presents with  . Sore Throat    HPI Jillian Oconnell is a 86 y.o. female.   Reports that she has been having a lot of sinus drainage and sore throat for the last week. Reports that she takes singulair and uses an inhaler for allergies usually. Has used claritin in the past with little relief. Denies fever, headache, cough, nausea, vomiting, diarrhea, rash, fever, other symptoms.  ROS per HPI  The history is provided by the patient.  Sore Throat    Past Medical History:  Diagnosis Date  . Allergy   . Anxiety   . Asthma   . Concussion    with fall at home 10/2016  . Depression   . GERD (gastroesophageal reflux disease)   . Glaucoma   . Hyperlipidemia   . Hypertension   . IBS (irritable bowel syndrome)   . OA (osteoarthritis) of knee    injections  . Osteopenia    DXA 2011  . Palpitations   . Paroxysmal supraventricular tachycardia ([place]) May of 2005  . 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 Shingles    post herpetic neuralgia (pain clinic in past)    Patient Active Problem List   Diagnosis Date Noted  . Edema 05/29/2020  . Hyperkalemia 08/13/2019  . Medicare annual wellness visit, subsequent 08/03/2018  . Hemorrhoids 07/02/2018  . Chronic use of opiate for therapeutic purpose 01/26/2018  . History of UTI 09/20/2017  . Advance care planning 04/28/2017  . Hearing loss 03/24/2017  . Headache 01/24/2017  . Fall at home 10/28/2016  . Lower back pain 03/28/2016  . Symptomatic PVCs 02/06/2016  . Impingement syndrome of right shoulder 07/20/2015  . Breast cancer screening 04/28/2015  . Paroxysmal supraventricular tachycardia ([place])   . Pain in joint, shoulder region 11/09/2013  . Exertional chest pain 01/06/2013  . Back pain 10/12/2012  . Dysuria 08/10/2011  . CARPAL TUNNEL SYNDROME 01/09/2011  .  Osteoporosis 09/06/2010  . Depression 03/16/2009  . 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 HLD (hyperlipidemia) 02/23/2009  . Anxiety state 02/23/2009  . HTN (hypertension) 02/23/2009  . Allergic rhinitis 02/23/2009  . Asthma 02/23/2009  . GERD 02/23/2009    Past Surgical History:  Procedure Laterality Date  . BREAST EXCISIONAL BIOPSY Right 1988   NEG  . CARDIAC CATHETERIZATION  01/2008   65% ;[place]. Minor luminal irregularities with no evidence of obstructive disease.  . cataract surgery    . [place]  . RECTOCELE REPAIR  2004  . TOTAL ABDOMINAL HYSTERECTOMY      OB History    Gravida  3   Para      Term      Preterm      AB      Living  3     SAB      TAB      Ectopic      Multiple      Live Births  3            Home Medications    Prior to Admission medications   Medication Sig Start Date End Date Taking? Authorizing Provider  albuterol (PROVENTIL HFA;VENTOLIN HFA) 108 (90 Base) MCG/ACT inhaler Inhale 1-2 puffs into the lungs every 6 (six) hours  as needed for wheezing or shortness of breath. 03/31/18   Tonia Ghent, MD  Calcium Carbonate-Vitamin D (CALTRATE 600+D) 600-400 MG-UNIT per tablet Take 1 tablet by mouth two times a day    [provider]  fluticasone (FLONASE) 50 MCG/ACT nasal spray Place 2 sprays into both nostrils daily. 05/23/20   Tonia Ghent, MD  Fluticasone Propionate, Inhal, (FLOVENT DISKUS) 250 MCG/BLIST AEPB INHALE 2 PUFFS IN THE MORNING AND INHALE 2 PUFFS IN THE EVENING (RINSE AFTER USE) 07/11/20   Tonia Ghent, MD  furosemide (LASIX) 20 MG tablet Take 1 tablet (20 mg total) by mouth daily as needed for fluid. 05/27/20   Tonia Ghent, MD  HYDROcodone-acetaminophen (NORCO) 7.5-325 MG tablet Take 1 tablet by mouth 2 (two) times daily as needed (for pain. with extra half tab if needed. fill on/after 08/29/20). 06/30/20   Tonia Ghent, MD  HYDROcodone-acetaminophen (NORCO) 7.5-325 MG tablet Take 1  tablet by mouth 2 (two) times daily as needed (for pain. with extra half tab if needed.  fill on/after 07/30/20). 06/30/20   Tonia Ghent, MD  HYDROcodone-acetaminophen (NORCO) 7.5-325 MG tablet Take 1 tablet by mouth 2 (two) times daily as needed (for pain. with extra half tab if needed.). 06/30/20   Tonia Ghent, MD  levocetirizine (XYZAL) 5 MG tablet Take 1 tablet (5 mg total) by mouth every evening. 09/30/20   Faustino Congress, NP  Lidocaine 2 % GEL Apply 1 application topically daily as needed (for hemorrhoids if needed for pain). 07/01/18   Tonia Ghent, MD  LORazepam (ATIVAN) 1 MG tablet 1/2 tab a twice a day if needed. Sedation caution. 05/26/20   Tonia Ghent, MD  metoprolol succinate (TOPROL-XL) 100 MG 24 hr tablet TAKE 1 TABLET BY MOUTH TWICE DAILY WITH  OR  IMMEDIATELY  FOLLOWING  A  MEAL 05/31/20   Tonia Ghent, MD  montelukast (SINGULAIR) 10 MG tablet Take 1 tablet (10 mg total) by mouth at bedtime. 07/19/20   Tonia Ghent, MD  triamcinolone cream (KENALOG) 0.1 % Apply 1 application topically 2 (two) times daily. 05/19/19   Tonia Ghent, MD    Family History Family History  Problem Relation Age of Onset  . Hypertension Mother   . Breast cancer Mother 15  . Cancer Sister        colon  . Colon cancer Sister   . Hypertension Father   . Aneurysm Father        AAA  . Cancer Daughter        thyroid  . Anxiety disorder Daughter   . Breast cancer Daughter 56  . Breast cancer Daughter     Social History Social History   Tobacco Use  . Smoking status: Never Smoker  . Smokeless tobacco: Never Used  Vaping Use  . Vaping Use: Never used  Substance Use Topics  . Alcohol use: No    Alcohol/week: 0.0 standard drinks  . Drug use: No     Allergies   Albuterol, Alendronate sodium, Amoxicillin-pot clavulanate, Atorvastatin, Azithromycin, Ezetimibe-simvastatin, Gabapentin, Ibandronate sodium, Lexapro [escitalopram oxalate], Lyrica [pregabalin], Neosporin  [bacitracin-polymyxin b], Risedronate sodium, Rosuvastatin, Sertraline, and Sulfonamide derivatives   Review of Systems Review of Systems   Physical Exam Triage Vital Signs ED Triage Vitals  Enc Vitals Group     BP 09/30/20 1148 (!) 148/81     Pulse Rate 09/30/20 1148 77     Resp 09/30/20 1148 19     Temp  09/30/20 1148 98.6 F (37 C)     Temp src --      SpO2 09/30/20 1148 94 %     Weight --      Height --      Head Circumference --      Peak Flow --      Pain Score 09/30/20 1146 5     Pain Loc --      Pain Edu? --      Excl. in [place]? --    No data found.  Updated Vital Signs BP (!) 148/81   Pulse 77   Temp 98.6 F (37 C)   Resp 19   SpO2 94%   Visual Acuity Right Eye Distance:   Left Eye Distance:   Bilateral Distance:    Right Eye Near:   Left Eye Near:    Bilateral Near:     Physical Exam Vitals and nursing note reviewed.  Constitutional:      General: She is not in acute distress.    Appearance: She is well-developed. She is not ill-appearing.  HENT:     Head: Normocephalic and atraumatic.     Right Ear: Tympanic membrane normal.     Left Ear: Tympanic membrane normal.     Nose: Rhinorrhea (clear) present. No congestion.     Mouth/Throat:     Mouth: Mucous membranes are moist.     Pharynx: Posterior oropharyngeal erythema present.     Comments: Cobblestoning present Eyes:     Conjunctiva/sclera: Conjunctivae normal.  Cardiovascular:     Rate and Rhythm: Normal rate. Rhythm irregular.     Heart sounds: No murmur heard.   Pulmonary:     Effort: Pulmonary effort is normal. No respiratory distress.     Breath sounds: Normal breath sounds.  Abdominal:     Palpations: Abdomen is soft.     Tenderness: There is no abdominal tenderness.  Musculoskeletal:     Cervical back: Normal range of motion and neck supple.  Skin:    General: Skin is warm and dry.     Capillary Refill: Capillary refill takes less than 2 seconds.  Neurological:     General:  No focal deficit present.     Mental Status: She is alert and oriented to person, place, and time.  Psychiatric:        Mood and Affect: Mood normal.        Behavior: Behavior normal.      UC Treatments / Results  Labs (all labs ordered are listed, but only abnormal results are displayed) Labs Reviewed - No data to display  EKG   Radiology No results found.  Procedures Procedures (including critical care time)  Medications Ordered in UC Medications - No data to display  Initial Impression / Assessment and Plan / UC Course  I have reviewed the triage vital signs and the nursing notes.  Pertinent labs & imaging results that were available during my care of the patient were reviewed by me and considered in my medical decision making (see chart for details).     Allergic Rhinitis Post Nasal Drip  Presents with one week of increased post nasal drip  Prescribed xyzal May use flonase May use plain mucinex Continue home medication regimen Follow up as needed  Final Clinical Impressions(s) / UC Diagnoses   Final diagnoses:  Seasonal allergic rhinitis due to other allergic trigger  Post-nasal drip     Discharge Instructions     I have sent in  the generic of xyzal for you to take daily  Take plain mucinex for post nasal drip and congestion  Use flonase 2 sprays in each nostril daily for 2 weeks  May use Corcidin HBP as needed  Follow up with this office or with primary care as needed    ED Prescriptions    Medication Sig Dispense Auth. Provider   levocetirizine (XYZAL) 5 MG tablet Take 1 tablet (5 mg total) by mouth every evening. 30 tablet Faustino Congress, NP     PDMP not reviewed this encounter.   Faustino Congress, NP 09/30/20 1235

## 2020-09-30 NOTE — Telephone Encounter (Signed)
Jillian Oconnell (DPR signed) pt has had S/T for 3-4 days; now throat is swollen and hard to swallow; no fever and no difficulty breathing. No available appts at [facility] and Jillian Oconnell will take pt to [facility] in [place]. FYI to Dr Damita Dunnings.

## 2020-09-30 NOTE — Telephone Encounter (Signed)
Noted. Thanks.

## 2020-09-30 NOTE — Discharge Instructions (Addendum)
I have sent in the generic of xyzal for you to take daily  Take plain mucinex for post nasal drip and congestion  Use flonase 2 sprays in each nostril daily for 2 weeks  May use Corcidin HBP as needed  Follow up with this office or with primary care as needed

## 2020-10-18 ENCOUNTER — Other Ambulatory Visit: Payer: Self-pay

## 2020-10-18 ENCOUNTER — Ambulatory Visit: Payer: Medicare HMO | Admitting: Dermatology

## 2020-10-18 ENCOUNTER — Encounter: Payer: Self-pay | Admitting: Dermatology

## 2020-10-18 DIAGNOSIS — L570 Actinic keratosis: Secondary | ICD-10-CM

## 2020-10-18 DIAGNOSIS — Z85828 Personal history of other malignant neoplasm of skin: Secondary | ICD-10-CM | POA: Diagnosis not present

## 2020-10-18 DIAGNOSIS — L578 Other skin changes due to chronic exposure to nonionizing radiation: Secondary | ICD-10-CM | POA: Diagnosis not present

## 2020-10-18 DIAGNOSIS — L821 Other seborrheic keratosis: Secondary | ICD-10-CM

## 2020-10-18 DIAGNOSIS — L820 Inflamed seborrheic keratosis: Secondary | ICD-10-CM

## 2020-10-18 NOTE — Progress Notes (Signed)
   Follow-Up Visit   Subjective  Jillian Oconnell is a 86 y.o. female who presents for the following: Spots (face, left upper arm. Some irritating and have bled.). 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   The following portions of the chart were reviewed this encounter and updated as appropriate:      Review of Systems:  No other skin or systemic complaints except as noted in HPI or Assessment and Plan.  Objective  Well appearing patient in no apparent distress; mood and affect are within normal limits.  A focused examination was performed including face, arms. Relevant physical exam findings are noted in the Assessment and Plan.  Objective  Left Upper Arm x 1, Left Forearm x 1, Nose x 4, L cheek x 1 (7): Keratotic papules.  Objective  Face: Stuck-on, waxy, tan-brown patches, papules --Discussed benign etiology and prognosis.   Objective  Right Forearm x 1: Erythematous keratotic or waxy stuck-on papule    Assessment & Plan    Actinic Damage - diffuse scaly erythematous macules with underlying dyspigmentation - Recommend daily broad spectrum sunscreen SPF 30+ to sun-exposed areas, reapply every 2 hours as needed.  - Call for new or changing lesions.  History of Squamous Cell Carcinoma of the Skin - No evidence of recurrence today of the right preauricular - Recommend regular full body skin exams - Recommend daily broad spectrum sunscreen SPF 30+ to sun-exposed areas, reapply every 2 hours as needed.  - Call if any new or changing lesions are noted between office visits   AK (actinic keratosis) (7) Left Upper Arm x 1, Left Forearm x 1, Nose x 4, L cheek x 1  Hypertrophic AKs vs ISKs  Destruction of lesion - Left Upper Arm x 1, Left Forearm x 1, Nose x 4, L cheek x 1  Destruction method: cryotherapy   Informed consent: discussed and consent obtained   Lesion destroyed using liquid nitrogen: Yes   Region frozen until ice ball extended beyond  lesion: Yes   Outcome: patient tolerated procedure well with no complications   Post-procedure details: wound care instructions given    Seborrheic keratosis Face  Reassured benign age-related growth.  Recommend observation.  Discussed cryotherapy if spot(s) become irritated or inflamed.   Eucerin Roughness Relief Lotion, sample given. Spot treat areas to help smooth.  Inflamed seborrheic keratosis Right Forearm x 1  Destruction of lesion - Right Forearm x 1  Destruction method: cryotherapy   Informed consent: discussed and consent obtained   Lesion destroyed using liquid nitrogen: Yes   Region frozen until ice ball extended beyond lesion: Yes   Outcome: patient tolerated procedure well with no complications   Post-procedure details: wound care instructions given    Return if symptoms worsen or fail to improve.   IJamesetta Orleans, CMA, am acting as scribe for Brendolyn Patty, MD .  Documentation: I have reviewed the above documentation for accuracy and completeness, and I agree with the above.  Brendolyn Patty MD

## 2020-10-18 NOTE — Patient Instructions (Signed)
Cryotherapy Aftercare  . Wash gently with soap and water everyday.   Marland Kitchen Apply Vaseline and Band-Aid daily until healed.   Eucerin Roughness Relief Lotion - Spot treat rough areas on face to help smooth out.

## 2020-10-24 ENCOUNTER — Other Ambulatory Visit: Payer: Self-pay | Admitting: Family Medicine

## 2020-10-24 MED ORDER — HYDROCODONE-ACETAMINOPHEN 7.5-325 MG PO TABS
1.0000 | ORAL_TABLET | Freq: Two times a day (BID) | ORAL | 0 refills | Status: DC | PRN
Start: 2020-10-24 — End: 2021-02-15

## 2020-10-24 NOTE — Telephone Encounter (Signed)
HYDROcodone-acetaminophen (NORCO) 7.5-325 MG tablet 75 tablet    Last OV and last filled 06/30/2020 with 2 addit refills... please advise

## 2020-10-24 NOTE — Telephone Encounter (Signed)
Pt called in needed to get a refill of her Hydrocodone.  [place]

## 2020-10-24 NOTE — Telephone Encounter (Signed)
Sent. Thanks.   

## 2020-10-25 NOTE — Telephone Encounter (Signed)
Patient notified VIA phone. Dm/cma  

## 2020-11-21 ENCOUNTER — Other Ambulatory Visit: Payer: Self-pay | Admitting: Family Medicine

## 2020-11-21 NOTE — Telephone Encounter (Signed)
Sent. Thanks.   

## 2020-11-21 NOTE — Telephone Encounter (Signed)
Pharmacy requests refill on: Lorazepam 1 mg   LAST REFILL: 05/26/2020 (Q-90, R-0)  LAST OV: 06/30/2020 NEXT OV: 11/25/2020 PHARMACY: [id number] [place], [place]

## 2020-11-25 ENCOUNTER — Encounter: Payer: Self-pay | Admitting: Family Medicine

## 2020-11-25 ENCOUNTER — Ambulatory Visit (INDEPENDENT_AMBULATORY_CARE_PROVIDER_SITE_OTHER): Payer: Medicare HMO | Admitting: Family Medicine

## 2020-11-25 ENCOUNTER — Other Ambulatory Visit: Payer: Self-pay

## 2020-11-25 VITALS — BP 170/84 | HR 96 | Temp 96.9°F

## 2020-11-25 DIAGNOSIS — F411 Generalized anxiety disorder: Secondary | ICD-10-CM | POA: Diagnosis not present

## 2020-11-25 DIAGNOSIS — K219 Gastro-esophageal reflux disease without esophagitis: Secondary | ICD-10-CM

## 2020-11-25 DIAGNOSIS — B0229 Other postherpetic nervous system involvement: Secondary | ICD-10-CM

## 2020-11-25 DIAGNOSIS — M549 Dorsalgia, unspecified: Secondary | ICD-10-CM

## 2020-11-25 DIAGNOSIS — R202 Paresthesia of skin: Secondary | ICD-10-CM

## 2020-11-25 DIAGNOSIS — Z23 Encounter for immunization: Secondary | ICD-10-CM

## 2020-11-25 MED ORDER — LANSOPRAZOLE 15 MG PO CPDR: 15.0000 mg | DELAYED_RELEASE_CAPSULE | Freq: Every day | ORAL | 1 refills | Status: DC

## 2020-11-25 NOTE — Patient Instructions (Signed)
Continue your pain medicine as you have been taking it.  Flu shot today.  We'll update you about the labs.  Use the back exercises and update me if that isn't helping.  Take care.  Glad to see you.

## 2020-11-25 NOTE — Progress Notes (Signed)
This visit occurred during the SARS-CoV-2 public health emergency.  Safety protocols were in place, including screening questions prior to the visit, additional usage of staff PPE, and extensive cleaning of exam room while observing appropriate contact time as indicated for disinfecting solutions.  She had covid booster.  D/w pt.    Flu shot today.    Burning in the stomach.  Cramping.  Episodic.  Not worse with eating.  Prevacid used to help but is off med.  Some nausea.  No blood in stool.  No nsaids.  Stressors d/w pt.  His grandson has some troubles.  Her daughter may end up caring for the patient's great grandson.  Still with shingles pain.  Some days worse than others.  No ADE on med.  Compliant.  Not sedated.    Also with sciatica pain.  L lower back, radiating down the L leg.  No R sided sx.  No trauma.  She has a separate issue with BLE tingling.  No weakness.    Meds, vitals, and allergies reviewed.   ROS: Per HPI unless specifically indicated in ROS section   GEN: nad, alert and oriented HEENT: NCAT NECK: supple w/o LA CV: rrr.   PULM: ctab, no inc wob ABD: soft, +bs EXT: no edema  SKIN: Well-perfused Left lower back tender to palpation. Decreased sensation to vibration in the left foot.  Normal sensation to monofilament of left foot.  Normal sensation to monofilament and vibration on the right foot.  Normal dorsalis pedis pulses. Left straight leg raise positive.  No lower extremity weakness.

## 2020-11-26 LAB — COMPREHENSIVE METABOLIC PANEL
AG Ratio: 1.4 (calc) (ref 1.0–2.5)
ALT: 12 U/L (ref 6–29)
AST: 20 U/L (ref 10–35)
Albumin: 4 g/dL (ref 3.6–5.1)
Alkaline phosphatase (APISO): 48 U/L (ref 37–153)
BUN/Creatinine Ratio: 22 (calc) (ref 6–22)
BUN: 25 mg/dL (ref 7–25)
CO2: 30 mmol/L (ref 20–32)
Calcium: 9.6 mg/dL (ref 8.6–10.4)
Chloride: 101 mmol/L (ref 98–110)
Creat: 1.13 mg/dL — ABNORMAL HIGH (ref 0.60–0.88)
Globulin: 2.8 g/dL (calc) (ref 1.9–3.7)
Glucose, Bld: 103 mg/dL — ABNORMAL HIGH (ref 65–99)
Potassium: 5.3 mmol/L (ref 3.5–5.3)
Sodium: 141 mmol/L (ref 135–146)
Total Bilirubin: 0.4 mg/dL (ref 0.2–1.2)
Total Protein: 6.8 g/dL (ref 6.1–8.1)

## 2020-11-26 LAB — CBC WITH DIFFERENTIAL/PLATELET
Absolute Monocytes: 645 cells/uL (ref 200–950)
Basophils Absolute: 60 cells/uL (ref 0–200)
Basophils Relative: 1.2 %
Eosinophils Absolute: 575 cells/uL — ABNORMAL HIGH (ref 15–500)
Eosinophils Relative: 11.5 %
HCT: 41.4 % (ref 35.0–45.0)
Hemoglobin: 13.8 g/dL (ref 11.7–15.5)
Lymphs Abs: 1470 cells/uL (ref 850–3900)
MCH: 30.6 pg (ref 27.0–33.0)
MCHC: 33.3 g/dL (ref 32.0–36.0)
MCV: 91.8 fL (ref 80.0–100.0)
MPV: 10.6 fL (ref 7.5–12.5)
Monocytes Relative: 12.9 %
Neutro Abs: 2250 cells/uL (ref 1500–7800)
Neutrophils Relative %: 45 %
Platelets: 228 10*3/uL (ref 140–400)
RBC: 4.51 10*6/uL (ref 3.80–5.10)
RDW: 12.5 % (ref 11.0–15.0)
Total Lymphocyte: 29.4 %
WBC: 5 10*3/uL (ref 3.8–10.8)

## 2020-11-26 LAB — TSH: TSH: 3.12 mIU/L (ref 0.40–4.50)

## 2020-11-26 LAB — VITAMIN B12: Vitamin B-12: 447 pg/mL (ref 200–1100)

## 2020-11-27 DIAGNOSIS — R202 Paresthesia of skin: Secondary | ICD-10-CM | POA: Insufficient documentation

## 2020-11-27 NOTE — Assessment & Plan Note (Signed)
See above regarding her great grandson.  Discussed with patient.  She is safe at home.  She will update me as needed.

## 2020-11-27 NOTE — Assessment & Plan Note (Signed)
With sciatica symptoms.  Discussed home exercises and stretching.  Handout given and explained.  She will update me if this not helping.

## 2020-11-27 NOTE — Assessment & Plan Note (Signed)
Would restart Prevacid.  Routine cautions given to patient.  She will let me know if that does not help.

## 2020-11-27 NOTE — Assessment & Plan Note (Signed)
Continue hydrocodone at baseline.  No sedation.  She agrees.

## 2020-11-27 NOTE — Assessment & Plan Note (Signed)
Appears to be a separate issue from her sciatica.  This is reported in the bilateral feet but she only has decrease in vibration on the left foot.  No weakness.  Okay for outpatient follow-up.

## 2020-11-29 ENCOUNTER — Ambulatory Visit: Payer: Medicare HMO | Admitting: Cardiovascular Disease

## 2020-11-29 ENCOUNTER — Other Ambulatory Visit: Payer: Self-pay

## 2020-11-29 ENCOUNTER — Encounter: Payer: Self-pay | Admitting: Cardiovascular Disease

## 2020-11-29 VITALS — BP 116/80 | HR 81 | Ht <= 58 in | Wt 132.0 lb

## 2020-11-29 DIAGNOSIS — I10 Essential (primary) hypertension: Secondary | ICD-10-CM

## 2020-11-29 DIAGNOSIS — R000 Tachycardia, unspecified: Secondary | ICD-10-CM | POA: Diagnosis not present

## 2020-11-29 DIAGNOSIS — I493 Ventricular premature depolarization: Secondary | ICD-10-CM | POA: Diagnosis not present

## 2020-11-29 NOTE — Progress Notes (Signed)
Cardiology Office Note   Date:  11/29/2020   ID:  Jillian Oconnell, DOB 29-Jun-1934, MRN [id number]  PCP:  Tonia Ghent, MD  Cardiologist:   Kathlyn Sacramento, MD   Chief Complaint  Patient presents with   Other    6 month follow up. Patient c/o swelling and SOB. Meds reviewed verbally with patient.       History of Present Illness: Jillian Oconnell is a 86 y.o. female who presents for a followup visit regarding sinus tachycardia and frequent PVCs.   She had previous cardiac catheterization in February of 2009 which showed minor luminal irregularities with no evidence of obstructive disease. Ejection fraction was normal. Most recent echocardiogram in 09/2016 showed normal LV systolic function, No significant valvular abnormalities and normal pulmonary pressure.   Most recent Holter monitor in October 2017 showed very frequent PVCs with a total of 34,000 beats representing 25% burden.   She improved significantly with metoprolol which was gradually increased to maximal dose.    She has been doing well overall with no recent chest pain or worsening dyspnea.  No palpitations.  No dizziness or syncope.  She complains of some numbness and discomfort in both feet.     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 Minor luminal irregularities with no evidence of obstructive disease.   cataract surgery     [place]  2004   TOTAL ABDOMINAL HYSTERECTOMY       Current Outpatient Medications  Medication Sig Dispense Refill   albuterol (PROVENTIL HFA;VENTOLIN HFA) 108 (90 Base) MCG/ACT inhaler Inhale 1-2 puffs into the lungs every 6 (six) hours as needed for wheezing or shortness of breath. 1 Inhaler 0   Calcium Carbonate-Vitamin D 600-400 MG-UNIT tablet Take 1 tablet by mouth two times a day     fluticasone (FLONASE) 50 MCG/ACT nasal spray Place 2 sprays into both nostrils daily. 16 g 12   Fluticasone Propionate, Inhal, (FLOVENT DISKUS) 250 MCG/BLIST AEPB INHALE 2 PUFFS IN THE MORNING AND INHALE 2 PUFFS IN THE EVENING (RINSE AFTER USE) 180 each 3   furosemide (LASIX) 20 MG tablet Take 1 tablet (20 mg total) by mouth daily as needed for fluid. 30 tablet 1   HYDROcodone-acetaminophen (NORCO) 7.5-325 MG tablet Take 1 tablet by mouth 2 (two) times daily as needed (for pain. with extra half tab if needed. fill on/after 12/23/20). 75 tablet 0   HYDROcodone-acetaminophen (NORCO) 7.5-325 MG tablet Take 1 tablet by mouth 2 (two) times daily  as needed (for pain. with extra half tab if needed.  fill on/after 11/23/20). 75 tablet 0   HYDROcodone-acetaminophen (NORCO) 7.5-325 MG tablet Take 1 tablet by mouth 2 (two) times daily as needed (for pain. with extra half tab if needed.). 75 tablet 0   lansoprazole (PREVACID) 15 MG capsule Take 1 capsule (15 mg total) by mouth daily. 90 capsule 1   levocetirizine (XYZAL) 5 MG tablet Take 1 tablet (5 mg total) by mouth every evening. 30 tablet 0   Lidocaine 2 % GEL Apply 1 application topically daily as needed (for hemorrhoids if needed for pain). 30 g 1   LORazepam (ATIVAN) 1 MG tablet TAKE 1/2 (ONE-HALF) TABLET BY MOUTH TWICE DAILY AS NEEDED (SEDATION  CAUTION) 90 tablet 0   metoprolol succinate (TOPROL-XL) 100 MG 24 hr tablet TAKE 1 TABLET BY MOUTH TWICE DAILY WITH  OR  IMMEDIATELY  FOLLOWING  A  MEAL 180 tablet 1   montelukast (SINGULAIR) 10 MG tablet Take 1 tablet (10 mg total) by mouth at bedtime. 90 tablet 1   triamcinolone cream (KENALOG) 0.1 % Apply 1 application topically 2 (two) times daily. 30 g 0   No current facility-administered medications for this visit.    Allergies:   Albuterol, Alendronate sodium, Amoxicillin-pot clavulanate, Atorvastatin, Azithromycin, Ezetimibe-simvastatin, Gabapentin, Ibandronate sodium, Lexapro [escitalopram oxalate], Lyrica [pregabalin], Neosporin [bacitracin-polymyxin b], Risedronate sodium, Rosuvastatin, Sertraline, and Sulfonamide derivatives    Social History:  The patient  reports that she has never smoked. She has never used smokeless tobacco. She reports that she does not drink alcohol and does not use drugs.   Family History:  [name] family history includes Aneurysm in her father; Anxiety disorder in her daughter; Breast cancer in her daughter; Breast cancer (age of onset: 73) in her daughter; Breast cancer (age of onset: 64) in her mother; Cancer in her daughter and sister; Colon cancer in her sister; Hypertension in her father and mother.    ROS:  Please see the history of present illness.   Otherwise, review of systems are positive for none.   All other systems are reviewed and negative.    PHYSICAL EXAM: VS:  BP 116/80 (BP Location: Right Arm, Patient Position: Sitting, Cuff Size: Normal)    Pulse 81    Ht 4\' 10"  (1.473 m)    Wt 132 lb (59.9 kg)    BMI 27.59 kg/m  , BMI Body mass index is 27.59 kg/m. GEN: Well nourished, well developed, in no acute distress  HEENT: normal  Neck: no JVD, carotid bruits, or masses Cardiac: RRR ; no murmurs, rubs, or gallops,no edema  Respiratory:  clear to auscultation bilaterally, normal work of breathing GI: soft, nontender, nondistended, +  BS MS: no deformity or atrophy  Skin: warm and dry, no rash Neuro:  Strength and sensation are intact Psych: euthymic mood, full affect Vascular: Pedal pulses are palpable.  EKG:  EKG is  ordered today. EKG showed normal sinus rhythm with no significant ST or T wave changes.  Recent Labs: 05/26/2020: Pro B Natriuretic peptide (BNP) 423.0 11/25/2020: ALT 12; BUN 25; Creat 1.13; Hemoglobin 13.8; Platelets 228; Potassium 5.3; Sodium 141; TSH 3.12    Lipid Panel    Component Value Date/Time   CHOL 202 (H) 08/07/2019 1507   TRIG 120 08/07/2019 1507   HDL 63 08/07/2019 1507   CHOLHDL 3.2 08/07/2019 1507   VLDL 37.2 01/06/2013 1157   LDLCALC 116 (H) 08/07/2019 1507   LDLDIRECT 148.1 01/06/2013 1157  Wt Readings from Last 3 Encounters:  11/29/20 132 lb (59.9 kg)  06/30/20 132 lb 1.6 oz (59.9 kg)  05/26/20 134 lb 5 oz (60.9 kg)       No flowsheet data found.    ASSESSMENT AND PLAN:  1.  Symptomatic PVCs: She is doing very well on current dose of Toprol 100 mg twice daily with no significant palpitations.  No PVCs noted by physical exam or on EKG.  2. History of sinus tachycardia: Resolved with metoprolol.  3. Essential hypertension:  Blood pressure is reasonably controlled on current dose of metoprolol.  4.  Bilateral foot pain: Nonspecific.  Does not seem to be due to peripheral arterial disease as she has normal pulses.    Disposition:   FU with me in 12 months  Signed,  Kathlyn Sacramento, MD  11/29/2020 4:23 PM    [place]

## 2020-11-29 NOTE — Patient Instructions (Signed)
Medication Instructions:  Your physician recommends that you continue on your current medications as directed. Please refer to the Current Medication list given to you today.  *If you need a refill on your cardiac medications before your next appointment, please call your pharmacy*   Lab Work: None Ordered If you have labs (blood work) drawn today and your tests are completely normal, you will receive your results only by: Marland Kitchen MyChart Message (if you have MyChart) OR . A paper copy in the mail If you have any lab test that is abnormal or we need to change your treatment, we will call you to review the results.   Testing/Procedures: None Ordered   Follow-Up: At [facility], you and your health needs are our priority.  As part of our continuing mission to provide you with exceptional heart care, we have created designated Provider Care Teams.  These Care Teams include your primary Cardiologist (physician) and Advanced Practice Providers (APPs -  Physician Assistants and Nurse Practitioners) who all work together to provide you with the care you need, when you need it.  We recommend signing up for the patient portal called "MyChart".  Sign up information is provided on this After Visit Summary.  MyChart is used to connect with patients for Virtual Visits (Telemedicine).  Patients are able to view lab/test results, encounter notes, upcoming appointments, etc.  Non-urgent messages can be sent to your provider as well.   To learn more about what you can do with MyChart, go to [email or web address].    Your next appointment:   1 year(s)  The format for your next appointment:   In Person  Provider:   You may see Kathlyn Sacramento, MD or one of the following Advanced Practice Providers on your designated Care Team:    Murray Hodgkins, NP  Christell Faith, PA-C  Marrianne Mood, PA-C  Cadence Wenden, [place]  Laurann Montana, NP    Other Instructions

## 2021-01-15 ENCOUNTER — Other Ambulatory Visit: Payer: Self-pay | Admitting: Family Medicine

## 2021-02-14 ENCOUNTER — Telehealth: Payer: Self-pay | Admitting: Family Medicine

## 2021-02-14 NOTE — Telephone Encounter (Signed)
LOV - 11/25/20 Next OV - 03/10/21 Last refilled - 10/24/20 #75/0

## 2021-02-14 NOTE — Telephone Encounter (Signed)
Patient needs the following Prescriptions filled: Hydrocodone  Pharmacy: [facility]

## 2021-02-15 MED ORDER — HYDROCODONE-ACETAMINOPHEN 7.5-325 MG PO TABS
1.0000 | ORAL_TABLET | Freq: Two times a day (BID) | ORAL | 0 refills | Status: DC | PRN
Start: 2021-02-15 — End: 2021-03-10

## 2021-02-15 MED ORDER — HYDROCODONE-ACETAMINOPHEN 7.5-325 MG PO TABS
1.0000 | ORAL_TABLET | Freq: Two times a day (BID) | ORAL | 0 refills | Status: DC | PRN
Start: 2021-02-15 — End: 2021-05-29

## 2021-02-15 NOTE — Addendum Note (Signed)
Addended by: Tonia Ghent on: 02/15/2021 03:25 PM   Modules accepted: Orders

## 2021-02-15 NOTE — Telephone Encounter (Signed)
Sent. Thanks.   

## 2021-02-18 ENCOUNTER — Other Ambulatory Visit: Payer: Self-pay | Admitting: Family Medicine

## 2021-02-18 DIAGNOSIS — M810 Age-related osteoporosis without current pathological fracture: Secondary | ICD-10-CM

## 2021-02-18 DIAGNOSIS — I10 Essential (primary) hypertension: Secondary | ICD-10-CM

## 2021-03-03 ENCOUNTER — Other Ambulatory Visit (INDEPENDENT_AMBULATORY_CARE_PROVIDER_SITE_OTHER): Payer: Medicare HMO

## 2021-03-03 ENCOUNTER — Other Ambulatory Visit: Payer: Self-pay

## 2021-03-03 DIAGNOSIS — M810 Age-related osteoporosis without current pathological fracture: Secondary | ICD-10-CM | POA: Diagnosis not present

## 2021-03-03 DIAGNOSIS — I10 Essential (primary) hypertension: Secondary | ICD-10-CM

## 2021-03-03 LAB — BASIC METABOLIC PANEL
BUN: 24 mg/dL — ABNORMAL HIGH (ref 6–23)
CO2: 34 mEq/L — ABNORMAL HIGH (ref 19–32)
Calcium: 9.9 mg/dL (ref 8.4–10.5)
Chloride: 100 mEq/L (ref 96–112)
Creatinine, Ser: 1.1 mg/dL (ref 0.40–1.20)
GFR: 46.22 mL/min — ABNORMAL LOW (ref >60.00)
Glucose, Bld: 78 mg/dL (ref 70–99)
Potassium: 4.5 mEq/L (ref 3.5–5.1)
Sodium: 141 mEq/L (ref 135–145)

## 2021-03-03 LAB — LIPID PANEL
Cholesterol: 204 mg/dL — ABNORMAL HIGH (ref 0–200)
HDL: 67.5 mg/dL (ref >39.00)
LDL Cholesterol: 114 mg/dL — ABNORMAL HIGH (ref 0–99)
NonHDL: 136.72
Total CHOL/HDL Ratio: 3
Triglycerides: 114 mg/dL (ref 0.0–149.0)
VLDL: 22.8 mg/dL (ref 0.0–40.0)

## 2021-03-03 LAB — VITAMIN D 25 HYDROXY (VIT D DEFICIENCY, FRACTURES): VITD: 81.15 ng/mL (ref 30.00–100.00)

## 2021-03-10 ENCOUNTER — Encounter: Payer: Self-pay | Admitting: Family Medicine

## 2021-03-10 ENCOUNTER — Ambulatory Visit (INDEPENDENT_AMBULATORY_CARE_PROVIDER_SITE_OTHER): Payer: Medicare HMO | Admitting: Family Medicine

## 2021-03-10 ENCOUNTER — Other Ambulatory Visit: Payer: Self-pay

## 2021-03-10 VITALS — BP 112/80 | HR 92 | Temp 98.1°F | Ht <= 58 in | Wt 130.0 lb

## 2021-03-10 DIAGNOSIS — L989 Disorder of the skin and subcutaneous tissue, unspecified: Secondary | ICD-10-CM | POA: Diagnosis not present

## 2021-03-10 DIAGNOSIS — B0229 Other postherpetic nervous system involvement: Secondary | ICD-10-CM | POA: Diagnosis not present

## 2021-03-10 DIAGNOSIS — I493 Ventricular premature depolarization: Secondary | ICD-10-CM

## 2021-03-10 DIAGNOSIS — J4530 Mild persistent asthma, uncomplicated: Secondary | ICD-10-CM | POA: Diagnosis not present

## 2021-03-10 DIAGNOSIS — F411 Generalized anxiety disorder: Secondary | ICD-10-CM

## 2021-03-10 DIAGNOSIS — R609 Edema, unspecified: Secondary | ICD-10-CM

## 2021-03-10 DIAGNOSIS — Z7189 Other specified counseling: Secondary | ICD-10-CM

## 2021-03-10 DIAGNOSIS — M5430 Sciatica, unspecified side: Secondary | ICD-10-CM

## 2021-03-10 DIAGNOSIS — Z0000 Encounter for general adult medical examination without abnormal findings: Secondary | ICD-10-CM

## 2021-03-10 MED ORDER — LIDOCAINE 5 % EX PTCH: 1.0000 | MEDICATED_PATCH | CUTANEOUS | 5 refills | Status: DC

## 2021-03-10 MED ORDER — MUPIROCIN CALCIUM 2 % EX CREA: 1.0000 "application " | TOPICAL_CREAM | Freq: Two times a day (BID) | CUTANEOUS | Status: DC

## 2021-03-10 NOTE — Patient Instructions (Signed)
Try using a little antibiotics cream on the spot and update me as needed.  I'll work on getting home health PT set up.  Take care.  Glad to see you.

## 2021-03-10 NOTE — Progress Notes (Signed)
This visit occurred during the SARS-CoV-2 public health emergency.  Safety protocols were in place, including screening questions prior to the visit, additional usage of staff PPE, and extensive cleaning of exam room while observing appropriate contact time as indicated for disinfecting solutions.  Rare lasix use.  Some swelling noted by the late PM.  Better by the next AM.  This is at baseline.  Chronic pain from shingles.  Some days or nights are worse than others.  "It's like somebody pored scalding water on my back."  Pain meds help some but still with pain.   We talked about retrial of lidoderm patch.  Rx sent.  Asthma.  Still on inhalers at baseline.  She can get SOB with exertion, unclear if from deconditioning.  Discussed gradual exercise increase  She is still doing chores at baseline, laundry and dishes, etc.    L sided sciatica pain worse recently, more pain with more walking.  No R leg sx.  She has paresthesia on the L leg.  No heart racing on metoprolol.  Compliant with medication use.  Skin lesion.  Sore locally, on the left buttocks.  Noted recently.  No drainage.    Mood d/w pt.  Still using ativan prn.  No ADE on med.  It helps.  Vaccines d/w pt. Mammogram 2021 Colonoscopy not due given age.   DXA from 2019 d/w pt.   Labs d/w pt.  Living will d/w pt. Daughter [name] designated if patient were incapacitated.    Meds, vitals, and allergies reviewed.   ROS: Per HPI unless specifically indicated in ROS section   GEN: nad, alert and oriented HEENT: ncat NECK: supple w/o LA CV: rrr.  PULM: ctab, no inc wob ABD: soft, +bs EXT: no edema SKIN: no acute rash Chaperoned exam with a small nonulcerated papular lesion on the left buttock, to the left of the rectum.  Does not appear to communicate with the rectum.  No drainage.  No spreading erythema.  35 minutes were devoted to patient care in this encounter (this includes time spent reviewing the patient's file/history,  interviewing and examining the patient, counseling/reviewing plan with patient).

## 2021-03-12 DIAGNOSIS — M5430 Sciatica, unspecified side: Secondary | ICD-10-CM | POA: Insufficient documentation

## 2021-03-12 DIAGNOSIS — Z0000 Encounter for general adult medical examination without abnormal findings: Secondary | ICD-10-CM | POA: Insufficient documentation

## 2021-03-12 NOTE — Assessment & Plan Note (Signed)
Living will d/w pt. Daughter [name] designated if patient were incapacitated.

## 2021-03-12 NOTE — Assessment & Plan Note (Signed)
Rare Lasix use.  She will update me if worse in the meantime.

## 2021-03-12 NOTE — Assessment & Plan Note (Signed)
Continue as needed Ativan.  She will update me as needed.

## 2021-03-12 NOTE — Assessment & Plan Note (Signed)
Continue baseline inhalers and refer to home health PT as that may help with endurance/deconditioning.

## 2021-03-12 NOTE — Assessment & Plan Note (Signed)
It looks like she has an irritated lesion a few millimeters across that did not communicate with the rectum and it appears to be a superficial problem.  She has used Bactroban in the past without complication.  Reasonable to use that topically in the meantime and she will update me if the lesion does not improve.  It is so small and would not need incision and drainage at this point.

## 2021-03-12 NOTE — Assessment & Plan Note (Signed)
History of.  Would continue metoprolol.

## 2021-03-12 NOTE — Assessment & Plan Note (Signed)
Continue hydrocodone and retry Lidoderm patch and update me as needed.  She agrees.

## 2021-03-12 NOTE — Assessment & Plan Note (Signed)
Vaccines d/w pt. Mammogram 2021 Colonoscopy not due given age.   DXA from 2019 d/w pt.   Labs d/w pt.  Living will d/w pt. Daughter [name] designated if patient were incapacitated.

## 2021-03-12 NOTE — Assessment & Plan Note (Signed)
Refer to home health PT.  My hope is that she can get help with both this and overall deconditioning.

## 2021-04-03 DIAGNOSIS — B0223 Postherpetic polyneuropathy: Secondary | ICD-10-CM | POA: Diagnosis not present

## 2021-04-03 DIAGNOSIS — J4530 Mild persistent asthma, uncomplicated: Secondary | ICD-10-CM | POA: Diagnosis not present

## 2021-04-03 DIAGNOSIS — R609 Edema, unspecified: Secondary | ICD-10-CM | POA: Diagnosis not present

## 2021-04-03 DIAGNOSIS — I493 Ventricular premature depolarization: Secondary | ICD-10-CM | POA: Diagnosis not present

## 2021-04-03 DIAGNOSIS — Z79899 Other long term (current) drug therapy: Secondary | ICD-10-CM | POA: Diagnosis not present

## 2021-04-03 DIAGNOSIS — M5432 Sciatica, left side: Secondary | ICD-10-CM | POA: Diagnosis not present

## 2021-04-03 DIAGNOSIS — Z9181 History of falling: Secondary | ICD-10-CM | POA: Diagnosis not present

## 2021-04-03 DIAGNOSIS — F419 Anxiety disorder, unspecified: Secondary | ICD-10-CM | POA: Diagnosis not present

## 2021-04-03 DIAGNOSIS — G8929 Other chronic pain: Secondary | ICD-10-CM | POA: Diagnosis not present

## 2021-04-08 DIAGNOSIS — Z9181 History of falling: Secondary | ICD-10-CM | POA: Diagnosis not present

## 2021-04-08 DIAGNOSIS — G8929 Other chronic pain: Secondary | ICD-10-CM | POA: Diagnosis not present

## 2021-04-08 DIAGNOSIS — F419 Anxiety disorder, unspecified: Secondary | ICD-10-CM | POA: Diagnosis not present

## 2021-04-08 DIAGNOSIS — R609 Edema, unspecified: Secondary | ICD-10-CM | POA: Diagnosis not present

## 2021-04-08 DIAGNOSIS — J4530 Mild persistent asthma, uncomplicated: Secondary | ICD-10-CM | POA: Diagnosis not present

## 2021-04-08 DIAGNOSIS — B0223 Postherpetic polyneuropathy: Secondary | ICD-10-CM | POA: Diagnosis not present

## 2021-04-08 DIAGNOSIS — M5432 Sciatica, left side: Secondary | ICD-10-CM | POA: Diagnosis not present

## 2021-04-08 DIAGNOSIS — Z79899 Other long term (current) drug therapy: Secondary | ICD-10-CM | POA: Diagnosis not present

## 2021-04-08 DIAGNOSIS — I493 Ventricular premature depolarization: Secondary | ICD-10-CM | POA: Diagnosis not present

## 2021-04-11 DIAGNOSIS — G8929 Other chronic pain: Secondary | ICD-10-CM | POA: Diagnosis not present

## 2021-04-11 DIAGNOSIS — I493 Ventricular premature depolarization: Secondary | ICD-10-CM | POA: Diagnosis not present

## 2021-04-11 DIAGNOSIS — J4530 Mild persistent asthma, uncomplicated: Secondary | ICD-10-CM | POA: Diagnosis not present

## 2021-04-11 DIAGNOSIS — Z79899 Other long term (current) drug therapy: Secondary | ICD-10-CM | POA: Diagnosis not present

## 2021-04-11 DIAGNOSIS — B0223 Postherpetic polyneuropathy: Secondary | ICD-10-CM | POA: Diagnosis not present

## 2021-04-11 DIAGNOSIS — Z9181 History of falling: Secondary | ICD-10-CM | POA: Diagnosis not present

## 2021-04-11 DIAGNOSIS — M5432 Sciatica, left side: Secondary | ICD-10-CM | POA: Diagnosis not present

## 2021-04-11 DIAGNOSIS — R609 Edema, unspecified: Secondary | ICD-10-CM | POA: Diagnosis not present

## 2021-04-11 DIAGNOSIS — F419 Anxiety disorder, unspecified: Secondary | ICD-10-CM | POA: Diagnosis not present

## 2021-04-13 DIAGNOSIS — Z79899 Other long term (current) drug therapy: Secondary | ICD-10-CM | POA: Diagnosis not present

## 2021-04-13 DIAGNOSIS — G8929 Other chronic pain: Secondary | ICD-10-CM | POA: Diagnosis not present

## 2021-04-13 DIAGNOSIS — B0223 Postherpetic polyneuropathy: Secondary | ICD-10-CM | POA: Diagnosis not present

## 2021-04-13 DIAGNOSIS — M5432 Sciatica, left side: Secondary | ICD-10-CM | POA: Diagnosis not present

## 2021-04-13 DIAGNOSIS — J4530 Mild persistent asthma, uncomplicated: Secondary | ICD-10-CM | POA: Diagnosis not present

## 2021-04-13 DIAGNOSIS — F419 Anxiety disorder, unspecified: Secondary | ICD-10-CM | POA: Diagnosis not present

## 2021-04-13 DIAGNOSIS — Z9181 History of falling: Secondary | ICD-10-CM | POA: Diagnosis not present

## 2021-04-13 DIAGNOSIS — I493 Ventricular premature depolarization: Secondary | ICD-10-CM | POA: Diagnosis not present

## 2021-04-13 DIAGNOSIS — R609 Edema, unspecified: Secondary | ICD-10-CM | POA: Diagnosis not present

## 2021-04-19 ENCOUNTER — Telehealth: Payer: Self-pay

## 2021-04-19 DIAGNOSIS — G8929 Other chronic pain: Secondary | ICD-10-CM | POA: Diagnosis not present

## 2021-04-19 DIAGNOSIS — M5430 Sciatica, unspecified side: Secondary | ICD-10-CM

## 2021-04-19 DIAGNOSIS — F419 Anxiety disorder, unspecified: Secondary | ICD-10-CM | POA: Diagnosis not present

## 2021-04-19 DIAGNOSIS — Z79899 Other long term (current) drug therapy: Secondary | ICD-10-CM | POA: Diagnosis not present

## 2021-04-19 DIAGNOSIS — R609 Edema, unspecified: Secondary | ICD-10-CM | POA: Diagnosis not present

## 2021-04-19 DIAGNOSIS — I493 Ventricular premature depolarization: Secondary | ICD-10-CM | POA: Diagnosis not present

## 2021-04-19 DIAGNOSIS — M5432 Sciatica, left side: Secondary | ICD-10-CM | POA: Diagnosis not present

## 2021-04-19 DIAGNOSIS — B0223 Postherpetic polyneuropathy: Secondary | ICD-10-CM | POA: Diagnosis not present

## 2021-04-19 DIAGNOSIS — Z9181 History of falling: Secondary | ICD-10-CM | POA: Diagnosis not present

## 2021-04-19 DIAGNOSIS — J4530 Mild persistent asthma, uncomplicated: Secondary | ICD-10-CM | POA: Diagnosis not present

## 2021-04-19 NOTE — Telephone Encounter (Signed)
Please send order for rollator.  Thanks.  Dx M54.30 (ICD-10-CM) - Sciatica, unspecified laterality

## 2021-04-19 NOTE — Telephone Encounter (Signed)
Lamuel, PT with [facility] has been working with patient and recommends a Engineer, civil (consulting) for patient to help with ambulation. Requesting that prescription be sent to [facility]. Their phone number is [phone] and fax is [phone]. Please advise.

## 2021-04-20 NOTE — Telephone Encounter (Signed)
DME order done and faxed to [facility]

## 2021-04-20 NOTE — Addendum Note (Signed)
Addended by: Sherrilee Gilles B on: 04/20/2021 04:32 PM   Modules accepted: Orders

## 2021-04-21 DIAGNOSIS — M5430 Sciatica, unspecified side: Secondary | ICD-10-CM | POA: Diagnosis not present

## 2021-04-29 DIAGNOSIS — I493 Ventricular premature depolarization: Secondary | ICD-10-CM | POA: Diagnosis not present

## 2021-04-29 DIAGNOSIS — J4530 Mild persistent asthma, uncomplicated: Secondary | ICD-10-CM | POA: Diagnosis not present

## 2021-04-29 DIAGNOSIS — B0223 Postherpetic polyneuropathy: Secondary | ICD-10-CM | POA: Diagnosis not present

## 2021-04-29 DIAGNOSIS — F419 Anxiety disorder, unspecified: Secondary | ICD-10-CM | POA: Diagnosis not present

## 2021-04-29 DIAGNOSIS — Z9181 History of falling: Secondary | ICD-10-CM | POA: Diagnosis not present

## 2021-04-29 DIAGNOSIS — Z79899 Other long term (current) drug therapy: Secondary | ICD-10-CM | POA: Diagnosis not present

## 2021-04-29 DIAGNOSIS — G8929 Other chronic pain: Secondary | ICD-10-CM | POA: Diagnosis not present

## 2021-04-29 DIAGNOSIS — M5432 Sciatica, left side: Secondary | ICD-10-CM | POA: Diagnosis not present

## 2021-04-29 DIAGNOSIS — R609 Edema, unspecified: Secondary | ICD-10-CM | POA: Diagnosis not present

## 2021-05-18 ENCOUNTER — Telehealth: Payer: Self-pay | Admitting: Family Medicine

## 2021-05-18 NOTE — Telephone Encounter (Signed)
Last refilled on 11/21/20 #90/0 and NOV is 05/29/21

## 2021-05-18 NOTE — Telephone Encounter (Signed)
  LAST APPOINTMENT DATE: 04/19/2021   NEXT APPOINTMENT DATE:@Visit  date not found  MEDICATION: lorazepam  PHARMACY: [facility]- [address]   Let patient know to contact pharmacy at the end of the day to make sure medication is ready.  Please notify patient to allow 48-72 hours to process  Encourage patient to contact the pharmacy for refills or they can request refills through [place]:   LAST REFILL:  QTY:  REFILL DATE:    OTHER COMMENTS:    Okay for refill?  Please advise

## 2021-05-19 MED ORDER — LORAZEPAM 1 MG PO TABS
ORAL_TABLET | ORAL | 0 refills | Status: DC
Start: 2021-05-19 — End: 2021-12-12

## 2021-05-19 NOTE — Addendum Note (Signed)
Addended by: Tonia Ghent on: 05/19/2021 07:49 AM   Modules accepted: Orders

## 2021-05-19 NOTE — Telephone Encounter (Signed)
Sent. Thanks.   

## 2021-05-29 ENCOUNTER — Ambulatory Visit (INDEPENDENT_AMBULATORY_CARE_PROVIDER_SITE_OTHER): Payer: Medicare HMO | Admitting: Family Medicine

## 2021-05-29 ENCOUNTER — Other Ambulatory Visit: Payer: Self-pay

## 2021-05-29 ENCOUNTER — Encounter: Payer: Self-pay | Admitting: Family Medicine

## 2021-05-29 DIAGNOSIS — R21 Rash and other nonspecific skin eruption: Secondary | ICD-10-CM | POA: Diagnosis not present

## 2021-05-29 DIAGNOSIS — B0229 Other postherpetic nervous system involvement: Secondary | ICD-10-CM | POA: Diagnosis not present

## 2021-05-29 DIAGNOSIS — M5430 Sciatica, unspecified side: Secondary | ICD-10-CM

## 2021-05-29 DIAGNOSIS — J0140 Acute pansinusitis, unspecified: Secondary | ICD-10-CM | POA: Diagnosis not present

## 2021-05-29 MED ORDER — FLOVENT DISKUS 250 MCG/BLIST IN AEPB
INHALATION_SPRAY | RESPIRATORY_TRACT | 12 refills | Status: DC
Start: 2021-05-29 — End: 2023-07-29

## 2021-05-29 MED ORDER — HYDROCODONE-ACETAMINOPHEN 7.5-325 MG PO TABS
1.0000 | ORAL_TABLET | Freq: Two times a day (BID) | ORAL | 0 refills | Status: DC | PRN
Start: 2021-05-29 — End: 2021-09-26

## 2021-05-29 MED ORDER — CLOBETASOL PROPIONATE 0.05 % EX CREA: 1.0000 "application " | TOPICAL_CREAM | CUTANEOUS | 1 refills | Status: DC | PRN

## 2021-05-29 MED ORDER — ALBUTEROL SULFATE HFA 108 (90 BASE) MCG/ACT IN AERS
1.0000 | INHALATION_SPRAY | Freq: Four times a day (QID) | RESPIRATORY_TRACT | 0 refills | Status: DC | PRN
Start: 2021-05-29 — End: 2023-09-03

## 2021-05-29 MED ORDER — FLUTICASONE PROPIONATE 50 MCG/ACT NA SUSP: 2.0000 | Freq: Every day | NASAL | 12 refills | Status: DC

## 2021-05-29 NOTE — Patient Instructions (Addendum)
Use the cream every other day and see if that helps.  Use it only on the rash.   Restart the PT exercises and keep using your walker.   Drink more water.   Take care.  Glad to see you. Plan on recheck in about 3-4 months.  Update me as needed in the meantime.

## 2021-05-29 NOTE — Progress Notes (Signed)
This visit occurred during the SARS-CoV-2 public health emergency.  Safety protocols were in place, including screening questions prior to the visit, additional usage of staff PPE, and extensive cleaning of exam room while observing appropriate contact time as indicated for disinfecting solutions.  She still has paresthesia down the L leg "when it feels like water running down my left leg."  Noted in the last few weeks.  History of sciatica noted.  Still on hydrocodone at baseline for shingles pain.  Not sedated.  No adverse effect on medication.  No recent lasix use.  D/w pt about inc in fluid intake.    She had lesion noted on the L buttocks prev.  No blood in stool.  Lesion isn't painful all the time.  No change in size per patient report.  No discharge or drainage.  If worse, then she'll update me.    Rash in the groin.  Prev benign path on biopsy.  Burning with external irritation.  Going on for years.  D/w pt about prev gyn eval. she did have a refill on clobetasol.  Prescription sent.  Meds, vitals, and allergies reviewed.   ROS: Per HPI unless specifically indicated in ROS section   GEN: nad, alert and oriented HEENT: ncat NECK: supple w/o LA CV: rrr. PULM: ctab, no inc wob ABD: soft, +bs EXT: no edema SKIN: Well-perfused.  33 minutes were devoted to patient care in this encounter (this includes time spent reviewing the patient's file/history, interviewing and examining the patient, counseling/reviewing plan with patient).

## 2021-05-31 DIAGNOSIS — R21 Rash and other nonspecific skin eruption: Secondary | ICD-10-CM | POA: Insufficient documentation

## 2021-05-31 NOTE — Assessment & Plan Note (Signed)
Continue hydrocodone at baseline with routine cautions.  Not sedated.  Still okay for outpatient follow-up.  She agrees with plan.  Update me as needed.

## 2021-05-31 NOTE — Assessment & Plan Note (Signed)
Discussed options.  I asked her to restart the PT exercises and keep using her walker.  She will update me as needed.  No weakness.

## 2021-05-31 NOTE — Assessment & Plan Note (Signed)
She can try clobetasol and then update me as needed.  Steroid cream cautions discussed with patient.

## 2021-06-24 ENCOUNTER — Other Ambulatory Visit: Payer: Self-pay | Admitting: Family Medicine

## 2021-08-11 ENCOUNTER — Other Ambulatory Visit: Payer: Self-pay | Admitting: Family Medicine

## 2021-08-23 DIAGNOSIS — Z20822 Contact with and (suspected) exposure to covid-19: Secondary | ICD-10-CM | POA: Diagnosis not present

## 2021-08-23 DIAGNOSIS — U071 COVID-19: Secondary | ICD-10-CM | POA: Diagnosis not present

## 2021-08-24 ENCOUNTER — Other Ambulatory Visit: Payer: Self-pay

## 2021-08-24 ENCOUNTER — Telehealth (INDEPENDENT_AMBULATORY_CARE_PROVIDER_SITE_OTHER): Payer: Medicare HMO | Admitting: Family Medicine

## 2021-08-24 ENCOUNTER — Telehealth: Payer: Self-pay

## 2021-08-24 VITALS — Temp 98.5°F

## 2021-08-24 DIAGNOSIS — U071 COVID-19: Secondary | ICD-10-CM

## 2021-08-24 MED ORDER — MOLNUPIRAVIR EUA 200MG CAPSULE: 4.0000 | ORAL_CAPSULE | Freq: Two times a day (BID) | ORAL | 0 refills | Status: AC

## 2021-08-24 NOTE — Telephone Encounter (Signed)
I spoke with Jillian Oconnell (DPR signed) pt tested + covid on 08/23/21; pt started with symptoms of sinus pressure and coughing. Pt is still asleep this morning. Self quarantine, drink plenty of fluids, rest, and take Tylenol for fever. UC & ED precautions given and Jillian Oconnell voiced understanding. No available appts at [facility] today. Jillian Oconnell scheduled video visit with Dr Janett Billow Copland 08/24/21 at 11:40. Will have vital signs ready. Sending note to Dr Janett Billow Copland and Dr Damita Dunnings as PCP an Jul[facility]ier.

## 2021-08-24 NOTE — Telephone Encounter (Signed)
[facility] Night - Client TELEPHONE ADVICE RECORD [facility] Patient Name: Jillian Oconnell D Gender: Female DOB: July 08, 1934 Age: 87 Y 27 M 26 D Return Phone Number: [phone] (Primary), [phone] (Secondary) Address: City/ State/ Zip: [place] [address] Client [facility] Night - Client Client Site [facility] Physician Renford Dills - MD Contact Type Call Who Is Calling Patient / Member / Family / Caregiver Call Type Triage / Clinical Caller Name Anola Gurney Relationship To Patient Daughter Return Phone Number [phone] (Secondary) Chief Complaint Cough Reason for Call Symptomatic / Request for Health Information Initial Comment caller states her mom has been diagnosed with covid, her symptoms are coughing and congestion Translation No Nurse Assessment Nurse: Mancel Bale, RN, Traci Date/Time (Eastern Time): 08/23/2021 6:17:39 PM Confirm and document reason for call. If symptomatic, describe symptoms. ---Caller states her mom has been diagnosed with covid at a clinic today, her symptoms are coughing, headache and congestion. Symptoms began yesterday. Denies temp. Denies any trouble breathing. Does the patient have any new or worsening symptoms? ---Yes Will a triage be completed? ---Yes Related visit to physician within the last 2 weeks? ---No Does the PT have any chronic conditions? (i.e. diabetes, asthma, this includes High risk factors for pregnancy, etc.) ---Yes List chronic conditions. ---asthma, htn, heart arrhythmia Is this a behavioral health or substance abuse call? ---No Guidelines Guideline Title Affirmed Question Affirmed Notes Nurse Date/Time (Eastern Time) COVID-19 - Diagnosed or Suspected [1] HIGH RISK for severe COVID complications (e.g., weak immune system, age > 3 years, obesity with BMI 30 or higher, pregnant, chronic lung disease or other [place] 08/23/2021 6:19:26 PM PLEASE NOTE: All timestamps contained within this report are represented as [place] Standard Time. CONFIDENTIALTY NOTICE: This fax transmission is intended only for the addressee. It contains information that is legally privileged, confidential or otherwise protected from use or disclosure. If you are not the intended recipient, you are strictly prohibited from reviewing, disclosing, copying using or disseminating any of this information or taking any action in reliance on or regarding this information. If you have received this fax in error, please notify us immediately by telephone so that we can arrange for its return to [place]. Phone: [phone], Toll-Free: [phone], Fax: [phone] Page: 2 of 2 Call Id: [id number] Guidelines Guideline Title Affirmed Question Affirmed Notes Nurse Date/Time Eilene Ghazi Time) chronic medical condition) AND [2] COVID symptoms (e.g., cough, fever) (Exceptions: Already seen by PCP and no new or worsening symptoms.) Disp. Time Eilene Ghazi Time) Disposition Final User 08/23/2021 6:22:46 PM Call PCP within 24 Hours Yes Mancel Bale, RN, Tressia Miners Caller Disagree/Comply Comply Caller Understands Yes PreDisposition [place] Advice Given Per Guideline CALL PCP WITHIN 24 HOURS: * IF OFFICE WILL BE OPEN: Call the office when it opens tomorrow morning. * You need to discuss this with your doctor (or NP/PA) within the next 24 hours. * Telemedicine may be your best choice for care during this COVID-19 outbreak. ALTERNATE DISPOSITION - TELEMEDICINE WITHIN 24 HOURS: * You should call a telemedicine doctor (or NP/PA) within the next 24 hours, if your own doctor is not available. * COUGH DROPS: Over-the-counter cough drops can help a lot, especially for mild coughs. They soothe an irritated throat and remove the tickle sensation in the back of the throat. Cough drops are easy to carry with you. * COUGH SYRUP WITH DEXTROMETHORPHAN: An  over-the-counter cough syrup can help your cough. The most common cough suppressant in  over-the-counter cough medicines is dextromethorphan. * HOME REMEDY - HONEY: This old home remedy has been shown to help decrease coughing at night. The adult dosage is 2 teaspoons (10 ml) at bedtime. FEVER MEDICINES: * For fevers above 101 F (38.3 C) take either acetaminophen or ibuprofen. CALL BACK IF: * You become worse CARE ADVICE given per COVID-19 - DIAGNOSED OR SUSPECTED (Adult) guideline. * Feeling dehydrated: Drink extra liquids. If the air in your home is dry, use a humidifier. Referrals REFERRED TO PCP OFFICE

## 2021-08-24 NOTE — Telephone Encounter (Signed)
I thank all involved. 

## 2021-08-24 NOTE — Progress Notes (Signed)
[facility] at [facility] [address], [place], [place] [address] [phone]  Date:  08/24/2021   Name:  Jillian Oconnell   DOB:  29-Mar-1934   MRN:  [id number]  PCP:  Tonia Ghent, MD    Chief Complaint: Covid Positive (Tested positive 9/7, congestion ,drainage, productive cough, no fever/)   History of Present Illness:  Jillian Oconnell is a 87 y.o. very pleasant female patient who presents with the following:  Virtual visit today for this primary patient of Dr. Damita Dunnings.  Patient location is home, my location office.  Patient is confirmed with 2 factors, she gives consent for virtual visit today The patient and myself are present on the call today, her daughter also help facilitate  Patient has been vaccinated and boosted against COVID-19 She tested positive for COVID-19 yesterday and is concerned about potential worsening She has noted sx of sinus congestion, mild cough, no fever.  She is feeling tired out but this is not new   History of tachycardia, hypertension, chronic pain from previous shingles infection   Patient Active Problem List   Diagnosis Date Noted   Rash 05/31/2021   Sciatica 03/12/2021   Healthcare maintenance 03/12/2021   Paresthesia 11/27/2020   Edema 05/29/2020   Hyperkalemia 08/13/2019   Medicare annual wellness visit, subsequent 08/03/2018   Hemorrhoids 07/02/2018   Chronic use of opiate for therapeutic purpose 01/26/2018   History of UTI 09/20/2017   Advance care planning 04/28/2017   Hearing loss 03/24/2017   Headache 01/24/2017   Fall at home 10/28/2016   Skin lesion 06/29/2016   Lower back pain 03/28/2016   Symptomatic PVCs 02/06/2016   Impingement syndrome of right shoulder 07/20/2015   Breast cancer screening 04/28/2015   Paroxysmal supraventricular tachycardia (HCC)    Pain in joint, shoulder region 11/09/2013   Exertional chest pain 01/06/2013   Back pain 10/12/2012   Dysuria 08/10/2011    CARPAL TUNNEL SYNDROME 01/09/2011   Osteoporosis 09/06/2010   Depression 03/16/2009   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   HLD (hyperlipidemia) 02/23/2009   Anxiety state 02/23/2009   HTN (hypertension) 02/23/2009   Allergic rhinitis 02/23/2009   Asthma 02/23/2009   GERD 02/23/2009    Past Medical History:  Diagnosis Date   Actinic keratosis    Allergy    Anxiety    Asthma    Concussion    with fall at home 10/2016   Depression    GERD (gastroesophageal reflux disease)    Glaucoma    Hyperlipidemia    Hypertension    IBS (irritable bowel syndrome)    OA (osteoarthritis) of knee    injections   Osteopenia    DXA 2011   Palpitations    Paroxysmal supraventricular tachycardia ([facility]) May of 2005   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 Minor luminal irregularities with no evidence of obstructive disease.   cataract surgery     [facility]  2004   TOTAL ABDOMINAL HYSTERECTOMY      Social History  Tobacco Use   Smoking status: Never   Smokeless tobacco: Never  Vaping Use   Vaping Use: Never used  Substance Use Topics   Alcohol use: No    Alcohol/week: 0.0 standard drinks   Drug use: No    Family History  Problem Relation Age of Onset   Hypertension Mother    Breast cancer Mother 87   Cancer Sister        colon   Colon cancer Sister    Hypertension Father    Aneurysm Father        AAA   Cancer Daughter        thyroid   Anxiety disorder Daughter    Breast cancer Daughter 8   Breast cancer Daughter     Allergies  Allergen Reactions   Albuterol     REACTION: jittery   Alendronate Sodium     REACTION: GI side eff    Amoxicillin-Pot Clavulanate     REACTION: non tolerant- but can take plain amox   Atorvastatin    Azithromycin     GI upset.  Not an allergy.   Ezetimibe-Simvastatin     REACTION: myalgia   Gabapentin     REACTION: tremor   Ibandronate Sodium     REACTION: GI side eff   Lexapro [Escitalopram Oxalate]     sedation   Lyrica [Pregabalin] Other (See Comments)    heart racing and didn't help pain.    Neosporin [Bacitracin-Polymyxin B]     Irritation locally   Risedronate Sodium     REACTION: GI side eff   Rosuvastatin     REACTION: myalgia   Sertraline     sedation   Sulfonamide Derivatives     REACTION: not tolerate    Medication list has been reviewed and updated.  Current Outpatient Medications on File Prior to Visit  Medication Sig Dispense Refill   albuterol (VENTOLIN HFA) 108 (90 Base) MCG/ACT inhaler Inhale 1-2 puffs into the lungs every 6 (six) hours as needed for wheezing or shortness of breath. 1 each 0   Calcium Carbonate-Vitamin D 600-400 MG-UNIT tablet Take 1 tablet by mouth two times a day     clobetasol cream (TEMOVATE) [id number] % Apply 1 application topically every other day as needed. 30 g 1   fluticasone (FLONASE) 50 MCG/ACT nasal spray Place 2 sprays into both nostrils daily. 16 g 12   Fluticasone Propionate, Inhal, (FLOVENT DISKUS) 250 MCG/BLIST AEPB INHALE 2 PUFFS IN THE MORNING AND INHALE 2 PUFFS IN THE EVENING (RINSE AFTER USE) 60 each 12   furosemide (LASIX) 20 MG tablet Take 1 tablet (20 mg total) by mouth daily as needed for fluid. 30 tablet 1   HYDROcodone-acetaminophen (NORCO) 7.5-325 MG tablet Take 1 tablet by mouth 2 (two) times daily as needed (for pain. with extra half tab if needed.). 75 tablet 0   HYDROcodone-acetaminophen (NORCO) 7.5-325 MG tablet Take 1 tablet by mouth 2 (two) times daily as needed (for pain. with extra half tab if needed. fill on/after 07/29/21). 75 tablet 0   HYDROcodone-acetaminophen (NORCO) 7.5-325 MG tablet Take 1 tablet by  mouth 2 (two) times daily as needed (for pain. with extra half tab if needed.  fill on/after 06/29/21). 75 tablet 0   lansoprazole (PREVACID) 15 MG capsule Take 1 capsule by mouth once daily 90 capsule 1   levocetirizine (XYZAL) 5 MG tablet Take 1 tablet (5 mg total) by mouth every evening. 30 tablet 0   lidocaine (LIDODERM) 5 %  Place 1 patch onto the skin daily. Remove & Discard patch within 12 hours or as directed by MD.  Patient has h/o Shingles. 30 patch 5   LORazepam (ATIVAN) 1 MG tablet TAKE 1/2 (ONE-HALF) TABLET BY MOUTH TWICE DAILY AS NEEDED (SEDATION CAUTION) 90 tablet 0   metoprolol succinate (TOPROL-XL) 100 MG 24 hr tablet TAKE 1 TABLET TWICE DAILY WITH OR IMMEDIATELY FOLLOWING A MEAL 180 tablet 0   montelukast (SINGULAIR) 10 MG tablet TAKE 1 TABLET BY MOUTH AT BEDTIME. 90 tablet 0   No current facility-administered medications on file prior to visit.    Review of Systems:  As per HPI- otherwise negative.  No HA  No SOB No chest pain  Physical Examination: Vitals:   08/24/21 1125  Temp: 98.5 F (36.9 C)   There were no vitals filed for this visit. There is no height or weight on file to calculate BMI. Ideal Body Weight:    Pt observed via mychart video  She looks well, in no distress.  No shortness of breath is observed. Patient notes she checked her blood pressure this morning and it was elevated.  However, she had not yet taken her blood pressure medication.  I asked her to take her routine blood pressure medicine and then recheck her pressure in 2 to 3 hours.  If still elevated she will alert [place] Assessment and Plan: COVID-19 - Plan: molnupiravir EUA 200 mg CAPS  Virtual visit today for concern of COVID-19.  Elderly woman who is fully vaccinated.  She is interested in using oral antivirals to further decrease her risk of serious illness.  I prescribed molnupiravir and instructed her in how to use it She will monitor for any worsening of her symptoms, if any distress or  significant shortness of breath she will seek immediate care  Signed Lamar Blinks, MD

## 2021-09-19 DIAGNOSIS — H35371 Puckering of macula, right eye: Secondary | ICD-10-CM | POA: Diagnosis not present

## 2021-09-19 DIAGNOSIS — Z0100 Encounter for examination of eyes and vision without abnormal findings: Secondary | ICD-10-CM | POA: Diagnosis not present

## 2021-09-25 ENCOUNTER — Telehealth: Payer: Self-pay | Admitting: Family Medicine

## 2021-09-25 NOTE — Telephone Encounter (Signed)
  Encourage patient to contact the pharmacy for refills or they can request refills through [facility]:  Please schedule appointment if longer than 1 year  NEXT APPOINTMENT DATE:no future appt  MEDICATION:HYDROcodone-acetaminophen ([place]) 7.5-325 MG tablet  Is the patient out of medication? no  [place], [place]  Let patient know to contact pharmacy at the end of the day to make sure medication is ready.  Please notify patient to allow 48-72 hours to process  CLINICAL FILLS OUT ALL BELOW:   LAST REFILL:  QTY:  REFILL DATE:    OTHER COMMENTS:    Okay for refill?  Please advise

## 2021-09-25 NOTE — Telephone Encounter (Signed)
LOV - 05/29/21 NOV - not scheduled Last refilled - 05/29/21 #75/0 x 3

## 2021-09-26 MED ORDER — HYDROCODONE-ACETAMINOPHEN 7.5-325 MG PO TABS: 1.0000 | ORAL_TABLET | Freq: Two times a day (BID) | ORAL | 0 refills | Status: DC | PRN

## 2021-09-26 NOTE — Addendum Note (Signed)
Addended by: Tonia Ghent on: 09/26/2021 09:09 AM   Modules accepted: Orders

## 2021-09-26 NOTE — Telephone Encounter (Signed)
Sent. Thanks.   

## 2021-10-23 ENCOUNTER — Encounter: Payer: Self-pay | Admitting: Family Medicine

## 2021-10-23 ENCOUNTER — Telehealth (INDEPENDENT_AMBULATORY_CARE_PROVIDER_SITE_OTHER): Payer: Medicare HMO | Admitting: Family Medicine

## 2021-10-23 ENCOUNTER — Other Ambulatory Visit: Payer: Self-pay

## 2021-10-23 VITALS — Ht <= 58 in | Wt 128.0 lb

## 2021-10-23 DIAGNOSIS — J0100 Acute maxillary sinusitis, unspecified: Secondary | ICD-10-CM | POA: Diagnosis not present

## 2021-10-23 DIAGNOSIS — M5430 Sciatica, unspecified side: Secondary | ICD-10-CM

## 2021-10-23 MED ORDER — AMOXICILLIN 875 MG PO TABS: 875.0000 mg | ORAL_TABLET | Freq: Two times a day (BID) | ORAL | 0 refills | Status: DC

## 2021-10-23 NOTE — Progress Notes (Signed)
Virtual visit completed through WebEx or similar program Patient location: home  Provider location: [facility], office  Participants: Patient and me (unless stated otherwise below)  Pandemic considerations d/w pt.   Limitations and rationale for visit method d/w patient.  Patient agreed to proceed.   CC: URI sx  HPI:  She has been tapering her lorazepam.  She is tolerating that.  Down to 1/4 tab a day.  She is sleeping well.  Still on pain medicine at baseline.  Not drowsy.    Facial pressure going on for about 2 weeks.  She has a sig amount of mucous production. Maxillary and frontal pain.  No fevers.  No wheeze.  Is clearing her throat.   Yellow mucous.    She had flu shot.  She had covid a few months ago.  She had been vaccinated, hx updated.    She has lower back pain and L leg pain, radiating down the L leg.  Going on for "awhile" and worse some days than others.  D/w pt about using her walker.  No falls.  She had done PT in the past.  D/w pt about getting HHPT set up, she doesn't drive.    Meds and allergies reviewed.   ROS: Per HPI unless specifically indicated in ROS section   NAD Speech wnl  A/P: Presumed sinusitis.  Start amoxil.  Supportive care otherwise.  Still okay for outpatient follow-up.  She will update me as needed.  Sciatica.  Restart home health.  Ordered.  Unable to leave home, needs HHPT.  She is already on her baseline pain medication and she will continue that in the meantime, for history of postherpetic neuralgia.

## 2021-10-25 NOTE — Assessment & Plan Note (Signed)
Sciatica.  Restart home health.  Ordered.  Unable to leave home, needs HHPT.  She is already on her baseline pain medication and she will continue that in the meantime, for history of postherpetic neuralgia.

## 2021-10-25 NOTE — Assessment & Plan Note (Signed)
  Presumed sinusitis.  Start amoxil.  Supportive care otherwise.  Still okay for outpatient follow-up.  She will update me as needed.

## 2021-11-03 ENCOUNTER — Telehealth: Payer: Self-pay | Admitting: Family Medicine

## 2021-11-03 NOTE — Telephone Encounter (Signed)
Home Health verbal orders [place] Agency Name: [facility] number: [phone]  Requesting OT/PT/Skilled nursing/Social Work/Speech:  Reason:OT  Frequency:1 wk for 6 wks  Please forward to [facility] pool or providers CMA

## 2021-11-03 NOTE — Telephone Encounter (Signed)
Left message on secure VM with verbal orders.

## 2021-11-03 NOTE — Telephone Encounter (Signed)
Please give the order.  Thanks.   

## 2021-11-20 ENCOUNTER — Other Ambulatory Visit: Payer: Self-pay | Admitting: Family Medicine

## 2021-11-30 NOTE — Progress Notes (Signed)
Subjective:   Jillian Oconnell is a 87 y.o. female who presents for Medicare Annual (Subsequent) preventive examination.  I connected with Laretta Bolster today by telephone and verified that I am speaking with the correct person using two identifiers. Location patient: home Location provider: work Persons participating in the virtual visit: patient, Marine scientist.    I discussed the limitations, risks, security and privacy concerns of performing an evaluation and management service by telephone and the availability of in person appointments. I also discussed with the patient that there may be a patient responsible charge related to this service. The patient expressed understanding and verbally consented to this telephonic visit.    Interactive audio and video telecommunications were attempted between this provider and patient, however failed, due to patient having technical difficulties OR patient did not have access to video capability.  We continued and completed visit with audio only.  Some vital signs may be absent or patient reported.   Time Spent with patient on telephone encounter: 25 minutes  Review of Systems     Cardiac Risk Factors include: advanced age (>64men, >19 women);hypertension;dyslipidemia     Objective:    Today's Vitals   12/04/21 1315  Weight: 130 lb (59 kg)  Height: 4\' 10"  (1.473 m)  PainSc: 5    Body mass index is 27.17 kg/m.  Advanced Directives 12/04/2021 07/30/2018 04/23/2017  Does Patient Have a Medical Advance Directive? No No Yes  Type of Advance Directive - - [place] in Chart? - - No - copy requested  Would patient like information on creating a medical advance directive? Yes (MAU/Ambulatory/Procedural Areas - Information given) Yes (MAU/Ambulatory/Procedural Areas - Information given) -    Current Medications (verified) Outpatient Encounter Medications as of 12/04/2021  Medication Sig   albuterol  (VENTOLIN HFA) 108 (90 Base) MCG/ACT inhaler Inhale 1-2 puffs into the lungs every 6 (six) hours as needed for wheezing or shortness of breath.   Calcium Carbonate-Vitamin D 600-400 MG-UNIT tablet Take 1 tablet by mouth two times a day   fluticasone (FLONASE) 50 MCG/ACT nasal spray Place 2 sprays into both nostrils daily.   Fluticasone Propionate, Inhal, (FLOVENT DISKUS) 250 MCG/BLIST AEPB INHALE 2 PUFFS IN THE MORNING AND INHALE 2 PUFFS IN THE EVENING (RINSE AFTER USE)   HYDROcodone-acetaminophen (NORCO) 7.5-325 MG tablet Take 1 tablet by mouth 2 (two) times daily as needed (for pain. with extra half tab if needed.).   lansoprazole (PREVACID) 15 MG capsule Take 1 capsule by mouth once daily   LORazepam (ATIVAN) 1 MG tablet TAKE 1/2 (ONE-HALF) TABLET BY MOUTH TWICE DAILY AS NEEDED (SEDATION CAUTION)   metoprolol succinate (TOPROL-XL) 100 MG 24 hr tablet TAKE 1 TABLET TWICE DAILY WITH OR IMMEDIATELY FOLLOWING A MEAL   montelukast (SINGULAIR) 10 MG tablet TAKE 1 TABLET AT BEDTIME   No facility-administered encounter medications on file as of 12/04/2021.    Allergies (verified) Albuterol, Alendronate sodium, Amoxicillin-pot clavulanate, Atorvastatin, Azithromycin, Ezetimibe-simvastatin, Gabapentin, Ibandronate sodium, Lexapro [escitalopram oxalate], Lyrica [pregabalin], Neosporin [bacitracin-polymyxin b], Risedronate sodium, Rosuvastatin, Sertraline, and Sulfonamide derivatives   History: Past Medical History:  Diagnosis Date   Actinic keratosis    Allergy    Anxiety    Asthma    Concussion    with fall at home 10/2016   Depression    GERD (gastroesophageal reflux disease)    Glaucoma    Hyperlipidemia    Hypertension    IBS (irritable bowel syndrome)  OA (osteoarthritis) of knee    injections   Osteopenia    DXA 03-29-2010   Palpitations    Paroxysmal supraventricular tachycardia ([place]) May of 2005   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 Minor luminal irregularities with no evidence of obstructive disease.   cataract surgery     [place]  2003-03-30   TOTAL ABDOMINAL HYSTERECTOMY     Family History  Problem Relation Age of Onset   Hypertension Mother    Breast cancer Mother 66   Cancer Sister        colon   Colon cancer Sister    Hypertension Father    Aneurysm Father        AAA   Cancer Daughter        thyroid   Anxiety disorder Daughter    Breast cancer Daughter 102   Breast cancer Daughter    Social History   Socioeconomic History   Marital status: Widowed    Spouse name: Not on file   Number of children: Not on file   Years of education: Not on file   Highest education level: Not on file  Occupational History   Occupation: Retired    Fish farm manager: retired    Comment: Homemaker  Tobacco Use   Smoking status: Never   Smokeless tobacco: Never  Scientific laboratory technician Use: Never used  Substance and Sexual Activity   Alcohol use: No    Alcohol/week: 0.0 standard drinks   Drug use: No   Sexual activity: Not Currently    Birth control/protection: Post-menopausal  Other Topics Concern   Not on file  Social History Narrative   Regular exercise- no    Moved here from [place]- to be closer to family    Husband died of lung CA Mar 29, 2013   Social Determinants of Health   Financial Resource Strain: Low Risk    Difficulty of Paying Living Expenses: Not hard at all  Food Insecurity: No Food Insecurity   Worried About Charity fundraiser in the Last Year: Never true   Arboriculturist in the Last Year: Never true  Transportation Needs: No Transportation Needs   Lack of Transportation (Medical): No   Lack of Transportation  (Non-Medical): No  Physical Activity: Inactive   Days of Exercise per Week: 0 days   Minutes of Exercise per Session: 0 min  Stress: No Stress Concern Present   Feeling of Stress : Not at all  Social Connections: Socially Isolated   Frequency of Communication with Friends and Family: More than three times a week   Frequency of Social Gatherings with Friends and Family: More than three times a week   Attends Religious Services: Never   Marine scientist or Organizations: No   Attends Archivist Meetings: Never   Marital Status: Widowed    Tobacco Counseling Counseling given: Not Answered   Clinical Intake:  Pre-visit preparation completed: Yes  Pain : 0-10 Pain Score: 5  Pain Location: Back     BMI - recorded: 27.18 Nutritional Status:  BMI 25 -29 Overweight Nutritional Risks: None Diabetes: No  How often do you need to have someone help you when you read instructions, pamphlets, or other written materials from your doctor or pharmacy?: 1 - Never  Diabetic? No  Interpreter Needed?: No  Information entered by :: Orrin Brigham LPN   Activities of Daily Living In your present state of health, do you have any difficulty performing the following activities: 12/04/2021  Hearing? Y  Comment deifficulty hearing out of right ear  Vision? N  Difficulty concentrating or making decisions? N  Walking or climbing stairs? Y  Comment uses cane or walker  Dressing or bathing? N  Doing errands, shopping? Y  Comment daughter assists  Conservation officer, nature and eating ? N  Using the Toilet? N  In the past six months, have you accidently leaked urine? N  Do you have problems with loss of bowel control? N  Managing your Medications? N  Managing your Finances? N  Housekeeping or managing your Housekeeping? N  Some recent data might be hidden    Patient Care Team: Tonia Ghent, MD as PCP - General (Family Medicine) Wellington Hampshire, MD as PCP - Cardiology  (Cardiology)  Indicate any recent Medical Services you may have received from other than [facility] providers in the past year (date may be approximate).     Assessment:   This is a routine wellness examination for [place].  Hearing/Vision screen Hearing Screening - Comments:: Decrease hearing in right ear Vision Screening - Comments:: Last exam 2022, wears glasses, Dr. Wallace Going   Dietary issues and exercise activities discussed: Current Exercise Habits: The patient does not participate in regular exercise at present   Goals Addressed             This Visit's Progress    Patient Stated       Would like to drink more water       Depression Screen PHQ 2/9 Scores 12/04/2021 11/25/2020 08/10/2019 07/30/2018 04/23/2017 02/21/2016  PHQ - 2 Score 0 0 0 0 0 2  PHQ- 9 Score - - - 0 - -    Fall Risk Fall Risk  12/04/2021 11/25/2020 06/30/2020 08/10/2019 07/30/2018  Falls in the past year? 0 0 0 0 No  Comment - - - - -  Number falls in past yr: 0 0 0 - -  Injury with Fall? 0 0 0 - -  Follow up Falls prevention discussed Falls evaluation completed - - -    FALL RISK PREVENTION PERTAINING TO THE HOME:  Any stairs in or around the home? Yes  If so, are there any without handrails? No  Home free of loose throw rugs in walkways, pet beds, electrical cords, etc? Yes  Adequate lighting in your home to reduce risk of falls? Yes   ASSISTIVE DEVICES UTILIZED TO PREVENT FALLS:  Life alert? No  Use of a cane, walker or w/c? Yes , cane and walker as needed Grab bars in the bathroom? Yes  Shower chair or bench in shower? Yes  Elevated toilet seat or a handicapped toilet? Yes   TIMED UP AND GO:  Was the test performed? No , visit completed over the phone.     Cognitive Function: Normal cognitive status assessed by this Nurse Health Advisor. No abnormalities found.   MMSE - Mini Mental State Exam 07/30/2018 04/23/2017  Orientation to time 5 5  Orientation to Place 5 5  Registration 3 3   Attention/ Calculation 0 0  Recall  3 3  Language- name 2 objects 0 0  Language- repeat 1 1  Language- follow 3 step command 3 3  Language- read & follow direction 0 0  Write a sentence 0 0  Copy design 0 0  Total score 20 20        Immunizations Immunization History  Administered Date(s) Administered   Fluad Quad(high Dose 65+) 09/15/2019, 11/25/2020   Influenza Split 11/18/2012   Influenza Whole 10/03/2009, 09/29/2010   Influenza,inj,Quad PF,6+ Mos 09/18/2013, 10/08/2014, 10/22/2016, 11/21/2017, 10/06/2018   Influenza-Unspecified 10/21/2015, 10/06/2021   PFIZER(Purple Top)SARS-COV-2 Vaccination 02/11/2020, 03/09/2020, 10/16/2020, 10/06/2021   Pneumococcal Conjugate-13 07/01/2015   Pneumococcal Polysaccharide-23 12/17/2004   Td 05/02/2012    TDAP status: Up to date  Flu Vaccine status: Up to date  Pneumococcal vaccine status: Up to date  Covid-19 vaccine status: Completed vaccines  Qualifies for Shingles Vaccine? Yes   Zostavax completed No   Shingrix Completed?: No.    Education has been provided regarding the importance of this vaccine. Patient has been advised to call insurance company to determine out of pocket expense if they have not yet received this vaccine. Advised may also receive vaccine at local pharmacy or Health Dept. Verbalized acceptance and understanding.  Screening Tests Health Maintenance  Topic Date Due   Zoster Vaccines- Shingrix (1 of 2) Never done   MAMMOGRAM  09/26/2021   COVID-19 Vaccine (5 - Booster for Pfizer series) 12/01/2021   TETANUS/TDAP  05/02/2022   Pneumonia Vaccine 89+ Years old  Completed   INFLUENZA VACCINE  Completed   DEXA SCAN  Completed   HPV VACCINES  Aged Out    Health Maintenance  Health Maintenance Due  Topic Date Due   Zoster Vaccines- Shingrix (1 of 2) Never done   MAMMOGRAM  09/26/2021   COVID-19 Vaccine (5 - Booster for Pfizer series) 12/01/2021    Colorectal cancer screening: No longer required.    Mammogram status: Ordered 11/1921. Pt provided with contact info and advised to call to schedule appt.   Bone Density status: Ordered 12/04/21. Pt provided with contact info and advised to call to schedule appt.  Lung Cancer Screening: (Low Dose CT Chest recommended if Age 19-80 years, 30 pack-year currently smoking OR have quit w/in 15years.) does not qualify.     Additional Screening:  Hepatitis C Screening: does not qualify  Vision Screening: Recommended annual ophthalmology exams for early detection of glaucoma and other disorders of the eye. Is the patient up to date with their annual eye exam?  Yes  Who is the provider or what is the name of the office in which the patient attends annual eye exams? Dr. Bronson Curb   Dental Screening: Recommended annual dental exams for proper oral hygiene  Community Resource Referral / Chronic Care Management: CRR required this visit?  No   CCM required this visit?  No      Plan:     I have personally reviewed and noted the following in the patients chart:   Medical and social history Use of alcohol, tobacco or illicit drugs  Current medications and supplements including opioid prescriptions.  Functional ability and status Nutritional status Physical activity Advanced directives List of other physicians Hospitalizations, surgeries, and ER visits in previous 12 months Vitals Screenings to include cognitive, depression, and falls Referrals and appointments  In addition, I have reviewed and discussed with patient certain preventive protocols, quality metrics, and best practice recommendations. A written personalized care plan for preventive services as well as general  preventive health recommendations were provided to patient.   Due to this being a telephonic visit, the after visit summary with patients personalized plan was offered to patient via mail or my-chart. Patient preferred to pick up at office at next  visit.     Loma Messing, LPN   [id number]   Nurse Health Advisor  Nurse Notes: none

## 2021-12-04 ENCOUNTER — Ambulatory Visit (INDEPENDENT_AMBULATORY_CARE_PROVIDER_SITE_OTHER): Payer: Medicare HMO

## 2021-12-04 ENCOUNTER — Other Ambulatory Visit: Payer: Self-pay | Admitting: Family Medicine

## 2021-12-04 VITALS — Ht <= 58 in | Wt 130.0 lb

## 2021-12-04 DIAGNOSIS — Z1231 Encounter for screening mammogram for malignant neoplasm of breast: Secondary | ICD-10-CM

## 2021-12-04 DIAGNOSIS — Z780 Asymptomatic menopausal state: Secondary | ICD-10-CM | POA: Diagnosis not present

## 2021-12-04 DIAGNOSIS — Z0000 Encounter for general adult medical examination without abnormal findings: Secondary | ICD-10-CM | POA: Diagnosis not present

## 2021-12-04 NOTE — Patient Instructions (Signed)
Jillian Oconnell , Thank you for taking time to complete your Medicare Wellness Visit. I appreciate your ongoing commitment to your health goals. Please review the following plan we discussed and let me know if I can assist you in the future.   Screening recommendations/referrals: Colonoscopy: no longer required  Mammogram: due, last completed 09/26/20, ordered today, someone will call to schedule an appointment Bone Density: due, ordered today, someone will call to schedule an appointment Recommended yearly ophthalmology/optometry visit for glaucoma screening and checkup Recommended yearly dental visit for hygiene and checkup  Vaccinations: Influenza vaccine: up to date  Pneumococcal vaccine: up to date  Tdap vaccine: up to date, completed 05/02/12, due 05/02/22 Shingles vaccine: Discuss with your local pharmacy   Covid-19:up to date  Advanced directives: information available at your next appointment  Conditions/risks identified: see problem list  Next appointment: Follow up in one year for your annual wellness visit    Preventive Care 32 Years and Older, Female Preventive care refers to lifestyle choices and visits with your health care provider that can promote health and wellness. What does preventive care include? A yearly physical exam. This is also called an annual well check. Dental exams once or twice a year. Routine eye exams. Ask your health care provider how often you should have your eyes checked. Personal lifestyle choices, including: Daily care of your teeth and gums. Regular physical activity. Eating a healthy diet. Avoiding tobacco and drug use. Limiting alcohol use. Practicing safe sex. Taking low-dose aspirin every day. Taking vitamin and mineral supplements as recommended by your health care provider. What happens during an annual well check? The services and screenings done by your health care provider during your annual well check will depend on your age, overall  health, lifestyle risk factors, and family history of disease. Counseling  Your health care provider may ask you questions about your: Alcohol use. Tobacco use. Drug use. Emotional well-being. Home and relationship well-being. Sexual activity. Eating habits. History of falls. Memory and ability to understand (cognition). Work and work Statistician. Reproductive health. Screening  You may have the following tests or measurements: Height, weight, and BMI. Blood pressure. Lipid and cholesterol levels. These may be checked every 5 years, or more frequently if you are over 4 years old. Skin check. Lung cancer screening. You may have this screening every year starting at age 33 if you have a 30-pack-year history of smoking and currently smoke or have quit within the past 15 years. Fecal occult blood test (FOBT) of the stool. You may have this test every year starting at age 52. Flexible sigmoidoscopy or colonoscopy. You may have a sigmoidoscopy every 5 years or a colonoscopy every 10 years starting at age 70. Hepatitis C blood test. Hepatitis B blood test. Sexually transmitted disease (STD) testing. Diabetes screening. This is done by checking your blood sugar (glucose) after you have not eaten for a while (fasting). You may have this done every 1-3 years. Bone density scan. This is done to screen for osteoporosis. You may have this done starting at age 61. Mammogram. This may be done every 1-2 years. Talk to your health care provider about how often you should have regular mammograms. Talk with your health care provider about your test results, treatment options, and if necessary, the need for more tests. Vaccines  Your health care provider may recommend certain vaccines, such as: Influenza vaccine. This is recommended every year. Tetanus, diphtheria, and acellular pertussis (Tdap, Td) vaccine. You may need a Td  booster every 10 years. Zoster vaccine. You may need this after age  103. Pneumococcal 13-valent conjugate (PCV13) vaccine. One dose is recommended after age 78. Pneumococcal polysaccharide (PPSV23) vaccine. One dose is recommended after age 31. Talk to your health care provider about which screenings and vaccines you need and how often you need them. This information is not intended to replace advice given to you by your health care provider. Make sure you discuss any questions you have with your health care provider. Document Released: 12/30/2015 Document Revised: 08/22/2016 Document Reviewed: 10/04/2015 Elsevier Interactive Patient Education  2017 [employer] Prevention in the Home Falls can cause injuries. They can happen to people of all ages. There are many things you can do to make your home safe and to help prevent falls. What can I do on the outside of my home? Regularly fix the edges of walkways and driveways and fix any cracks. Remove anything that might make you trip as you walk through a door, such as a raised step or threshold. Trim any bushes or trees on the path to your home. Use bright outdoor lighting. Clear any walking paths of anything that might make someone trip, such as rocks or tools. Regularly check to see if handrails are loose or broken. Make sure that both sides of any steps have handrails. Any raised decks and porches should have guardrails on the edges. Have any leaves, snow, or ice cleared regularly. Use sand or salt on walking paths during winter. Clean up any spills in your garage right away. This includes oil or grease spills. What can I do in the bathroom? Use night lights. Install grab bars by the toilet and in the tub and shower. Do not use towel bars as grab bars. Use non-skid mats or decals in the tub or shower. If you need to sit down in the shower, use a plastic, non-slip stool. Keep the floor dry. Clean up any water that spills on the floor as soon as it happens. Remove soap buildup in the tub or shower  regularly. Attach bath mats securely with double-sided non-slip rug tape. Do not have throw rugs and other things on the floor that can make you trip. What can I do in the bedroom? Use night lights. Make sure that you have a light by your bed that is easy to reach. Do not use any sheets or blankets that are too big for your bed. They should not hang down onto the floor. Have a firm chair that has side arms. You can use this for support while you get dressed. Do not have throw rugs and other things on the floor that can make you trip. What can I do in the kitchen? Clean up any spills right away. Avoid walking on wet floors. Keep items that you use a lot in easy-to-reach places. If you need to reach something above you, use a strong step stool that has a grab bar. Keep electrical cords out of the way. Do not use floor polish or wax that makes floors slippery. If you must use wax, use non-skid floor wax. Do not have throw rugs and other things on the floor that can make you trip. What can I do with my stairs? Do not leave any items on the stairs. Make sure that there are handrails on both sides of the stairs and use them. Fix handrails that are broken or loose. Make sure that handrails are as long as the stairways. Check any carpeting  to make sure that it is firmly attached to the stairs. Fix any carpet that is loose or worn. Avoid having throw rugs at the top or bottom of the stairs. If you do have throw rugs, attach them to the floor with carpet tape. Make sure that you have a light switch at the top of the stairs and the bottom of the stairs. If you do not have them, ask someone to add them for you. What else can I do to help prevent falls? Wear shoes that: Do not have high heels. Have rubber bottoms. Are comfortable and fit you well. Are closed at the toe. Do not wear sandals. If you use a stepladder: Make sure that it is fully opened. Do not climb a closed stepladder. Make sure that  both sides of the stepladder are locked into place. Ask someone to hold it for you, if possible. Clearly mark and make sure that you can see: Any grab bars or handrails. First and last steps. Where the edge of each step is. Use tools that help you move around (mobility aids) if they are needed. These include: Canes. Walkers. Scooters. Crutches. Turn on the lights when you go into a dark area. Replace any light bulbs as soon as they burn out. Set up your furniture so you have a clear path. Avoid moving your furniture around. If any of your floors are uneven, fix them. If there are any pets around you, be aware of where they are. Review your medicines with your doctor. Some medicines can make you feel dizzy. This can increase your chance of falling. Ask your doctor what other things that you can do to help prevent falls. This information is not intended to replace advice given to you by your health care provider. Make sure you discuss any questions you have with your health care provider. Document Released: 09/29/2009 Document Revised: 05/10/2016 Document Reviewed: 01/07/2015 Elsevier Interactive Patient Education  2017 [employer].

## 2021-12-04 NOTE — Telephone Encounter (Signed)
What is the name of the medication? HYDROcodone-acetaminophen (NORCO) 7.5-325 MG tablet [[id number]]   Have you contacted your pharmacy to request a refill? Pt is needing a refill on this script, she had to rescheduled her 12/06/21 to 12/15/21.  Which pharmacy would you like this sent to? [place], [place] [facility]  [address] [place] [address]  Phone:  [phone]  Fax:  [phone]   Patient notified that their request is being sent to the clinical staff for review and that they should receive a call once it is complete. If they do not receive a call within 72 hours they can check with their pharmacy or our office.

## 2021-12-05 ENCOUNTER — Other Ambulatory Visit: Payer: Self-pay

## 2021-12-05 NOTE — Telephone Encounter (Signed)
[facility] and verified they do have an rx on file for patient and will get it ready for her. I called patient and left her a message about this as well.

## 2021-12-05 NOTE — Telephone Encounter (Signed)
Please check with patient/pharmacy.  Did she check with pharmacy?  She should have rx on file there.  If she doesn't, then let me know.  Thanks.

## 2021-12-05 NOTE — Telephone Encounter (Signed)
Thanks

## 2021-12-06 ENCOUNTER — Ambulatory Visit: Payer: Medicare HMO

## 2021-12-11 ENCOUNTER — Other Ambulatory Visit: Payer: Self-pay | Admitting: Family Medicine

## 2021-12-11 NOTE — Telephone Encounter (Signed)
Refill request for LORazepam 1 MG Oral Tablet  LOV - 10/23/21 VV Next OV - 12/15/21 Last refill - 05/19/21 #90/0

## 2021-12-12 NOTE — Telephone Encounter (Signed)
Sent. Thanks.   

## 2021-12-14 ENCOUNTER — Telehealth: Payer: Self-pay | Admitting: Family Medicine

## 2021-12-14 NOTE — Telephone Encounter (Signed)
Tried to call [facility] but her voicemail is full so I was unable to leave orders.

## 2021-12-14 NOTE — Telephone Encounter (Signed)
Please give the order.  Thanks.   

## 2021-12-14 NOTE — Telephone Encounter (Signed)
Home Health verbal orders [place] [facility]: [facility] number: [phone]  Requesting OT/PT/Skilled nursing/Social Work/Speech:  Reason:PT.Marland Kitchenwants to add on a visit for this week because it was a missed visit last week for pt discharge    Frequency:1 wk for 1 wk  Please forward to [facility] pool or providers CMA

## 2021-12-15 ENCOUNTER — Encounter: Payer: Self-pay | Admitting: Family Medicine

## 2021-12-15 ENCOUNTER — Other Ambulatory Visit: Payer: Self-pay

## 2021-12-15 ENCOUNTER — Telehealth (INDEPENDENT_AMBULATORY_CARE_PROVIDER_SITE_OTHER): Payer: Medicare HMO | Admitting: Family Medicine

## 2021-12-15 VITALS — Ht <= 58 in | Wt 128.0 lb

## 2021-12-15 DIAGNOSIS — M810 Age-related osteoporosis without current pathological fracture: Secondary | ICD-10-CM | POA: Diagnosis not present

## 2021-12-15 DIAGNOSIS — M549 Dorsalgia, unspecified: Secondary | ICD-10-CM

## 2021-12-15 DIAGNOSIS — L659 Nonscarring hair loss, unspecified: Secondary | ICD-10-CM

## 2021-12-15 NOTE — Progress Notes (Signed)
Virtual visit completed through WebEx or similar program Patient location: home  Provider location:  at [facility], office  Participants: Patient and me (unless stated otherwise below)  Pandemic considerations d/w pt.   Limitations and rationale for visit method d/w patient.  Patient agreed to proceed.   CC: follow up.    HPI:  she is still having leg and back pain.  Sitting helps.  More pain in the L leg.  This is separate from prev shingles pain- that continues at baseline.  She is still on baseline pain meds.  Not sedated.  Worse pain when the medicine wears off.  Most bothered by leg pain at this point.   L lower back pain, pain radiates down to the L foot. No foot drop.  She had done PT prior but "I need to do better sticking with them" meaning the exercises.     She has noted diffuse hair loss, not patchy loss.  She has noted breaking hair follicles also.   Meds and allergies reviewed.   ROS: Per HPI unless specifically indicated in ROS section   NAD Speech wnl  A/P: Hair loss.  Discussed options. Needs lab visit set up at [facility] station.  Needs to be done after 3pm.  We will work on getting this set up.  Low back pain.  Reasonable to image her back with plain films.  We will work on getting this set up after 3 PM on a day that is feasible for her.  Continue hydrocodone in the meantime.  Not sedated.  Still okay for outpatient follow-up.

## 2021-12-15 NOTE — Telephone Encounter (Signed)
Verbal orders have been given  

## 2021-12-18 DIAGNOSIS — L659 Nonscarring hair loss, unspecified: Secondary | ICD-10-CM | POA: Insufficient documentation

## 2021-12-18 NOTE — Assessment & Plan Note (Signed)
Low back pain.  Reasonable to image her back with plain films.  We will work on getting this set up after 3 PM on a day that is feasible for her.  Continue hydrocodone in the meantime.  Not sedated.  Still okay for outpatient follow-up.

## 2021-12-18 NOTE — Assessment & Plan Note (Signed)
Hair loss.  Discussed options. Needs lab visit set up at [facility] station.  Needs to be done after 3pm.  We will work on getting this set up.

## 2021-12-25 ENCOUNTER — Telehealth: Payer: Self-pay | Admitting: Family Medicine

## 2021-12-25 NOTE — Telephone Encounter (Signed)
Pt daughter called checking on the status of pt blood work and x-ray. Please advise.

## 2021-12-25 NOTE — Telephone Encounter (Signed)
Spoke to pt daughter, appt scheduled for 12/28/21 at [facility]

## 2021-12-25 NOTE — Telephone Encounter (Signed)
Patient can make lab appt and xray appt whenever possible. Orders are in for both.

## 2021-12-28 ENCOUNTER — Other Ambulatory Visit: Payer: Self-pay

## 2021-12-28 ENCOUNTER — Ambulatory Visit (INDEPENDENT_AMBULATORY_CARE_PROVIDER_SITE_OTHER): Payer: Medicare HMO

## 2021-12-28 ENCOUNTER — Other Ambulatory Visit (INDEPENDENT_AMBULATORY_CARE_PROVIDER_SITE_OTHER): Payer: Medicare HMO

## 2021-12-28 DIAGNOSIS — M545 Low back pain, unspecified: Secondary | ICD-10-CM | POA: Diagnosis not present

## 2021-12-28 DIAGNOSIS — M549 Dorsalgia, unspecified: Secondary | ICD-10-CM | POA: Diagnosis not present

## 2021-12-28 DIAGNOSIS — L659 Nonscarring hair loss, unspecified: Secondary | ICD-10-CM

## 2021-12-28 DIAGNOSIS — M810 Age-related osteoporosis without current pathological fracture: Secondary | ICD-10-CM | POA: Diagnosis not present

## 2021-12-28 DIAGNOSIS — M8588 Other specified disorders of bone density and structure, other site: Secondary | ICD-10-CM | POA: Diagnosis not present

## 2021-12-29 LAB — TSH: TSH: 5.57 u[IU]/mL — ABNORMAL HIGH (ref 0.35–5.50)

## 2021-12-29 LAB — COMPREHENSIVE METABOLIC PANEL
ALT: 8 U/L (ref 0–35)
AST: 18 U/L (ref 0–37)
Albumin: 4.1 g/dL (ref 3.5–5.2)
Alkaline Phosphatase: 52 U/L (ref 39–117)
BUN: 20 mg/dL (ref 6–23)
CO2: 34 mEq/L — ABNORMAL HIGH (ref 19–32)
Calcium: 9.8 mg/dL (ref 8.4–10.5)
Chloride: 99 mEq/L (ref 96–112)
Creatinine, Ser: 1.22 mg/dL — ABNORMAL HIGH (ref 0.40–1.20)
GFR: 40.59 mL/min — ABNORMAL LOW (ref >60.00)
Glucose, Bld: 86 mg/dL (ref 70–99)
Potassium: 5.1 mEq/L (ref 3.5–5.1)
Sodium: 141 mEq/L (ref 135–145)
Total Bilirubin: 0.5 mg/dL (ref 0.2–1.2)
Total Protein: 7 g/dL (ref 6.0–8.3)

## 2021-12-29 LAB — CBC WITH DIFFERENTIAL/PLATELET
Basophils Absolute: 0.1 10*3/uL (ref 0.0–0.1)
Basophils Relative: 0.8 % (ref 0.0–3.0)
Eosinophils Absolute: 0.6 10*3/uL (ref 0.0–0.7)
Eosinophils Relative: 9.1 % — ABNORMAL HIGH (ref 0.0–5.0)
HCT: 42.1 % (ref 36.0–46.0)
Hemoglobin: 13.7 g/dL (ref 12.0–15.0)
Lymphocytes Relative: 27.9 % (ref 12.0–46.0)
Lymphs Abs: 1.7 10*3/uL (ref 0.7–4.0)
MCHC: 32.4 g/dL (ref 30.0–36.0)
MCV: 92.5 fl (ref 78.0–100.0)
Monocytes Absolute: 0.7 10*3/uL (ref 0.1–1.0)
Monocytes Relative: 11.6 % (ref 3.0–12.0)
Neutro Abs: 3.1 10*3/uL (ref 1.4–7.7)
Neutrophils Relative %: 50.6 % (ref 43.0–77.0)
Platelets: 242 10*3/uL (ref 150.0–400.0)
RBC: 4.56 Mil/uL (ref 3.87–5.11)
RDW: 13.2 % (ref 11.5–15.5)
WBC: 6.1 10*3/uL (ref 4.0–10.5)

## 2021-12-29 LAB — VITAMIN D 25 HYDROXY (VIT D DEFICIENCY, FRACTURES): VITD: 69.61 ng/mL (ref 30.00–100.00)

## 2022-01-01 ENCOUNTER — Other Ambulatory Visit: Payer: Self-pay | Admitting: Family Medicine

## 2022-01-01 DIAGNOSIS — E039 Hypothyroidism, unspecified: Secondary | ICD-10-CM

## 2022-01-01 MED ORDER — LEVOTHYROXINE SODIUM 25 MCG PO TABS: 25.0000 ug | ORAL_TABLET | Freq: Every day | ORAL | 3 refills | Status: DC

## 2022-01-03 ENCOUNTER — Other Ambulatory Visit: Payer: Self-pay | Admitting: Family Medicine

## 2022-01-03 DIAGNOSIS — M545 Low back pain, unspecified: Secondary | ICD-10-CM

## 2022-01-03 MED ORDER — AMOXICILLIN 875 MG PO TABS: 875.0000 mg | ORAL_TABLET | Freq: Two times a day (BID) | ORAL | 0 refills | Status: AC

## 2022-01-17 ENCOUNTER — Telehealth: Payer: Self-pay | Admitting: Family Medicine

## 2022-01-17 MED ORDER — HYDROCODONE-ACETAMINOPHEN 7.5-325 MG PO TABS: 1.0000 | ORAL_TABLET | Freq: Two times a day (BID) | ORAL | 0 refills | Status: DC | PRN

## 2022-01-17 NOTE — Telephone Encounter (Signed)
Sent. Thanks.   

## 2022-01-17 NOTE — Telephone Encounter (Signed)
LOV - 12/15/21 NOV - 01/29/22 RF - 09/26/21 #75/0

## 2022-01-17 NOTE — Addendum Note (Signed)
Addended by: Tonia Ghent on: 01/17/2022 12:59 PM   Modules accepted: Orders

## 2022-01-17 NOTE — Telephone Encounter (Signed)
Pt needs a refill on HYDROcodone-acetaminophen (NORCO) 7.5-325 MG tablet sent to [facility]

## 2022-01-29 ENCOUNTER — Encounter: Payer: Self-pay | Admitting: Family Medicine

## 2022-01-29 ENCOUNTER — Other Ambulatory Visit: Payer: Self-pay

## 2022-01-29 ENCOUNTER — Ambulatory Visit (INDEPENDENT_AMBULATORY_CARE_PROVIDER_SITE_OTHER): Payer: Medicare HMO | Admitting: Family Medicine

## 2022-01-29 DIAGNOSIS — M545 Low back pain, unspecified: Secondary | ICD-10-CM | POA: Diagnosis not present

## 2022-01-29 DIAGNOSIS — E039 Hypothyroidism, unspecified: Secondary | ICD-10-CM | POA: Diagnosis not present

## 2022-01-29 LAB — TSH: TSH: 2.61 u[IU]/mL (ref 0.35–5.50)

## 2022-01-29 MED ORDER — HYDROCODONE-ACETAMINOPHEN 7.5-325 MG PO TABS: 1.0000 | ORAL_TABLET | Freq: Three times a day (TID) | ORAL | 0 refills | Status: DC | PRN

## 2022-01-29 NOTE — Progress Notes (Signed)
This visit occurred during the SARS-CoV-2 public health emergency.  Safety protocols were in place, including screening questions prior to the visit, additional usage of staff PPE, and extensive cleaning of exam room while observing appropriate contact time as indicated for disinfecting solutions.  Her daughter is going be a foster parent for the patient's great grandkids.  Form done for patient at [place], discussed with patient.  See scanned forms.   Patient isn't going to be a foster parent but needs forms filled out.    We talked about hypothyroidism dx.  She thought hair loss improved with thyroid replacement. No neck mass noted by patient.    D/w pt about partial refill on pain meds from prev rx.   Continued back pain and L leg pain and paresthesia.   Prev L spine films with IMPRESSION: 1. No acute fracture or dislocation. 2. Osteopenia with severe degenerative changes and scoliosis. Prev imaging d/w pt.  She isn't drowsy with current med use.     She is using the cane at baseline.  Fall cautions discussed with patient.    Meds, vitals, and allergies reviewed.   ROS: Per HPI unless specifically indicated in ROS section   GEN: nad, alert and oriented HEENT: ncat.  NECK: supple w/o LA CV: rrr.  PULM: ctab, no inc wob ABD: soft, +bs EXT: no edema SKIN: well perfused.  She has altered sensation in L leg at baseline.

## 2022-01-29 NOTE — Patient Instructions (Addendum)
Go to the lab on the way out.   If you have mychart we'll likely use that to update you.    Take care.  Glad to see you. Try taking an extra half tab of pain medicine as needed.  Sedation caution.I'll await the notes from the spine clinic.

## 2022-02-03 NOTE — Assessment & Plan Note (Signed)
She has chronic back pain.  She has osteopenia with severe degenerative changes in her back and completely expect that to be contributing to her back pain.  She is using a cane at baseline.  I think it makes sense to continue her pain medication.  We talked about options.  If she is having uncontrolled pain she can take an extra half tablet of hydrocodone daily.  She is not sedated or having adverse effect from the medication.  It does help her pain some.  She is never pain-free but it makes it more manageable.  I filled out her papers regarding living in a home with foster children.  She does have medical conditions (as would be expected for any person who is 88 years old) but there is nothing about her medical condition that would prevent her from living at home with foster children and there is nothing about her medical condition that would prevent foster children living at home with her.

## 2022-02-03 NOTE — Assessment & Plan Note (Signed)
Recheck TSH pending.  She thought her hair loss improved with thyroid replacement.  See notes on TSH.  Continue levothyroxine 25 mcg a day for now.

## 2022-02-09 ENCOUNTER — Other Ambulatory Visit: Payer: Self-pay | Admitting: Family Medicine

## 2022-02-14 DIAGNOSIS — H524 Presbyopia: Secondary | ICD-10-CM | POA: Diagnosis not present

## 2022-02-23 DIAGNOSIS — M5416 Radiculopathy, lumbar region: Secondary | ICD-10-CM | POA: Diagnosis not present

## 2022-03-11 ENCOUNTER — Other Ambulatory Visit: Payer: Self-pay | Admitting: Family Medicine

## 2022-03-11 DIAGNOSIS — E785 Hyperlipidemia, unspecified: Secondary | ICD-10-CM

## 2022-03-19 ENCOUNTER — Other Ambulatory Visit (INDEPENDENT_AMBULATORY_CARE_PROVIDER_SITE_OTHER): Payer: Medicare HMO

## 2022-03-19 DIAGNOSIS — E039 Hypothyroidism, unspecified: Secondary | ICD-10-CM

## 2022-03-19 DIAGNOSIS — E785 Hyperlipidemia, unspecified: Secondary | ICD-10-CM | POA: Diagnosis not present

## 2022-03-20 LAB — LIPID PANEL
Cholesterol: 200 mg/dL (ref 0–200)
HDL: 61.8 mg/dL (ref >39.00)
LDL Cholesterol: 115 mg/dL — ABNORMAL HIGH (ref 0–99)
NonHDL: 138.45
Total CHOL/HDL Ratio: 3
Triglycerides: 115 mg/dL (ref 0.0–149.0)
VLDL: 23 mg/dL (ref 0.0–40.0)

## 2022-03-20 LAB — TSH: TSH: 2.77 u[IU]/mL (ref 0.35–5.50)

## 2022-04-10 ENCOUNTER — Ambulatory Visit: Payer: Medicare HMO | Admitting: Cardiovascular Disease

## 2022-04-10 ENCOUNTER — Encounter: Payer: Self-pay | Admitting: Cardiovascular Disease

## 2022-04-10 VITALS — BP 130/82 | HR 76 | Ht <= 58 in | Wt 128.0 lb

## 2022-04-10 DIAGNOSIS — I493 Ventricular premature depolarization: Secondary | ICD-10-CM | POA: Diagnosis not present

## 2022-04-10 DIAGNOSIS — I10 Essential (primary) hypertension: Secondary | ICD-10-CM

## 2022-04-10 NOTE — Patient Instructions (Signed)

## 2022-04-10 NOTE — Progress Notes (Signed)
?  ?Cardiology Office Note ? ? ?Date:  04/10/2022  ? ?ID:  Jillian Oconnell, DOB 1934/08/25, MRN [id number] ? ?PCP:  Tonia Ghent, MD  ?Cardiologist:   Kathlyn Sacramento, MD  ? ?Chief Complaint  ?Patient presents with  ? Other  ?  12 month follow up -- Meds reviewed verbally with patient.  ? ? ?  ?History of Present Illness: ?Jillian Oconnell is a 88 y.o. female who presents for a followup visit regarding sinus tachycardia and frequent PVCs.   ?She had previous cardiac catheterization in February of 2009 which showed minor luminal irregularities with no evidence of obstructive disease. Ejection fraction was normal. ?Echocardiogram in 09/2016 showed normal LV systolic function, No significant valvular abnormalities and normal pulmonary pressure.  ?  ?Most recent Holter monitor in October 2017 showed very frequent PVCs with a total of 34,000 beats representing 25% burden.  She had an attempted repeat echocardiogram in 2021 but was not diagnostic due to poor visualization. ? ?PVCs have been well controlled with Toprol. ? ?She has been doing well with no recent chest pain, shortness of breath or palpitations. ? ? ? ?Past Medical History:  ?Diagnosis Date  ? Actinic keratosis   ? Allergy   ? Anxiety   ? Asthma   ? Concussion   ? with fall at home 10/2016  ? Depression   ? GERD (gastroesophageal reflux disease)   ? Glaucoma   ? Hyperlipidemia   ? Hypertension   ? IBS (irritable bowel syndrome)   ? OA (osteoarthritis) of knee   ? injections  ? Osteopenia   ? DXA 2011  ? Palpitations   ? Paroxysmal supraventricular tachycardia [facility]) May of 2005  ? Positive TB test   ? 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 Shingles   ? post herpetic neuralgia (pain clinic in past)  ? Squamous cell carcinoma of skin 03/07/2015  ? right preauricular   ? ? ?Past Surgical History:  ?Procedure Laterality Date  ? BREAST EXCISIONAL BIOPSY Right 1988  ? NEG  ? CARDIAC CATHETERIZATION  01/2008  ? 65% ;[facility]. Minor luminal irregularities with no  evidence of obstructive disease.  ? cataract surgery    ? GALLBLADDER SURGERY  1997  ? RECTOCELE REPAIR  2004  ? TOTAL ABDOMINAL HYSTERECTOMY    ? ? ? ?Current Outpatient Medications  ?Medication Sig Dispense Refill  ? albuterol (VENTOLIN HFA) 108 (90 Base) MCG/ACT inhaler Inhale 1-2 puffs into the lungs every 6 (six) hours as needed for wheezing or shortness of breath. 1 each 0  ? Calcium Carbonate-Vitamin D 600-400 MG-UNIT tablet Take 1 tablet by mouth two times a day    ? fluticasone (FLONASE) 50 MCG/ACT nasal spray Place 2 sprays into both nostrils daily. 16 g 12  ? Fluticasone Propionate, Inhal, (FLOVENT DISKUS) 250 MCG/BLIST AEPB INHALE 2 PUFFS IN THE MORNING AND INHALE 2 PUFFS IN THE EVENING (RINSE AFTER USE) 60 each 12  ? HYDROcodone-acetaminophen (NORCO) 7.5-325 MG tablet Take 1 tablet by mouth 3 (three) times daily as needed (for pain. with extra half tab if needed.). 90 tablet 0  ? lansoprazole (PREVACID) 15 MG capsule Take 1 capsule by mouth once daily 90 capsule 3  ? levothyroxine (SYNTHROID) 25 MCG tablet Take 1 tablet (25 mcg total) by mouth daily. 90 tablet 3  ? LORazepam (ATIVAN) 1 MG tablet TAKE 1/2 TABLET BY MOUTH TWICE DAILY AS NEEDED (SEDATION CAUTION) 90 tablet 0  ?  metoprolol succinate (TOPROL-XL) 100 MG 24 hr tablet TAKE 1 TABLET TWICE DAILY WITH OR IMMEDIATELY FOLLOWING A MEAL 180 tablet 1  ? montelukast (SINGULAIR) 10 MG tablet TAKE 1 TABLET AT BEDTIME 90 tablet 1  ? ?No current facility-administered medications for this visit.  ? ? ?Allergies:   Albuterol, Alendronate sodium, Amoxicillin-pot clavulanate, Atorvastatin, Azithromycin, Ezetimibe-simvastatin, Gabapentin, Ibandronate sodium, Lexapro [escitalopram oxalate], Lyrica [pregabalin], Neosporin [bacitracin-polymyxin b], Risedronate sodium, Rosuvastatin, Sertraline, and Sulfonamide derivatives  ? ? ?Social History:  The patient  reports that she has never smoked. She has never used smokeless tobacco. She reports that she does not drink  alcohol and does not use drugs.  ? ?Family History:  [name] family history includes Aneurysm in her father; Anxiety disorder in her daughter; Breast cancer in her daughter; Breast cancer (age of onset: 60) in her daughter; Breast cancer (age of onset: 70) in her mother; Cancer in her daughter and sister; Colon cancer in her sister; Hypertension in her father and mother.  ? ? ?ROS:  Please see the history of present illness.   Otherwise, review of systems are positive for none.   All other systems are reviewed and negative.  ? ? ?PHYSICAL EXAM: ?VS:  BP 130/82 (BP Location: Right Arm, Patient Position: Sitting, Cuff Size: Normal)   Pulse 76   Ht '4\' 10"'$  (1.473 m)   Wt 128 lb (58.1 kg)   SpO2 96%   BMI 26.75 kg/m?  , BMI Body mass index is 26.75 kg/m?. ?GEN: Well nourished, well developed, in no acute distress  ?HEENT: normal  ?Neck: no JVD, carotid bruits, or masses ?Cardiac: RRR ; no murmurs, rubs, or gallops,no edema  ?Respiratory:  clear to auscultation bilaterally, normal work of breathing ?GI: soft, nontender, nondistended, + BS ?MS: no deformity or atrophy  ?Skin: warm and dry, no rash ?Neuro:  Strength and sensation are intact ?Psych: euthymic mood, full affect ?Vascular: Pedal pulses are palpable. ? ?EKG:  EKG is  ordered today. ?EKG showed normal sinus rhythm with no significant ST or T wave changes. ? ?Recent Labs: ?12/28/2021: ALT 8; BUN 20; Creatinine, Ser 1.22; Hemoglobin 13.7; Platelets 242.0; Potassium 5.1; Sodium 141 ?03/19/2022: TSH 2.77  ? ? ?Lipid Panel ?   ?Component Value Date/Time  ? CHOL 200 03/19/2022 1540  ? TRIG 115.0 03/19/2022 1540  ? HDL 61.80 03/19/2022 1540  ? CHOLHDL 3 03/19/2022 1540  ? VLDL 23.0 03/19/2022 1540  ? [place] 115 (H) 03/19/2022 1540  ? LDLCALC 116 (H) 08/07/2019 1507  ? LDLDIRECT 148.1 01/06/2013 1157  ? ?  ? ?Wt Readings from Last 3 Encounters:  ?04/10/22 128 lb (58.1 kg)  ?01/29/22 130 lb (59 kg)  ?12/15/21 128 lb (58.1 kg)  ?  ? ? ? ?   ? View : No data to  display.  ?  ?  ?  ? ? ? ? ?ASSESSMENT AND PLAN: ? ?1.  Symptomatic PVCs: She is doing very well on current dose of Toprol 100 mg twice daily with no significant palpitations.  No PVCs noted by physical exam or on EKG. ?  ?2. History of sinus tachycardia: Resolved with metoprolol. ?  ?3. Essential hypertension:  Blood pressure is well controlled on metoprolol. ? ? ? ? ? ?Disposition:   FU with me in 12 months ? ?Signed, ? ?Kathlyn Sacramento, MD  ?04/10/2022 3:34 PM    ?[place] ?

## 2022-04-12 ENCOUNTER — Ambulatory Visit (INDEPENDENT_AMBULATORY_CARE_PROVIDER_SITE_OTHER): Payer: Medicare HMO | Admitting: Family Medicine

## 2022-04-12 ENCOUNTER — Encounter: Payer: Self-pay | Admitting: Family Medicine

## 2022-04-12 DIAGNOSIS — J392 Other diseases of pharynx: Secondary | ICD-10-CM

## 2022-04-12 DIAGNOSIS — Z79891 Long term (current) use of opiate analgesic: Secondary | ICD-10-CM | POA: Diagnosis not present

## 2022-04-12 DIAGNOSIS — L659 Nonscarring hair loss, unspecified: Secondary | ICD-10-CM

## 2022-04-12 NOTE — Progress Notes (Signed)
Chronic pain.  Using hydrocodone at baseline.  No ADE on med.  Not sedated.  Still with chronic pain from shingles and back pain.  D/w pt about bowel regimen.  Rarely constipated.  She can stretch her rx depending on pain level.  She needs refills x3 for hydrocodone, sent to [address].   ? ?She has claustrophobia but couldn't get open MRI approved.  She'll update me as needed but wanted to defer for now.   ? ?She has noted hair loss. She wears it in a bun at baseline.  Recent TSH wnl.  D/w pt about getting enough protein in diet.  She has dermatology f/u pending and I asked her to get dermatology input.   ? ?DXA and mammogram d/w pt.  She'll consider, d/w pt.   ? ?Throat irritation.  She has large tonsils at baseline and saw a white spot occ on the tonsil.  Some trouble swallowing but not always, more with dry or sticky foods, or with pills.  No trouble with liquids.  Not sticking in the chest.  No FCNAVD.   ? ?Meds, vitals, and allergies reviewed.  ? ?ROS: Per HPI unless specifically indicated in ROS section  ? ?GEN: nad, alert and oriented ?HEENT: mucous membranes moist, OP wnl, no ulceration noted.  No white lesions. ?NECK: supple w/o LA ?CV: rrr.   ?PULM: ctab, no inc wob ?ABD: soft, +bs ?EXT: no edema ?SKIN: well perfused.  ? ?35 minutes were devoted to patient care in this encounter (this includes time spent reviewing the patient's file/history, interviewing and examining the patient, counseling/reviewing plan with patient).  ? ?

## 2022-04-12 NOTE — Patient Instructions (Signed)
I'll send in your refills.  ?Your labs are good.  ?Ask dermatology about your hair.  ?At meals, take a little more water as needed. Sit up straight and clear your mouth/take small bites.  Let me know if you want to go for the swallowing test.   ?Take care.  Glad to see you. ?

## 2022-04-15 DIAGNOSIS — J392 Other diseases of pharynx: Secondary | ICD-10-CM | POA: Insufficient documentation

## 2022-04-15 MED ORDER — HYDROCODONE-ACETAMINOPHEN 7.5-325 MG PO TABS: 1.0000 | ORAL_TABLET | Freq: Three times a day (TID) | ORAL | 0 refills | Status: DC | PRN

## 2022-04-15 NOTE — Assessment & Plan Note (Addendum)
Continue hydrocodone as needed with sedation caution.  She has claustrophobia but still could not get open MRI approved.  She will update me if she wants to go through/reapply for MRI. ?

## 2022-04-15 NOTE — Assessment & Plan Note (Signed)
TSH normal.  Discussed getting enough protein in diet and getting dermatology input. ?

## 2022-04-15 NOTE — Assessment & Plan Note (Signed)
She also has some occasional trouble swallowing but not consistently.  Routine cautions given.  Swallowing cautions given.  We can send her for swallow study if she wants to go through with that.  She can consider.  She was not enthused about doing that now.  See after visit summary regarding instructions.  I do not see any worrisome lesions (or any lesions at all) in her mouth.  Discussed. ?

## 2022-06-05 ENCOUNTER — Emergency Department: Payer: Medicare HMO

## 2022-06-05 ENCOUNTER — Other Ambulatory Visit: Payer: Self-pay

## 2022-06-05 ENCOUNTER — Inpatient Hospital Stay
Admission: EM | Admit: 2022-06-05 | Discharge: 2022-06-07 | DRG: 065 | Disposition: A | Discharge status: Home Health | Payer: Medicare HMO | Attending: Osteopathic Medicine | Admitting: Osteopathic Medicine

## 2022-06-05 DIAGNOSIS — F32A Depression, unspecified: Secondary | ICD-10-CM | POA: Diagnosis present

## 2022-06-05 DIAGNOSIS — Z66 Do not resuscitate: Secondary | ICD-10-CM | POA: Diagnosis present

## 2022-06-05 DIAGNOSIS — I471 Supraventricular tachycardia, unspecified: Secondary | ICD-10-CM | POA: Diagnosis present

## 2022-06-05 DIAGNOSIS — Z20822 Contact with and (suspected) exposure to covid-19: Secondary | ICD-10-CM | POA: Diagnosis present

## 2022-06-05 DIAGNOSIS — Z800 Family history of malignant neoplasm of digestive organs: Secondary | ICD-10-CM

## 2022-06-05 DIAGNOSIS — Z79899 Other long term (current) drug therapy: Secondary | ICD-10-CM

## 2022-06-05 DIAGNOSIS — K649 Unspecified hemorrhoids: Secondary | ICD-10-CM | POA: Diagnosis not present

## 2022-06-05 DIAGNOSIS — I6389 Other cerebral infarction: Principal | ICD-10-CM | POA: Diagnosis present

## 2022-06-05 DIAGNOSIS — R4781 Slurred speech: Secondary | ICD-10-CM | POA: Diagnosis present

## 2022-06-05 DIAGNOSIS — I639 Cerebral infarction, unspecified: Secondary | ICD-10-CM | POA: Diagnosis not present

## 2022-06-05 DIAGNOSIS — Z8673 Personal history of transient ischemic attack (TIA), and cerebral infarction without residual deficits: Secondary | ICD-10-CM | POA: Diagnosis present

## 2022-06-05 DIAGNOSIS — F419 Anxiety disorder, unspecified: Secondary | ICD-10-CM | POA: Diagnosis present

## 2022-06-05 DIAGNOSIS — R079 Chest pain, unspecified: Secondary | ICD-10-CM

## 2022-06-05 DIAGNOSIS — Z803 Family history of malignant neoplasm of breast: Secondary | ICD-10-CM | POA: Diagnosis not present

## 2022-06-05 DIAGNOSIS — K219 Gastro-esophageal reflux disease without esophagitis: Secondary | ICD-10-CM | POA: Diagnosis present

## 2022-06-05 DIAGNOSIS — I10 Essential (primary) hypertension: Secondary | ICD-10-CM | POA: Diagnosis present

## 2022-06-05 DIAGNOSIS — E039 Hypothyroidism, unspecified: Secondary | ICD-10-CM | POA: Diagnosis not present

## 2022-06-05 DIAGNOSIS — I160 Hypertensive urgency: Secondary | ICD-10-CM | POA: Diagnosis present

## 2022-06-05 DIAGNOSIS — E785 Hyperlipidemia, unspecified: Secondary | ICD-10-CM | POA: Diagnosis not present

## 2022-06-05 DIAGNOSIS — R451 Restlessness and agitation: Secondary | ICD-10-CM | POA: Diagnosis present

## 2022-06-05 DIAGNOSIS — R0602 Shortness of breath: Secondary | ICD-10-CM

## 2022-06-05 DIAGNOSIS — Z8249 Family history of ischemic heart disease and other diseases of the circulatory system: Secondary | ICD-10-CM | POA: Diagnosis not present

## 2022-06-05 DIAGNOSIS — N3000 Acute cystitis without hematuria: Secondary | ICD-10-CM

## 2022-06-05 DIAGNOSIS — R410 Disorientation, unspecified: Secondary | ICD-10-CM | POA: Diagnosis not present

## 2022-06-05 DIAGNOSIS — R4182 Altered mental status, unspecified: Principal | ICD-10-CM

## 2022-06-05 DIAGNOSIS — J45909 Unspecified asthma, uncomplicated: Secondary | ICD-10-CM | POA: Diagnosis not present

## 2022-06-05 DIAGNOSIS — Z7902 Long term (current) use of antithrombotics/antiplatelets: Secondary | ICD-10-CM | POA: Diagnosis not present

## 2022-06-05 DIAGNOSIS — K449 Diaphragmatic hernia without obstruction or gangrene: Secondary | ICD-10-CM | POA: Diagnosis not present

## 2022-06-05 DIAGNOSIS — G8929 Other chronic pain: Secondary | ICD-10-CM | POA: Diagnosis present

## 2022-06-05 DIAGNOSIS — G459 Transient cerebral ischemic attack, unspecified: Secondary | ICD-10-CM | POA: Diagnosis not present

## 2022-06-05 DIAGNOSIS — E119 Type 2 diabetes mellitus without complications: Secondary | ICD-10-CM | POA: Diagnosis present

## 2022-06-05 DIAGNOSIS — I700 Atherosclerosis of aorta: Secondary | ICD-10-CM | POA: Diagnosis not present

## 2022-06-05 DIAGNOSIS — N390 Urinary tract infection, site not specified: Secondary | ICD-10-CM

## 2022-06-05 DIAGNOSIS — J9811 Atelectasis: Secondary | ICD-10-CM | POA: Diagnosis not present

## 2022-06-05 DIAGNOSIS — Z7982 Long term (current) use of aspirin: Secondary | ICD-10-CM | POA: Diagnosis not present

## 2022-06-05 DIAGNOSIS — R778 Other specified abnormalities of plasma proteins: Secondary | ICD-10-CM

## 2022-06-05 DIAGNOSIS — R29700 NIHSS score 0: Secondary | ICD-10-CM | POA: Diagnosis present

## 2022-06-05 DIAGNOSIS — I2510 Atherosclerotic heart disease of native coronary artery without angina pectoris: Secondary | ICD-10-CM | POA: Diagnosis not present

## 2022-06-05 DIAGNOSIS — R930 Abnormal findings on diagnostic imaging of skull and head, not elsewhere classified: Secondary | ICD-10-CM

## 2022-06-05 DIAGNOSIS — R519 Headache, unspecified: Secondary | ICD-10-CM | POA: Diagnosis not present

## 2022-06-05 LAB — COMPREHENSIVE METABOLIC PANEL
ALT: 16 U/L (ref 0–44)
AST: 24 U/L (ref 15–41)
Albumin: 4.2 g/dL (ref 3.5–5.0)
Alkaline Phosphatase: 61 U/L (ref 38–126)
Anion gap: 8 (ref 5–15)
BUN: 20 mg/dL (ref 8–23)
CO2: 30 mmol/L (ref 22–32)
Calcium: 9.4 mg/dL (ref 8.9–10.3)
Chloride: 102 mmol/L (ref 98–111)
Creatinine, Ser: 1.07 mg/dL — ABNORMAL HIGH (ref 0.44–1.00)
GFR, Estimated: 51 mL/min — ABNORMAL LOW (ref >60)
Glucose, Bld: 121 mg/dL — ABNORMAL HIGH (ref 70–99)
Potassium: 3.9 mmol/L (ref 3.5–5.1)
Sodium: 140 mmol/L (ref 135–145)
Total Bilirubin: 0.4 mg/dL (ref 0.3–1.2)
Total Protein: 8.3 g/dL — ABNORMAL HIGH (ref 6.5–8.1)

## 2022-06-05 LAB — CBC WITH DIFFERENTIAL/PLATELET
Abs Immature Granulocytes: 0.02 K/uL (ref 0.00–0.07)
Basophils Absolute: 0.1 K/uL (ref 0.0–0.1)
Basophils Relative: 2 %
Eosinophils Absolute: 0.6 K/uL — ABNORMAL HIGH (ref 0.0–0.5)
Eosinophils Relative: 9 %
HCT: 47.1 % — ABNORMAL HIGH (ref 36.0–46.0)
Hemoglobin: 14.7 g/dL (ref 12.0–15.0)
Immature Granulocytes: 0 %
Lymphocytes Relative: 30 %
Lymphs Abs: 1.8 K/uL (ref 0.7–4.0)
MCH: 29.1 pg (ref 26.0–34.0)
MCHC: 31.2 g/dL (ref 30.0–36.0)
MCV: 93.3 fL (ref 80.0–100.0)
Monocytes Absolute: 0.6 K/uL (ref 0.1–1.0)
Monocytes Relative: 9 %
Neutro Abs: 3.1 K/uL (ref 1.7–7.7)
Neutrophils Relative %: 50 %
Platelets: 259 K/uL (ref 150–400)
RBC: 5.05 MIL/uL (ref 3.87–5.11)
RDW: 12.6 % (ref 11.5–15.5)
WBC: 6.2 K/uL (ref 4.0–10.5)
nRBC: 0 % (ref 0.0–0.2)

## 2022-06-05 LAB — MAGNESIUM: Magnesium: 1.9 mg/dL (ref 1.7–2.4)

## 2022-06-05 LAB — URINALYSIS, COMPLETE (UACMP) WITH MICROSCOPIC
Bilirubin Urine: NEGATIVE
Glucose, UA: NEGATIVE mg/dL
Ketones, ur: NEGATIVE mg/dL
Nitrite: NEGATIVE
Protein, ur: NEGATIVE mg/dL
Specific Gravity, Urine: 1.002 — ABNORMAL LOW (ref 1.005–1.030)
pH: 7 (ref 5.0–8.0)

## 2022-06-05 LAB — TROPONIN I (HIGH SENSITIVITY): Troponin I (High Sensitivity): 41 ng/L — ABNORMAL HIGH (ref <18)

## 2022-06-05 LAB — BRAIN NATRIURETIC PEPTIDE: B Natriuretic Peptide: 410.1 pg/mL — ABNORMAL HIGH (ref 0.0–100.0)

## 2022-06-05 MED ORDER — IPRATROPIUM-ALBUTEROL 0.5-2.5 (3) MG/3ML IN SOLN
3.0000 mL | Freq: Once | RESPIRATORY_TRACT | Status: AC
  Administered 2022-06-06: 3 mL via RESPIRATORY_TRACT
  Filled 2022-06-05: qty 3

## 2022-06-05 MED ORDER — SODIUM CHLORIDE 0.9 % IV SOLN
1.0000 g | Freq: Once | INTRAVENOUS | Status: AC
  Administered 2022-06-06: 1 g via INTRAVENOUS
  Filled 2022-06-05: qty 10

## 2022-06-05 MED ORDER — LORAZEPAM 2 MG/ML IJ SOLN
0.5000 mg | Freq: Once | INTRAMUSCULAR | Status: AC | PRN
  Administered 2022-06-05: 0.5 mg via INTRAVENOUS
  Filled 2022-06-05: qty 1

## 2022-06-05 MED ORDER — LABETALOL HCL 5 MG/ML IV SOLN
10.0000 mg | Freq: Once | INTRAVENOUS | Status: AC
Start: 2022-06-05 — End: 2022-06-05
  Administered 2022-06-05: 10 mg via INTRAVENOUS
  Filled 2022-06-05: qty 4

## 2022-06-05 MED ORDER — IOHEXOL 350 MG/ML SOLN
75.0000 mL | Freq: Once | INTRAVENOUS | Status: AC | PRN
Start: 2022-06-05 — End: 2022-06-05
  Administered 2022-06-05: 75 mL via INTRAVENOUS

## 2022-06-05 MED ORDER — ASPIRIN 81 MG PO CHEW
324.0000 mg | CHEWABLE_TABLET | Freq: Once | ORAL | Status: AC
Start: 2022-06-05 — End: 2022-06-05
  Administered 2022-06-05: 324 mg via ORAL
  Filled 2022-06-05: qty 4

## 2022-06-05 NOTE — ED Provider Notes (Signed)
[facility] Provider Note    None    (approximate)   History   Altered Mental Status   HPI  Jillian Oconnell is a 88 y.o. female with a past medical history of GERD, HTN, HDL, OA, IBS, paroxysmal SVT on metoprolol twice daily as well as chronic pain on hydrocodone baseline per review of records who presents for evaluation of some increased confusion and elevated blood pressure.  Patient states she has slight headache and some slight chest discomfort and very mild shortness of breath.  She is not sure when it started.  She denies any vision changes, vertigo, cough, abdominal pain, nausea, vomiting, diarrhea or fever or falls.  He states she was told to come to emergency room by her family to get checked out.  She states has been taking her medicines as directed.  Per family at bedside they felt her speech seemed slurred starting 2 or 3 days ago and she seemed much more forgetful than usual.  They also note she has been more short of breath than usual, has been refusing to use her inhaler.      Physical Exam  Triage Vital Signs: ED Triage Vitals [06/05/22 2153]  Enc Vitals Group     BP      Pulse      Resp      Temp      Temp src      SpO2      Weight 140 lb (63.5 kg)     Height '4\' 10"'$  (1.473 m)     Head Circumference      Peak Flow      Pain Score 0     Pain Loc      Pain Edu?      Excl. in [place]?     Most recent vital signs: Vitals:   06/05/22 2209 06/05/22 2310  BP: (!) 211/103 (!) 174/98  Pulse: 96 100  Resp: (!) 23 (!) 26  Temp:    SpO2: 94% 93%    General: Awake, no distress.  CV:  Good peripheral perfusion.  2+ radial pulses. Resp:  Slightly tachypneic with very slight wheezing.  Diminished in the left bases. Abd:  No distention.  Soft throughout Other:  Patient is ANO x3.  No pronator drift or finger dysmetria.  Patient is symmetric strength and sensation in all extremities.  Cranial nerves II through XII are grossly intact.   ED  Results / Procedures / Treatments  Labs (all labs ordered are listed, but only abnormal results are displayed) Labs Reviewed  CBC WITH DIFFERENTIAL/PLATELET - Abnormal; Notable for the following components:      Result Value   HCT 47.1 (*)    Eosinophils Absolute 0.6 (*)    All other components within normal limits  COMPREHENSIVE METABOLIC PANEL - Abnormal; Notable for the following components:   Glucose, Bld 121 (*)    Creatinine, Ser 1.07 (*)    Total Protein 8.3 (*)    GFR, Estimated 51 (*)    All other components within normal limits  URINALYSIS, COMPLETE (UACMP) WITH MICROSCOPIC - Abnormal; Notable for the following components:   Color, Urine COLORLESS (*)    APPearance CLEAR (*)    Specific Gravity, Urine 1.002 (*)    Hgb urine dipstick SMALL (*)    Leukocytes,Ua MODERATE (*)    Bacteria, UA RARE (*)    All other components within normal limits  BRAIN NATRIURETIC PEPTIDE - Abnormal; Notable for the  following components:   B Natriuretic Peptide 410.1 (*)    All other components within normal limits  TROPONIN I (HIGH SENSITIVITY) - Abnormal; Notable for the following components:   Troponin I (High Sensitivity) 41 (*)    All other components within normal limits  URINE CULTURE  SARS CORONAVIRUS 2 BY RT PCR  MAGNESIUM  TSH  T4, FREE  BLOOD GAS, VENOUS     EKG  ECG is remarkable sinus rhythm with a ventricular rate of 93, normal axis, nonspecific ST change in aVL without other clear evidence of acute ischemia or significant arrhythmia.   RADIOLOGY  CT head on my interpretation without evidence of hemorrhage, mass effect or edema but there does appear to be an age-indeterminate stroke in the left thalamus and basal ganglia areas.  Chest x-ray my interpretation shows no significant chronic appearing left hemidiaphragm elevation without clear focal consolidation pneumothorax or overt edema or large pleural effusion.  I also viewed radiology's  interpretation.   PROCEDURES:  Critical Care performed: No  .1-3 Lead EKG Interpretation  Performed by: Lucrezia Starch, MD Authorized by: Lucrezia Starch, MD     Interpretation: normal     ECG rate assessment: normal     Rhythm: sinus rhythm     Ectopy: none     Conduction: normal   .Critical Care  Performed by: Lucrezia Starch, MD Authorized by: Lucrezia Starch, MD   Critical care provider statement:    Critical care time (minutes):  30   Critical care was necessary to treat or prevent imminent or life-threatening deterioration of the following conditions:  CNS failure or compromise and cardiac failure   Critical care was time spent personally by me on the following activities:  Development of treatment plan with patient or surrogate, discussions with consultants, evaluation of patient's response to treatment, examination of patient, ordering and review of laboratory studies, ordering and review of radiographic studies, ordering and performing treatments and interventions, pulse oximetry, re-evaluation of patient's condition and review of old charts   The patient is on the cardiac monitor to evaluate for evidence of arrhythmia and/or significant heart rate changes.   MEDICATIONS ORDERED IN ED: Medications  LORazepam (ATIVAN) injection 0.5 mg (has no administration in time range)  ipratropium-albuterol (DUONEB) 0.5-2.5 (3) MG/3ML nebulizer solution 3 mL (has no administration in time range)  iohexol (OMNIPAQUE) 350 MG/ML injection 75 mL (has no administration in time range)  cefTRIAXone (ROCEPHIN) 1 g in sodium chloride 0.9 % 100 mL IVPB (has no administration in time range)  labetalol (NORMODYNE) injection 10 mg (10 mg Intravenous Given 06/05/22 2218)  aspirin chewable tablet 324 mg (324 mg Oral Given 06/05/22 2321)     IMPRESSION / MDM / ASSESSMENT AND PLAN / ED COURSE  I reviewed the triage vital signs and the nursing notes. Patient's presentation is most consistent  with acute presentation with potential threat to life or bodily function.                               Differential diagnosis includes, but is not limited to CVA, hypertensive emergency, acute infectious process, hypothyroidism and metabolic derangements.  Patient does seem fairly dyspneic and has some slight wheezing on exam.  We will give a DuoNeb and obtain a CTA to assess for PE chest x-ray some unremarkable aside from chronic left elevation of the hemidiaphragm.  ECG is remarkable sinus rhythm with a ventricular rate  of 93, normal axis, nonspecific ST change in aVL without other clear evidence of acute ischemia or significant arrhythmia.  Initial troponin is elevated at 41 possibly representing demand in setting of very high blood pressures on arrival versus ACS given she did report some chest tightness.  We will plan to trend this and give ASA.  Magnesium is WNL.  BNP is 410  CT head on my interpretation without evidence of hemorrhage, mass effect or edema but there does appear to be an age-indeterminate stroke in the left thalamus and basal ganglia areas.  CMP without significant electrolyte or metabolic derangements.  CBC without leukocytosis or acute anemia.  UA is concerning for cystitis with moderate leukocyte esterase and 11-20 WBCs with rare bacteria.  Patient does not report burning with urination.  She does seem little more confused than usual I think is reasonable to treat with some Rocephin and obtain urine culture.  All patient's speech does not seem grossly slurred to this examiner given family reports that is and she has age-indeterminate strokes on her CT head we will obtain MR brain as well as MRA head and neck as I am concerned that given the reported this started about 2 or 3 days ago it is certainly possible this is acute to subacute stroke.  Care patient signed over to sending provider at approximately 2300 with plan to follow-up CTA chest and admit to medicine service for  further evaluation and management.  We will defer heparin until an acute stroke has been ruled on patient's MR brain.   FINAL CLINICAL IMPRESSION(S) / ED DIAGNOSES   Final diagnoses:  Altered mental status, unspecified altered mental status type  Abnormal head CT  Acute cystitis without hematuria  Troponin I above reference range  Chest pain, unspecified type  SOB (shortness of breath)     Rx / DC Orders   ED Discharge Orders     None        Note:  This document was prepared using Dragon voice recognition software and may include unintentional dictation errors.   Lucrezia Starch, MD 06/05/22 [phone]

## 2022-06-05 NOTE — ED Triage Notes (Addendum)
pt coming from home, family called out for the past few days of confusion , however pt A&Ox4, pt ambulatory, bp 210/100, hx HTN, pt took BP meds at home this morning but not tonight; cbg 132 no hx diabetes.

## 2022-06-05 NOTE — ED Notes (Signed)
Pt placed on a purewick at this time.

## 2022-06-05 NOTE — ED Notes (Signed)
Patient plugged back up to the purewick at this time.

## 2022-06-06 ENCOUNTER — Inpatient Hospital Stay (HOSPITAL_COMMUNITY)
Admit: 2022-06-06 | Discharge: 2022-06-06 | Disposition: A | Discharge status: Home / Self Care | Payer: Medicare HMO | Attending: Internal Medicine | Admitting: Internal Medicine

## 2022-06-06 ENCOUNTER — Inpatient Hospital Stay: Admit: 2022-06-06 | Payer: Medicare HMO

## 2022-06-06 DIAGNOSIS — Z8673 Personal history of transient ischemic attack (TIA), and cerebral infarction without residual deficits: Secondary | ICD-10-CM

## 2022-06-06 DIAGNOSIS — J45909 Unspecified asthma, uncomplicated: Secondary | ICD-10-CM | POA: Diagnosis present

## 2022-06-06 DIAGNOSIS — E785 Hyperlipidemia, unspecified: Secondary | ICD-10-CM | POA: Diagnosis present

## 2022-06-06 DIAGNOSIS — Z7902 Long term (current) use of antithrombotics/antiplatelets: Secondary | ICD-10-CM | POA: Diagnosis not present

## 2022-06-06 DIAGNOSIS — K649 Unspecified hemorrhoids: Secondary | ICD-10-CM | POA: Diagnosis present

## 2022-06-06 DIAGNOSIS — Z800 Family history of malignant neoplasm of digestive organs: Secondary | ICD-10-CM | POA: Diagnosis not present

## 2022-06-06 DIAGNOSIS — K219 Gastro-esophageal reflux disease without esophagitis: Secondary | ICD-10-CM | POA: Diagnosis present

## 2022-06-06 DIAGNOSIS — G459 Transient cerebral ischemic attack, unspecified: Secondary | ICD-10-CM | POA: Diagnosis present

## 2022-06-06 DIAGNOSIS — Z20822 Contact with and (suspected) exposure to covid-19: Secondary | ICD-10-CM | POA: Diagnosis present

## 2022-06-06 DIAGNOSIS — I6389 Other cerebral infarction: Secondary | ICD-10-CM

## 2022-06-06 DIAGNOSIS — I639 Cerebral infarction, unspecified: Secondary | ICD-10-CM | POA: Diagnosis not present

## 2022-06-06 DIAGNOSIS — G8929 Other chronic pain: Secondary | ICD-10-CM | POA: Diagnosis present

## 2022-06-06 DIAGNOSIS — Z8249 Family history of ischemic heart disease and other diseases of the circulatory system: Secondary | ICD-10-CM | POA: Diagnosis not present

## 2022-06-06 DIAGNOSIS — I10 Essential (primary) hypertension: Secondary | ICD-10-CM | POA: Diagnosis present

## 2022-06-06 DIAGNOSIS — R4781 Slurred speech: Secondary | ICD-10-CM | POA: Diagnosis present

## 2022-06-06 DIAGNOSIS — E119 Type 2 diabetes mellitus without complications: Secondary | ICD-10-CM | POA: Diagnosis present

## 2022-06-06 DIAGNOSIS — R451 Restlessness and agitation: Secondary | ICD-10-CM | POA: Diagnosis present

## 2022-06-06 DIAGNOSIS — Z66 Do not resuscitate: Secondary | ICD-10-CM | POA: Diagnosis present

## 2022-06-06 DIAGNOSIS — F32A Depression, unspecified: Secondary | ICD-10-CM | POA: Diagnosis present

## 2022-06-06 DIAGNOSIS — N390 Urinary tract infection, site not specified: Secondary | ICD-10-CM

## 2022-06-06 DIAGNOSIS — Z7982 Long term (current) use of aspirin: Secondary | ICD-10-CM | POA: Diagnosis not present

## 2022-06-06 DIAGNOSIS — Z79899 Other long term (current) drug therapy: Secondary | ICD-10-CM | POA: Diagnosis not present

## 2022-06-06 DIAGNOSIS — R29700 NIHSS score 0: Secondary | ICD-10-CM | POA: Diagnosis present

## 2022-06-06 DIAGNOSIS — I160 Hypertensive urgency: Secondary | ICD-10-CM | POA: Diagnosis present

## 2022-06-06 DIAGNOSIS — F419 Anxiety disorder, unspecified: Secondary | ICD-10-CM | POA: Diagnosis present

## 2022-06-06 DIAGNOSIS — E039 Hypothyroidism, unspecified: Secondary | ICD-10-CM | POA: Diagnosis present

## 2022-06-06 DIAGNOSIS — I471 Supraventricular tachycardia: Secondary | ICD-10-CM | POA: Diagnosis present

## 2022-06-06 DIAGNOSIS — Z803 Family history of malignant neoplasm of breast: Secondary | ICD-10-CM | POA: Diagnosis not present

## 2022-06-06 LAB — GLUCOSE, CAPILLARY
Glucose-Capillary: 112 mg/dL — ABNORMAL HIGH (ref 70–99)
Glucose-Capillary: 122 mg/dL — ABNORMAL HIGH (ref 70–99)

## 2022-06-06 LAB — BLOOD GAS, VENOUS
Acid-Base Excess: 8.2 mmol/L — ABNORMAL HIGH (ref 0.0–2.0)
Bicarbonate: 35.8 mmol/L — ABNORMAL HIGH (ref 20.0–28.0)
O2 Saturation: 33.2 %
Patient temperature: 37
pCO2, Ven: 62 mmHg — ABNORMAL HIGH (ref 44–60)
pH, Ven: 7.37 (ref 7.25–7.43)
pO2, Ven: <31 mmHg — CL (ref 32–45)

## 2022-06-06 LAB — LIPID PANEL
Cholesterol: 166 mg/dL (ref 0–200)
HDL: 64 mg/dL (ref >40)
LDL Cholesterol: 90 mg/dL (ref 0–99)
Total CHOL/HDL Ratio: 2.6 RATIO
Triglycerides: 60 mg/dL (ref <150)
VLDL: 12 mg/dL (ref 0–40)

## 2022-06-06 LAB — URINE DRUG SCREEN, QUALITATIVE (ARMC ONLY)
Amphetamines, Ur Screen: NOT DETECTED
Barbiturates, Ur Screen: NOT DETECTED
Benzodiazepine, Ur Scrn: NOT DETECTED
Cannabinoid 50 Ng, Ur ~~LOC~~: NOT DETECTED
Cocaine Metabolite,Ur ~~LOC~~: NOT DETECTED
MDMA (Ecstasy)Ur Screen: NOT DETECTED
Methadone Scn, Ur: NOT DETECTED
Opiate, Ur Screen: POSITIVE — AB
Phencyclidine (PCP) Ur S: NOT DETECTED
Tricyclic, Ur Screen: NOT DETECTED

## 2022-06-06 LAB — ECHOCARDIOGRAM COMPLETE
Height: 58 in
S' Lateral: 2 cm
Weight: 2240 oz

## 2022-06-06 LAB — HEMOGLOBIN A1C
Hgb A1c MFr Bld: 5.5 % (ref 4.8–5.6)
Mean Plasma Glucose: 111.15 mg/dL

## 2022-06-06 LAB — TSH: TSH: 2.173 u[IU]/mL (ref 0.350–4.500)

## 2022-06-06 LAB — SARS CORONAVIRUS 2 BY RT PCR: SARS Coronavirus 2 by RT PCR: NEGATIVE

## 2022-06-06 LAB — T4, FREE: Free T4: 1.27 ng/dL — ABNORMAL HIGH (ref 0.61–1.12)

## 2022-06-06 MED ORDER — PANTOPRAZOLE SODIUM 20 MG PO TBEC: 20.0000 mg | DELAYED_RELEASE_TABLET | Freq: Every day | ORAL | Status: DC

## 2022-06-06 MED ORDER — SODIUM CHLORIDE 0.9 % IV SOLN: INTRAVENOUS | Status: AC

## 2022-06-06 MED ORDER — ALBUTEROL SULFATE (2.5 MG/3ML) 0.083% IN NEBU: 3.0000 mL | INHALATION_SOLUTION | Freq: Four times a day (QID) | RESPIRATORY_TRACT | Status: DC | PRN

## 2022-06-06 MED ORDER — ASPIRIN 81 MG PO TBEC
81.0000 mg | DELAYED_RELEASE_TABLET | Freq: Every day | ORAL | Status: DC
  Administered 2022-06-06 – 2022-06-07 (×2): 81 mg via ORAL
  Filled 2022-06-06 (×3): qty 1

## 2022-06-06 MED ORDER — CIPROFLOXACIN IN D5W 400 MG/200ML IV SOLN
400.0000 mg | Freq: Two times a day (BID) | INTRAVENOUS | Status: DC
  Administered 2022-06-06 – 2022-06-07 (×3): 400 mg via INTRAVENOUS
  Filled 2022-06-06 (×4): qty 200

## 2022-06-06 MED ORDER — CLOPIDOGREL BISULFATE 75 MG PO TABS
75.0000 mg | ORAL_TABLET | Freq: Every day | ORAL | Status: DC
  Administered 2022-06-07: 75 mg via ORAL
  Filled 2022-06-06: qty 1

## 2022-06-06 MED ORDER — FLUTICASONE PROPIONATE (INHAL) 250 MCG/ACT IN AEPB: 1.0000 | INHALATION_SPRAY | Freq: Two times a day (BID) | RESPIRATORY_TRACT | Status: DC

## 2022-06-06 MED ORDER — ACETAMINOPHEN 325 MG PO TABS
650.0000 mg | ORAL_TABLET | ORAL | Status: DC | PRN
  Administered 2022-06-06: 650 mg via ORAL
  Filled 2022-06-06 (×2): qty 2

## 2022-06-06 MED ORDER — BUDESONIDE 0.25 MG/2ML IN SUSP
0.2500 mg | Freq: Two times a day (BID) | RESPIRATORY_TRACT | Status: DC
  Administered 2022-06-07: 0.25 mg via RESPIRATORY_TRACT
  Filled 2022-06-06 (×3): qty 2

## 2022-06-06 MED ORDER — ASPIRIN 300 MG RE SUPP
300.0000 mg | Freq: Every day | RECTAL | Status: DC
  Filled 2022-06-06: qty 1

## 2022-06-06 MED ORDER — METOPROLOL SUCCINATE ER 50 MG PO TB24
100.0000 mg | ORAL_TABLET | Freq: Every day | ORAL | Status: DC
  Administered 2022-06-06 – 2022-06-07 (×2): 100 mg via ORAL
  Filled 2022-06-06 (×2): qty 2

## 2022-06-06 MED ORDER — ACETAMINOPHEN 160 MG/5ML PO SOLN
650.0000 mg | ORAL | Status: DC | PRN
  Filled 2022-06-06: qty 20.3

## 2022-06-06 MED ORDER — LORAZEPAM 0.5 MG PO TABS
0.5000 mg | ORAL_TABLET | Freq: Two times a day (BID) | ORAL | Status: DC | PRN
  Administered 2022-06-06: 0.5 mg via ORAL
  Filled 2022-06-06 (×2): qty 1

## 2022-06-06 MED ORDER — STROKE: EARLY STAGES OF RECOVERY BOOK: Freq: Once | Status: AC

## 2022-06-06 MED ORDER — LEVOTHYROXINE SODIUM 25 MCG PO TABS
25.0000 ug | ORAL_TABLET | Freq: Every day | ORAL | Status: DC
  Administered 2022-06-06 – 2022-06-07 (×2): 25 ug via ORAL
  Filled 2022-06-06 (×2): qty 1

## 2022-06-06 MED ORDER — ALUM & MAG HYDROXIDE-SIMETH 200-200-20 MG/5ML PO SUSP
30.0000 mL | Freq: Once | ORAL | Status: DC
  Filled 2022-06-06: qty 30

## 2022-06-06 MED ORDER — PANTOPRAZOLE SODIUM 40 MG PO TBEC
40.0000 mg | DELAYED_RELEASE_TABLET | Freq: Every day | ORAL | Status: DC
  Administered 2022-06-06 – 2022-06-07 (×2): 40 mg via ORAL
  Filled 2022-06-06 (×2): qty 1

## 2022-06-06 MED ORDER — CLOPIDOGREL BISULFATE 75 MG PO TABS
300.0000 mg | ORAL_TABLET | Freq: Once | ORAL | Status: AC
  Administered 2022-06-06: 300 mg via ORAL
  Filled 2022-06-06 (×2): qty 4

## 2022-06-06 MED ORDER — SENNOSIDES-DOCUSATE SODIUM 8.6-50 MG PO TABS
1.0000 | ORAL_TABLET | Freq: Every evening | ORAL | Status: DC | PRN
Start: 2022-06-06 — End: 2022-06-07

## 2022-06-06 MED ORDER — HYDRALAZINE HCL 20 MG/ML IJ SOLN
10.0000 mg | Freq: Four times a day (QID) | INTRAMUSCULAR | Status: DC | PRN
Start: 2022-06-06 — End: 2022-06-07
  Administered 2022-06-06: 10 mg via INTRAVENOUS
  Filled 2022-06-06: qty 1

## 2022-06-06 MED ORDER — ACETAMINOPHEN 650 MG RE SUPP: 650.0000 mg | RECTAL | Status: DC | PRN

## 2022-06-06 MED ORDER — ASPIRIN 325 MG PO TBEC
325.0000 mg | DELAYED_RELEASE_TABLET | Freq: Every day | ORAL | Status: DC
  Filled 2022-06-06: qty 1

## 2022-06-06 MED ORDER — GADOBUTROL 1 MMOL/ML IV SOLN
7.0000 mL | Freq: Once | INTRAVENOUS | Status: AC | PRN
Start: 2022-06-06 — End: 2022-06-06
  Administered 2022-06-06: 7 mL via INTRAVENOUS

## 2022-06-06 MED ORDER — FLUTICASONE PROPIONATE 50 MCG/ACT NA SUSP
2.0000 | Freq: Every day | NASAL | Status: DC
  Administered 2022-06-06 – 2022-06-07 (×2): 2 via NASAL
  Filled 2022-06-06: qty 16

## 2022-06-06 MED ORDER — MONTELUKAST SODIUM 10 MG PO TABS
10.0000 mg | ORAL_TABLET | Freq: Every day | ORAL | Status: DC
  Administered 2022-06-06 (×2): 10 mg via ORAL
  Filled 2022-06-06 (×2): qty 1

## 2022-06-06 NOTE — Progress Notes (Signed)
SLP Cancellation Note  Patient Details Name: Jillian Oconnell MRN: [id number] DOB: 10-27-1934   Cancelled treatment:       Reason Eval/Treat Not Completed: SLP screened, no needs identified, will sign off (chart reviewed; consulted NSG. Met w/ pt and Family in room.) Upon meeting pt/Family in room, all said that pt is "so much better now than when the PT came by". Pt was sitting up in bed on many pillows blowing her nose. Verbal and engaging w/ Family present. She had no c/o pain and stated she felt "fine now".  Pt verbally communicated (appropriately) to answer general questions re: herself and the foods she likes to eat; low volume/soft voice which Family stated was baseline for her. Noted Neurology note: "oriented to person, place, month, year, and situation. No signs of aphasia or neglect.".  They described that at home, pt only walked out into the [place] "about 1 time a week"; otherwise she was sedentary in the house.  For po intake, pt only eats Twinkies, strudels, and waffles and drinks Gatorade, per the Family in the room. Gave them a menu and encouraged them to bring in pt's favorite foods. Dietician updated.   No immediate Speech needs indicated currently, but will monitor during this admit for any potential needs in setting of subcortical infarct and severe hypertension -- NSG to reconsult if new needs. Pt and Family agreed. MD updated, agreed.      Orinda Kenner, MS, CCC-SLP Speech Language Pathologist Rehab Services; [place] [phone] (ascom) Antionetta Ator 06/06/2022, 4:49 PM

## 2022-06-06 NOTE — Progress Notes (Signed)
Patient was complaining of belly pain. Initially she did not respond to my voice when I approached her, but with minimal stimulation she became interactive, neurologically unchanged from earlier. She is complaining of stomach pain, but her belly is soft. I asked for BP check and BG check, IM to assess belly pain.   Roland Rack, MD [facility] [phone]  If 7pm- 7am, please page neurology on call as listed in [place].

## 2022-06-06 NOTE — Progress Notes (Signed)
Admission profile updated. ?

## 2022-06-06 NOTE — Progress Notes (Signed)
PT refused .25 mg Pulmicort Neb, reports Neb's make her feel funny, and is currently in NARD or SOB. Currently on room air, and stable at this time. PT informed to request RT if she experiences any SOB or difficulty breathing.

## 2022-06-06 NOTE — ED Notes (Signed)
   06/06/22 1035  Vitals  BP (!) 204/91  MAP (mmHg) 125  Pulse Rate 88  ECG Heart Rate 90  Resp (!) 25  MEWS COLOR  MEWS Score Color Yellow  Oxygen Therapy  SpO2 95 %  MEWS Score  MEWS Temp 0  MEWS Systolic 2  MEWS Pulse 0  MEWS RR 1  MEWS LOC 0  MEWS Score 3   Md made aware. Orders given for PRN hydralazine. Gave PRN, recheck is documented in flowsheets.

## 2022-06-06 NOTE — Plan of Care (Signed)

## 2022-06-06 NOTE — Assessment & Plan Note (Addendum)
Slurred speech and confusion intermittent x 2-3 days.  Concern for HTN urgency/emergency as well, see below  CT head in ED negative for acute finding   On admission per H&P, neuro exam no focal neuro deficits   06/06/22 MRI brain, MRA head/neck: "1. 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 2. MRA head is limited by motion. Within this limitation, no intracranial large vessel occlusion or severe stenosis. 3. No hemodynamically significant stenosis in the neck."  Uncontrolled blood pressure --> permissive HTN initially  Received Aspirin  Secondary prevention: ASA + Plavix  neuro checks, SLP, PT/OT   Echo: EF 58-85, no cardio-embolic concerns   Neurology consult - see note, following. Appreciate recommendations re: discharge readiness   ASA + Plavix  Lipid control - low potency statin vs PCSK9

## 2022-06-06 NOTE — Progress Notes (Addendum)
Patients daughter came out of room and requested something for her mother for pain, went to room with tylenol and her other scheduled medications patient was doubled over c/o stomach pain right leg pain and headache. The daughter stated that patient needed something for anxiety so went to get Lorazepam when I came back patient would barely respond to me and she finally laid her head down and was sleeping but would move if I shook her arm the family told me to not worry about her medications right now not to give them (which I wasn't apparently she wasn't alert enough) and they refused for [place] to take vitals because patient is different from earlier. Family stated she is just sleepy.

## 2022-06-06 NOTE — Plan of Care (Signed)
  Problem: Education: Goal: Knowledge of General Education information will improve Description: Including pain rating scale, medication(s)/side effects and non-pharmacologic comfort measures 06/06/2022 1512 by Mancel Bale, RN Outcome: Progressing 06/06/2022 1228 by Mancel Bale, RN Outcome: Progressing   Problem: Health Behavior/Discharge Planning: Goal: Ability to manage health-related needs will improve 06/06/2022 1512 by Mancel Bale, RN Outcome: Progressing 06/06/2022 1228 by Mancel Bale, RN Outcome: Progressing   Problem: Clinical Measurements: Goal: Ability to maintain clinical measurements within normal limits will improve 06/06/2022 1512 by Mancel Bale, RN Outcome: Progressing 06/06/2022 1228 by Mancel Bale, RN Outcome: Progressing Goal: Will remain free from infection 06/06/2022 1512 by Mancel Bale, RN Outcome: Progressing 06/06/2022 1228 by Mancel Bale, RN Outcome: Progressing Goal: Diagnostic test results will improve 06/06/2022 1512 by Mancel Bale, RN Outcome: Progressing 06/06/2022 1228 by Mancel Bale, RN Outcome: Progressing Goal: Respiratory complications will improve 06/06/2022 1512 by Mancel Bale, RN Outcome: Progressing 06/06/2022 1228 by Mancel Bale, RN Outcome: Progressing Goal: Cardiovascular complication will be avoided 06/06/2022 1512 by Mancel Bale, RN Outcome: Progressing 06/06/2022 1228 by Mancel Bale, RN Outcome: Progressing   Problem: Activity: Goal: Risk for activity intolerance will decrease 06/06/2022 1512 by Mancel Bale, RN Outcome: Progressing 06/06/2022 1228 by Mancel Bale, RN Outcome: Progressing   Problem: Nutrition: Goal: Adequate nutrition will be maintained 06/06/2022 1512 by Mancel Bale, RN Outcome: Progressing 06/06/2022 1228 by Mancel Bale, RN Outcome: Progressing   Problem: Coping: Goal: Level of anxiety will decrease 06/06/2022 1512 by Mancel Bale, RN Outcome: Progressing 06/06/2022 1228 by Mancel Bale, RN Outcome: Progressing   Problem: Elimination: Goal: Will not experience complications related to bowel motility 06/06/2022 1512 by Mancel Bale, RN Outcome: Progressing 06/06/2022 1228 by Mancel Bale, RN Outcome: Progressing Goal: Will not experience complications related to urinary retention 06/06/2022 1512 by Mancel Bale, RN Outcome: Progressing 06/06/2022 1228 by Mancel Bale, RN Outcome: Progressing   Problem: Pain Managment: Goal: General experience of comfort will improve 06/06/2022 1512 by Mancel Bale, RN Outcome: Progressing 06/06/2022 1228 by Mancel Bale, RN Outcome: Progressing   Problem: Safety: Goal: Ability to remain free from injury will improve 06/06/2022 1512 by Mancel Bale, RN Outcome: Progressing 06/06/2022 1228 by Mancel Bale, RN Outcome: Progressing   Problem: Skin Integrity: Goal: Risk for impaired skin integrity will decrease 06/06/2022 1512 by Mancel Bale, RN Outcome: Progressing 06/06/2022 1228 by Mancel Bale, RN Outcome: Progressing   Problem: Education: Goal: Knowledge of disease or condition will improve Outcome: Progressing Goal: Knowledge of secondary prevention will improve (SELECT ALL) Outcome: Progressing Goal: Knowledge of patient specific risk factors will improve (INDIVIDUALIZE FOR PATIENT) Outcome: Progressing

## 2022-06-06 NOTE — Progress Notes (Signed)
  Brief Progress Note (See full H&P from earlier today)   Subjective: Patient seen and examined in the emergency department this morning, daughter present at bedside.  Patient and daughter reports symptoms of slurred speech/confusion have resolved.  No additional complaints at this time.  Reports that she has had stomach problems in the past with aspirin, no history of GI bleed   Objective: Relevant new results:  Patient has passed swallow study MRI/MRA results, new acute infarct, no significant vascular concerns Physical Exam:  BP (!) 150/55   Pulse 96   Temp 98.8 F (37.1 C) (Oral)   Resp (!) 22   Ht '4\' 10"'$  (1.473 m)   Wt 63.5 kg   SpO2 96%   BMI 29.26 kg/m  Constitutional:  General Appearance: alert, well-developed, well-nourished, NAD Respiratory: Normal respiratory effort Breath sounds normal, no wheeze/rhonchi/rales Cardiovascular: S1/S2 normal, no murmur/rub/gallop auscultated No lower extremity edema Gastrointestinal: Nontender, no masses Musculoskeletal:  No clubbing/cyanosis of digits Neurological: No cranial nerve deficit on limited exam Psychiatric: Normal judgment/insight Normal mood and affect   Assessment/Plan:  CVA (cerebral vascular accident) ([place]) Slurred speech and confusion intermittent x 2-3 days.  Concern for HTN urgency/emergency as well, see below CT head negative for acute finding  On admission per H&P, neuro exam no focal neuro deficits  06/06/22 MRI brain, MRA head/neck: "1. 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 2. MRA head is limited by motion. Within this limitation, no intracranial large vessel occlusion or severe stenosis. 3. No hemodynamically significant stenosis in the neck." Uncontrolled blood pressure --> permissive HTN initially Received Aspirin Secondary prevention: ASA + Plavix neuro checks , SLP, PT/OT  Echo report pending Neurology consult pending notes

## 2022-06-06 NOTE — ED Notes (Signed)
Pt to MRI at this time.

## 2022-06-06 NOTE — Progress Notes (Signed)
PT Cancellation Note  Patient Details Name: Jillian Oconnell MRN: [id number] DOB: [birth year]   Cancelled Treatment:    Reason Eval/Treat Not Completed: Other (comment) Attempted to see pt X 3 this date.  In the AM BP was high and nursing requested to hold, later she complained of belly pain and was soon transferring to the floor.  Later on the floor she was feeling even worse, very sleepy and ultimately refused PT today citing simply feeling too poorly to even try.  Family in room and agree that holding PT today is a good idea.    Kreg Shropshire, DPT 06/06/2022, 3:39 PM

## 2022-06-06 NOTE — Progress Notes (Signed)
*  PRELIMINARY RESULTS* Echocardiogram 2D Echocardiogram has been performed.  Sherrie Sport 06/06/2022, 1:21 PM

## 2022-06-06 NOTE — ED Notes (Signed)
Informed RN bed assigned 

## 2022-06-06 NOTE — Evaluation (Signed)
Occupational Therapy Evaluation Patient Details Name: Jillian Oconnell MRN: [id number] DOB: 27-Mar-1934 Today's Date: 06/06/2022   History of Present Illness Pt is a 88 y/o F who presents w/ CVA. PMH: Asthma, anxiety, gerd, depression , HTN ,DMII, HLD , paroxysmal SVT on metoprolol bid, and chronic pain.   Clinical Impression   Pt was seen for OT evaluation this date. Prior to hospital admission, pt required some assistance with ADLs. Pt lives with daughter in a house. Per grand daughter, pt's daughter works from home and able to provide assistance. Pt presents to acute OT demonstrating impaired ADL performance and functional mobility 2/2 decreased activity tolerance and functional strength/balance deficits. Pt currently requires SUPERVISION with vcs for sequencing and extra time for supine<>sit, MIN A for STS, CGA, with no AD with min vcs for safe hand placment for BSC t/f. SUPERVISION for pericare while seated. SUPERVISION/SETUP for threading/unthreading underpants while seated - required vcs for problem solving and CGA, with no AD to pulling underpants into place in standing - noted posterior lean upon standing but no observed LOB. UE ROM/strength WFL. Pt left in bed with all needs met, grand daughter at bedside. Nurse notified purewick was removed. Pt would benefit from skilled OT to address noted impairments and functional limitations (see below for any additional details). Upon hospital discharge, recommend HHOT to maximize pt safety and return to PLOF.      Recommendations for follow up therapy are one component of a multi-disciplinary discharge planning process, led by the attending physician.  Recommendations may be updated based on patient status, additional functional criteria and insurance authorization.   Follow Up Recommendations  Home health OT    Assistance Recommended at Discharge Intermittent Supervision/Assistance  Patient can return home with the following A little help with walking  and/or transfers;A little help with bathing/dressing/bathroom;Assistance with cooking/housework;Direct supervision/assist for medications management;Assist for transportation    Functional Status Assessment  Patient has had a recent decline in their functional status and demonstrates the ability to make significant improvements in function in a reasonable and predictable amount of time.  Equipment Recommendations  None recommended by OT    Recommendations for Other Services       Precautions / Restrictions Precautions Precautions: Fall Restrictions Weight Bearing Restrictions: No      Mobility Bed Mobility Overal bed mobility: Needs Assistance Bed Mobility: Supine to Sit, Sit to Supine     Supine to sit: Supervision Sit to supine: Supervision        Transfers Overall transfer level: Needs assistance Equipment used: None Transfers: Sit to/from Stand, Bed to chair/wheelchair/BSC Sit to Stand: Min assist     Step pivot transfers: Min guard            Balance Overall balance assessment: Needs assistance Sitting-balance support: No upper extremity supported, Feet supported Sitting balance-Leahy Scale: Fair     Standing balance support: During functional activity, No upper extremity supported Standing balance-Leahy Scale: Fair                             ADL either performed or assessed with clinical judgement   ADL Overall ADL's : Needs assistance/impaired                                       General ADL Comments: CGA, with no AD with min vcs for safe hand  placment for [facility] t/f. SUPERVISION for pericare while seated. SUPERVISION/SETUP for threading/unthreading underpants while seated and CGA, with no AD to pulling underpants into place in standing - required vcs for problem solving.      Pertinent Vitals/Pain Pain Assessment Pain Assessment: Faces Faces Pain Scale: No hurt     Hand Dominance     Extremity/Trunk Assessment  Upper Extremity Assessment Upper Extremity Assessment: Overall WFL for tasks assessed   Lower Extremity Assessment Lower Extremity Assessment: Defer to PT evaluation       Communication Communication Communication: No difficulties   Cognition Arousal/Alertness: Awake/alert Behavior During Therapy: WFL for tasks assessed/performed                                                    Home Living Family/patient expects to be discharged to:: Private residence Living Arrangements: Children;Other (Comment) (Pt reported she lives with daughter) Available Help at Discharge: Family;Available 24 hours/day (Pt reports daughter works from home) Type of Home: Engineer, petroleum: Standard     Home Equipment: BSC/3in1          Prior Functioning/Environment Prior Level of Function : Needs assist       Physical Assist : ADLs (physical)   ADLs (physical): Bathing;Dressing   ADLs Comments: Pt reports daughter sometimes helps with dressing and bathing.        OT Problem List: Decreased activity tolerance;Decreased safety awareness      OT Treatment/Interventions: Self-care/ADL training;Therapeutic exercise;Energy conservation;Therapeutic activities;Patient/family education;Balance training    OT Goals(Current goals can be found in the care plan section) Acute Rehab OT Goals Patient Stated Goal: to return to PLOF OT Goal Formulation: With patient Time For Goal Achievement: 06/20/22 Potential to Achieve Goals: Good  OT Frequency: Min 2X/week       AM-PAC OT "6 Clicks" Daily Activity     Outcome Measure Help from another person eating meals?: A Little Help from another person taking care of personal grooming?: A Little Help from another person toileting, which includes using toliet, bedpan, or urinal?: A Little Help from another person bathing (including washing, rinsing, drying)?: A Little Help from another person to put on and  taking off regular upper body clothing?: A Little Help from another person to put on and taking off regular lower body clothing?: A Little 6 Click Score: 18   End of Session Equipment Utilized During Treatment: Gait belt Nurse Communication: Other (comment) (removed purewick)  Activity Tolerance: Patient tolerated treatment well Patient left: in bed;with call bell/phone within reach;with bed alarm set;with family/visitor present  OT Visit Diagnosis: Muscle weakness (generalized) (M62.81)                Time: 2563-8937 OT Time Calculation (min): 35 min Charges:  OT General Charges $OT Visit: 1 Visit OT Evaluation $OT Eval Moderate Complexity: 1 Mod OT Treatments $Self Care/Home Management : 23-37 mins  [employer], OTDS  [employer] 06/06/2022, 12:45 PM

## 2022-06-06 NOTE — Consult Note (Signed)
Neurology Consultation Reason for Consult: Stroke Referring Physician: Manon Hilding  CC: Slurred speech  History is obtained from: Daughter  HPI: Jillian Oconnell is a 88 y.o. female with a history of hypertension, hyperlipidemia, paroxysmal supraventricular tachycardia, palpitations who presents with slurred speech and confusion.  She has been slightly confused for a couple of days.  She was found to be severely hypertensive, though she normally runs low, like in the 80s.  She was brought into the emergency department where an MRI confirmed a subcortical infarct on the right.   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 She has never used smokeless tobacco. She reports that she does not drink alcohol and does not use drugs.   Exam: Current vital signs: BP (!) 150/55   Pulse 96   Temp 98.8 F (37.1 C) (Oral)   Resp (!) 22   Ht '4\' 10"'$  (1.473 m)   Wt 63.5 kg   SpO2 96%    BMI 29.26 kg/m  Vital signs in last 24 hours: Temp:  [97.7 F (36.5 C)-98.8 F (37.1 C)] 98.8 F (37.1 C) (06/21 0600) Pulse Rate:  [87-100] 96 (06/21 1119) Resp:  [19-26] 22 (06/21 1119) BP: (128-225)/(55-131) 150/55 (06/21 1119) SpO2:  [93 %-100 %] 96 % (06/21 1119) Weight:  [63.5 kg] 63.5 kg (06/20 2153)   Physical Exam  Constitutional: Appears well-developed and well-nourished.  Psych: Affect appropriate to situation Eyes: No scleral injection HENT: No OP obstruction MSK: no joint deformities.  Cardiovascular: Normal rate and regular rhythm.  Respiratory: Effort normal, non-labored breathing GI: Soft.  No distension. There is no tenderness.  Skin: WDI  Neuro: Mental Status: Patient is awake, alert, oriented to person, place, month, year, and situation. No signs of aphasia or neglect Cranial Nerves: II: Visual Fields are full. Pupils are equal, round, and reactive to light.   III,IV, VI: EOMI without ptosis or diploplia.  V: Facial sensation is symmetric to temperature VII: Facial movement is symmetric.  VIII: hearing is intact to voice X: Uvula elevates symmetrically XI: Shoulder shrug  is symmetric. XII: tongue is midline without atrophy or fasciculations.  Motor: Tone is normal. Bulk is normal. 5/5 strength was present in bilateral arms to confrontation, but mild left drift. She has 4/5 left leg weakness Sensory: Sensation is diminished in the left leg Cerebellar: Does not perform     I have reviewed labs in epic and the results pertinent to this consultation are: Creatinine 1.07 LDL 115  I have reviewed the images obtained: MRI brain-large subcortical infarct on the right, slightly discontinuous but all in the same general vicinity.  Impression: 88 year old female with large subcortical infarct.  My suspicion is that this could represent small vessel disease, but the discontinuity as well as the size is unusual.  She will need work-up for embolic causes  as well.  Recommendations: -She will need some type of lipid management but has had myalgias with multiple statins, will discuss with her tomorrow trying a less intense statin versus PCSK9 inhibitor. - MRI of the brain without contrast - Frequent neuro checks - Echocardiogram - CTA head and neck - Prophylactic therapy-Antiplatelet med: Aspirin - dose '81mg'$  and plavix '75mg'$  daily  after '300mg'$  load  - Risk factor modification - Telemetry monitoring - PT consult, OT consult, Speech consult - Stroke team to follow    Roland Rack, MD [facility] [phone]  If 7pm- 7am, please page neurology on call as listed in [place].

## 2022-06-06 NOTE — H&P (Signed)
History and Physical    Pina Sirianni Colver NGE:[id number] DOB: [birth year] DOA: 06/05/2022  PCP: Tonia Ghent, MD  Patient coming from: home  I have personally briefly reviewed patient's old medical records in [place]  Chief Complaint: intermittent slurred speech and confusion   HPI: Jillian Oconnell is a 88 y.o. female with medical history significant of  Asthma, anxiety, gerd, depression , HTN ,DMII, HLD , paroxysmal SVT on metoprolol bid, chronic pain.  Patient presents  brought in by family due to a few days of intermittent confusion and slurred speech in setting of elevated blood pressure. Family has also noted that patient has  had increase sob. Patient does endorse mild sob, mild chest pain as well as HA but overall feels at her baseline health. Although family disagrees as stated above.  Patient currently denies any sob ,chest pain ,n/v/d/dysuria. Per family patient at baseline complete all her ADLS and is very active.   ED Course:  Afeb, bp 225/120, sat 94%  Labs  Wbc 6.2, hgb 14.7, plt 259 UA: +Le, + bact , + wbc  NA 140, k 3.9, gly 121, cr 1.07,  BNP 410.1  Ce 41  Mag 1.9 T4 1.27 Cxr :nad CT head 1. No CT evidence for acute intracranial abnormality. 2. Age indeterminate lacunar infarcts within the left thalamus and bilateral basal ganglia 3. Atrophy and mild chronic small vessel ischemic changes of the white matter  CTPA 1. No CT evidence of central pulmonary artery embolus. 2. Significant elevation of the left hemidiaphragm with left lung base atelectasis. 3. Aortic Atherosclerosis (ICD10-I70.0). Tx in ed labetol 10 mg x 1 Ativan 0.5 mgh iv x1  MRI berainde /angio pending  Review of Systems: As per HPI otherwise 10 point review of systems negative.   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 Minor luminal irregularities with no evidence of obstructive disease.   cataract surgery     [place]  2004   TOTAL ABDOMINAL HYSTERECTOMY       reports that she has never smoked. She has never used smokeless tobacco. She reports that she does not drink alcohol and does not use drugs.  Allergies  Allergen Reactions   Albuterol     REACTION: jittery   Alendronate Sodium     REACTION: GI side eff   Amoxicillin-Pot Clavulanate     REACTION: non tolerant- but can take plain amox   Atorvastatin Nausea And Vomiting   Azithromycin     GI upset.  Not an allergy.   Ezetimibe-Simvastatin     REACTION: myalgia   Gabapentin     REACTION: tremor   Ibandronate Sodium     REACTION: GI side eff   Lexapro [Escitalopram Oxalate]     sedation   Lyrica [Pregabalin] Other (See Comments)    heart racing and didn't help pain.    Neosporin [Bacitracin-Polymyxin B]     Irritation locally   Risedronate Sodium     REACTION: GI side eff   Rosuvastatin     REACTION: myalgia   Sertraline     sedation   Sulfonamide Derivatives     REACTION: not tolerate    Family History  Problem Relation Age of Onset   Hypertension Mother    Breast cancer Mother 62   Cancer Sister        colon   Colon cancer Sister    Hypertension Father    Aneurysm Father        AAA   Cancer Daughter        thyroid   Anxiety  disorder Daughter    Breast cancer Daughter 10   Breast cancer Daughter     Prior to Admission medications   Medication Sig Start Date End Date Taking? Authorizing Provider  albuterol (VENTOLIN HFA) 108 (90 Base) MCG/ACT inhaler Inhale 1-2 puffs into the lungs every 6 (six) hours as needed for wheezing or shortness of breath. 05/29/21  Yes Tonia Ghent, MD  Calcium Carbonate-Vitamin D 600-400 MG-UNIT tablet Take 1 tablet by mouth two times a day   Yes [provider]  fluticasone (FLONASE) 50 MCG/ACT nasal spray Place 2 sprays into both nostrils daily. 05/29/21  Yes Tonia Ghent, MD  Fluticasone Propionate, Inhal, (FLOVENT DISKUS) 250 MCG/BLIST AEPB INHALE 2 PUFFS IN THE MORNING AND INHALE 2 PUFFS IN THE EVENING (RINSE AFTER USE) 05/29/21  Yes Tonia Ghent, MD  HYDROcodone-acetaminophen (NORCO) 7.5-325 MG tablet Take 1 tablet by mouth 3 (three) times daily as needed (fill on/after 05/19/22). 04/15/22  Yes Tonia Ghent, MD  lansoprazole (PREVACID) 15 MG capsule Take 1 capsule by mouth once daily 02/11/22  Yes Tonia Ghent, MD  levothyroxine (SYNTHROID) 25 MCG tablet Take 1 tablet (25 mcg total) by mouth daily. 01/01/22  Yes Tonia Ghent, MD  LORazepam (ATIVAN) 1 MG tablet TAKE 1/2 TABLET BY MOUTH TWICE DAILY AS NEEDED (SEDATION CAUTION) 12/12/21  Yes Tonia Ghent, MD  metoprolol succinate (TOPROL-XL) 100 MG 24 hr tablet TAKE 1 TABLET TWICE DAILY WITH OR IMMEDIATELY FOLLOWING A MEAL 11/21/21  Yes Tonia Ghent, MD  montelukast (SINGULAIR) 10 MG tablet TAKE 1 TABLET AT BEDTIME 11/21/21  Yes Tonia Ghent, MD  HYDROcodone-acetaminophen (NORCO) 7.5-325 MG tablet Take 1 tablet by mouth 3 (three) times daily as needed (for pain.). Fill on/after 06/18/22 04/15/22   Tonia Ghent, MD  HYDROcodone-acetaminophen (NORCO) 7.5-325 MG tablet Take 1 tablet by mouth 3 (three) times daily as needed (for pain. fill on/after 04/19/22). Patient not taking: Reported on 06/05/2022 04/15/22    Tonia Ghent, MD    Physical Exam: Vitals:   06/05/22 2200 06/05/22 2209 06/05/22 2310 06/06/22 0010  BP: (!) 225/120 (!) 211/103 (!) 174/98 (!) 173/92  Pulse:  96 100 99  Resp:  (!) 23 (!) 26 (!) 23  Temp:      TempSrc:      SpO2: 94% 94% 93% 100%  Weight:      Height:  Vitals:   06/05/22 2200 06/05/22 2209 06/05/22 2310 06/06/22 0010  BP: (!) 225/120 (!) 211/103 (!) 174/98 (!) 173/92  Pulse:  96 100 99  Resp:  (!) 23 (!) 26 (!) 23  Temp:      TempSrc:      SpO2: 94% 94% 93% 100%  Weight:      Height:      Constitutional: NAD, calm, comfortable Eyes: PERRL, lids and conjunctivae normal ENMT: Mucous membranes are moist. Posterior pharynx clear of any exudate or lesions.Normal dentition.  Neck: normal, supple, no masses, no thyromegaly Respiratory: clear to auscultation bilaterally, no wheezing, no crackles. Normal respiratory effort. No accessory muscle use.  Cardiovascular: Regular rate and rhythm, no murmurs / rubs / gallops. No extremity edema. 2+ pedal pulses.Abdomen: no tenderness, no masses palpated. No hepatosplenomegaly. Bowel sounds positive.  Musculoskeletal: no clubbing / cyanosis. No joint deformity upper and lower extremities. Good ROM, no contractures. Normal muscle tone.  Skin: no rashes, lesions, ulcers. No induration Neurologic: CN 2-12 grossly intact. Sensation intact, Strength 5/5 in all 4.  Psychiatric: Normal judgment and insight. Alert and oriented x 3. Normal mood.    Labs on Admission: I have personally reviewed following labs and imaging studies  CBC: Recent Labs  Lab 06/05/22 2200  WBC 6.2  NEUTROABS 3.1  HGB 14.7  HCT 47.1*  MCV 93.3  PLT 124   Basic Metabolic Panel: Recent Labs  Lab 06/05/22 2200  NA 140  K 3.9  CL 102  CO2 30  GLUCOSE 121*  BUN 20  CREATININE 1.07*  CALCIUM 9.4  MG 1.9   GFR: Estimated Creatinine Clearance: 30.3 mL/min (A) (by C-G formula based on SCr of 1.07 mg/dL (H)). Liver Function  Tests: Recent Labs  Lab 06/05/22 2200  AST 24  ALT 16  ALKPHOS 61  BILITOT 0.4  PROT 8.3*  ALBUMIN 4.2   No results for input(s): "LIPASE", "AMYLASE" in the last 168 hours. No results for input(s): "AMMONIA" in the last 168 hours. Coagulation Profile: No results for input(s): "INR", "PROTIME" in the last 168 hours. Cardiac Enzymes: No results for input(s): "CKTOTAL", "CKMB", "CKMBINDEX", "TROPONINI" in the last 168 hours. BNP (last 3 results) No results for input(s): "PROBNP" in the last 8760 hours. HbA1C: No results for input(s): "HGBA1C" in the last 72 hours. CBG: No results for input(s): "GLUCAP" in the last 168 hours. Lipid Profile: No results for input(s): "CHOL", "HDL", "LDLCALC", "TRIG", "CHOLHDL", "LDLDIRECT" in the last 72 hours. Thyroid Function Tests: Recent Labs    06/05/22 2200  TSH 2.173  FREET4 1.27*   Anemia Panel: No results for input(s): "VITAMINB12", "FOLATE", "FERRITIN", "TIBC", "IRON", "RETICCTPCT" in the last 72 hours. Urine analysis:    Component Value Date/Time   COLORURINE COLORLESS (A) 06/05/2022 2200   APPEARANCEUR CLEAR (A) 06/05/2022 2200   APPEARANCEUR Clear 04/17/2014 1450   LABSPEC 1.002 (L) 06/05/2022 2200   LABSPEC 1.013 04/17/2014 1450   PHURINE 7.0 06/05/2022 2200   GLUCOSEU NEGATIVE 06/05/2022 2200   GLUCOSEU NEGATIVE 05/26/2020 1718   HGBUR SMALL (A) 06/05/2022 2200   HGBUR negative 09/06/2010 1221   BILIRUBINUR NEGATIVE 06/05/2022 2200   BILIRUBINUR negative 10/06/2018 1326   BILIRUBINUR Negative 04/17/2014 1450   KETONESUR NEGATIVE 06/05/2022 2200   PROTEINUR NEGATIVE 06/05/2022 2200   UROBILINOGEN 0.2 05/26/2020 1718   NITRITE NEGATIVE 06/05/2022 2200   LEUKOCYTESUR MODERATE (A) 06/05/2022 2200   LEUKOCYTESUR Negative 04/17/2014 1450    Radiological Exams on Admission: CT Angio Chest PE W  and/or Wo Contrast  Result Date: 06/05/2022 CLINICAL DATA:  Concern for pulmonary embolism. EXAM: CT ANGIOGRAPHY CHEST WITH  CONTRAST TECHNIQUE: Multidetector CT imaging of the chest was performed using the standard protocol during bolus administration of intravenous contrast. Multiplanar CT image reconstructions and MIPs were obtained to evaluate the vascular anatomy. RADIATION DOSE REDUCTION: This exam was performed according to the departmental dose-optimization program which includes automated exposure control, adjustment of the mA and/or kV according to patient size and/or use of iterative reconstruction technique. CONTRAST:  76m OMNIPAQUE IOHEXOL 350 MG/ML SOLN COMPARISON:  Chest CT dated 04/17/2014 and radiograph dated 06/05/2022. FINDINGS: Evaluation of this exam is limited due to respiratory motion artifact. Cardiovascular: There is no cardiomegaly or pericardial effusion. There is coronary vascular calcification and calcification of the mitral annulus. Moderate atherosclerotic calcification of the thoracic aorta. No aneurysmal dilatation or dissection. Evaluation of the pulmonary arteries is limited due to respiratory motion and suboptimal visualization of the peripheral branches. No large or central pulmonary artery embolus identified. Mediastinum/Nodes: No hilar or mediastinal adenopathy. Evaluation however is limited due to respiratory motion and elevation of the left hemidiaphragm. There is a small hiatal hernia. The esophagus is grossly unremarkable. No mediastinal fluid collection. Lungs/Pleura: Significant elevation of the left hemidiaphragm with left lung base atelectasis. Pneumonia is less likely but not excluded clinical correlation is recommended. There is no pleural effusion or pneumothorax. The central airways are patent. Upper Abdomen: Cholecystectomy. Scattered calcified splenic granuloma. Musculoskeletal: Osteopenia with degenerative changes of the spine. No acute osseous pathology. Review of the MIP images confirms the above findings. IMPRESSION: 1. No CT evidence of central pulmonary artery embolus. 2.  Significant elevation of the left hemidiaphragm with left lung base atelectasis. 3. Aortic Atherosclerosis (ICD10-I70.0). Electronically Signed   By: AAnner CreteM.D.   On: 06/05/2022 23:53   DG Chest 2 View  Result Date: 06/05/2022 CLINICAL DATA:  sob EXAM: CHEST - 2 VIEW COMPARISON:  Chest x-ray 03/22/2017, CT chest 04/17/2014 FINDINGS: The heart and mediastinal contours are unchanged. Aortic calcification. Chronic calcified hilar mediastinal lymph nodes the lateral view. Marked elevation left hemidiaphragm. No base atelectasis. No focal consolidation. No pulmonary edema. No definite pleural effusion. No pneumothorax. No acute osseous abnormality. IMPRESSION: Markedly limited evaluation due to marked elevation of left hemidiaphragm. No active cardiopulmonary disease. Electronically Signed   By: MIven FinnM.D.   On: 06/05/2022 22:46   CT HEAD WO CONTRAST (5MM)  Result Date: 06/05/2022 CLINICAL DATA:  Severe headache EXAM: CT HEAD WITHOUT CONTRAST TECHNIQUE: Contiguous axial images were obtained from the base of the skull through the vertex without intravenous contrast. RADIATION DOSE REDUCTION: This exam was performed according to the departmental dose-optimization program which includes automated exposure control, adjustment of the mA and/or kV according to patient size and/or use of iterative reconstruction technique. COMPARISON:  None Available. FINDINGS: Brain: No acute territorial infarction, hemorrhage or intracranial mass. Atrophy and mild chronic small vessel ischemic changes of the white matter. Nonenlarged ventricles. Age indeterminate lacunar infarct within the left thalamus. Age indeterminate lacunar infarcts in the bilateral basal ganglia. Vascular: No hyperdense vessels. Scattered carotid vascular calcification Skull: Normal. Negative for fracture or focal lesion. Sinuses/Orbits: Moderate mucosal thickening in the sinuses Other: None IMPRESSION: 1. No CT evidence for acute  intracranial abnormality. 2. Age indeterminate lacunar infarcts within the left thalamus and bilateral basal ganglia 3. Atrophy and mild chronic small vessel ischemic changes of the white matter Electronically Signed   By: KMadie RenoD.  On: 06/05/2022 22:38    EKG: Independently reviewed. See above  Assessment/Plan  TIA r/o CVA -slurred speech and confusion intermittent x 2-3 days  - CT head negative for acute finding , neuro exam no focal neuro deficits  -on evaluation noted to have uncontrolled blood pressure -admit tia/cva r/o  -asa -mri pending  -continue with current secondary stroke ppx -neuro checks , SLP, PT/OT  -echo /carotids  per protocol    Uncontrolled HTN -s/p one dose of labetolol in ED -current asx  -will continue with permissive hypertension per protocol  -resume home regimen in am    UTI -ctx , f/u on culture data   Hx of Asthma  -resume home regimen  -mild sob  -ctpa negative -prn nebs   Anxiety  -resume anxiolytic   GERD -ppi   Depression   HLD Statin    Paroxysmal SVT -continue beta blocker   Chronic pain  -resume home regimen in am  -currently patient w/o acute concerns re chronic pain   DVT prophylaxis: scd/transition to heparin in am base on mri results Code Status: full Family Communication:  none at bedside Disposition Plan:  patient  expected to be admitted greater than 2 midnights  Consults called: Dr Leonel Ramsay Admission status: inpatient   Clance Boll MD [facility]  If 7PM-7AM, please contact night-coverage [email or web address] Password [email or web address]  06/06/2022, 1:05 AM

## 2022-06-06 NOTE — Progress Notes (Signed)
Patient had been complaining of vague pain.  See neurology note I came to reassess the patient in the evening as well.  She stated "I just cannot get comfortable" and son-in-law was concerned that pain started when she got up to use the bathroom and he is worried that her "kidneys might be shutting down."  Patient is unable to give detailed description of the pain, when I ask questions she is able to point to her epigastric region, she winces on palpation of this area, she states that it feels like a burning, she denies any stabbing pain, she denies nausea.  GI cocktail ordered.  Daughter had some concerns that this was not patient's usual home stomach medication "she is already on PPI equivalent of home medication and I advised the nurse to encourage the patient/family to try the GI cocktail.  If pain changes/persist, may need to consider CT abdomen or other work-up.

## 2022-06-06 NOTE — ED Notes (Signed)
Patient is resting comfortably. Family at bedside. No complaints or questions at this time.

## 2022-06-07 ENCOUNTER — Ambulatory Visit (INDEPENDENT_AMBULATORY_CARE_PROVIDER_SITE_OTHER): Payer: Medicare HMO

## 2022-06-07 ENCOUNTER — Telehealth: Payer: Self-pay | Admitting: *Deleted

## 2022-06-07 DIAGNOSIS — E785 Hyperlipidemia, unspecified: Secondary | ICD-10-CM | POA: Diagnosis not present

## 2022-06-07 DIAGNOSIS — I639 Cerebral infarction, unspecified: Secondary | ICD-10-CM | POA: Diagnosis not present

## 2022-06-07 DIAGNOSIS — I10 Essential (primary) hypertension: Secondary | ICD-10-CM

## 2022-06-07 DIAGNOSIS — G8929 Other chronic pain: Secondary | ICD-10-CM | POA: Diagnosis not present

## 2022-06-07 DIAGNOSIS — K649 Unspecified hemorrhoids: Secondary | ICD-10-CM | POA: Diagnosis not present

## 2022-06-07 DIAGNOSIS — E039 Hypothyroidism, unspecified: Secondary | ICD-10-CM

## 2022-06-07 DIAGNOSIS — I471 Supraventricular tachycardia: Secondary | ICD-10-CM

## 2022-06-07 LAB — BASIC METABOLIC PANEL
Anion gap: 11 (ref 5–15)
BUN: 12 mg/dL (ref 8–23)
CO2: 24 mmol/L (ref 22–32)
Calcium: 9.7 mg/dL (ref 8.9–10.3)
Chloride: 102 mmol/L (ref 98–111)
Creatinine, Ser: 1.03 mg/dL — ABNORMAL HIGH (ref 0.44–1.00)
GFR, Estimated: 53 mL/min — ABNORMAL LOW (ref >60)
Glucose, Bld: 114 mg/dL — ABNORMAL HIGH (ref 70–99)
Potassium: 3.4 mmol/L — ABNORMAL LOW (ref 3.5–5.1)
Sodium: 137 mmol/L (ref 135–145)

## 2022-06-07 LAB — URINE CULTURE: Culture: <10000 — AB

## 2022-06-07 LAB — CBC
HCT: 42.9 % (ref 36.0–46.0)
Hemoglobin: 14.1 g/dL (ref 12.0–15.0)
MCH: 29.4 pg (ref 26.0–34.0)
MCHC: 32.9 g/dL (ref 30.0–36.0)
MCV: 89.4 fL (ref 80.0–100.0)
Platelets: 235 10*3/uL (ref 150–400)
RBC: 4.8 MIL/uL (ref 3.87–5.11)
RDW: 12.5 % (ref 11.5–15.5)
WBC: 7.9 10*3/uL (ref 4.0–10.5)
nRBC: 0 % (ref 0.0–0.2)

## 2022-06-07 LAB — GLUCOSE, CAPILLARY: Glucose-Capillary: 124 mg/dL — ABNORMAL HIGH (ref 70–99)

## 2022-06-07 MED ORDER — CLOPIDOGREL BISULFATE 75 MG PO TABS: 75.0000 mg | ORAL_TABLET | Freq: Every day | ORAL | 0 refills | Status: DC

## 2022-06-07 MED ORDER — HALOPERIDOL LACTATE 5 MG/ML IJ SOLN
1.0000 mg | Freq: Once | INTRAMUSCULAR | Status: AC
  Administered 2022-06-07: 1 mg via INTRAVENOUS
  Filled 2022-06-07: qty 1

## 2022-06-07 MED ORDER — TRAZODONE HCL 50 MG PO TABS
25.0000 mg | ORAL_TABLET | Freq: Once | ORAL | Status: DC
  Filled 2022-06-07: qty 1

## 2022-06-07 MED ORDER — HYDROCODONE-ACETAMINOPHEN 7.5-325 MG PO TABS: 1.0000 | ORAL_TABLET | Freq: Three times a day (TID) | ORAL | Status: DC | PRN

## 2022-06-07 MED ORDER — TRAZODONE HCL 50 MG PO TABS
25.0000 mg | ORAL_TABLET | Freq: Every day | ORAL | Status: DC
Start: 2022-06-07 — End: 2022-06-07

## 2022-06-07 MED ORDER — ASPIRIN 81 MG PO TBEC: 81.0000 mg | DELAYED_RELEASE_TABLET | Freq: Every day | ORAL | 0 refills | Status: DC

## 2022-06-07 NOTE — Care Management (Signed)
Pre-rounds chart review and preliminary plan / goals for today: Significant overnight events per chart: agitation needing Halddol Significant new findings on labs/other diagnostics: BP elevated/low alternating,  Dispo plan: PT still needs to assess, OT recommends HH Pending/Plan - hopefully can discharge today:  TOC to arrange [facility] for OT and likely will need PT as well AM labs Neurology clearance        Please feel free to reach out via secure chat in Epic for non-urgent issues. Please page for urgent matters!  This note will be updated after rounds to full progress note / discharge note.

## 2022-06-07 NOTE — Evaluation (Signed)
Physical Therapy Evaluation Patient Details Name: Jillian Oconnell MRN: [id number] DOB: 12/25/1933 Today's Date: 06/07/2022  History of Present Illness  88 y/o F who presents w/ CVA. PMH: Asthma, anxiety, gerd, depression , HTN ,DMII, HLD , paroxysmal SVT on metoprolol bid, and chronic pain.  Clinical Impression  Pt feeling much better this morning, in good spirits and eager to go home.  Ultimately she did quite well with mobility and prolonged bout of ambulation.  She was able to circumambulate the nurses' station, reports feeling close to her baseline (and was able to do ~100 ft w/o AD) but not quite there.  She will benefit from HHPT to address balance, activity tolerance, ambulation and strength changes.  Pt is safe to return home, has 24/7 supervision as needed.       Recommendations for follow up therapy are one component of a multi-disciplinary discharge planning process, led by the attending physician.  Recommendations may be updated based on patient status, additional functional criteria and insurance authorization.  Follow Up Recommendations Home health PT      Assistance Recommended at Discharge PRN  Patient can return home with the following  Assistance with cooking/housework;Assist for transportation;A little help with bathing/dressing/bathroom    Equipment Recommendations    Recommendations for Other Services       Functional Status Assessment Patient has had a recent decline in their functional status and demonstrates the ability to make significant improvements in function in a reasonable and predictable amount of time.     Precautions / Restrictions Precautions Precautions: Fall Restrictions Weight Bearing Restrictions: No      Mobility  Bed Mobility               General bed mobility comments: in recliner on arrival    Transfers Overall transfer level: Needs assistance Equipment used: None Transfers: Sit to/from Stand Sit to Stand: Modified  independent (Device/Increase time)           General transfer comment: Pt was able to rise to standing and maintain balance w/o UE support    Ambulation/Gait Ambulation/Gait assistance: Supervision Gait Distance (Feet): 200 Feet Assistive device: Rolling walker (2 wheels), 1 person hand held assist, None         General Gait Details: Pt was able to ambulate with consistent and confident cadence.  She reports she is close to baseline, HR did rise to the 120s with the effort.  ~50 ft with walker, ~50 ft with single UE support (HHA and rail) and ~100 ft w/o UE/AD.  Stairs            Wheelchair Mobility    Modified Rankin (Stroke Patients Only)       Balance Overall balance assessment: Needs assistance   Sitting balance-Leahy Scale: Normal     Standing balance support: During functional activity, No upper extremity supported Standing balance-Leahy Scale: Good Standing balance comment: Pt with appropriate use of UEs with no LOBs, unsteadiness or overt safety issues                             Pertinent Vitals/Pain Pain Assessment Pain Assessment: No/denies pain    Home Living Family/patient expects to be discharged to:: Private residence Living Arrangements: Children;Other (Comment) Available Help at Discharge: Family;Available 24 hours/day Type of Home: House Home Access: Ramped entrance         Home Equipment: Rollator (4 wheels);Cane - single point;BSC/3in1      Prior  Function Prior Level of Function : Needs assist             Mobility Comments: able to get out of the home with family occasiojnally, typically will not need an AD but can/does use them when she feels the need ADLs Comments: Pt reports daughter sometimes helps with dressing and bathing.     Hand Dominance        Extremity/Trunk Assessment   Upper Extremity Assessment Upper Extremity Assessment: Generalized weakness;Overall [facility] for tasks assessed    Lower  Extremity Assessment Lower Extremity Assessment: Overall WFL for tasks assessed;Generalized weakness       Communication   Communication: No difficulties  Cognition Arousal/Alertness: Awake/alert Behavior During Therapy: WFL for tasks assessed/performed Overall Cognitive Status: Within Functional Limits for tasks assessed                                          General Comments General comments (skin integrity, edema, etc.): Pt reports feeling pretty close to baseline, eager to go home    Exercises     Assessment/Plan    PT Assessment Patient needs continued PT services  PT Problem List Decreased activity tolerance;Decreased balance;Decreased strength       PT Treatment Interventions Gait training;Functional mobility training;Therapeutic activities;Therapeutic exercise;Balance training;Neuromuscular re-education;Patient/family education    PT Goals (Current goals can be found in the Care Plan section)  Acute Rehab PT Goals Patient Stated Goal: go home today PT Goal Formulation: With patient Time For Goal Achievement: 06/20/22 Potential to Achieve Goals: Good    Frequency Min 2X/week     Co-evaluation               AM-PAC PT "6 Clicks" Mobility  Outcome Measure Help needed turning from your back to your side while in a flat bed without using bedrails?: None Help needed moving from lying on your back to sitting on the side of a flat bed without using bedrails?: None Help needed moving to and from a bed to a chair (including a wheelchair)?: None Help needed standing up from a chair using your arms (e.g., wheelchair or bedside chair)?: None Help needed to walk in hospital room?: A Little Help needed climbing 3-5 steps with a railing? : A Little 6 Click Score: 22    End of Session Equipment Utilized During Treatment: Gait belt Activity Tolerance: Patient tolerated treatment well Patient left: in chair;with call bell/phone within reach;with  nursing/sitter in room;with family/visitor present Nurse Communication: Mobility status PT Visit Diagnosis: Muscle weakness (generalized) (M62.81);Difficulty in walking, not elsewhere classified (R26.2)    Time: 1093-2355 PT Time Calculation (min) (ACUTE ONLY): 24 min   Charges:   PT Evaluation $PT Eval Low Complexity: 1 Low PT Treatments $Gait Training: 8-22 mins        Kreg Shropshire, DPT 06/07/2022, 9:35 AM

## 2022-06-07 NOTE — Telephone Encounter (Signed)
Called and spoke with pt's daughter, [name] ([place]).  Reviewed instructions for 14 day zio monitor.  [name] voiced understanding.  Orders placed.  Pt's home address confirmed.   Forwarded to scheduling re 2 month follow up.  [name] aware will receive a call to schedule follow up.  No further questions at this time.

## 2022-06-07 NOTE — Telephone Encounter (Signed)
-----   Message from [place], PA-C sent at 06/07/2022  8:39 AM EDT ----- Regarding: heart monitor Pt needs heart monitor 2 week  to look for afib, he recently had a stroke. Can we mail this out please. And set up follow-up in 2 months. thanks

## 2022-06-07 NOTE — Progress Notes (Signed)
[employer] - Nursing reports patient with extreme agitation and difficult to redirect with family at bedside.  Also reports had more agitated response after lorazepam the night before last  Action - Attempted to treat with trazadone but patient refused dose.  1 mg IV haldol ordered. Review of home meds shows chronic opioid therapy for chromic back pain. May consider opioid med if pain behaviores accompany delirium.  Response moderately effective for symptom relief

## 2022-06-07 NOTE — Discharge Summary (Signed)
Physician Discharge Summary   Patient: Jillian Oconnell MRN: [id number]  DOB: 1934/06/26   Admit:     Date of Admission: 06/05/2022 Admitted from: home   Discharge: Date of discharge: 06/07/22 Disposition:  home health, lives w/ family Condition at discharge: good  CODE STATUS: DNR   Diet recommendation: Cardiac diet   Discharge Physician: Emeterio Reeve, DO [facility]     PCP: Tonia Ghent, MD  Recommendations for Outpatient Follow-up:  Follow up with PCP Tonia Ghent, MD in 1-2 weeks Please obtain labs/tests: CBC, BMP, UA/Culture in 1-2 weeks Please follow up on the following pending results: Zio monitor results PCP please encourage patient to start statin or PCSK9  Follow w/ neurology in 1 month   Printed for patient on AVS:  Aspirin + Plavix for 3 weeks, then just Plavix indefinitely after that.  Zio patch and follow up with your cardiology office on these results Follow w/ your PCP to recheck urine if needed.  Follow w/ neurology in 1 month.     Brief/Interim Summary: Brought to ED concern for slurred speech which had resolved. Imaging (+)CVA, neurology consulted. See results below for MRI/MRA, Echo, Pt remained stable. UA abn but cultures insignificant growth and no symptoms, stopped abx. Pt and daughter feels she is ready for discharge, I messaged neurology via secure chat and only additional recommendation was for Zio patch, will work on arranging this and confirming [place] in place.   Consultants:  Neurology  Procedures:  none      Discharge Diagnoses: Principal Problem:   CVA (cerebral vascular accident) ([place]) Active Problems:   HLD (hyperlipidemia)   HTN (hypertension)   Paroxysmal supraventricular tachycardia (HCC)   Hemorrhoids   Hypothyroidism   Urinary tract infection   Chronic pain on prescribed opiates     Assessment & Plan: CVA (cerebral vascular accident) ([place]) Slurred speech and confusion intermittent x 2-3 days.   Concern for HTN urgency/emergency as well, see below CT head in ED negative for acute finding  On admission per H&P, neuro exam no focal neuro deficits  06/06/22 MRI brain, MRA head/neck: "1. 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 2. MRA head is limited by motion. Within this limitation, no intracranial large vessel occlusion or severe stenosis. 3. No hemodynamically significant stenosis in the neck." Uncontrolled blood pressure --> permissive HTN initially Received Aspirin Secondary prevention: ASA + Plavix neuro checks, SLP, PT/OT  Echo: EF 00-51, no cardio-embolic concerns  Neurology consult - see note, following. Appreciate recommendations re: discharge readiness  ASA + Plavix Lipid control - low potency statin vs PCSK9    Uncontrolled HTN -s/p one dose of labetolol in ED -current asx  -will continue with permissive hypertension per protocol  -resume home regimen on dischage    Abn UA, no UTI -f/u w/ PCP prn   Hx of Asthma  -resume home regimen   Anxiety  -resume anxiolytic    GERD -ppi  Refused GI cocktail for acute indigestion   Depression    HLD Statin    Paroxysmal SVT -continue beta blocker    Chronic pain  -resume home regimen      Discharge Instructions    Allergies as of 06/07/2022       Reactions   Albuterol    REACTION: jittery   Alendronate Sodium    REACTION: GI side eff   Amoxicillin-pot Clavulanate    REACTION: non tolerant- but can take plain amox  Atorvastatin Nausea And Vomiting   Azithromycin    GI upset.  Not an allergy.   Ezetimibe-simvastatin    REACTION: myalgia   Gabapentin    REACTION: tremor   Ibandronate Sodium    REACTION: GI side eff   Lexapro [escitalopram Oxalate]    sedation   Lyrica [pregabalin] Other (See Comments)   heart racing and didn't help pain.    Neosporin [bacitracin-polymyxin B]    Irritation locally   Risedronate Sodium    REACTION: GI side eff   Rosuvastatin     REACTION: myalgia   Sertraline    sedation   Sulfonamide Derivatives    REACTION: not tolerate        Medication List     TAKE these medications    albuterol 108 (90 Base) MCG/ACT inhaler Commonly known as: VENTOLIN HFA Inhale 1-2 puffs into the lungs every 6 (six) hours as needed for wheezing or shortness of breath.   aspirin EC 81 MG tablet Take 1 tablet (81 mg total) by mouth daily. Swallow whole.   Calcium Carbonate-Vitamin D 600-400 MG-UNIT tablet Take 1 tablet by mouth two times a day   clopidogrel 75 MG tablet Commonly known as: PLAVIX Take 1 tablet (75 mg total) by mouth daily.   Flovent Diskus 250 MCG/ACT Aepb Generic drug: Fluticasone Propionate (Inhal) INHALE 2 PUFFS IN THE MORNING AND INHALE 2 PUFFS IN THE EVENING (RINSE AFTER USE)   fluticasone 50 MCG/ACT nasal spray Commonly known as: Flonase Place 2 sprays into both nostrils daily.   HYDROcodone-acetaminophen 7.5-325 MG tablet Commonly known as: NORCO Take 1 tablet by mouth 3 (three) times daily as needed (for pain.). Fill on/after 06/18/22   HYDROcodone-acetaminophen 7.5-325 MG tablet Commonly known as: NORCO Take 1 tablet by mouth 3 (three) times daily as needed (fill on/after 05/19/22).   HYDROcodone-acetaminophen 7.5-325 MG tablet Commonly known as: NORCO Take 1 tablet by mouth 3 (three) times daily as needed (for pain. fill on/after 04/19/22).   lansoprazole 15 MG capsule Commonly known as: PREVACID Take 1 capsule by mouth once daily   levothyroxine 25 MCG tablet Commonly known as: SYNTHROID Take 1 tablet (25 mcg total) by mouth daily.   LORazepam 1 MG tablet Commonly known as: ATIVAN TAKE 1/2 TABLET BY MOUTH TWICE DAILY AS NEEDED (SEDATION CAUTION)   metoprolol succinate 100 MG 24 hr tablet Commonly known as: TOPROL-XL TAKE 1 TABLET TWICE DAILY WITH OR IMMEDIATELY FOLLOWING A MEAL   montelukast 10 MG tablet Commonly known as: SINGULAIR TAKE 1 TABLET AT BEDTIME        Diet Orders  (From admission, onward)     Start     Ordered   06/06/22 1640  Diet Heart Room service appropriate? Yes; Fluid consistency: Thin  Diet effective now       Comments: Please send sweet things as much as possible per pt request like Pancakes or [place] toast w/ 3 syrups at breakfast meals.  Puddings, yogurt.  Question Answer Comment  Room service appropriate? Yes   Fluid consistency: Thin      06/06/22 1644               Allergies  Allergen Reactions   Albuterol     REACTION: jittery   Alendronate Sodium     REACTION: GI side eff   Amoxicillin-Pot Clavulanate     REACTION: non tolerant- but can take plain amox   Atorvastatin Nausea And Vomiting   Azithromycin     GI upset.  Not an allergy.   Ezetimibe-Simvastatin     REACTION: myalgia   Gabapentin     REACTION: tremor   Ibandronate Sodium     REACTION: GI side eff   Lexapro [Escitalopram Oxalate]     sedation   Lyrica [Pregabalin] Other (See Comments)    heart racing and didn't help pain.    Neosporin [Bacitracin-Polymyxin B]     Irritation locally   Risedronate Sodium     REACTION: GI side eff   Rosuvastatin     REACTION: myalgia   Sertraline     sedation   Sulfonamide Derivatives     REACTION: not tolerate     Subjective: Pt dfeeling well today, daughter is in the room and both feel ready for discharge home.    Discharge Exam: Vitals:   06/07/22 0748 06/07/22 0817  BP:  (!) 124/93  Pulse: (!) 101 (!) 108  Resp: 16 16  Temp:  98.6 F (37 C)  SpO2: 96% 97%   Vitals:   06/07/22 0118 06/07/22 0337 06/07/22 0748 06/07/22 0817  BP: (!) 184/78 (!) 174/81  (!) 124/93  Pulse: 96 94 (!) 101 (!) 108  Resp: '18 18 16 16  '$ Temp: 97.7 F (36.5 C) 97.7 F (36.5 C)  98.6 F (37 C)  TempSrc:      SpO2: 100% 97% 96% 97%  Weight:      Height:         General: Pt is alert, awake, not in acute distress Cardiovascular: RRR, S1/S2 +, no rubs, no gallops Respiratory: CTA bilaterally, no wheezing, no  rhonchi Abdominal: Soft, NT, ND, bowel sounds + Extremities: no edema, no cyanosis     The results of significant diagnostics from this hospitalization (including imaging, microbiology, ancillary and laboratory) are listed below for reference.     Microbiology: Recent Results (from the past 240 hour(s))  Urine Culture     Status: Abnormal   Collection Time: 06/05/22 10:00 PM   Specimen: Urine, Random  Result Value Ref Range Status   Specimen Description   Final    URINE, RANDOM Performed at [facility], [address] [place], [place] [address]    Special Requests   Final    NONE Performed at [facility], [address] [place], [place] [address]    Culture (A)  Final    <10,000 COLONIES/mL INSIGNIFICANT GROWTH Performed at [facility] [address], [place], [place] [address]    Report Status 06/07/2022 FINAL  Final  SARS Coronavirus 2 by RT PCR (hospital order, performed in [facility] hospital lab) *cepheid single result test* Anterior Nasal Swab     Status: None   Collection Time: 06/05/22 11:48 PM   Specimen: Anterior Nasal Swab  Result Value Ref Range Status   SARS Coronavirus 2 by RT PCR NEGATIVE NEGATIVE Final    Comment: (NOTE) SARS-CoV-2 target nucleic acids are NOT DETECTED.  The SARS-CoV-2 RNA is generally detectable in upper and lower respiratory specimens during the acute phase of infection. The lowest concentration of SARS-CoV-2 viral copies this assay can detect is 250 copies / mL. A negative result does not preclude SARS-CoV-2 infection and should not be used as the sole basis for treatment or other patient management decisions.  A negative result may occur with improper specimen collection / handling, submission of specimen other than nasopharyngeal swab, presence of viral mutation(s) within the areas targeted by this assay, and inadequate number of viral copies (<250 copies /  mL). A negative result must be combined with  clinical observations, patient history, and epidemiological information.  Fact Sheet for Patients:   https://www.patel.info/  Fact Sheet for Healthcare Providers: https://hall.com/  This test is not yet approved or  cleared by the [place] FDA and has been authorized for detection and/or diagnosis of SARS-CoV-2 by FDA under an Emergency Use Authorization (EUA).  This EUA will remain in effect (meaning this test can be used) for the duration of the COVID-19 declaration under Section 564(b)(1) of the Act, 21 U.S.C. section 360bbb-3(b)(1), unless the authorization is terminated or revoked sooner.  Performed at [facility], [address] [place], [place] [address]      Labs: BNP (last 3 results) Recent Labs    06/05/22 2200  BNP 703.5*   Basic Metabolic Panel: Recent Labs  Lab 06/05/22 2200 06/07/22 0739  NA 140 137  K 3.9 3.4*  CL 102 102  CO2 30 24  GLUCOSE 121* 114*  BUN 20 12  CREATININE 1.07* 1.03*  CALCIUM 9.4 9.7  MG 1.9  --    Liver Function Tests: Recent Labs  Lab 06/05/22 2200  AST 24  ALT 16  ALKPHOS 61  BILITOT 0.4  PROT 8.3*  ALBUMIN 4.2   No results for input(s): "LIPASE", "AMYLASE" in the last 168 hours. No results for input(s): "AMMONIA" in the last 168 hours. CBC: Recent Labs  Lab 06/05/22 2200 06/07/22 0739  WBC 6.2 7.9  NEUTROABS 3.1  --   HGB 14.7 14.1  HCT 47.1* 42.9  MCV 93.3 89.4  PLT 259 235   Cardiac Enzymes: No results for input(s): "CKTOTAL", "CKMB", "CKMBINDEX", "TROPONINI" in the last 168 hours. BNP: Invalid input(s): "POCBNP" CBG: Recent Labs  Lab 06/06/22 1458 06/06/22 1939 06/07/22 0817  GLUCAP 112* 122* 124*   D-Dimer No results for input(s): "DDIMER" in the last 72 hours. Hgb A1c Recent Labs    06/06/22 0427  HGBA1C 5.5   Lipid Profile Recent Labs    06/06/22 0427  CHOL 166  HDL 64  LDLCALC 90  TRIG 60  CHOLHDL 2.6   Thyroid  function studies Recent Labs    06/05/22 2200  TSH 2.173   Anemia work up No results for input(s): "VITAMINB12", "FOLATE", "FERRITIN", "TIBC", "IRON", "RETICCTPCT" in the last 72 hours. Urinalysis    Component Value Date/Time   COLORURINE COLORLESS (A) 06/05/2022 2200   APPEARANCEUR CLEAR (A) 06/05/2022 2200   APPEARANCEUR Clear 04/17/2014 1450   LABSPEC 1.002 (L) 06/05/2022 2200   LABSPEC 1.013 04/17/2014 1450   PHURINE 7.0 06/05/2022 2200   GLUCOSEU NEGATIVE 06/05/2022 2200   GLUCOSEU NEGATIVE 05/26/2020 1718   HGBUR SMALL (A) 06/05/2022 2200   HGBUR negative 09/06/2010 1221   BILIRUBINUR NEGATIVE 06/05/2022 2200   BILIRUBINUR negative 10/06/2018 1326   BILIRUBINUR Negative 04/17/2014 1450   KETONESUR NEGATIVE 06/05/2022 2200   PROTEINUR NEGATIVE 06/05/2022 2200   UROBILINOGEN 0.2 05/26/2020 1718   NITRITE NEGATIVE 06/05/2022 2200   LEUKOCYTESUR MODERATE (A) 06/05/2022 2200   LEUKOCYTESUR Negative 04/17/2014 1450   Sepsis Labs Recent Labs  Lab 06/05/22 2200 06/07/22 0739  WBC 6.2 7.9   Microbiology Recent Results (from the past 240 hour(s))  Urine Culture     Status: Abnormal   Collection Time: 06/05/22 10:00 PM   Specimen: Urine, Random  Result Value Ref Range Status   Specimen Description   Final    URINE, RANDOM Performed at [facility], [address] [place], [place] [address]  Special Requests   Final    NONE Performed at [facility], [address] [place], [place] [address]    Culture (A)  Final    <10,000 COLONIES/mL INSIGNIFICANT GROWTH Performed at [facility] [address], [place], [place] [address]    Report Status 06/07/2022 FINAL  Final  SARS Coronavirus 2 by RT PCR (hospital order, performed in [facility] hospital lab) *cepheid single result test* Anterior Nasal Swab     Status: None   Collection Time: 06/05/22 11:48 PM   Specimen: Anterior Nasal Swab  Result Value Ref Range Status   SARS Coronavirus 2  by RT PCR NEGATIVE NEGATIVE Final    Comment: (NOTE) SARS-CoV-2 target nucleic acids are NOT DETECTED.  The SARS-CoV-2 RNA is generally detectable in upper and lower respiratory specimens during the acute phase of infection. The lowest concentration of SARS-CoV-2 viral copies this assay can detect is 250 copies / mL. A negative result does not preclude SARS-CoV-2 infection and should not be used as the sole basis for treatment or other patient management decisions.  A negative result may occur with improper specimen collection / handling, submission of specimen other than nasopharyngeal swab, presence of viral mutation(s) within the areas targeted by this assay, and inadequate number of viral copies (<250 copies / mL). A negative result must be combined with clinical observations, patient history, and epidemiological information.  Fact Sheet for Patients:   https://www.patel.info/  Fact Sheet for Healthcare Providers: https://hall.com/  This test is not yet approved or  cleared by the [place] FDA and has been authorized for detection and/or diagnosis of SARS-CoV-2 by FDA under an Emergency Use Authorization (EUA).  This EUA will remain in effect (meaning this test can be used) for the duration of the COVID-19 declaration under Section 564(b)(1) of the Act, 21 U.S.C. section 360bbb-3(b)(1), unless the authorization is terminated or revoked sooner.  Performed at [facility], [address] [place], [place] [address]    Imaging ECHOCARDIOGRAM COMPLETE  Result Date: 06/06/2022    ECHOCARDIOGRAM REPORT   Patient Name:   Jillian Oconnell Date of Exam: 06/06/2022 Medical Rec #:  [id number]    Height:       58.0 in Accession #:    [id number]   Weight:       140.0 lb Date of Birth:  [birth year]   BSA:          1.565 m Patient Age:    88 years     BP:           149/131 mmHg Patient Gender: F            HR:           96 bpm. Exam  Location:  [facility] Procedure: 2D Echo, Cardiac Doppler and Color Doppler Indications:     TIA G45.9  History:         Patient has prior history of Echocardiogram examinations, most                  recent 07/05/2020. Risk Factors:Hypertension and Dyslipidemia.  Sonographer:     Sherrie Sport Referring Phys:  [id number] SARA-MAIZ A THOMAS Diagnosing Phys: Kate Sable MD  Sonographer Comments: Technically difficult study due to poor echo windows and no apical window. Image acquisition challenging due to patient body habitus and Pt heart is dextracardia. IMPRESSIONS  1. Left ventricular ejection fraction, by estimation, is 60 to 65%. The left ventricle has normal function.  The left ventricle has no regional wall motion abnormalities. Left ventricular diastolic function could not be evaluated.  2. Right ventricular systolic function is normal. The right ventricular size is not well visualized.  3. The mitral valve is degenerative. No evidence of mitral valve regurgitation. Severe mitral annular calcification.  4. The aortic valve is calcified. Aortic valve regurgitation is not visualized. Aortic valve sclerosis/calcification is present, without any evidence of aortic stenosis.  5. The inferior vena cava is normal in size with greater than 50% respiratory variability, suggesting right atrial pressure of 3 mmHg. FINDINGS  Left Ventricle: Left ventricular ejection fraction, by estimation, is 60 to 65%. The left ventricle has normal function. The left ventricle has no regional wall motion abnormalities. The left ventricular internal cavity size was normal in size. There is  no left ventricular hypertrophy. Left ventricular diastolic function could not be evaluated. Right Ventricle: The right ventricular size is not well visualized. No increase in right ventricular wall thickness. Right ventricular systolic function is normal. Left Atrium: Left atrial size was not well visualized. Right Atrium: Right atrial size was not well  visualized. Pericardium: There is no evidence of pericardial effusion. Mitral Valve: The mitral valve is degenerative in appearance. Severe mitral annular calcification. No evidence of mitral valve regurgitation. Tricuspid Valve: The tricuspid valve is grossly normal. Tricuspid valve regurgitation is not demonstrated. Aortic Valve: The aortic valve is calcified. Aortic valve regurgitation is not visualized. Aortic valve sclerosis/calcification is present, without any evidence of aortic stenosis. Pulmonic Valve: The pulmonic valve was not well visualized. Pulmonic valve regurgitation is not visualized. Aorta: The aortic root is normal in size and structure. Venous: The inferior vena cava is normal in size with greater than 50% respiratory variability, suggesting right atrial pressure of 3 mmHg. IAS/Shunts: The interatrial septum was not assessed.  LEFT VENTRICLE PLAX 2D LVIDd:         3.30 cm LVIDs:         2.00 cm LV PW:         0.90 cm LV IVS:        1.10 cm LVOT diam:     2.00 cm LVOT Area:     3.14 cm  LEFT ATRIUM         Index LA diam:    3.40 cm 2.17 cm/m   AORTA Ao Root diam: 2.85 cm TRICUSPID VALVE TR Peak grad:   28.5 mmHg TR Vmax:        267.00 cm/s  SHUNTS Systemic Diam: 2.00 cm Kate Sable MD Electronically signed by Kate Sable MD Signature Date/Time: 06/06/2022/1:55:49 PM    Final    MR BRAIN WO CONTRAST  Result Date: 06/06/2022 CLINICAL DATA:  INCREASED CONFUSION, HYPERTENSION HEADACHE EXAM: MRI HEAD WITHOUT CONTRAST MRA HEAD WITHOUT CONTRAST MRA NECK WITHOUT AND WITH CONTRAST TECHNIQUE: Multiplanar, multi-echo pulse sequences of the brain and surrounding structures were acquired without intravenous contrast. Angiographic images of the Circle of Willis were acquired using MRA technique without intravenous contrast. Angiographic images of the neck were acquired using MRA technique without and with intravenous contrast. Carotid stenosis measurements (when applicable) are obtained  utilizing NASCET criteria, using the distal internal carotid diameter as the denominator. CONTRAST:  32m GADAVIST GADOBUTROL 1 MMOL/ML IV SOLN COMPARISON:  CT HEAD 06/05/2022 FINDINGS: MRI HEAD FINDINGS Brain: Restricted diffusion with ADC correlate in the right caudate head (series 5, image 5 and series 7, image 23) and in the right lentiform nucleus (series 5, image 25 and series 7,  image 21). No acute hemorrhage, mass, mass effect, or midline shift. No hydrocephalus or extra-axial collection. No hemosiderin deposition to suggest remote hemorrhage. T2 hyperintense signal in the periventricular white matter and pons, likely the sequela of mild-to-moderate chronic small vessel ischemic disease. Cerebral volume is within normal limits for age. Vascular: Normal arterial flow voids. Skull and upper cervical spine: Normal marrow signal. Sinuses/Orbits: Mucosal thickening in the ethmoid air cells. Air-fluid level in the left sphenoid sinus. Mild mucosal thickening in the maxillary sinuses. Status post bilateral lens replacements. Other: The mastoids are well aerated. MRA HEAD FINDINGS Evaluation is somewhat limited by motion artifact. Anterior circulation: Both internal carotid arteries are patent to the termini, with mild narrowing of the proximal supraclinoid ICAs. A1 segments patent. Poor visualization of the bilateral A2 segments, which is favored to be artifactual. Anterior cerebral arteries are otherwise patent to their distal aspects. No M1 stenosis or occlusion. Normal MCA bifurcations. Distal MCA branches perfused but intermittently poorly visualized, likely secondary to motion. Posterior circulation: Vertebral arteries patent to the vertebrobasilar junction. Basilar patent to its distal aspect. Superior cerebellar arteries patent bilaterally. Patent P1 segments. PCAs perfused to their distal aspects without stenosis. The bilateral posterior communicating arteries are not visualized. Anatomic variants: None  significant MRA NECK FINDINGS Aortic arch: Standard aortic branching. Imaged portion shows no evidence of aneurysm or dissection. No significant stenosis of the major arch vessel origins. Right carotid system: No evidence of dissection, occlusion, or hemodynamically significant stenosis (greater than 50%). Left carotid system: No evidence of dissection, occlusion, or hemodynamically significant stenosis (greater than 50%). Vertebral arteries: No evidence of dissection, occlusion, or hemodynamically significant stenosis (greater than 50%). Other: None IMPRESSION: 1. 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 2. MRA head is limited by motion. Within this limitation, no intracranial large vessel occlusion or severe stenosis. 3. No hemodynamically significant stenosis in the neck. 4. Air-fluid level in the left sphenoid sinus, as can be seen with acute sinusitis. Correlate with symptoms. These results were called by telephone at the time of interpretation on 06/06/2022 at 2:12 am to provider KATY FOUST, who verbally acknowledged these results. Electronically Signed   By: Merilyn Baba M.D.   On: 06/06/2022 02:34   MR ANGIO HEAD WO CONTRAST  Result Date: 06/06/2022 CLINICAL DATA:  INCREASED CONFUSION, HYPERTENSION HEADACHE EXAM: MRI HEAD WITHOUT CONTRAST MRA HEAD WITHOUT CONTRAST MRA NECK WITHOUT AND WITH CONTRAST TECHNIQUE: Multiplanar, multi-echo pulse sequences of the brain and surrounding structures were acquired without intravenous contrast. Angiographic images of the Circle of Willis were acquired using MRA technique without intravenous contrast. Angiographic images of the neck were acquired using MRA technique without and with intravenous contrast. Carotid stenosis measurements (when applicable) are obtained utilizing NASCET criteria, using the distal internal carotid diameter as the denominator. CONTRAST:  62m GADAVIST GADOBUTROL 1 MMOL/ML IV SOLN COMPARISON:  CT HEAD 06/05/2022 FINDINGS:  MRI HEAD FINDINGS Brain: Restricted diffusion with ADC correlate in the right caudate head (series 5, image 5 and series 7, image 23) and in the right lentiform nucleus (series 5, image 25 and series 7, image 21). No acute hemorrhage, mass, mass effect, or midline shift. No hydrocephalus or extra-axial collection. No hemosiderin deposition to suggest remote hemorrhage. T2 hyperintense signal in the periventricular white matter and pons, likely the sequela of mild-to-moderate chronic small vessel ischemic disease. Cerebral volume is within normal limits for age. Vascular: Normal arterial flow voids. Skull and upper cervical spine: Normal marrow signal. Sinuses/Orbits: Mucosal thickening  in the ethmoid air cells. Air-fluid level in the left sphenoid sinus. Mild mucosal thickening in the maxillary sinuses. Status post bilateral lens replacements. Other: The mastoids are well aerated. MRA HEAD FINDINGS Evaluation is somewhat limited by motion artifact. Anterior circulation: Both internal carotid arteries are patent to the termini, with mild narrowing of the proximal supraclinoid ICAs. A1 segments patent. Poor visualization of the bilateral A2 segments, which is favored to be artifactual. Anterior cerebral arteries are otherwise patent to their distal aspects. No M1 stenosis or occlusion. Normal MCA bifurcations. Distal MCA branches perfused but intermittently poorly visualized, likely secondary to motion. Posterior circulation: Vertebral arteries patent to the vertebrobasilar junction. Basilar patent to its distal aspect. Superior cerebellar arteries patent bilaterally. Patent P1 segments. PCAs perfused to their distal aspects without stenosis. The bilateral posterior communicating arteries are not visualized. Anatomic variants: None significant MRA NECK FINDINGS Aortic arch: Standard aortic branching. Imaged portion shows no evidence of aneurysm or dissection. No significant stenosis of the major arch vessel origins.  Right carotid system: No evidence of dissection, occlusion, or hemodynamically significant stenosis (greater than 50%). Left carotid system: No evidence of dissection, occlusion, or hemodynamically significant stenosis (greater than 50%). Vertebral arteries: No evidence of dissection, occlusion, or hemodynamically significant stenosis (greater than 50%). Other: None IMPRESSION: 1. 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 2. MRA head is limited by motion. Within this limitation, no intracranial large vessel occlusion or severe stenosis. 3. No hemodynamically significant stenosis in the neck. 4. Air-fluid level in the left sphenoid sinus, as can be seen with acute sinusitis. Correlate with symptoms. These results were called by telephone at the time of interpretation on 06/06/2022 at 2:12 am to provider KATY FOUST, who verbally acknowledged these results. Electronically Signed   By: Merilyn Baba M.D.   On: 06/06/2022 02:34   MR Angiogram Neck W or Wo Contrast  Result Date: 06/06/2022 CLINICAL DATA:  INCREASED CONFUSION, HYPERTENSION HEADACHE EXAM: MRI HEAD WITHOUT CONTRAST MRA HEAD WITHOUT CONTRAST MRA NECK WITHOUT AND WITH CONTRAST TECHNIQUE: Multiplanar, multi-echo pulse sequences of the brain and surrounding structures were acquired without intravenous contrast. Angiographic images of the Circle of Willis were acquired using MRA technique without intravenous contrast. Angiographic images of the neck were acquired using MRA technique without and with intravenous contrast. Carotid stenosis measurements (when applicable) are obtained utilizing NASCET criteria, using the distal internal carotid diameter as the denominator. CONTRAST:  77m GADAVIST GADOBUTROL 1 MMOL/ML IV SOLN COMPARISON:  CT HEAD 06/05/2022 FINDINGS: MRI HEAD FINDINGS Brain: Restricted diffusion with ADC correlate in the right caudate head (series 5, image 5 and series 7, image 23) and in the right lentiform nucleus (series 5,  image 25 and series 7, image 21). No acute hemorrhage, mass, mass effect, or midline shift. No hydrocephalus or extra-axial collection. No hemosiderin deposition to suggest remote hemorrhage. T2 hyperintense signal in the periventricular white matter and pons, likely the sequela of mild-to-moderate chronic small vessel ischemic disease. Cerebral volume is within normal limits for age. Vascular: Normal arterial flow voids. Skull and upper cervical spine: Normal marrow signal. Sinuses/Orbits: Mucosal thickening in the ethmoid air cells. Air-fluid level in the left sphenoid sinus. Mild mucosal thickening in the maxillary sinuses. Status post bilateral lens replacements. Other: The mastoids are well aerated. MRA HEAD FINDINGS Evaluation is somewhat limited by motion artifact. Anterior circulation: Both internal carotid arteries are patent to the termini, with mild narrowing of the proximal supraclinoid ICAs. A1 segments patent. Poor visualization of  the bilateral A2 segments, which is favored to be artifactual. Anterior cerebral arteries are otherwise patent to their distal aspects. No M1 stenosis or occlusion. Normal MCA bifurcations. Distal MCA branches perfused but intermittently poorly visualized, likely secondary to motion. Posterior circulation: Vertebral arteries patent to the vertebrobasilar junction. Basilar patent to its distal aspect. Superior cerebellar arteries patent bilaterally. Patent P1 segments. PCAs perfused to their distal aspects without stenosis. The bilateral posterior communicating arteries are not visualized. Anatomic variants: None significant MRA NECK FINDINGS Aortic arch: Standard aortic branching. Imaged portion shows no evidence of aneurysm or dissection. No significant stenosis of the major arch vessel origins. Right carotid system: No evidence of dissection, occlusion, or hemodynamically significant stenosis (greater than 50%). Left carotid system: No evidence of dissection, occlusion, or  hemodynamically significant stenosis (greater than 50%). Vertebral arteries: No evidence of dissection, occlusion, or hemodynamically significant stenosis (greater than 50%). Other: None IMPRESSION: 1. 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 2. MRA head is limited by motion. Within this limitation, no intracranial large vessel occlusion or severe stenosis. 3. No hemodynamically significant stenosis in the neck. 4. Air-fluid level in the left sphenoid sinus, as can be seen with acute sinusitis. Correlate with symptoms. These results were called by telephone at the time of interpretation on 06/06/2022 at 2:12 am to provider KATY FOUST, who verbally acknowledged these results. Electronically Signed   By: Merilyn Baba M.D.   On: 06/06/2022 02:34      Time coordinating discharge: Over 30 minutes  SIGNED:  Emeterio Reeve DO [facility]

## 2022-06-07 NOTE — TOC Progression Note (Signed)
Transition of Care [facility]) - Progression Note    Patient Details  Name: OCEANNA ARRUDA MRN: [id number] Date of Birth: 01/30/1934  Transition of Care [facility]) CM/SW Contact  Laurena Slimmer, RN Phone Number: 06/07/2022, 9:51 AM  Clinical Narrative:    Spoke with patient's daughter, Jenita Seashore regarding discharge planned for today and HH recommendation. No preference of [place] agency. Patient daughter will transport her home. Patient's pharmacy is [facility] Referral sent and accepted by [place] at [facility].   9:57 [place] notified of [place] agency acceptance. Advised agency will contact patient or family.       Expected Discharge Plan and Services           Expected Discharge Date: 06/07/22                                     Social Determinants of Health (SDOH) Interventions    Readmission Risk Interventions     No data to display

## 2022-06-07 NOTE — Plan of Care (Signed)
?  Problem: Education: ?Goal: Knowledge of General Education information will improve ?Description: Including pain rating scale, medication(s)/side effects and non-pharmacologic comfort measures ?Outcome: Progressing ?  ?Problem: Health Behavior/Discharge Planning: ?Goal: Ability to manage health-related needs will improve ?Outcome: Progressing ?  ?Problem: Clinical Measurements: ?Goal: Ability to maintain clinical measurements within normal limits will improve ?Outcome: Progressing ?Goal: Will remain free from infection ?Outcome: Progressing ?Goal: Diagnostic test results will improve ?Outcome: Progressing ?Goal: Respiratory complications will improve ?Outcome: Progressing ?Goal: Cardiovascular complication will be avoided ?Outcome: Progressing ?  ?Problem: Activity: ?Goal: Risk for activity intolerance will decrease ?Outcome: Progressing ?  ?Problem: Nutrition: ?Goal: Adequate nutrition will be maintained ?Outcome: Progressing ?  ?Problem: Coping: ?Goal: Level of anxiety will decrease ?Outcome: Progressing ?  ?Problem: Elimination: ?Goal: Will not experience complications related to bowel motility ?Outcome: Progressing ?Goal: Will not experience complications related to urinary retention ?Outcome: Progressing ?  ?Problem: Pain Managment: ?Goal: General experience of comfort will improve ?Outcome: Progressing ?  ?Problem: Safety: ?Goal: Ability to remain free from injury will improve ?Outcome: Progressing ?  ?Problem: Skin Integrity: ?Goal: Risk for impaired skin integrity will decrease ?Outcome: Progressing ?  ?Problem: Education: ?Goal: Knowledge of disease or condition will improve ?Outcome: Progressing ?Goal: Knowledge of secondary prevention will improve (SELECT ALL) ?Outcome: Progressing ?Goal: Knowledge of patient specific risk factors will improve (INDIVIDUALIZE FOR PATIENT) ?Outcome: Progressing ?  ?

## 2022-06-07 NOTE — Progress Notes (Signed)
Pt discharged home with family. Discharge instructions reviewed with patient and daughter, verbalizes understanding. PIV and tele removed.

## 2022-06-07 NOTE — Progress Notes (Signed)
Patient alert and oriented x3, became agitated this shift toward family, did not want to stay in bed. Patient wanted to go home. Hospitalist notified, '25mg'$  trazodone PO ordered, patient refused to take. Haldol '1mg'$  IV ordered per hospitalist. Family remain at beside, will continue to monitor.

## 2022-06-09 DIAGNOSIS — I639 Cerebral infarction, unspecified: Secondary | ICD-10-CM | POA: Diagnosis not present

## 2022-06-12 ENCOUNTER — Telehealth: Payer: Self-pay

## 2022-06-14 ENCOUNTER — Encounter: Payer: Self-pay | Admitting: Family Medicine

## 2022-06-14 ENCOUNTER — Ambulatory Visit (INDEPENDENT_AMBULATORY_CARE_PROVIDER_SITE_OTHER): Payer: Medicare HMO | Admitting: Family Medicine

## 2022-06-14 VITALS — BP 120/82 | HR 77 | Temp 97.9°F | Ht <= 58 in | Wt 125.0 lb

## 2022-06-14 DIAGNOSIS — R8290 Unspecified abnormal findings in urine: Secondary | ICD-10-CM

## 2022-06-14 DIAGNOSIS — I639 Cerebral infarction, unspecified: Secondary | ICD-10-CM

## 2022-06-14 DIAGNOSIS — Z8673 Personal history of transient ischemic attack (TIA), and cerebral infarction without residual deficits: Secondary | ICD-10-CM | POA: Diagnosis not present

## 2022-06-14 LAB — CBC WITH DIFFERENTIAL/PLATELET
Basophils Absolute: 0.1 10*3/uL (ref 0.0–0.1)
Basophils Relative: 1.5 % (ref 0.0–3.0)
Eosinophils Absolute: 0.8 10*3/uL — ABNORMAL HIGH (ref 0.0–0.7)
Eosinophils Relative: 11.4 % — ABNORMAL HIGH (ref 0.0–5.0)
HCT: 43.4 % (ref 36.0–46.0)
Hemoglobin: 14 g/dL (ref 12.0–15.0)
Lymphocytes Relative: 21 % (ref 12.0–46.0)
Lymphs Abs: 1.5 10*3/uL (ref 0.7–4.0)
MCHC: 32.2 g/dL (ref 30.0–36.0)
MCV: 91.8 fl (ref 78.0–100.0)
Monocytes Absolute: 0.8 10*3/uL (ref 0.1–1.0)
Monocytes Relative: 11.3 % (ref 3.0–12.0)
Neutro Abs: 3.9 10*3/uL (ref 1.4–7.7)
Neutrophils Relative %: 54.8 % (ref 43.0–77.0)
Platelets: 273 10*3/uL (ref 150.0–400.0)
RBC: 4.73 Mil/uL (ref 3.87–5.11)
RDW: 13.6 % (ref 11.5–15.5)
WBC: 7.1 10*3/uL (ref 4.0–10.5)

## 2022-06-14 LAB — BASIC METABOLIC PANEL
BUN: 23 mg/dL (ref 6–23)
CO2: 34 mEq/L — ABNORMAL HIGH (ref 19–32)
Calcium: 9.8 mg/dL (ref 8.4–10.5)
Chloride: 101 mEq/L (ref 96–112)
Creatinine, Ser: 1.32 mg/dL — ABNORMAL HIGH (ref 0.40–1.20)
GFR: 36.81 mL/min — ABNORMAL LOW (ref >60.00)
Glucose, Bld: 74 mg/dL (ref 70–99)
Potassium: 4.7 mEq/L (ref 3.5–5.1)
Sodium: 141 mEq/L (ref 135–145)

## 2022-06-14 LAB — UNLABELED: Test Ordered On Req: 395

## 2022-06-14 MED ORDER — PRAVASTATIN SODIUM 10 MG PO TABS: ORAL_TABLET | ORAL | 3 refills | Status: DC

## 2022-06-14 NOTE — Patient Instructions (Addendum)
Go to the lab on the way out.   If you have mychart we'll likely use that to update you.     Aspirin and plavix for 2 weeks then plavix alone.   Try pravastatin MWF and then daily if tolerated.  Update me as needed.  If any new symptoms, then go to the ER.  Take care.  Glad to see you. We'll call about seeing neurology.    ==============================   This is what I have listed in your chart:  If patient had correctable issue, then she would accept ventilator, etc, to allow time to improve. She wouldn't want long term interventions.  If she can't get better, she desires to be comfortable.

## 2022-06-14 NOTE — Progress Notes (Signed)
Inpatient f/u for CVA.   Speech is back to baseline.  Memory is better since coming home- noted in the last few days with less repetition.    Discussed rationale for treatment with 3 weeks DAPT then plavix thereafter.  No bleeding.  Discussed statin use.  See after visit summary.  Zio patch in place now.  Rationale for use discussed with patient.  Referral to neuro placed.  Discussed neurology follow-up.  She has HHPT.  I told her I would check to see if she can get home health aid with showers.    Recheck labs pending.  See notes on labs. Still with L sciatica.  She is taking hydrocodone 1 tab BID.  Will eventually need refills for August Sept October.  No ADE on med.   Code discussion with patient.  If patient had correctable issue, then she would accept ventilator, etc, to allow time to improve. She wouldn't want long term interventions.  If she can't get better, she desires to be comfortable.  We talked about this in the office visit and she can consider if she wants to amend this.  Meds, vitals, and allergies reviewed.   ROS: Per HPI unless specifically indicated in ROS section   GEN: nad, alert and oriented HEENT: ncat NECK: supple w/o LA CV: rrr.  PULM: ctab, no inc wob ABD: soft, +bs EXT: no edema SKIN: no acute rash Zio patch in place.  40 minutes were devoted to patient care in this encounter (this includes time spent reviewing the patient's file/history, interviewing and examining the patient, counseling/reviewing plan with patient).

## 2022-06-15 ENCOUNTER — Other Ambulatory Visit: Payer: Self-pay | Admitting: Family Medicine

## 2022-06-15 DIAGNOSIS — R8290 Unspecified abnormal findings in urine: Secondary | ICD-10-CM

## 2022-06-15 DIAGNOSIS — R7989 Other specified abnormal findings of blood chemistry: Secondary | ICD-10-CM

## 2022-06-18 NOTE — Assessment & Plan Note (Signed)
Fortunately she is improved.  Discussed options going forward Aspirin and plavix for 2 weeks then plavix alone.   Try pravastatin MWF and then daily if tolerated.  Update me as needed.  If any new symptoms, then go to the ER.  Refer to neurology. We will see about getting home health aide. See notes on labs. CODE STATUS discussed with patient.  See above.

## 2022-06-21 ENCOUNTER — Other Ambulatory Visit (INDEPENDENT_AMBULATORY_CARE_PROVIDER_SITE_OTHER): Payer: Medicare HMO

## 2022-06-21 DIAGNOSIS — R8290 Unspecified abnormal findings in urine: Secondary | ICD-10-CM

## 2022-06-21 DIAGNOSIS — R7989 Other specified abnormal findings of blood chemistry: Secondary | ICD-10-CM

## 2022-06-22 LAB — BASIC METABOLIC PANEL
BUN: 16 mg/dL (ref 6–23)
CO2: 31 mEq/L (ref 19–32)
Calcium: 9.2 mg/dL (ref 8.4–10.5)
Chloride: 100 mEq/L (ref 96–112)
Creatinine, Ser: 1.21 mg/dL — ABNORMAL HIGH (ref 0.40–1.20)
GFR: 40.85 mL/min — ABNORMAL LOW (ref >60.00)
Glucose, Bld: 87 mg/dL (ref 70–99)
Potassium: 4.1 mEq/L (ref 3.5–5.1)
Sodium: 138 mEq/L (ref 135–145)

## 2022-06-22 LAB — URINALYSIS, ROUTINE W REFLEX MICROSCOPIC
Bilirubin Urine: NEGATIVE
Hgb urine dipstick: NEGATIVE
Ketones, ur: NEGATIVE
Leukocytes,Ua: NEGATIVE
Nitrite: NEGATIVE
Specific Gravity, Urine: 1.01 (ref 1.000–1.030)
Total Protein, Urine: NEGATIVE
Urine Glucose: NEGATIVE
Urobilinogen, UA: 0.2 (ref 0.0–1.0)
pH: 5.5 (ref 5.0–8.0)

## 2022-06-26 ENCOUNTER — Telehealth: Payer: Self-pay | Admitting: Medical

## 2022-06-26 NOTE — Telephone Encounter (Signed)
Called pt and spoke with daughter. PT home visit today - checked BP @ 154/102 and rechecked at 190/122 and did not proceed with PT today d/t high BP. Daughter called cardiology office.   Daughter rechecked BP prior to calling office: 186/118 Denies blurred vision. Pt does have HA currently.  Pt also w/ incr sciatic pain recently. Daughter states pt took a pain pill and half tablet of Ativan.   Daughter states pt had a stroke ~3 weeks ago.  Daughter states she just mailed back pt's zio monitor.  Pt scheduled to follow up in office with Cadence Furth, PA-C 08/06/22.   Advised daughter to take pt to emergency room d/t significantly high BP.  Dtr states she knows pt is not going to go to the ER.  Dtr proceed to tell me that she noticed pt had mental status change the last two days, "I just noticed mom seemed more distant than usual." Repeated to daughter I strongly advise pt goes to the ER.  Daughter states she understands recommendation. Daughter asks if pt can be seen sooner in office to address BP.   Pt takes Metoprolol Succinate 100 mg twice daily. Pt will take second dose. Pt does have BP cuff at home.  Advised daughter after pt takes second dose of metoprolol to recheck BP and if continued elevated I still advise she go to the ER.   Daughter voiced understanding.

## 2022-06-26 NOTE — Telephone Encounter (Signed)
Pt c/o BP issue: STAT if pt c/o blurred vision, one-sided weakness or slurred speech  1. What are your last 5 BP readings? 154/102; 190/122; 76  2. Are you having any other symptoms (ex. Dizziness, headache, blurred vision, passed out)? Headache  3. What is your BP issue? Patient is experiencing extremely high BP

## 2022-06-27 ENCOUNTER — Ambulatory Visit (INDEPENDENT_AMBULATORY_CARE_PROVIDER_SITE_OTHER): Payer: Medicare HMO | Admitting: Family

## 2022-06-27 ENCOUNTER — Encounter: Payer: Self-pay | Admitting: Family

## 2022-06-27 ENCOUNTER — Other Ambulatory Visit: Payer: Self-pay | Admitting: Family Medicine

## 2022-06-27 ENCOUNTER — Telehealth: Payer: Self-pay | Admitting: Family Medicine

## 2022-06-27 VITALS — BP 190/100 | HR 64 | Temp 98.9°F | Resp 16 | Ht <= 58 in | Wt 124.0 lb

## 2022-06-27 DIAGNOSIS — I270 Primary pulmonary hypertension: Secondary | ICD-10-CM

## 2022-06-27 DIAGNOSIS — I10 Essential (primary) hypertension: Secondary | ICD-10-CM | POA: Diagnosis not present

## 2022-06-27 DIAGNOSIS — I471 Supraventricular tachycardia: Secondary | ICD-10-CM | POA: Diagnosis not present

## 2022-06-27 DIAGNOSIS — R8271 Bacteriuria: Secondary | ICD-10-CM | POA: Insufficient documentation

## 2022-06-27 DIAGNOSIS — Z8673 Personal history of transient ischemic attack (TIA), and cerebral infarction without residual deficits: Secondary | ICD-10-CM | POA: Diagnosis not present

## 2022-06-27 MED ORDER — AMLODIPINE BESYLATE 5 MG PO TABS: 7.5000 mg | ORAL_TABLET | Freq: Every day | ORAL | Status: DC

## 2022-06-27 MED ORDER — AMLODIPINE BESYLATE 5 MG PO TABS: 5.0000 mg | ORAL_TABLET | Freq: Every day | ORAL | 0 refills | Status: DC

## 2022-06-27 NOTE — Assessment & Plan Note (Signed)
Ordering urine culture pending results

## 2022-06-27 NOTE — Telephone Encounter (Signed)
[facility] - Client TELEPHONE ADVICE RECORD [facility] Patient Name: Jillian Oconnell D Gender: Female DOB: [birth year] Age: 88 Y 14 M Return Phone Number: [phone] (Primary) Address: City/ State/ Zip: [place]  [address] Client [facility] - Client Client Site [place] - Day Provider Renford Dills - MD Contact Type Call Who Is Calling Patient / Member / Family / Caregiver Call Type Triage / Clinical Caller Name Jasper Loser Relationship To Patient Daughter Return Phone Number [phone] (Primary) Chief Complaint Blood Pressure High Reason for Call Symptomatic / Request for [place] states her mother is having high blood pressure 174/101. Translation No Nurse Assessment Nurse: Gildardo Pounds, RN, Amy Date/Time (Eastern Time): 06/27/2022 8:19:41 AM Confirm and document reason for call. If symptomatic, describe symptoms. ---Caller states her mother is having high blood pressure of 174/101 this morning. She rechecked just now, & it is 186/121. She had a CVA 3 weeks ago. Since the stroke or maybe before her BP was always fine. She is on Metoprolol for heart arrythmia not HTN. She just sent heart monitor that she wore for 3 weeks on Monday. Cardiologist not until the 25th. Does the patient have any new or worsening symptoms? ---Yes Will a triage be completed? ---Yes Related visit to physician within the last 2 weeks? ---No Does the PT have any chronic conditions? (i.e. diabetes, asthma, this includes High risk factors for pregnancy, etc.) ---Yes List chronic conditions. ---CVA, Left Sciatica Is this a behavioral health or substance abuse call? ---No Guidelines Guideline Title Affirmed Question Affirmed Notes Nurse Date/Time (Eastern Time) Blood Pressure - High [3] Systolic BP >= 244 OR Diastolic >= 010 AND [2] having NO cardiac or neurologic  symptoms Lovelace, RN, Amy 06/27/2022 8:23:34 AM PLEASE NOTE: All timestamps contained within this report are represented as [place] Standard Time. CONFIDENTIALTY NOTICE: This fax transmission is intended only for the addressee. It contains information that is legally privileged, confidential or otherwise protected from use or disclosure. If you are not the intended recipient, you are strictly prohibited from reviewing, disclosing, copying using or disseminating any of this information or taking any action in reliance on or regarding this information. If you have received this fax in error, please notify us immediately by telephone so that we can arrange for its return to [place]. Phone: [phone], Toll-Free: [phone], Fax: [phone] Page: 2 of 2 Call Id: [id number] [place]. Time Eilene Ghazi Time) Disposition Final User 06/27/2022 8:25:24 AM See HCP within 4 Hours (or PCP triage) Yes Lovelace, RN, Amy Final Disposition 06/27/2022 8:25:24 AM See HCP within 4 Hours (or PCP triage) Yes Lovelace, RN, Amy Caller Disagree/Comply Comply Caller Understands Yes PreDisposition InappropriateToAsk Care Advice Given Per Guideline SEE HCP (OR PCP TRIAGE) WITHIN 4 HOURS: * IF OFFICE WILL BE OPEN: You need to be seen within the next 3 or 4 hours. Call your doctor (or NP/PA) now or as soon as the office opens. CALL BACK IF: * Weakness or numbness of the face, arm or leg on one side of the body occurs * Difficulty walking, difficulty talking, or severe headache occurs * Chest pain or difficulty breathing occurs * You become worse CARE ADVICE given per High Blood Pressure (Adult) guideline. Comments User: Wayne Sever, RN Date/Time Eilene Ghazi Time): 06/27/2022 8:28:38 AM Warm transferred caller to the office to make an appt. Referrals REFERRED TO PCP OFFIC

## 2022-06-27 NOTE — Assessment & Plan Note (Signed)
Continue asa and plavix  Asa x 3 weeks F/u with neurology as well as with cardiologist If any s/s blurry vision, headache, chest pain, palp, sob and or change in mental status or slurred speech immediately go to er and or call 911.

## 2022-06-27 NOTE — Telephone Encounter (Signed)
Pt already scheduled to see Red Christians FNP today at 10:40. Sending note to Eugenia Pancoast FNP and Claiborne Billings CMA.

## 2022-06-27 NOTE — Assessment & Plan Note (Signed)
rx start amlodipine 5 mg once daily.  Pt advised of the following:  Continue medication as prescribed. Monitor blood pressure periodically and/or when you feel symptomatic. Goal is <130/90 on average. Ensure that you have rested for 30 minutes prior to checking your blood pressure. Record your readings and bring them to your next visit if necessary.work on a low sodium diet.

## 2022-06-27 NOTE — Telephone Encounter (Signed)
Patient daughter Jillian Oconnell called in stating that [facility] blood pressure yesterday was 190/152, Jillian Oconnell is currently having headaches. Jillian Oconnell states that Jillian Oconnell is not able to get into heart doctor until late August. Did have patient speak with access nurse.

## 2022-06-27 NOTE — Assessment & Plan Note (Signed)
Continue metoprolol. 

## 2022-06-27 NOTE — Telephone Encounter (Signed)
Thank you with your recommendations. Agree with recommendations  Saw pt in office, please refer to note.

## 2022-06-27 NOTE — Progress Notes (Signed)
Established Patient Office Visit  Subjective:  Patient ID: Jillian Oconnell, female    DOB: [birth year]  Age: 88 y.o. MRN: [id number]  CC:  Chief Complaint  Patient presents with   Hypertension    HPI Jillian Oconnell is here today with concerns.   Accompanied by her daughter.  She recently had a stroke, went to ER 06/05/22 she had had slurring of speech. Ekg was nsr. Initial troponin elevated, ct head negative for acute finding, admitted.   MRI 06/06/22 MRI brain acute infarcts in right caudate head and lentiform nucleus. MRA head limited by motion.  She is on metoprolol 100 mg twice daily.  She is taking daily asa 81 mg once daliy as well as plavix 75 mg once daily.   Discharged 06/07/22.   She did call her cardiologist yesterday, but was told to go to the ER which she refused. She has appt with them august 25th for eval/treat.   She denies chest pain, palpitations, sob, blurry vision or headache.   She also is suffering from worsening sciatica , left lower back with radiation to right below the lower leg.   Was supposed to stay on asa + plavis for three weeks, then just plavix after that.  Has not had an update about f/u wit neurologist quite yet.   Bacteria in urine in hospital. Urine culture ordered 7/6 not resulted, should have been by now. Leaving urine sample today. Denies urinary freq urgency and or dysuria.   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 Minor luminal irregularities with no evidence of obstructive disease.   cataract surgery     [place]  2004   TOTAL ABDOMINAL HYSTERECTOMY      Family History  Problem Relation Age of Onset   Hypertension Mother    Breast cancer Mother 44   Cancer Sister        colon   Colon cancer Sister    Hypertension Father    Aneurysm Father        AAA   Cancer Daughter        thyroid   Anxiety disorder Daughter    Breast cancer Daughter 33   Breast cancer Daughter     Social History   Socioeconomic History   Marital status: Widowed    Spouse name: Not on file   Number of children: Not on file   Years of education: Not on file   Highest education level: Not on file  Occupational History  Occupation: Retired    Fish farm manager: retired    Comment: Agricultural engineer  Tobacco Use   Smoking status: Never   Smokeless tobacco: Never  Scientific laboratory technician Use: Never used  Substance and Sexual Activity   Alcohol use: No    Alcohol/week: 0.0 standard drinks of alcohol   Drug use: No   Sexual activity: Not Currently    Birth control/protection: Post-menopausal  Other Topics Concern   Not on file  Social History Narrative   Regular exercise- no    Moved here from [place]- to be closer to family    Husband died of lung CA 02-28-2013   Social Determinants of Health   Financial Resource Strain: Low Risk  (12/04/2021)   Overall Financial Resource Strain (CARDIA)    Difficulty of Paying Living Expenses: Not hard at all  Food Insecurity: No Food Insecurity (12/04/2021)   Hunger Vital Sign    Worried About Running Out of Food in the Last Year: Never true    Ran Out of Food in the Last Year: Never true  Transportation Needs: No Transportation Needs (12/04/2021)   PRAPARE -  Hydrologist (Medical): No    Lack of Transportation (Non-Medical): No  Physical Activity: Inactive (12/04/2021)   Exercise Vital Sign    Days of Exercise per Week: 0 days    Minutes of Exercise per Session: 0 min  Stress: No Stress Concern Present (12/04/2021)   [place]    Feeling of Stress : Not at all  Social Connections: Socially Isolated (12/04/2021)   Social Connection and Isolation Panel [NHANES]    Frequency of Communication with Friends and Family: More than three times a week    Frequency of Social Gatherings with Friends and Family: More than three times a week    Attends Religious Services: Never    Marine scientist or Organizations: No    Attends Archivist Meetings: Never    Marital Status: Widowed  Intimate Partner Violence: Not At Risk (12/04/2021)   Humiliation, Afraid, Rape, and Kick questionnaire    Fear of Current or Ex-Partner: No    Emotionally Abused: No    Physically Abused: No    Sexually Abused: No    Outpatient Medications Prior to Visit  Medication Sig Dispense Refill   albuterol (VENTOLIN HFA) 108 (90 Base) MCG/ACT inhaler Inhale 1-2 puffs into the lungs every 6 (six) hours as needed for wheezing or shortness of breath. 1 each 0   aspirin EC 81 MG tablet Take 1 tablet (81 mg total) by mouth daily. Swallow whole. 30 tablet 0   Calcium Carbonate-Vitamin D 600-400 MG-UNIT tablet Take 1 tablet by mouth two times a day     clopidogrel (PLAVIX) 75 MG tablet Take 1 tablet (75 mg total) by mouth daily. 30 tablet 0   fluticasone (FLONASE) 50 MCG/ACT nasal spray Place 2 sprays into both nostrils daily. 16 g 12   Fluticasone Propionate, Inhal, (FLOVENT DISKUS) 250 MCG/BLIST AEPB INHALE 2 PUFFS IN THE MORNING AND INHALE 2 PUFFS IN THE EVENING (RINSE AFTER USE) 60 each 12   HYDROcodone-acetaminophen (NORCO) 7.5-325 MG tablet Take 1 tablet by mouth 3  (three) times daily as needed (for pain.). Fill on/after 06/18/22 90 tablet 0   HYDROcodone-acetaminophen (NORCO) 7.5-325 MG tablet Take 1 tablet by mouth 3 (three) times daily as needed (fill on/after 05/19/22). 90 tablet 0   lansoprazole (PREVACID) 15 MG capsule  Take 1 capsule by mouth once daily 90 capsule 3   levothyroxine (SYNTHROID) 25 MCG tablet Take 1 tablet (25 mcg total) by mouth daily. 90 tablet 3   LORazepam (ATIVAN) 1 MG tablet TAKE 1/2 TABLET BY MOUTH TWICE DAILY AS NEEDED (SEDATION CAUTION) 90 tablet 0   metoprolol succinate (TOPROL-XL) 100 MG 24 hr tablet TAKE 1 TABLET TWICE DAILY WITH OR IMMEDIATELY FOLLOWING A MEAL 180 tablet 1   montelukast (SINGULAIR) 10 MG tablet TAKE 1 TABLET AT BEDTIME 90 tablet 1   pravastatin (PRAVACHOL) 10 MG tablet 1 tab at night MWF.  Then increase to daily if tolerated. 90 tablet 3   No facility-administered medications prior to visit.    Allergies  Allergen Reactions   Albuterol     REACTION: jittery   Alendronate Sodium     REACTION: GI side eff   Amoxicillin-Pot Clavulanate     REACTION: non tolerant- but can take plain amox   Atorvastatin Nausea And Vomiting   Azithromycin     GI upset.  Not an allergy.   Ezetimibe-Simvastatin     REACTION: myalgia   Gabapentin     REACTION: tremor   Ibandronate Sodium     REACTION: GI side eff   Lexapro [Escitalopram Oxalate]     sedation   Lyrica [Pregabalin] Other (See Comments)    heart racing and didn't help pain.    Neosporin [Bacitracin-Polymyxin B]     Irritation locally   Risedronate Sodium     REACTION: GI side eff   Rosuvastatin     REACTION: myalgia   Sertraline     sedation   Sulfonamide Derivatives     REACTION: not tolerate        Objective:    Physical Exam Constitutional:      General: She is not in acute distress.    Appearance: Normal appearance. She is normal weight. She is not ill-appearing, toxic-appearing or diaphoretic.  Cardiovascular:     Rate and Rhythm:  Normal rate and regular rhythm.     Pulses:          Dorsalis pedis pulses are 2+ on the right side and 2+ on the left side.       Posterior tibial pulses are 2+ on the right side and 2+ on the left side.  Pulmonary:     Effort: Pulmonary effort is normal.     Breath sounds: Normal breath sounds.  Musculoskeletal:     Right lower leg: No edema.     Left lower leg: No edema.  Neurological:     General: No focal deficit present.     Mental Status: She is alert and oriented to person, place, and time.  Psychiatric:        Mood and Affect: Mood normal.        Behavior: Behavior normal.        Thought Content: Thought content normal.        Judgment: Judgment normal.     BP (!) 190/100   Pulse 64   Temp 98.9 F (37.2 C)   Resp 16   Ht '4\' 10"'$  (1.473 m)   Wt 124 lb (56.2 kg)   SpO2 97%   BMI 25.92 kg/m  Wt Readings from Last 3 Encounters:  06/27/22 124 lb (56.2 kg)  06/14/22 125 lb (56.7 kg)  06/05/22 140 lb (63.5 kg)     Health Maintenance Due  Topic Date Due   Zoster Vaccines- Shingrix (1 of 2)  Never done   MAMMOGRAM  09/26/2021   COVID-19 Vaccine (5 - Booster for Pfizer series) 12/01/2021   TETANUS/TDAP  05/02/2022    There are no preventive care reminders to display for this patient.  Lab Results  Component Value Date   TSH 2.173 06/05/2022   Lab Results  Component Value Date   WBC 7.1 06/14/2022   HGB 14.0 06/14/2022   HCT 43.4 06/14/2022   MCV 91.8 06/14/2022   PLT 273.0 06/14/2022   Lab Results  Component Value Date   NA 138 06/21/2022   K 4.1 06/21/2022   CO2 31 06/21/2022   GLUCOSE 87 06/21/2022   BUN 16 06/21/2022   CREATININE 1.21 (H) 06/21/2022   BILITOT 0.4 06/05/2022   ALKPHOS 61 06/05/2022   AST 24 06/05/2022   ALT 16 06/05/2022   PROT 8.3 (H) 06/05/2022   ALBUMIN 4.2 06/05/2022   CALCIUM 9.2 06/21/2022   ANIONGAP 11 06/07/2022   GFR 40.85 (L) 06/21/2022   Lab Results  Component Value Date   HGBA1C 5.5 06/06/2022       Assessment & Plan:   Problem List Items Addressed This Visit       Cardiovascular and Mediastinum   HTN (hypertension)    rx start amlodipine 5 mg once daily.  Pt advised of the following:  Continue medication as prescribed. Monitor blood pressure periodically and/or when you feel symptomatic. Goal is <130/90 on average. Ensure that you have rested for 30 minutes prior to checking your blood pressure. Record your readings and bring them to your next visit if necessary.work on a low sodium diet.       Relevant Medications   amLODipine (NORVASC) 5 MG tablet   Paroxysmal supraventricular tachycardia (HCC)    Continue metoprolol       Relevant Medications   amLODipine (NORVASC) 5 MG tablet   Pulmonary hypertension, primary (HCC) - Primary   Relevant Medications   amLODipine (NORVASC) 5 MG tablet     Other   History of CVA in adulthood    Continue asa and plavix  Asa x 3 weeks F/u with neurology as well as with cardiologist If any s/s blurry vision, headache, chest pain, palp, sob and or change in mental status or slurred speech immediately go to er and or call 911.      Bacteria in urine    Ordering urine culture pending results       Relevant Orders   Urine Culture    Meds ordered this encounter  Medications   amLODipine (NORVASC) 5 MG tablet    Sig: Take 1 tablet (5 mg total) by mouth daily.    Dispense:  90 tablet    Refill:  0    Order Specific Question:   Supervising Provider    Answer:   Diona Browner, AMY E [[id number]]    Follow-up: Return in about 1 week (around 07/04/2022) for with Dr. Damita Dunnings for follow up on blood pressure .    Eugenia Pancoast, FNP

## 2022-06-27 NOTE — Patient Instructions (Signed)
Starting on amlodpine 5 mg once daily.  Start monitoring your blood pressure daily, around the same time of day, for the next 2-3 weeks.  Ensure that you have rested for 30 minutes prior to checking your blood pressure. Record your readings and bring them to your next visit.  If any sudden onset chest pain, shortness of breath, slurred speech, headache, blurred vision, go to er and or call 911.   Due to recent changes in healthcare laws, you may see results of your imaging and/or laboratory studies on MyChart before I have had a chance to review them.  I understand that in some cases there may be results that are confusing or concerning to you. Please understand that not all results are received at the same time and often I may need to interpret multiple results in order to provide you with the best plan of care or course of treatment. Therefore, I ask that you please give me 2 business days to thoroughly review all your results before contacting my office for clarification. Should we see a critical lab result, you will be contacted sooner.   It was a pleasure seeing you today! Please do not hesitate to reach out with any questions and or concerns.  Regards,   Eugenia Pancoast FNP-C

## 2022-06-29 LAB — URINE CULTURE
MICRO NUMBER:: 13637317
Result:: NO GROWTH
SPECIMEN QUALITY:: ADEQUATE

## 2022-07-01 ENCOUNTER — Telehealth: Payer: Self-pay | Admitting: Family Medicine

## 2022-07-01 DIAGNOSIS — Z8673 Personal history of transient ischemic attack (TIA), and cerebral infarction without residual deficits: Secondary | ICD-10-CM

## 2022-07-01 NOTE — Telephone Encounter (Signed)
See order(s).

## 2022-07-05 ENCOUNTER — Ambulatory Visit (INDEPENDENT_AMBULATORY_CARE_PROVIDER_SITE_OTHER): Payer: Medicare HMO | Admitting: Family Medicine

## 2022-07-05 ENCOUNTER — Encounter: Payer: Self-pay | Admitting: Family Medicine

## 2022-07-05 DIAGNOSIS — I10 Essential (primary) hypertension: Secondary | ICD-10-CM

## 2022-07-05 DIAGNOSIS — I270 Primary pulmonary hypertension: Secondary | ICD-10-CM

## 2022-07-05 DIAGNOSIS — M5430 Sciatica, unspecified side: Secondary | ICD-10-CM | POA: Diagnosis not present

## 2022-07-05 DIAGNOSIS — I639 Cerebral infarction, unspecified: Secondary | ICD-10-CM | POA: Diagnosis not present

## 2022-07-05 DIAGNOSIS — R8290 Unspecified abnormal findings in urine: Secondary | ICD-10-CM

## 2022-07-05 MED ORDER — METOPROLOL SUCCINATE ER 100 MG PO TB24
200.0000 mg | ORAL_TABLET | Freq: Every day | ORAL | Status: DC
Start: 2022-07-05 — End: 2022-08-22

## 2022-07-05 MED ORDER — PRAVASTATIN SODIUM 10 MG PO TABS: 10.0000 mg | ORAL_TABLET | Freq: Every day | ORAL | Status: DC

## 2022-07-05 MED ORDER — AMLODIPINE BESYLATE 5 MG PO TABS: 5.0000 mg | ORAL_TABLET | Freq: Every day | ORAL | Status: DC

## 2022-07-05 MED ORDER — CLOPIDOGREL BISULFATE 75 MG PO TABS: 75.0000 mg | ORAL_TABLET | Freq: Every day | ORAL | 3 refills | Status: DC

## 2022-07-05 MED ORDER — LORAZEPAM 1 MG PO TABS: ORAL_TABLET | ORAL | 0 refills | Status: DC

## 2022-07-05 MED ORDER — HYDROCODONE-ACETAMINOPHEN 7.5-325 MG PO TABS: 1.0000 | ORAL_TABLET | Freq: Three times a day (TID) | ORAL | 0 refills | Status: DC | PRN

## 2022-07-05 NOTE — Patient Instructions (Addendum)
If you are lightheaded, then cut the amlodipine in half.   Please check with the spine clinic about getting an MRI that you can tolerate.  Take care.  Glad to see you.

## 2022-07-05 NOTE — Progress Notes (Signed)
Hypertension:    Using medication without problems or lightheadedness: yes Chest pain with exertion:no Edema:no Short of breath: only with sig exertion.    Taking amlodipine '5mg'$  daily and metoprolol '200mg'$  a day.   She needed refill on lorazepam and hydrocodone and plavix.    Still with L leg pain from sciatica.  More pain getting up.  Less at rest/sitting.  Radicular pain.  Paresthesia at the L ankle.  We talked about spine clinic f/u- she is considering if she could put up with MRI.    Meds, vitals, and allergies reviewed.  ROS: Per HPI unless specifically indicated in ROS section   GEN: nad, alert and oriented HEENT: ncat NECK: supple w/o LA CV: rrr. PULM: ctab, no inc wob ABD: soft, +bs EXT: no edema SKIN: no acute rash

## 2022-07-06 ENCOUNTER — Telehealth: Payer: Self-pay

## 2022-07-06 NOTE — Telephone Encounter (Signed)
Called Stacey at [facility] back and clarified referral orders per referral notes. Nothing further needed.

## 2022-07-06 NOTE — Telephone Encounter (Signed)
Marzetta Board is calling in from [facility], asking for clarification on [place] orders they received.

## 2022-07-08 NOTE — Assessment & Plan Note (Signed)
Plain films discussed with patient.  Images reviewed with patient.  Discussed with her about following up with a spine clinic.  See after visit summary.  Continue hydrocodone for now.  Not sedated.

## 2022-07-08 NOTE — Assessment & Plan Note (Signed)
Continue amlodipine and metoprolol.  Routine cautions given to patient.  See after visit summary.

## 2022-07-12 ENCOUNTER — Telehealth: Payer: Self-pay | Admitting: Family Medicine

## 2022-07-12 NOTE — Telephone Encounter (Signed)
Home Health verbal orders Agency Name: [facility]   Requesting OT/PT/Skilled nursing/Social Work/Speech:OT  Reason:THERAPEUTIC EXERCISES  Frequency 1 WEEK 5  Please forward to [facility] pool or providers CMA

## 2022-07-12 NOTE — Telephone Encounter (Signed)
Please give the order.  Thanks.   

## 2022-07-12 NOTE — Telephone Encounter (Signed)
Verbal orders have been given to [place].

## 2022-07-13 ENCOUNTER — Ambulatory Visit
Admission: EM | Admit: 2022-07-13 | Discharge: 2022-07-13 | Disposition: A | Discharge status: Home / Self Care | Payer: Medicare HMO | Attending: Emergency Medicine | Admitting: Emergency Medicine

## 2022-07-13 ENCOUNTER — Encounter: Payer: Self-pay | Admitting: Emergency Medicine

## 2022-07-13 DIAGNOSIS — R350 Frequency of micturition: Secondary | ICD-10-CM | POA: Diagnosis not present

## 2022-07-13 DIAGNOSIS — R300 Dysuria: Secondary | ICD-10-CM | POA: Insufficient documentation

## 2022-07-13 HISTORY — DX: Cerebral infarction, unspecified: I63.9

## 2022-07-13 LAB — POCT URINALYSIS DIP (MANUAL ENTRY)
Bilirubin, UA: NEGATIVE
Glucose, UA: NEGATIVE mg/dL
Ketones, POC UA: NEGATIVE mg/dL
Nitrite, UA: NEGATIVE
Protein Ur, POC: NEGATIVE mg/dL
Spec Grav, UA: 1.015 (ref 1.010–1.025)
Urobilinogen, UA: 0.2 E.U./dL
pH, UA: 5 (ref 5.0–8.0)

## 2022-07-13 NOTE — Discharge Instructions (Addendum)
The urine culture is pending.  Follow up with your primary care provider if your symptoms are not improving.

## 2022-07-13 NOTE — ED Triage Notes (Addendum)
Patient c/o urinary frequency and dysuria x 2 days.   Patient denies fever, N/V,  and ABD pain.   Patient endorses Lft lower back pain . Patient states pain may be related to sciatica. Patient endorses pain radiates down leg.   Patient states she recently had a stroke on 7/20 and was recently discharged from hospital.   Patient has used cranberry juice with no relief of symptoms.

## 2022-07-13 NOTE — ED Provider Notes (Signed)
Jillian Oconnell    CSN: [id number] Arrival date & time: 07/13/22  1215      History   Chief Complaint Chief Complaint  Patient presents with   Urinary Frequency   Dysuria    HPI Jillian Oconnell is a 88 y.o. female.  Accompanied by her granddaughter, patient presents with 2-day history of dysuria and urinary frequency.  She also has left lower back which radiates to her left leg; she states this is her usual sciatica.  No fever, chills, abdominal pain, flank pain, or other symptoms.  Treating symptoms with cranberry juice.  Urine culture on 06/27/2022 negative.  Her medical history includes hypertension, CVA, pulmonary hypertension, asthma, sciatica.  The history is provided by the patient and medical records.    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    Shingles    post herpetic neuralgia (pain clinic in past)   Squamous cell carcinoma of skin 03/07/2015   right preauricular    Stroke [facility])     Patient Active Problem List   Diagnosis Date Noted   Bacteria in urine 06/27/2022   Pulmonary hypertension, primary ([place]) 06/27/2022   History of CVA in adulthood 06/06/2022   Chronic pain on prescribed opiates  06/06/2022   Hypothyroidism 01/01/2022   Hair loss 12/18/2021   Sciatica 03/12/2021   Paresthesia 11/27/2020   Hyperkalemia 08/13/2019   Hemorrhoids 07/02/2018   Chronic use of opiate for therapeutic purpose 01/26/2018   Advance care planning 04/28/2017   Hearing loss 03/24/2017   Headache 01/24/2017   Fall at home  10/28/2016   Skin lesion 06/29/2016   Symptomatic PVCs 02/06/2016   Impingement syndrome of right shoulder 07/20/2015   Paroxysmal supraventricular tachycardia ([place])    Dysuria 08/10/2011   CARPAL TUNNEL SYNDROME 01/09/2011   Osteoporosis 09/06/2010   Depression 03/16/2009   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 Minor luminal irregularities with no evidence of obstructive disease.   cataract surgery     [place]  2004   TOTAL ABDOMINAL HYSTERECTOMY      OB History     Gravida  3   Para      Term      Preterm      AB      Living  3      SAB      IAB      Ectopic      Multiple      Live Births  3  Home Medications    Prior to Admission medications   Medication Sig Start Date End Date Taking? Authorizing Provider  amLODipine (NORVASC) 5 MG tablet Take 1 tablet (5 mg total) by mouth daily. 07/05/22  Yes Tonia Ghent, MD  Calcium Carbonate-Vitamin D 600-400 MG-UNIT tablet Take 1 tablet by mouth two times a day   Yes [provider]  clopidogrel (PLAVIX) 75 MG tablet Take 1 tablet (75 mg total) by mouth daily. 07/05/22  Yes Tonia Ghent, MD  HYDROcodone-acetaminophen (NORCO) 7.5-325 MG tablet Take 1 tablet by mouth 3 (three) times daily as needed (for pain.). Fill on/after 07/19/22 07/05/22  Yes Tonia Ghent, MD  lansoprazole (PREVACID) 15 MG capsule Take 1 capsule by mouth once daily 02/11/22  Yes Tonia Ghent, MD  levothyroxine (SYNTHROID) 25 MCG tablet Take 1 tablet (25 mcg total) by mouth daily. 01/01/22  Yes Tonia Ghent, MD  LORazepam (ATIVAN) 1 MG tablet TAKE 1/2 TABLET BY MOUTH  TWICE DAILY AS NEEDED (SEDATION CAUTION) 07/05/22  Yes Tonia Ghent, MD  metoprolol succinate (TOPROL-XL) 100 MG 24 hr tablet Take 2 tablets (200 mg total) by mouth daily. Take with or immediately following a meal. 07/05/22  Yes Tonia Ghent, MD  montelukast (SINGULAIR) 10 MG tablet TAKE 1 TABLET AT BEDTIME 11/21/21  Yes Tonia Ghent, MD  pravastatin (PRAVACHOL) 10 MG tablet Take 1 tablet (10 mg total) by mouth daily. 07/05/22  Yes Tonia Ghent, MD  albuterol (VENTOLIN HFA) 108 (90 Base) MCG/ACT inhaler Inhale 1-2 puffs into the lungs every 6 (six) hours as needed for wheezing or shortness of breath. 05/29/21   Tonia Ghent, MD  aspirin EC 81 MG tablet Take 1 tablet (81 mg total) by mouth daily. Swallow whole. 06/07/22   Emeterio Reeve, DO  fluticasone [facility]) 50 MCG/ACT nasal spray Place 2 sprays into both nostrils daily. 05/29/21   Tonia Ghent, MD  Fluticasone Propionate, Inhal, (FLOVENT DISKUS) 250 MCG/BLIST AEPB INHALE 2 PUFFS IN THE MORNING AND INHALE 2 PUFFS IN THE EVENING (RINSE AFTER USE) 05/29/21   Tonia Ghent, MD    Family History Family History  Problem Relation Age of Onset   Hypertension Mother    Breast cancer Mother 17   Cancer Sister        colon   Colon cancer Sister    Hypertension Father    Aneurysm Father        AAA   Cancer Daughter        thyroid   Anxiety disorder Daughter    Breast cancer Daughter 2   Breast cancer Daughter     Social History Social History   Tobacco Use   Smoking status: Never   Smokeless tobacco: Never  Vaping Use   Vaping Use: Never used  Substance Use Topics   Alcohol use: No    Alcohol/week: 0.0 standard drinks of alcohol   Drug use: No     Allergies   Albuterol, Alendronate sodium, Amoxicillin-pot clavulanate, Atorvastatin, Azithromycin, Ezetimibe-simvastatin, Gabapentin, Ibandronate sodium, Lexapro [escitalopram oxalate], Lyrica [pregabalin], Neosporin [bacitracin-polymyxin b], Risedronate sodium,  Rosuvastatin, Sertraline, and Sulfonamide derivatives   Review of Systems Review of Systems  Constitutional:  Negative for chills and fever.  Gastrointestinal:  Negative for abdominal pain, nausea and vomiting.  Genitourinary:  Positive for dysuria and frequency. Negative for hematuria.  Musculoskeletal:  Positive for back pain.  Neurological:  Negative for weakness and numbness.  All other systems reviewed and are negative.  Physical Exam Triage Vital Signs ED Triage Vitals  Enc Vitals Group     BP      Pulse      Resp      Temp      Temp src      SpO2      Weight      Height      Head Circumference      Peak Flow      Pain Score      Pain Loc      Pain Edu?      Excl. in [place]?    No data found.  Updated Vital Signs BP (!) 139/99 (BP Location: Left Arm)   Pulse 82   Temp 98 F (36.7 C) (Oral)   Resp 16   SpO2 96%   Visual Acuity Right Eye Distance:   Left Eye Distance:   Bilateral Distance:    Right Eye Near:   Left Eye Near:    Bilateral Near:     Physical Exam Vitals and nursing note reviewed.  Constitutional:      General: She is not in acute distress.    Appearance: Normal appearance. She is well-developed. She is not ill-appearing.  HENT:     Mouth/Throat:     Mouth: Mucous membranes are moist.  Cardiovascular:     Rate and Rhythm: Normal rate and regular rhythm.     Heart sounds: Normal heart sounds.  Pulmonary:     Effort: Pulmonary effort is normal. No respiratory distress.     Breath sounds: Normal breath sounds.  Abdominal:     Palpations: Abdomen is soft.     Tenderness: There is no abdominal tenderness. There is no right CVA tenderness, left CVA tenderness, guarding or rebound.  Musculoskeletal:     Cervical back: Neck supple.  Skin:    General: Skin is warm and dry.  Neurological:     Mental Status: She is alert.  Psychiatric:        Mood and Affect: Mood normal.        Behavior: Behavior normal.      UC Treatments /  Results  Labs (all labs ordered are listed, but only abnormal results are displayed) Labs Reviewed  POCT URINALYSIS DIP (MANUAL ENTRY) - Abnormal; Notable for the following components:      Result Value   Clarity, UA cloudy (*)    Blood, UA small (*)    Leukocytes, UA Small (1+) (*)    All other components within normal limits  URINE CULTURE    EKG   Radiology No results found.  Procedures Procedures (including critical care time)  Medications Ordered in UC Medications - No data to display  Initial Impression / Assessment and Plan / UC Course  I have reviewed the triage vital signs and the nursing notes.  Pertinent labs & imaging results that were available during my care of the patient were reviewed by me and considered in my medical decision making (see chart for details).    Dysuria, urinary frequency.  Urine culture pending.  Discussed with patient and her granddaughter that we will call if culture shows started for treatment.  Discussed symptomatic treatment.  Instructed patient to follow up with her PCP if her symptoms are not improving.  She agrees to plan of care.    Final Clinical Impressions(s) / UC Diagnoses   Final diagnoses:  Dysuria  Urinary frequency     Discharge Instructions  The urine culture is pending.  Follow up with your primary care provider if your symptoms are not improving.        ED Prescriptions   None    PDMP not reviewed this encounter.   Sharion Balloon, NP 07/13/22 1254

## 2022-07-15 LAB — URINE CULTURE: Culture: >=100000 — AB

## 2022-07-16 ENCOUNTER — Ambulatory Visit: Payer: Medicare HMO | Admitting: Dermatology

## 2022-07-16 ENCOUNTER — Telehealth (HOSPITAL_COMMUNITY): Payer: Self-pay | Admitting: Emergency Medicine

## 2022-07-16 DIAGNOSIS — L578 Other skin changes due to chronic exposure to nonionizing radiation: Secondary | ICD-10-CM

## 2022-07-16 DIAGNOSIS — L821 Other seborrheic keratosis: Secondary | ICD-10-CM

## 2022-07-16 DIAGNOSIS — Z85828 Personal history of other malignant neoplasm of skin: Secondary | ICD-10-CM | POA: Diagnosis not present

## 2022-07-16 DIAGNOSIS — L570 Actinic keratosis: Secondary | ICD-10-CM

## 2022-07-16 DIAGNOSIS — L814 Other melanin hyperpigmentation: Secondary | ICD-10-CM | POA: Diagnosis not present

## 2022-07-16 DIAGNOSIS — D692 Other nonthrombocytopenic purpura: Secondary | ICD-10-CM

## 2022-07-16 MED ORDER — CIPROFLOXACIN HCL 250 MG PO TABS: 250.0000 mg | ORAL_TABLET | Freq: Two times a day (BID) | ORAL | 0 refills | Status: AC

## 2022-07-16 NOTE — Progress Notes (Signed)
Follow-Up Visit   Subjective  Jillian Oconnell is a 88 y.o. female who presents for the following: Spot check (Face x months, growing and itchy). History of AKs treated in the past. Hx of SCC of the right preauricular (2015).  Patient accompanied by daughter.  The following portions of the chart were reviewed this encounter and updated as appropriate:       Review of Systems:  No other skin or systemic complaints except as noted in HPI or Assessment and Plan.  Objective  Well appearing patient in no apparent distress; mood and affect are within normal limits.  A focused examination was performed including face, arms. Relevant physical exam findings are noted in the Assessment and Plan.  upper nasal dorsum x 1, Right lateral eyebrow x 1 (2) Pink brown scaly macules    Assessment & Plan  Actinic Damage - chronic, secondary to cumulative UV radiation exposure/sun exposure over time - diffuse scaly erythematous macules with underlying dyspigmentation - Recommend daily broad spectrum sunscreen SPF 30+ to sun-exposed areas, reapply every 2 hours as needed.  - Recommend staying in the shade or wearing long sleeves, sun glasses (UVA+UVB protection) and wide brim hats (4-inch brim around the entire circumference of the hat). - Call for new or changing lesions.  History of Squamous Cell Carcinoma of the Skin - No evidence of recurrence today of the right preauricular - Recommend regular full body skin exams - Recommend daily broad spectrum sunscreen SPF 30+ to sun-exposed areas, reapply every 2 hours as needed.  - Call if any new or changing lesions are noted between office visits  Seborrheic Keratoses - Stuck-on, waxy, tan-brown papules and/or plaques  - Benign-appearing - Discussed benign etiology and prognosis. - Observe - Call for any changes  Lentigines - Scattered tan macules - Due to sun exposure - Benign-appering, observe - Recommend daily broad spectrum sunscreen SPF  30+ to sun-exposed areas, reapply every 2 hours as needed. - Call for any changes  Purpura - Chronic; persistent and recurrent.  Treatable, but not curable. - Violaceous macules and patches - Benign - Related to trauma, age, sun damage and/or use of blood thinners, chronic use of topical and/or oral steroids - Observe - Can use OTC arnica containing moisturizer such as Dermend Bruise Formula if desired - Call for worsening or other concerns  AK (actinic keratosis) (2) upper nasal dorsum x 1, Right lateral eyebrow x 1  vs Inflamed SKs  Actinic keratoses are precancerous spots that appear secondary to cumulative UV radiation exposure/sun exposure over time. They are chronic with expected duration over 1 year. A portion of actinic keratoses will progress to squamous cell carcinoma of the skin. It is not possible to reliably predict which spots will progress to skin cancer and so treatment is recommended to prevent development of skin cancer.  Recommend daily broad spectrum sunscreen SPF 30+ to sun-exposed areas, reapply every 2 hours as needed.  Recommend staying in the shade or wearing long sleeves, sun glasses (UVA+UVB protection) and wide brim hats (4-inch brim around the entire circumference of the hat). Call for new or changing lesions.  Destruction of lesion - upper nasal dorsum x 1, Right lateral eyebrow x 1  Destruction method: cryotherapy   Informed consent: discussed and consent obtained   Lesion destroyed using liquid nitrogen: Yes   Region frozen until ice ball extended beyond lesion: Yes   Outcome: patient tolerated procedure well with no complications   Post-procedure details: wound care instructions given  Additional details:  Prior to procedure, discussed risks of blister formation, small wound, skin dyspigmentation, or rare scar following cryotherapy. Recommend Vaseline ointment to treated areas while healing.    Return if symptoms worsen or fail to improve.  IJamesetta Orleans, CMA, am acting as scribe for Brendolyn Patty, MD .  Documentation: I have reviewed the above documentation for accuracy and completeness, and I agree with the above.  Brendolyn Patty MD

## 2022-07-16 NOTE — Patient Instructions (Addendum)
Cryotherapy Aftercare  Wash gently with soap and water everyday.   Apply Vaseline and Band-Aid daily until healed.     Due to recent changes in healthcare laws, you may see results of your pathology and/or laboratory studies on MyChart before the doctors have had a chance to review them. We understand that in some cases there may be results that are confusing or concerning to you. Please understand that not all results are received at the same time and often the doctors may need to interpret multiple results in order to provide you with the best plan of care or course of treatment. Therefore, we ask that you please give us 2 business days to thoroughly review all your results before contacting the office for clarification. Should we see a critical lab result, you will be contacted sooner.   If You Need Anything After Your Visit  If you have any questions or concerns for your doctor, please call our main line at [phone] and press option 4 to reach your doctor's medical assistant. If no one answers, please leave a voicemail as directed and we will return your call as soon as possible. Messages left after 4 pm will be answered the following business day.   You may also send us a message via MyChart. We typically respond to MyChart messages within 1-2 business days.  For prescription refills, please ask your pharmacy to contact our office. Our fax number is [phone].  If you have an urgent issue when the clinic is closed that cannot wait until the next business day, you can page your doctor at the number below.    Please note that while we do our best to be available for urgent issues outside of office hours, we are not available 24/7.   If you have an urgent issue and are unable to reach us, you may choose to seek medical care at your doctor's office, retail clinic, urgent care center, or emergency room.  If you have a medical emergency, please immediately call 911 or go to the  emergency department.  Pager Numbers  - Dr. Kowalski: [phone]  - Dr. Moye: [phone]  - Dr. Stewart: [phone]  In the event of inclement weather, please call our main line at [phone] for an update on the status of any delays or closures.  Dermatology Medication Tips: Please keep the boxes that topical medications come in in order to help keep track of the instructions about where and how to use these. Pharmacies typically print the medication instructions only on the boxes and not directly on the medication tubes.   If your medication is too expensive, please contact our office at [phone] option 4 or send us a message through MyChart.   We are unable to tell what your co-pay for medications will be in advance as this is different depending on your insurance coverage. However, we may be able to find a substitute medication at lower cost or fill out paperwork to get insurance to cover a needed medication.   If a prior authorization is required to get your medication covered by your insurance company, please allow us 1-2 business days to complete this process.  Drug prices often vary depending on where the prescription is filled and some pharmacies may offer cheaper prices.  The website www.goodrx.com contains coupons for medications through different pharmacies. The prices here do not account for what the cost may be with help from insurance (it may be cheaper with your insurance), but the website can   give you the price if you did not use any insurance.  - You can print the associated coupon and take it with your prescription to the pharmacy.  - You may also stop by our office during regular business hours and pick up a GoodRx coupon card.  - If you need your prescription sent electronically to a different pharmacy, notify our office through [facility] MyChart or by phone at [phone] option 4.     Si Usted Necesita Algo Despus de Su Visita  Tambin puede  enviarnos un mensaje a travs de MyChart. Por lo general respondemos a los mensajes de MyChart en el transcurso de 1 a 2 das hbiles.  Para renovar recetas, por favor pida a su farmacia que se ponga en contacto con nuestra oficina. Nuestro nmero de fax es el [phone].  Si tiene un asunto urgente cuando la clnica est cerrada y que no puede esperar hasta el siguiente da hbil, puede llamar/localizar a su doctor(a) al nmero que aparece a continuacin.   Por favor, tenga en cuenta que aunque hacemos todo lo posible para estar disponibles para asuntos urgentes fuera del horario de oficina, no estamos disponibles las 24 horas del da, los 7 das de la semana.   Si tiene un problema urgente y no puede comunicarse con nosotros, puede optar por buscar atencin mdica  en el consultorio de su doctor(a), en una clnica privada, en un centro de atencin urgente o en una sala de emergencias.  Si tiene una emergencia mdica, por favor llame inmediatamente al 911 o vaya a la sala de emergencias.  Nmeros de bper  - Dr. Kowalski: [phone]  - Dra. Moye: [phone]  - Dra. Stewart: [phone]  En caso de inclemencias del tiempo, por favor llame a nuestra lnea principal al [phone] para una actualizacin sobre el estado de cualquier retraso o cierre.  Consejos para la medicacin en dermatologa: Por favor, guarde las cajas en las que vienen los medicamentos de uso tpico para ayudarle a seguir las instrucciones sobre dnde y cmo usarlos. Las farmacias generalmente imprimen las instrucciones del medicamento slo en las cajas y no directamente en los tubos del medicamento.   Si su medicamento es muy caro, por favor, pngase en contacto con nuestra oficina llamando al [phone] y presione la opcin 4 o envenos un mensaje a travs de MyChart.   No podemos decirle cul ser su copago por los medicamentos por adelantado ya que esto es diferente dependiendo de la cobertura de su seguro.  Sin embargo, es posible que podamos encontrar un medicamento sustituto a menor costo o llenar un formulario para que el seguro cubra el medicamento que se considera necesario.   Si se requiere una autorizacin previa para que su compaa de seguros cubra su medicamento, por favor permtanos de 1 a 2 das hbiles para completar este proceso.  Los precios de los medicamentos varan con frecuencia dependiendo del lugar de dnde se surte la receta y alguna farmacias pueden ofrecer precios ms baratos.  El sitio web www.goodrx.com tiene cupones para medicamentos de diferentes farmacias. Los precios aqu no tienen en cuenta lo que podra costar con la ayuda del seguro (puede ser ms barato con su seguro), pero el sitio web puede darle el precio si no utiliz ningn seguro.  - Puede imprimir el cupn correspondiente y llevarlo con su receta a la farmacia.  - Tambin puede pasar por nuestra oficina durante el horario de atencin regular y recoger una tarjeta de cupones de GoodRx.  -   Si necesita que su receta se enve electrnicamente a una farmacia diferente, informe a nuestra oficina a travs de MyChart de [place] o por telfono llamando al [phone] y presione la opcin 4.  

## 2022-07-20 ENCOUNTER — Telehealth: Payer: Self-pay | Admitting: Family Medicine

## 2022-07-20 NOTE — Telephone Encounter (Signed)
Melissa Hilton from [facility] called in to report Jillian Oconnell missed her OT appointment this week as she was sick. Thank you!

## 2022-07-24 NOTE — Telephone Encounter (Signed)
Noted. Thanks.

## 2022-07-29 ENCOUNTER — Encounter: Payer: Self-pay | Admitting: Family Medicine

## 2022-08-01 ENCOUNTER — Other Ambulatory Visit: Payer: Self-pay | Admitting: Family Medicine

## 2022-08-01 MED ORDER — HYDROCODONE-ACETAMINOPHEN 7.5-325 MG PO TABS: 1.0000 | ORAL_TABLET | Freq: Three times a day (TID) | ORAL | 0 refills | Status: DC | PRN

## 2022-08-01 NOTE — Progress Notes (Signed)
Rx sent for upcoming months for hydrocodone.

## 2022-08-05 NOTE — Progress Notes (Unsigned)
Cardiology Office Note:    Date:  08/06/2022   ID:  Jillian Oconnell, DOB October 31, 1934, MRN [id number]  PCP:  Jillian Ghent, MD  [facility] Cardiologist:  Jillian Sacramento, MD  [place] Electrophysiologist:  None   Referring MD: Jillian Ghent, MD   Chief Complaint: Hospital follow-up  History of Present Illness:    Jillian Oconnell is a 88 y.o. female with a hx of Sinus tachycardia, frequent PVCs, nonobstructive CAD who presents for follow-up.   She had a previous cardiac cath in February 2009 which showed minor luminal irregularities with no evidence of obstutive disease. EF was normal. Echo 2017 showed normal LVSF and no significant valvular disease.   Most recent Holter monitor in October 2017 showed very frequent PVCs with a total of 34,000 beats representing 25% burden.  She had an attempted repeat echocardiogram in 2021 but was not diagnostic due to poor visualization.   PVCs have been well controlled with Toprol.  Last seen 03/2022 and was doing well from a cardiac standpoint.   Admitted 05/2022 for slurred speech found to have aceute CVA. She was also treated for UTI. Zio was placed at discharge. She was sent home on ASA and Plavix.   Heart monitor showed normal rhythm with 41 episodes of SVT longest 12.9 seconds and rare PACs/PVCs.  Today, the patient Overall is doing well. Memory is a little poor. She is eating poorly. She uses a walker. No residual weakness. Denies chest pain. She has occasional LLE, orthopnea or pnd. She stopped ASA due to stomach pain. Heart monitor was reviewed.   EKG today shows rapid afib with rate of 124bpm. She is overall asymptomatic and euvolemic.   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 Minor luminal irregularities with no evidence of obstructive disease.   cataract surgery     [place]  2004   TOTAL ABDOMINAL HYSTERECTOMY      Current Medications: Current Meds  Medication Sig   albuterol (VENTOLIN HFA) 108 (90 Base) MCG/ACT inhaler Inhale 1-2 puffs into the lungs every 6 (six) hours as needed for wheezing or shortness of breath.   amLODipine (NORVASC) 5 MG tablet Take 1 tablet (5 mg total) by mouth daily.   apixaban (ELIQUIS) 2.5 MG TABS tablet Take 1 tablet (2.5 mg total) by mouth 2 (two) times daily.   Calcium Carbonate-Vitamin D 600-400 MG-UNIT tablet Take 1 tablet by mouth two times a day   clopidogrel (PLAVIX) 75  MG tablet Take 1 tablet (75 mg total) by mouth daily.   fluticasone (FLONASE) 50 MCG/ACT nasal spray Place 2 sprays into both nostrils daily.   Fluticasone Propionate, Inhal, (FLOVENT DISKUS) 250 MCG/BLIST AEPB INHALE 2 PUFFS IN THE MORNING AND INHALE 2 PUFFS IN THE EVENING (RINSE AFTER USE)   HYDROcodone-acetaminophen (NORCO) 7.5-325 MG tablet Take 1 tablet by mouth 3 (three) times daily as needed (for pain.). Fill on/after 08/28/22   HYDROcodone-acetaminophen (NORCO) 7.5-325 MG tablet Take 1 tablet by mouth 3 (three) times daily as needed (for pain. fill on/after 09/27/22).   lansoprazole (PREVACID) 15 MG capsule Take 1 capsule by mouth once daily   levothyroxine (SYNTHROID) 25 MCG tablet Take 1 tablet (25 mcg total) by  mouth daily.   LORazepam (ATIVAN) 1 MG tablet TAKE 1/2 TABLET BY MOUTH TWICE DAILY AS NEEDED (SEDATION CAUTION)   metoprolol succinate (TOPROL-XL) 100 MG 24 hr tablet Take 2 tablets (200 mg total) by mouth daily. Take with or immediately following a meal.   montelukast (SINGULAIR) 10 MG tablet TAKE 1 TABLET AT BEDTIME     Allergies:   Albuterol, Alendronate sodium, Amoxicillin-pot clavulanate, Atorvastatin, Azithromycin, Ezetimibe-simvastatin, Gabapentin, Ibandronate sodium, Lexapro [escitalopram oxalate], Lyrica [pregabalin], Neosporin [bacitracin-polymyxin b], Risedronate sodium, Rosuvastatin, Sertraline, and Sulfonamide derivatives   Social History   Socioeconomic History   Marital status: Widowed    Spouse name: Not on file   Number of children: Not on file   Years of education: Not on file   Highest education level: Not on file  Occupational History   Occupation: Retired    Fish farm manager: retired    Comment: Homemaker  Tobacco Use   Smoking status: Never   Smokeless tobacco: Never  Scientific laboratory technician Use: Never used  Substance and Sexual Activity   Alcohol use: No    Alcohol/week: 0.0 standard drinks of alcohol   Drug use: No   Sexual activity: Not Currently    Birth control/protection: Post-menopausal  Other Topics Concern   Not on file  Social History Narrative   Regular exercise- no    Moved here from [place]- to be closer to family    Husband died of lung CA March 15, 2013   Social Determinants of Health   Financial Resource Strain: Low Risk  (12/04/2021)   Overall Financial Resource Strain (CARDIA)    Difficulty of Paying Living Expenses: Not hard at all  Food Insecurity: No Food Insecurity (12/04/2021)   Hunger Vital Sign    Worried About Running Out of Food in the Last Year: Never true    [place] in the Last Year: Never true  Transportation Needs: No Transportation Needs (12/04/2021)   PRAPARE - Hydrologist (Medical): No    Lack of  Transportation (Non-Medical): No  Physical Activity: Inactive (12/04/2021)   Exercise Vital Sign    Days of Exercise per Week: 0 days    Minutes of Exercise per Session: 0 min  Stress: No Stress Concern Present (12/04/2021)   [place]    Feeling of Stress : Not at all  Social Connections: Socially Isolated (12/04/2021)   Social Connection and Isolation Panel [NHANES]    Frequency of Communication with Friends and Family: More than three times a week    Frequency of Social Gatherings with Friends and Family: More than three times a week    Attends Religious Services: Never    Marine scientist or  Organizations: No    Attends Archivist Meetings: Never    Marital Status: Widowed     Family History: [name] family history includes Aneurysm in her father; Anxiety disorder in her daughter; Breast cancer in her daughter; Breast cancer (age of onset: 72) in her daughter; Breast cancer (age of onset: 42) in her mother; Cancer in her daughter and sister; Colon cancer in her sister; Hypertension in her father and mother.  ROS:   Please see the history of present illness.     All other systems reviewed and are negative.  EKGs/Labs/Other Studies Reviewed:    The following studies were reviewed today: Heart monitor 06/2022 Study Highlights    Patch Wear Time:  13 days and 18 hours (2023-06-24T13:41:02-399 to 2023-07-08T07:48:07-399)   Patient had a min HR of 59 bpm, max HR of 156 bpm, and avg HR of 74 bpm. Predominant underlying rhythm was Sinus Rhythm.  41 Supraventricular Tachycardia runs occurred, the run with the fastest interval lasting 5 beats with a max rate of 156 bpm, the  longest lasting 12.9 secs with an avg rate of 115 bpm. Rare PACs and rare PVCs.     Echo 05/2022  1. Left ventricular ejection fraction, by estimation, is 60 to 65%. The  left ventricle has normal function. The left  ventricle has no regional  wall motion abnormalities. Left ventricular diastolic function could not  be evaluated.   2. Right ventricular systolic function is normal. The right ventricular  size is not well visualized.   3. The mitral valve is degenerative. No evidence of mitral valve  regurgitation. Severe mitral annular calcification.   4. The aortic valve is calcified. Aortic valve regurgitation is not  visualized. Aortic valve sclerosis/calcification is present, without any  evidence of aortic stenosis.   5. The inferior vena cava is normal in size with greater than 50%  respiratory variability, suggesting right atrial pressure of 3 mmHg.   EKG:  EKG is ordered today.  The ekg ordered today demonstrates Afib 124bpm, nonspecific T wave changes  Recent Labs: 06/05/2022: ALT 16; B Natriuretic Peptide 410.1; Magnesium 1.9; TSH 2.173 06/14/2022: Hemoglobin 14.0; Platelets 273.0 06/21/2022: BUN 16; Creatinine, Ser 1.21; Potassium 4.1; Sodium 138  Recent Lipid Panel    Component Value Date/Time   CHOL 166 06/06/2022 0427   TRIG 60 06/06/2022 0427   HDL 64 06/06/2022 0427   CHOLHDL 2.6 06/06/2022 0427   VLDL 12 06/06/2022 0427   LDLCALC 90 06/06/2022 0427   LDLCALC 116 (H) 08/07/2019 1507   LDLDIRECT 148.1 01/06/2013 1157      Physical Exam:    VS:  BP 110/62 (BP Location: Left Arm, Patient Position: Sitting, Cuff Size: Normal)   Pulse (!) 124   Ht '4\' 10"'$  (1.473 m)   Wt 129 lb (58.5 kg)   SpO2 96%   BMI 26.96 kg/m     Wt Readings from Last 3 Encounters:  08/06/22 129 lb (58.5 kg)  07/05/22 124 lb (56.2 kg)  06/27/22 124 lb (56.2 kg)     GEN:  Well nourished, well developed in no acute distress HEENT: Normal NECK: No JVD; No carotid bruits LYMPHATICS: No lymphadenopathy CARDIAC: Irreg Irreg, no murmurs, rubs, gallops RESPIRATORY:  Clear to auscultation without rales, wheezing or rhonchi  ABDOMEN: Soft, non-tender, non-distended MUSCULOSKELETAL:  No edema; No deformity   SKIN: Warm and dry NEUROLOGIC:  Alert and oriented x 3 PSYCHIATRIC:  Normal affect   ASSESSMENT:    1. PAF (paroxysmal  atrial fibrillation) ([place])   2. Cerebrovascular accident (CVA), unspecified mechanism ([place])   3. Symptomatic PVCs   4. Paroxysmal SVT (supraventricular tachycardia) ([place])   5. Essential hypertension    PLAN:    In order of problems listed above:  New onset Afib EKG shows new onset Afib RVR with heart rate 124bpm. She suffered Acute CVA in 05/2022 and follow-up heart monitor showed no afib. Unsure if patient is in and out of afib. Overall she is asymptomatic. CHADSVASC at least 6 (CVA, female, Agex2, HTN). We will start Eliquis 2,'5mg'$ BID (age and weight). She already is on Toprol 100 BID. I will see her back in 4 weeks. She says she is not wanting to undergo cardioversion, we will consider AAA at that time.   Acute CVA 05/2022 She has no residual weakness, may have residual memory issues. She uses a walker. She has follow-up with neurology next month. She stopped ASA for stomach issues. Continue Plavix for now, neurology recs for anticoagulation appreciated. She stopped statin for stomach pain .   Symtomatic PVCs H/o sinus tachycardia pSVT Continue Toprol '100mg'$  BID. She is asymptomatic.   HTN BP is borderline soft at times. Continue amlodipine '5mg'$  and Toprol '100mg'$ BID  Disposition: Follow up in 2 month(s) with MD/APP    Signed, Amorette Charrette Ninfa Meeker, PA-C  08/06/2022 7:44 PM     [facility]

## 2022-08-06 ENCOUNTER — Encounter: Payer: Self-pay | Admitting: Medical

## 2022-08-06 ENCOUNTER — Ambulatory Visit: Payer: Medicare HMO | Admitting: Medical

## 2022-08-06 VITALS — BP 110/62 | HR 124 | Ht <= 58 in | Wt 129.0 lb

## 2022-08-06 DIAGNOSIS — I493 Ventricular premature depolarization: Secondary | ICD-10-CM

## 2022-08-06 DIAGNOSIS — I480 Paroxysmal atrial fibrillation: Secondary | ICD-10-CM | POA: Diagnosis not present

## 2022-08-06 DIAGNOSIS — I10 Essential (primary) hypertension: Secondary | ICD-10-CM | POA: Diagnosis not present

## 2022-08-06 DIAGNOSIS — I471 Supraventricular tachycardia: Secondary | ICD-10-CM

## 2022-08-06 DIAGNOSIS — I639 Cerebral infarction, unspecified: Secondary | ICD-10-CM | POA: Diagnosis not present

## 2022-08-06 MED ORDER — APIXABAN 2.5 MG PO TABS: 2.5000 mg | ORAL_TABLET | Freq: Two times a day (BID) | ORAL | 0 refills | Status: DC

## 2022-08-06 NOTE — Patient Instructions (Signed)
Medication Instructions:  Your physician has recommended you make the following change in your medication:   START Eliquis 2.5 mg twice a day. An Rx has been sent to your pharmacy.   *If you need a refill on your cardiac medications before your next appointment, please call your pharmacy*   Lab Work: None ordered If you have labs (blood work) drawn today and your tests are completely normal, you will receive your results only by: [place] (if you have MyChart) OR A paper copy in the mail If you have any lab test that is abnormal or we need to change your treatment, we will call you to review the results.   Testing/Procedures: None ordered   Follow-Up: At [facility], you and your health needs are our priority.  As part of our continuing mission to provide you with exceptional heart care, we have created designated Provider Care Teams.  These Care Teams include your primary Cardiologist (physician) and Advanced Practice Providers (APPs -  Physician Assistants and Nurse Practitioners) who all work together to provide you with the care you need, when you need it.  We recommend signing up for the patient portal called "MyChart".  Sign up information is provided on this After Visit Summary.  MyChart is used to connect with patients for Virtual Visits (Telemedicine).  Patients are able to view lab/test results, encounter notes, upcoming appointments, etc.  Non-urgent messages can be sent to your provider as well.   To learn more about what you can do with MyChart, go to [email or web address].    Your next appointment:   4 week(s)  The format for your next appointment:   In Person  Provider:   Cadence Kathlen Mody, PA-C{  You have been referred to EP Dr. Quentin Ore for PAF    Other Instructions N/A  Important Information About [place]

## 2022-08-16 ENCOUNTER — Telehealth: Payer: Self-pay

## 2022-08-16 NOTE — Telephone Encounter (Signed)
-----   Message from [place], PA-C sent at 08/13/2022 12:31 PM EDT ----- Can we call patient are let patient know to stop Plavix ----- Message ----- From: Wellington Hampshire, MD Sent: 08/12/2022  10:03 AM EDT To: Antony Madura, PA-C  Given initiation of Eliquis, I recommend stopping Plavix.  No indication to be on both.  Thanks.   ----- Message ----- From: Antony Madura, PA-C Sent: 08/06/2022   7:48 PM EDT To: Wellington Hampshire, MD  Hosp follow-up CVA. EKG shows Afib today. Will start Eliquis 2.'5mg'$  BID. Already on BB. Overal asymptomatic. Plan for follow-up 1 month

## 2022-08-16 NOTE — Telephone Encounter (Signed)
-----   Message from [place], PA-C sent at 08/13/2022 12:31 PM EDT ----- Can we call patient are let patient know to stop Plavix ----- Message ----- From: Wellington Hampshire, MD Sent: 08/12/2022  10:03 AM EDT To: Antony Madura, PA-C  Given initiation of Eliquis, I recommend stopping Plavix.  No indication to be on both.  Thanks.   ----- Message ----- From: Antony Madura, PA-C Sent: 08/06/2022   7:48 PM EDT To: Wellington Hampshire, MD  Hosp follow-up CVA. EKG shows Afib today. Will start Eliquis 2.'5mg'$  BID. Already on BB. Overal asymptomatic. Plan for follow-up 1 month

## 2022-08-16 NOTE — Telephone Encounter (Signed)
DPR on file. Patients daughter [name] made aware of Dr. Tyrell Antonio recommendation. Stop Plavix and continue Eliquis as prescribed.  [name] verbalized understanding and voiced appreciation for the call.

## 2022-08-22 ENCOUNTER — Telehealth: Payer: Self-pay | Admitting: Family Medicine

## 2022-08-22 ENCOUNTER — Other Ambulatory Visit: Payer: Self-pay | Admitting: Family Medicine

## 2022-08-22 NOTE — Telephone Encounter (Signed)
Please triage patient about leg swelling.  Thanks.

## 2022-08-23 ENCOUNTER — Encounter: Payer: Self-pay | Admitting: Cardiovascular Disease

## 2022-08-23 NOTE — Telephone Encounter (Signed)
Spoke with daughter to schedule appointment with Dr. Fletcher Anon. Daughter stated patient is seen in [place]. Recommended for her to call the [facility] clinic to set up an appointment.

## 2022-08-23 NOTE — Telephone Encounter (Signed)
I spoke with Jillian Oconnell (DPR signed). [name] said that pt had been having some swelling in feet and ankles for 14 days. Furosemide 20 mg daily; helps some but still swelling. No swelling in lower legs.  Swelling goes down little bit overnight but not a lot. When sitting pt does elevate legs. Today pt is feeling weak but Jillian Oconnell said that was due to taking fluid pill. SOB upon exertion but no CP. Pt saw cardiology on 08/06/22 and has FU appt on 09/04/22. Pt is on blood thinner. [name] said that pt is in and out of afib. Jillian Oconnell is going to call card this morning with update on how pt is doing and see what card would suggest. [name] ask not to DR Damita Dunnings since he is the provider that ordered furosemide for pt. [name] said the  furosemide might be old. Furosemide not on current med list. Per hx med list pt was given furosemide 20 mg #30 with one refill on 05/27/2020. Sending note to DR Damita Dunnings  and Janett Billow CMA and teams Lattie Haw who is working with Dr Damita Dunnings this morning.

## 2022-08-24 ENCOUNTER — Ambulatory Visit: Payer: Medicare HMO | Admitting: Adult Health

## 2022-08-24 NOTE — Telephone Encounter (Signed)
Noted, appreciate cards input.

## 2022-08-27 ENCOUNTER — Other Ambulatory Visit: Payer: Self-pay

## 2022-08-27 ENCOUNTER — Encounter: Payer: Self-pay | Admitting: Medical

## 2022-08-27 ENCOUNTER — Ambulatory Visit: Payer: Medicare HMO | Attending: Medical | Admitting: Medical

## 2022-08-27 VITALS — BP 122/79 | HR 114 | Ht 59.0 in | Wt 128.8 lb

## 2022-08-27 DIAGNOSIS — I639 Cerebral infarction, unspecified: Secondary | ICD-10-CM | POA: Diagnosis not present

## 2022-08-27 DIAGNOSIS — R600 Localized edema: Secondary | ICD-10-CM | POA: Diagnosis not present

## 2022-08-27 DIAGNOSIS — I480 Paroxysmal atrial fibrillation: Secondary | ICD-10-CM

## 2022-08-27 DIAGNOSIS — I471 Supraventricular tachycardia: Secondary | ICD-10-CM

## 2022-08-27 DIAGNOSIS — I493 Ventricular premature depolarization: Secondary | ICD-10-CM | POA: Diagnosis not present

## 2022-08-27 DIAGNOSIS — I10 Essential (primary) hypertension: Secondary | ICD-10-CM | POA: Diagnosis not present

## 2022-08-27 MED ORDER — FUROSEMIDE 20 MG PO TABS: 20.0000 mg | ORAL_TABLET | Freq: Every day | ORAL | 0 refills | Status: DC

## 2022-08-27 MED ORDER — AMIODARONE HCL 200 MG PO TABS: ORAL_TABLET | ORAL | 0 refills | Status: DC

## 2022-08-27 MED ORDER — APIXABAN 2.5 MG PO TABS: 2.5000 mg | ORAL_TABLET | Freq: Two times a day (BID) | ORAL | 5 refills | Status: DC

## 2022-08-27 NOTE — Progress Notes (Signed)
Cardiology Office Note:    Date:  08/28/2022   ID:  Jillian Oconnell, DOB [birth year], MRN [id number]  PCP:  Tonia Ghent, MD  [facility] Cardiologist:  Kathlyn Sacramento, MD  [facility] Electrophysiologist:  None   Referring MD: Tonia Ghent, MD   Chief Complaint: 3 week follow-up  History of Present Illness:    Jillian Oconnell is a 88 y.o. female with a hx of sinus tachycardia, frequent PVCs, nonobstructive CAD, and recently diagnosed Afib who presents for follow-up.    She had a previous cardiac cath in February 2009 which showed minor luminal irregularities with no evidence of obstutive disease. EF was normal. Echo 2017 showed normal LVSF and no significant valvular disease.    Holter monitor in October 2017 showed very frequent PVCs with a total of 34,000 beats representing 25% burden. PVCs have been well controlled with Toprol. She had an attempted repeat echocardiogram in 2021 but was not diagnostic due to poor visualization.   Admitted 05/2022 for slurred speech found to have acute CVA. She was also treated for UTI. Zio was placed at discharge. She was sent home on ASA and Plavix. Heart monitor showed normal rhythm with 41 episodes of SVT longest 12.9 seconds and rare PACs/PVCs.  Last seen 08/06/22 and she was in afib RVR with heart rate of 124bpm and was asymptomatic. She was started on Eliquis 2.'5mg'$  BID for CHADSVASC of at least 6. She was already on Toprol.   Today, the patient is still in Afib with a heart rate of 114bpm. She has lower leg edema and DOE. No chest pain. She denies missed doses of Eliquis. She denies palpitations. She is still unsure about cardioversion.   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 Minor luminal irregularities with no evidence of obstructive disease.   cataract surgery     [place]  2004   TOTAL ABDOMINAL HYSTERECTOMY      Current Medications: Current Meds  Medication Sig   albuterol (VENTOLIN HFA) 108 (90 Base) MCG/ACT inhaler Inhale 1-2 puffs into the lungs every 6 (six) hours as needed for wheezing or shortness of breath.   amiodarone (PACERONE) 200 MG tablet Take 400 mg twice a day for 2 weeks. Then take 200 mg twice a day for 2 weeks. Then take 200 mg daily.   amLODipine (NORVASC) 5 MG tablet Take 1 tablet (5 mg total) by mouth daily.   Calcium Carbonate-Vitamin D 600-400 MG-UNIT tablet Take  1 tablet by mouth two times a day   fluticasone (FLONASE) 50 MCG/ACT nasal spray Place 2 sprays into both nostrils daily.   Fluticasone Propionate, Inhal, (FLOVENT DISKUS) 250 MCG/BLIST AEPB INHALE 2 PUFFS IN THE MORNING AND INHALE 2 PUFFS IN THE EVENING (RINSE AFTER USE)   HYDROcodone-acetaminophen (NORCO) 7.5-325 MG tablet Take 1 tablet by mouth 3 (three) times daily as needed (for pain.). Fill on/after 08/28/22   HYDROcodone-acetaminophen (NORCO) 7.5-325 MG tablet Take 1 tablet by mouth 3 (three) times daily as needed (for pain. fill on/after 09/27/22).   lansoprazole (PREVACID) 15 MG capsule Take 1 capsule by mouth once daily   levothyroxine (SYNTHROID) 25 MCG tablet Take 1 tablet (25 mcg total) by mouth daily.   LORazepam (ATIVAN) 1 MG tablet TAKE 1/2  TABLET BY MOUTH TWICE DAILY AS NEEDED (SEDATION CAUTION)   metoprolol succinate (TOPROL-XL) 100 MG 24 hr tablet TAKE 1 TABLET TWICE DAILY WITH OR IMMEDIATELY FOLLOWING A MEAL   montelukast (SINGULAIR) 10 MG tablet TAKE 1 TABLET AT BEDTIME   [DISCONTINUED] apixaban (ELIQUIS) 2.5 MG TABS tablet Take 1 tablet (2.5 mg total) by mouth 2 (two) times daily.   [DISCONTINUED] furosemide (LASIX) 20 MG tablet Take 20 mg by mouth daily.     Allergies:   Albuterol, Alendronate sodium, Amoxicillin-pot clavulanate, Atorvastatin, Azithromycin, Ezetimibe-simvastatin, Gabapentin, Ibandronate sodium, Lexapro [escitalopram oxalate], Lyrica [pregabalin], Neosporin [bacitracin-polymyxin b], Risedronate sodium, Rosuvastatin, Sertraline, and Sulfonamide derivatives   Social History   Socioeconomic History   Marital status: Widowed    Spouse name: Not on file   Number of children: Not on file   Years of education: Not on file   Highest education level: Not on file  Occupational History   Occupation: Retired    Fish farm manager: retired    Comment: Homemaker  Tobacco Use   Smoking status: Never   Smokeless tobacco: Never  Scientific laboratory technician Use: Never used  Substance and Sexual Activity   Alcohol use: No    Alcohol/week: 0.0 standard drinks of alcohol   Drug use: No   Sexual activity: Not Currently    Birth control/protection: Post-menopausal  Other Topics Concern   Not on file  Social History Narrative   Regular exercise- no    Moved here from [place]- to be closer to family    Husband died of lung CA 03/18/13   Social Determinants of Health   Financial Resource Strain: Low Risk  (12/04/2021)   Overall Financial Resource Strain (CARDIA)    Difficulty of Paying Living Expenses: Not hard at all  Food Insecurity: No Food Insecurity (12/04/2021)   Hunger Vital Sign    Worried About Running Out of Food in the Last Year: Never true    [place] in the Last Year: Never true  Transportation Needs: No  Transportation Needs (12/04/2021)   PRAPARE - Hydrologist (Medical): No    Lack of Transportation (Non-Medical): No  Physical Activity: Inactive (12/04/2021)   Exercise Vital Sign    Days of Exercise per Week: 0 days    Minutes of Exercise per Session: 0 min  Stress: No Stress Concern Present (12/04/2021)   [place]    Feeling of Stress : Not at all  Social Connections: Socially Isolated (12/04/2021)   Social Connection and Isolation Panel [NHANES]    Frequency of Communication with Friends and Family: More than three times a week    Frequency of  Social Gatherings with Friends and Family: More than three times a week    Attends Religious Services: Never    Marine scientist or Organizations: No    Attends Archivist Meetings: Never    Marital Status: Widowed     Family History: [name] family history includes Aneurysm in her father; Anxiety disorder in her daughter; Breast cancer in her daughter; Breast cancer (age of onset: 85) in her daughter; Breast cancer (age of onset: 2) in her mother; Cancer in her daughter and sister; Colon cancer in her sister; Hypertension in her father and mother.  ROS:   Please see the history of present illness.     All other systems reviewed and are negative.  EKGs/Labs/Other Studies Reviewed:    The following studies were reviewed today:   Heart monitor 06/2022 Study Highlights     Patch Wear Time:  13 days and 18 hours (2023-06-24T13:41:02-399 to 2023-07-08T07:48:07-399)   Patient had a min HR of 59 bpm, max HR of 156 bpm, and avg HR of 74 bpm. Predominant underlying rhythm was Sinus Rhythm.  41 Supraventricular Tachycardia runs occurred, the run with the fastest interval lasting 5 beats with a max rate of 156 bpm, the  longest lasting 12.9 secs with an avg rate of 115 bpm. Rare PACs and rare PVCs.     Echo 05/2022  1.  Left ventricular ejection fraction, by estimation, is 60 to 65%. The  left ventricle has normal function. The left ventricle has no regional  wall motion abnormalities. Left ventricular diastolic function could not  be evaluated.   2. Right ventricular systolic function is normal. The right ventricular  size is not well visualized.   3. The mitral valve is degenerative. No evidence of mitral valve  regurgitation. Severe mitral annular calcification.   4. The aortic valve is calcified. Aortic valve regurgitation is not  visualized. Aortic valve sclerosis/calcification is present, without any  evidence of aortic stenosis.   5. The inferior vena cava is normal in size with greater than 50%  respiratory variability, suggesting right atrial pressure of 3 mmHg.   EKG:  EKG is ordered today.  The ekg ordered today demonstrates  Afib, 114bpm, poor EKG quality  Recent Labs: 06/05/2022: ALT 16; B Natriuretic Peptide 410.1; Magnesium 1.9; TSH 2.173 06/14/2022: Hemoglobin 14.0; Platelets 273.0 06/21/2022: BUN 16; Creatinine, Ser 1.21; Potassium 4.1; Sodium 138  Recent Lipid Panel    Component Value Date/Time   CHOL 166 06/06/2022 0427   TRIG 60 06/06/2022 0427   HDL 64 06/06/2022 0427   CHOLHDL 2.6 06/06/2022 0427   VLDL 12 06/06/2022 0427   LDLCALC 90 06/06/2022 0427   LDLCALC 116 (H) 08/07/2019 1507   LDLDIRECT 148.1 01/06/2013 1157    Physical Exam:    VS:  BP 122/79 (BP Location: Right Arm, Patient Position: Sitting, Cuff Size: Normal)   Pulse (!) 114   Ht '4\' 11"'$  (1.499 m)   Wt 128 lb 12.8 oz (58.4 kg)   SpO2 96%   BMI 26.01 kg/m     Wt Readings from Last 3 Encounters:  08/27/22 128 lb 12.8 oz (58.4 kg)  08/06/22 129 lb (58.5 kg)  07/05/22 124 lb (56.2 kg)     GEN:  Well nourished, well developed in no acute distress HEENT: Normal NECK: No JVD; No carotid bruits LYMPHATICS: No lymphadenopathy CARDIAC: Irreg Irreg, tachy, no murmurs, rubs, gallops RESPIRATORY:  Clear to  auscultation without rales, wheezing or rhonchi  ABDOMEN: Soft,  non-tender, non-distended MUSCULOSKELETAL:  1-2+ lower leg edema; No deformity  SKIN: Warm and dry NEUROLOGIC:  Alert and oriented x 3 PSYCHIATRIC:  Normal affect   ASSESSMENT:    1. Paroxysmal atrial fibrillation (HCC)   2. Lower leg edema   3. Cerebrovascular accident (CVA), unspecified mechanism ([place])   4. Symptomatic PVCs   5. Essential hypertension   6. Paroxysmal SVT (supraventricular tachycardia) (HCC)    PLAN:    In order of problems listed above:  Rapid Afib EKG today shows patient is still in Afib with  a rate of 114bpm. She reported lower leg edema and some DOE. Exam showed 1+ lower leg edema. She has been on Eliquis 2.'5mg'$  BID for 3 weeks. She denies missing doses. Patient is not wanting to undergo TEE/DCCV. I will start amiodarone '400mg'$  BID x 7 days, '200mg'$  x 7 days, '200mg'$  daily thereafter. She is also on Toprol '100mg'$  BID. We will see her back in 2-3 weeks.   Lower leg edema Patient reports LLE and DOE, suspected exacerbated by rapid afib. I will give lasix '20mg'$  to use as needed. I instructed her to take it for a week.   CVA 05/2022 Patient denies residual weakness. Afib detected at the previous office visit as above. Plavix was stopped due to Eliquis. She will continue to follow with neurology.   Symptomatic PVCs pSVT Continue Toprol '100mg'$  BID.   HTN BP is good today. Continue Toprol and amlodipine '5mg'$  daily.  Disposition: Follow up  in 2-3 weeks  with MD/APP    Signed, Cariana Karge Ninfa Meeker, PA-C  08/28/2022 12:32 PM    [facility]

## 2022-08-27 NOTE — Telephone Encounter (Signed)
Prescription refill request for Eliquis received. Indication: PAF Last office visit: 08/27/22  C Furth PA-C Scr: 1.21 on 06/21/22 Age: 88 Weight: 58.4kg  Based on above findings Eliquis 2.'5mg'$  twice daily is the appropriate dose.  Refill approved.

## 2022-08-27 NOTE — Patient Instructions (Signed)
Medication Instructions:  Your physician has recommended you make the following change in your medication:   START Amiodarone as instructed below: Take 400 mg twice a day for 2 weeks. Then take 200 mg twice a day for 2 weeks. Then take 200 mg daily.  START Lasix 20 mg daily for 1 week for swelling.  *If you need a refill on your cardiac medications before your next appointment, please call your pharmacy*   Lab Work: None ordered If you have labs (blood work) drawn today and your tests are completely normal, you will receive your results only by: [place] (if you have MyChart) OR A paper copy in the mail If you have any lab test that is abnormal or we need to change your treatment, we will call you to review the results.   Testing/Procedures: None ordered   Follow-Up: At [facility], you and your health needs are our priority.  As part of our continuing mission to provide you with exceptional heart care, we have created designated Provider Care Teams.  These Care Teams include your primary Cardiologist (physician) and Advanced Practice Providers (APPs -  Physician Assistants and Nurse Practitioners) who all work together to provide you with the care you need, when you need it.  We recommend signing up for the patient portal called "MyChart".  Sign up information is provided on this After Visit Summary.  MyChart is used to connect with patients for Virtual Visits (Telemedicine).  Patients are able to view lab/test results, encounter notes, upcoming appointments, etc.  Non-urgent messages can be sent to your provider as well.   To learn more about what you can do with MyChart, go to [email or web address].    Your next appointment:   2 week(s)  The format for your next appointment:   In Person  Provider:   You may see Kathlyn Sacramento, MD or one of the following Advanced Practice Providers on your designated Care Team:   Murray Hodgkins, NP Christell Faith,  PA-C Cadence Kathlen Mody, PA-C Gerrie Nordmann, NP   Other Instructions N/A  Important Information About Sugar

## 2022-08-30 ENCOUNTER — Telehealth: Payer: Self-pay | Admitting: Medical

## 2022-08-30 NOTE — Telephone Encounter (Signed)
STAT if HR is under 50 or over 120 (normal HR is 60-100 beats per minute)  What is your heart rate? 63  Do you have a log of your heart rate readings (document readings)? No   Do you have any other symptoms? Dizzy; lightheaded; pre-syncope

## 2022-08-30 NOTE — Telephone Encounter (Signed)
Pts daughter called to report that the pt was experiencing dizziness and feeling to pass out when she walks in the house.. she did get up and make her own breakfast and eat but hs not moved much from the couch since she has been dizzy.. she is able to do her bible study with he grandson and she normally does... her daughter is not sure if it is because she is feeling a little dizzy that she is afraid to walk on her own...   She denies SOB, headache, palpitations, chest pain.   She was seen 08/27/22 and started on Amiodarone... her heart rate and BP today is 121/72 and HR 63.   I have advised her daughter to continue to monitor her... she will call EMS and have them assess her if needed if her symptoms worsen or change.   I will forward to the provider for review and any further recommendations.

## 2022-08-31 NOTE — Telephone Encounter (Signed)
Left message with Lynden Ang to return call to discuss response from PA

## 2022-09-03 NOTE — Telephone Encounter (Signed)
Reviewed provider comments with daughter. She states that her mother no longer has any dizziness and is feeling well. Confirmed upcoming appointment. She verbalized understanding with no further questions at this time.

## 2022-09-04 ENCOUNTER — Ambulatory Visit: Payer: Medicare HMO | Admitting: Medical

## 2022-09-05 ENCOUNTER — Telehealth: Payer: Self-pay | Admitting: Family Medicine

## 2022-09-05 MED ORDER — ONDANSETRON HCL 4 MG PO TABS: 4.0000 mg | ORAL_TABLET | Freq: Three times a day (TID) | ORAL | 0 refills | Status: AC | PRN

## 2022-09-05 NOTE — Addendum Note (Signed)
Addended by: Tonia Ghent on: 09/05/2022 04:49 PM   Modules accepted: Orders

## 2022-09-05 NOTE — Telephone Encounter (Signed)
Called and spoke with patients daughter; okay per DPR. Patient was started on pacerone 200 mg tabs on 08/27/22. Patient is taking 2 tabs twice a week currently then will go down to 1 tab twice a day then 1 tab QD. Patient has had a lot of nausea and burping a lot x couple of days. [name] didn't know if it was because of the new medication or not. Can she get something to calm her stomach down?

## 2022-09-05 NOTE — Telephone Encounter (Signed)
Patients daughter vickie notified rx was sent.

## 2022-09-05 NOTE — Telephone Encounter (Signed)
Patient daughter Jillian Oconnell called in stated that Jillian Oconnell has been feeling nauseous and wasn't sure if it was due to medications. She wondering if something could be sent on for the nausea. Please advise. Thank you!

## 2022-09-05 NOTE — Telephone Encounter (Signed)
Would try zofran prn for nausea, along with prn tums.  Rx sent for zofran.  Can get otc tums.  Thanks.

## 2022-09-11 ENCOUNTER — Telehealth: Payer: Self-pay | Admitting: Cardiovascular Disease

## 2022-09-11 NOTE — Telephone Encounter (Signed)
Recommend knee-high support stockings to be used during the day.

## 2022-09-11 NOTE — Telephone Encounter (Signed)
Spoke with pt daughter, she reports there has not been much change in the patients swelling after taking the furosemide for one week. She does report they are down pretty good this morning but by the afternoon they will be swollen. She does watch her salt and fluids and she does elevate her legs. They are wondering if some of her medications could be causing the swelling. She is not having any SOB. They have not tried support hose. Encouraged daughter to start weighing the patient every morning as that will help them know when to use the furosemide. Aware will forward the message to dr Fletcher Anon and cadiance for further recommendations.

## 2022-09-11 NOTE — Telephone Encounter (Signed)
Spoke with vickie, aware of dr Tyrell Antonio recommendations.

## 2022-09-11 NOTE — Telephone Encounter (Signed)
Pt c/o swelling: STAT is pt has developed SOB within 24 hours  If swelling, where is the swelling located? Legs and feet   How much weight have you gained and in what time span? Has not taken   Have you gained 3 pounds in a day or 5 pounds in a week? Has not taken   Do you have a log of your daily weights (if so, list)? Has not taken   Are you currently taking a fluid pill? Not everyday day   Are you currently SOB? No   Have you traveled recently? No    Pt daughter called about pt legs and feet swelling. She states she was told to only take lasix once a day for a week so she hasn't been taking it everyday. Pt daughter denies any SOB. She states pt is concerned that maybe some of the medications she's on is causing her swelling.

## 2022-09-15 ENCOUNTER — Other Ambulatory Visit: Payer: Self-pay | Admitting: Family

## 2022-09-15 DIAGNOSIS — I270 Primary pulmonary hypertension: Secondary | ICD-10-CM

## 2022-09-16 ENCOUNTER — Other Ambulatory Visit: Payer: Self-pay | Admitting: Medical

## 2022-09-18 ENCOUNTER — Telehealth: Payer: Self-pay | Admitting: Cardiovascular Disease

## 2022-09-18 NOTE — Telephone Encounter (Signed)
Pt c/o swelling: STAT is pt has developed SOB within 24 hours  If swelling, where is the swelling located? Left leg  How much weight have you gained and in what time span? NA  Have you gained 3 pounds in a day or 5 pounds in a week? NA  Do you have a log of your daily weights (if so, list)? No  Are you currently taking a fluid pill? Yes  Are you currently SOB? No  Have you traveled recently? No  Pt's daughter states that pt's legs are now weeping. Daughter would like a callback regarding this matter. Please advise

## 2022-09-18 NOTE — Telephone Encounter (Signed)
Spoke with patients daughter and she reviewed their concerns. Patient has some swelling and now her leg is weeping. Inquired if she was wearing compression socks and she said yes until she started weeping. Now she has been using some bandages and wrapping her legs. Encouraged her to have patient keep legs elevated when not walking and to keep appointment on Friday to see if we need to make any changes. She verbalized understanding, agreement with plan, and had no further questions at this time.

## 2022-09-21 ENCOUNTER — Encounter: Payer: Self-pay | Admitting: Medical

## 2022-09-21 ENCOUNTER — Ambulatory Visit: Payer: Medicare HMO | Attending: Medical | Admitting: Medical

## 2022-09-21 VITALS — BP 140/82 | HR 69 | Ht <= 58 in | Wt 121.8 lb

## 2022-09-21 DIAGNOSIS — I471 Supraventricular tachycardia, unspecified: Secondary | ICD-10-CM | POA: Diagnosis not present

## 2022-09-21 DIAGNOSIS — I639 Cerebral infarction, unspecified: Secondary | ICD-10-CM

## 2022-09-21 DIAGNOSIS — I493 Ventricular premature depolarization: Secondary | ICD-10-CM | POA: Diagnosis not present

## 2022-09-21 DIAGNOSIS — I480 Paroxysmal atrial fibrillation: Secondary | ICD-10-CM | POA: Diagnosis not present

## 2022-09-21 DIAGNOSIS — R600 Localized edema: Secondary | ICD-10-CM | POA: Diagnosis not present

## 2022-09-21 DIAGNOSIS — I10 Essential (primary) hypertension: Secondary | ICD-10-CM | POA: Diagnosis not present

## 2022-09-21 MED ORDER — AMIODARONE HCL 200 MG PO TABS: ORAL_TABLET | ORAL | 6 refills | Status: DC

## 2022-09-21 MED ORDER — FUROSEMIDE 20 MG PO TABS: ORAL_TABLET | ORAL | 0 refills | Status: DC

## 2022-09-21 NOTE — Progress Notes (Signed)
Cardiology Office Note:    Date:  09/23/2022   ID:  [name], DOB [birth year], MRN [id number]  PCP:  Tonia Ghent, MD  [facility] Cardiologist:  Kathlyn Sacramento, MD  [facility] Electrophysiologist:  None   Referring MD: Tonia Ghent, MD   Chief Complaint: 2-3 week follow-up  History of Present Illness:    Jillian Oconnell is a 88 y.o. female with a hx of sinus tachycardia, frequent PVCs, nonobstructive CAD, and recently diagnosed Afib who presents for follow-up.    She had a previous cardiac cath in February 2009 which showed minor luminal irregularities with no evidence of obstutive disease. EF was normal. Echo 2017 showed normal LVSF and no significant valvular disease.    Holter monitor in October 2017 showed very frequent PVCs with a total of 34,000 beats representing 25% burden. PVCs have been well controlled with Toprol. She had an attempted repeat echocardiogram in 2021 but was not diagnostic due to poor visualization.   Admitted 05/2022 for slurred speech found to have acute CVA. She was also treated for UTI. Zio was placed at discharge. She was sent home on ASA and Plavix. Heart monitor showed normal rhythm with 41 episodes of SVT longest 12.9 seconds and rare PACs/PVCs.   Seen 08/06/22 and she was in afib RVR with heart rate of 124bpm and was asymptomatic. She was started on Eliquis 2.'5mg'$  BID for CHADSVASC of at least 6. She was already on Toprol.   Last seen 08/27/22 and was still in Afib with rate 114bpm. Patient was not wanting to puruse TEE/DCCV, so she was started on amiodarone load and subsequent daily dose. Lower leg edema was present and the patient was given lasix '20mg'$  to use as needed.   Today, the patient is in NSR. She is taking amiodarone '200mg'$  BID, she needs a script for '200mg'$  daily. She felt herself go back into normal rhythm after a couple days of starting amiodarone. She has some nasuea and PCP gave her Zofran. She is eating most days. She denies  chest pain. She has chornic unchanged SOB. She reports lower leg edema. She has not been taking lasix every day.   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 Minor luminal irregularities with no evidence of obstructive disease.   cataract surgery     [place]  2004   TOTAL ABDOMINAL HYSTERECTOMY      Current Medications: Current Meds  Medication Sig   albuterol (VENTOLIN HFA) 108 (  90 Base) MCG/ACT inhaler Inhale 1-2 puffs into the lungs every 6 (six) hours as needed for wheezing or shortness of breath.   apixaban (ELIQUIS) 2.5 MG TABS tablet Take 1 tablet (2.5 mg total) by mouth 2 (two) times daily.   Calcium Carbonate-Vitamin D 600-400 MG-UNIT tablet Take 1 tablet by mouth 2 (two) times daily. Take 1 tablet by mouth two times a day   fluticasone (FLONASE) 50 MCG/ACT nasal spray Place 2 sprays into both nostrils daily.   Fluticasone Propionate, Inhal, (FLOVENT DISKUS) 250 MCG/BLIST AEPB INHALE 2 PUFFS IN THE MORNING AND INHALE 2 PUFFS IN THE EVENING (RINSE AFTER USE) (Patient taking differently: Inhale 2 puffs into the lungs 2 (two) times daily. INHALE 2 PUFFS IN THE MORNING AND INHALE 2 PUFFS IN THE EVENING (RINSE AFTER  USE))   HYDROcodone-acetaminophen (NORCO) 7.5-325 MG tablet Take 1 tablet by mouth 3 (three) times daily as needed (for pain.). Fill on/after 08/28/22 (Patient taking differently: Take 1 tablet by mouth 3 (three) times daily as needed for moderate pain or severe pain (for pain.). Fill on/after 08/28/22)   HYDROcodone-acetaminophen (NORCO) 7.5-325 MG tablet Take 1 tablet by mouth 3 (three) times daily as needed (for pain. fill on/after 09/27/22). (Patient taking differently: Take 1 tablet by mouth 3 (three) times daily as needed for moderate pain or severe pain (for pain. fill on/after 09/27/22).)   lansoprazole (PREVACID) 15 MG capsule Take 1 capsule by mouth once daily (Patient taking differently: Take 15 mg by mouth daily at 12 noon.)   levothyroxine (SYNTHROID) 25 MCG tablet Take 1 tablet (25 mcg total) by mouth daily.   LORazepam (ATIVAN) 1 MG tablet TAKE 1/2 TABLET BY MOUTH TWICE DAILY AS NEEDED (SEDATION CAUTION) (Patient taking differently: Take 0.5 mg by mouth 2 (two) times daily as needed for sedation. TAKE 1/2 TABLET BY MOUTH TWICE DAILY AS NEEDED (SEDATION CAUTION))   metoprolol succinate (TOPROL-XL) 100 MG 24 hr tablet TAKE 1 TABLET TWICE DAILY WITH OR IMMEDIATELY FOLLOWING A MEAL (Patient taking differently: Take 100 mg by mouth daily.)   montelukast (SINGULAIR) 10 MG tablet TAKE 1 TABLET AT BEDTIME (Patient taking differently: Take 10 mg by mouth at bedtime.)   ondansetron (ZOFRAN) 4 MG tablet Take 1 tablet (4 mg total) by mouth every 8 (eight) hours as needed for nausea or vomiting.   [DISCONTINUED] amiodarone (PACERONE) 200 MG tablet Take 400 mg twice a day for 2 weeks. Then take 200 mg twice a day for 2 weeks. Then take 200 mg daily. (Patient taking differently: Take 200 mg by mouth daily. Take 400 mg twice a day for 2 weeks. Then take 200 mg twice a day for 2 weeks. Then take 200 mg daily.)   [DISCONTINUED] amLODipine (NORVASC) 5 MG tablet Take 1 tablet by mouth once daily   [DISCONTINUED]  furosemide (LASIX) 20 MG tablet TAKE 1 TABLET BY MOUTH ONCE DAILY FOR  7  DAYS  (FOR  SWELLING) (Patient taking differently: Take 20 mg by mouth daily. Christian)     Allergies:   Albuterol, Alendronate sodium, Amoxicillin-pot clavulanate, Atorvastatin, Azithromycin, Ezetimibe-simvastatin, Gabapentin, Ibandronate sodium, Lexapro [escitalopram oxalate], Lyrica [pregabalin], Neosporin [bacitracin-polymyxin b], Risedronate sodium, Rosuvastatin, Sertraline, and Sulfonamide derivatives   Social History   Socioeconomic History   Marital status: Widowed    Spouse name: Not on file   Number of children: Not on file   Years of education: Not on file   Highest education level: Not on file  Occupational History   Occupation:  Retired    Fish farm manager: retired    Comment: Agricultural engineer  Tobacco Use   Smoking status: Never   Smokeless tobacco: Never  Vaping Use   Vaping Use: Never used  Substance and Sexual Activity   Alcohol use: No    Alcohol/week: 0.0 standard drinks of alcohol   Drug use: No   Sexual activity: Not Currently    Birth control/protection: Post-menopausal  Other Topics Concern   Not on file  Social History Narrative   Regular exercise- no    Moved here from [place]- to be closer to family    Husband died of lung CA 03/19/13   Social Determinants of Health   Financial Resource Strain: Low Risk  (12/04/2021)   Overall Financial Resource Strain (CARDIA)    Difficulty of Paying Living Expenses: Not hard at all  Food Insecurity: No Food Insecurity (12/04/2021)   Hunger Vital Sign    Worried About Running Out of Food in the Last Year: Never true    Ran Out of Food in the Last Year: Never true  Transportation Needs: No Transportation Needs (12/04/2021)   PRAPARE - Hydrologist (Medical): No    Lack of Transportation (Non-Medical): No  Physical Activity: Inactive (12/04/2021)   Exercise Vital Sign    Days of Exercise per Week: 0 days    Minutes of Exercise per  Session: 0 min  Stress: No Stress Concern Present (12/04/2021)   [place]    Feeling of Stress : Not at all  Social Connections: Socially Isolated (12/04/2021)   Social Connection and Isolation Panel [NHANES]    Frequency of Communication with Friends and Family: More than three times a week    Frequency of Social Gatherings with Friends and Family: More than three times a week    Attends Religious Services: Never    Marine scientist or Organizations: No    Attends Archivist Meetings: Never    Marital Status: Widowed     Family History: [name] family history includes Aneurysm in her father; Anxiety disorder in her daughter; Breast cancer in her daughter; Breast cancer (age of onset: 26) in her daughter; Breast cancer (age of onset: 3) in her mother; Cancer in her daughter and sister; Colon cancer in her sister; Hypertension in her father and mother.  ROS:   Please see the history of present illness.     All other systems reviewed and are negative.  EKGs/Labs/Other Studies Reviewed:    The following studies were reviewed today:  Heart monitor 06/2022 Study Highlights     Patch Wear Time:  13 days and 18 hours (2023-06-24T13:41:02-399 to 2023-07-08T07:48:07-399)   Patient had a min HR of 59 bpm, max HR of 156 bpm, and avg HR of 74 bpm. Predominant underlying rhythm was Sinus Rhythm.  41 Supraventricular Tachycardia runs occurred, the run with the fastest interval lasting 5 beats with a max rate of 156 bpm, the  longest lasting 12.9 secs with an avg rate of 115 bpm. Rare PACs and rare PVCs.     Echo 05/2022  1. Left ventricular ejection fraction, by estimation, is 60 to 65%. The  left ventricle has normal function. The left ventricle has no regional  wall motion abnormalities. Left ventricular diastolic function could not  be evaluated.   2. Right ventricular systolic function is  normal. The right ventricular  size is not well visualized.   3. The mitral  valve is degenerative. No evidence of mitral valve  regurgitation. Severe mitral annular calcification.   4. The aortic valve is calcified. Aortic valve regurgitation is not  visualized. Aortic valve sclerosis/calcification is present, without any  evidence of aortic stenosis.   5. The inferior vena cava is normal in size with greater than 50%  respiratory variability, suggesting right atrial pressure of 3 mmHg.   EKG:  EKG is  ordered today.  The ekg ordered today demonstrates NSR 69bpm, poor baseline, no ischemic changes  Recent Labs: 06/05/2022: ALT 16; B Natriuretic Peptide 410.1; Magnesium 1.9; TSH 2.173 06/14/2022: Hemoglobin 14.0; Platelets 273.0 06/21/2022: BUN 16; Creatinine, Ser 1.21; Potassium 4.1; Sodium 138  Recent Lipid Panel    Component Value Date/Time   CHOL 166 06/06/2022 0427   TRIG 60 06/06/2022 0427   HDL 64 06/06/2022 0427   CHOLHDL 2.6 06/06/2022 0427   VLDL 12 06/06/2022 0427   LDLCALC 90 06/06/2022 0427   LDLCALC 116 (H) 08/07/2019 1507   LDLDIRECT 148.1 01/06/2013 1157     Physical Exam:    VS:  BP (!) 140/82   Pulse 69   Ht '4\' 9"'$  (1.448 m)   Wt 121 lb 12.8 oz (55.2 kg)   SpO2 93%   BMI 26.36 kg/m     Wt Readings from Last 3 Encounters:  09/21/22 121 lb 12.8 oz (55.2 kg)  08/27/22 128 lb 12.8 oz (58.4 kg)  08/06/22 129 lb (58.5 kg)     GEN:  Well nourished, well developed in no acute distress HEENT: Normal NECK: No JVD; No carotid bruits LYMPHATICS: No lymphadenopathy CARDIAC: RRR, no murmurs, rubs, gallops RESPIRATORY:  Clear to auscultation without rales, wheezing or rhonchi  ABDOMEN: Soft, non-tender, non-distended MUSCULOSKELETAL:  No edema; No deformity  SKIN: Warm and dry NEUROLOGIC:  Alert and oriented x 3 PSYCHIATRIC:  Normal affect   ASSESSMENT:    1. Paroxysmal atrial fibrillation (HCC)   2. Lower leg edema   3. Cerebrovascular accident (CVA),  unspecified mechanism ([place])   4. Symptomatic PVCs   5. Paroxysmal SVT (supraventricular tachycardia)   6. Essential hypertension    PLAN:    In order of problems listed above:  Paroxysmal Afib She is in NSR with heart rate of 69bpm. She is taking amiodarone '200mg'$ BID, she needs a refill for '200mg'$  daily, so this will be sent in. She is also on Toprol '100mg'$  BID, I recommended they continue to monitor heart rate. Continue Eliquis 2.'5mg'$  BID for stroke ppx.   Lower leg edema She reports lower leg edema. They said the patient was started on amlodipine by another provider for elevated BP. I will stop amlodipine. She has lasix '20mg'$  to take as needed, however is rarely taking it. We will evaluate lower leg edema at follow-up.   CVA 05/2022 No residual weakness. Continue Eliquis  Symptomatic PVCs pSVT No symptoms reported. Continue Toprol '100mg'$  BID.   HTN Started on amlodipine by another provider, however will stop for LLE. They will call to confirm amlodipine is the new medication. Can start another antihypertensive at follow-up. Continue Toprol '100mg'$  BID.   Disposition: Follow up in 2 week(s) with MD/APP     Signed, Telicia Hodgkiss Ninfa Meeker, PA-C  09/23/2022 9:13 PM    [facility]

## 2022-09-21 NOTE — Patient Instructions (Addendum)
Medication Instructions:  - Your physician has recommended you make the following change in your medication:   1) STOP Amlodipine if you are taking this  Please call to confirm if you are actually taking this [phone] or send a MyChart message  *If you need a refill on your cardiac medications before your next appointment, please call your pharmacy*   Lab Work: - none ordered  If you have labs (blood work) drawn today and your tests are completely normal, you will receive your results only by: Friend (if you have MyChart) OR A paper copy in the mail If you have any lab test that is abnormal or we need to change your treatment, we will call you to review the results.   Testing/Procedures: - none ordered   Follow-Up: At [facility], you and your health needs are our priority.  As part of our continuing mission to provide you with exceptional heart care, we have created designated Provider Care Teams.  These Care Teams include your primary Cardiologist (physician) and Advanced Practice Providers (APPs -  Physician Assistants and Nurse Practitioners) who all work together to provide you with the care you need, when you need it.  We recommend signing up for the patient portal called "MyChart".  Sign up information is provided on this After Visit Summary.  MyChart is used to connect with patients for Virtual Visits (Telemedicine).  Patients are able to view lab/test results, encounter notes, upcoming appointments, etc.  Non-urgent messages can be sent to your provider as well.   To learn more about what you can do with MyChart, go to [email or web address].    Your next appointment:   2-4 week(s)  The format for your next appointment:   In Person  Provider:   You may see Kathlyn Sacramento, MD or one of the following Advanced Practice Providers on your designated Care Team:    Cadence Kathlen Mody, [place]   Other Instructions N/a  Important Information About  Sugar

## 2022-09-26 ENCOUNTER — Encounter: Payer: Self-pay | Admitting: Family Medicine

## 2022-09-28 ENCOUNTER — Other Ambulatory Visit: Payer: Self-pay | Admitting: Family Medicine

## 2022-09-28 MED ORDER — MUPIROCIN 2 % EX OINT: TOPICAL_OINTMENT | Freq: Two times a day (BID) | CUTANEOUS | 0 refills | Status: DC

## 2022-09-28 MED ORDER — MUPIROCIN CALCIUM 2 % EX CREA
1.0000 | TOPICAL_CREAM | Freq: Two times a day (BID) | CUTANEOUS | Status: DC
Start: 2022-09-28 — End: 2022-09-28

## 2022-10-03 ENCOUNTER — Institutional Professional Consult (permissible substitution): Payer: Medicare HMO | Admitting: Cardiology

## 2022-10-19 ENCOUNTER — Ambulatory Visit: Payer: Medicare HMO | Admitting: Medical

## 2022-10-22 ENCOUNTER — Encounter: Payer: Self-pay | Admitting: Medical

## 2022-10-22 ENCOUNTER — Ambulatory Visit: Payer: Medicare HMO | Attending: Medical | Admitting: Medical

## 2022-10-22 VITALS — BP 150/77 | HR 65 | Ht <= 58 in | Wt 116.4 lb

## 2022-10-22 DIAGNOSIS — I639 Cerebral infarction, unspecified: Secondary | ICD-10-CM | POA: Diagnosis not present

## 2022-10-22 DIAGNOSIS — I493 Ventricular premature depolarization: Secondary | ICD-10-CM

## 2022-10-22 DIAGNOSIS — Z79899 Other long term (current) drug therapy: Secondary | ICD-10-CM

## 2022-10-22 DIAGNOSIS — I10 Essential (primary) hypertension: Secondary | ICD-10-CM

## 2022-10-22 DIAGNOSIS — R600 Localized edema: Secondary | ICD-10-CM

## 2022-10-22 DIAGNOSIS — I480 Paroxysmal atrial fibrillation: Secondary | ICD-10-CM

## 2022-10-22 MED ORDER — LOSARTAN POTASSIUM 25 MG PO TABS: 25.0000 mg | ORAL_TABLET | Freq: Every day | ORAL | 1 refills | Status: DC

## 2022-10-22 NOTE — Patient Instructions (Signed)
Medication Instructions:  - Your physician has recommended you make the following change in your medication:   1) START Losartan 25 mg: - take 1 tablet by mouth once daily    *If you need a refill on your cardiac medications before your next appointment, please call your pharmacy*   Lab Work: - Your physician recommends that you return for lab work in: 2 weeks (around 11/05/22)  [place] Entrance at [facility] 1st desk on the right to check in (REGISTRATION)  Lab hours: Monday- Friday (7:30 am- 5:30 pm)   If you have labs (blood work) drawn today and your tests are completely normal, you will receive your results only by: MyChart Message (if you have MyChart) OR A paper copy in the mail If you have any lab test that is abnormal or we need to change your treatment, we will call you to review the results.   Testing/Procedures: - none ordered   Follow-Up: At [facility], you and your health needs are our priority.  As part of our continuing mission to provide you with exceptional heart care, we have created designated Provider Care Teams.  These Care Teams include your primary Cardiologist (physician) and Advanced Practice Providers (APPs -  Physician Assistants and Nurse Practitioners) who all work together to provide you with the care you need, when you need it.  We recommend signing up for the patient portal called "MyChart".  Sign up information is provided on this After Visit Summary.  MyChart is used to connect with patients for Virtual Visits (Telemedicine).  Patients are able to view lab/test results, encounter notes, upcoming appointments, etc.  Non-urgent messages can be sent to your provider as well.   To learn more about what you can do with MyChart, go to [email or web address].    Your next appointment:   3 month(s)  The format for your next appointment:   In Person  Provider:   You may see Kathlyn Sacramento, MD or one of the following Advanced  Practice Providers on your designated Care Team:    Cadence Kathlen Mody, [place]   Other Instructions  Losartan Tablets What is this medication? LOSARTAN (loe SAR tan) treats high blood pressure. It may also be used to prevent a stroke in people with heart disease and high blood pressure. It can be used to prevent kidney damage in people with diabetes. It works by relaxing the blood vessels, which helps decrease the amount of work your heart has to do. It belongs to a group of medications called ARBs. This medicine may be used for other purposes; ask your health care provider or pharmacist if you have questions. COMMON BRAND NAME(S): Cozaar What should I tell my care team before I take this medication? They need to know if you have any of these conditions: Heart failure Kidney disease Liver disease An unusual or allergic reaction to losartan, other medications, foods, dyes, or preservatives Pregnant or trying to get pregnant Breast-feeding How should I use this medication? Take this medication by mouth. Take it as directed on the prescription label at the same time every day. You can take it with or without food. If it upsets your stomach, take it with food. Keep taking it unless your care team tells you to stop. Talk to your care team about the use of this medication in children. While it may be prescribed for children as young as 6 for selected conditions, precautions do apply. Overdosage: If you think you have taken too much  of this medicine contact a poison control center or emergency room at once. NOTE: This medicine is only for you. Do not share this medicine with others. What if I miss a dose? If you miss a dose, take it as soon as you can. If it is almost time for your next dose, take only that dose. Do not take double or extra doses. What may interact with this medication? Aliskiren ACE inhibitors, like enalapril or lisinopril Diuretics, especially amiloride, eplerenone, spironolactone,  or triamterene Lithium NSAIDs, medications for pain and inflammation, like ibuprofen or naproxen Potassium salts or potassium supplements This list may not describe all possible interactions. Give your health care provider a list of all the medicines, herbs, non-prescription drugs, or dietary supplements you use. Also tell them if you smoke, drink alcohol, or use illegal drugs. Some items may interact with your medicine. What should I watch for while using this medication? Visit your care team for regular check ups. Check your blood pressure as directed. Ask your care team what your blood pressure should be. Also, find out when you should contact them. Do not treat yourself for coughs, colds, or pain while you are using this medication without asking your care team for advice. Some medications may increase your blood pressure. Women should inform their care team if they wish to become pregnant or think they might be pregnant. There is a potential for serious side effects to an unborn child. Talk to your care team for more information. You may get drowsy or dizzy. Do not drive, use machinery, or do anything that needs mental alertness until you know how this medication affects you. Do not stand or sit up quickly, especially if you are an older patient. This reduces the risk of dizzy or fainting spells. Alcohol can make you more drowsy and dizzy. Avoid alcoholic drinks. Avoid salt substitutes unless you are told otherwise by your care team. What side effects may I notice from receiving this medication? Side effects that you should report to your care team as soon as possible: Allergic reactions--skin rash, itching, hives, swelling of the face, lips, tongue, or throat High potassium level--muscle weakness, fast or irregular heartbeat Kidney injury--decrease in the amount of urine, swelling of the ankles, hands, or feet Low blood pressure--dizziness, feeling faint or lightheaded, blurry vision Side  effects that usually do not require medical attention (report to your care team if they continue or are bothersome): Dizziness Headache Runny or stuffy nose This list may not describe all possible side effects. Call your doctor for medical advice about side effects. You may report side effects to FDA at 1-800-FDA-1088. Where should I keep my medication? Keep out of the reach of children and pets. Store at room temperature between 20 and 25 degrees C (68 and 77 degrees F). Protect from light. Keep the container tightly closed. Get rid of any unused medication after the expiration date. To get rid of medications that are no longer needed or have expired: Take the medication to a medication take-back program. Check with your pharmacy or law enforcement to find a location. If you cannot return the medication, check the label or package insert to see if the medication should be thrown out in the garbage or flushed down the toilet. If you are not sure, ask your care team. If it is safe to put in the trash, empty the medication out of the container. Mix the medication with cat litter, dirt, coffee grounds, or other unwanted substance. Seal the mixture  in a bag or container. Put it in the trash. NOTE: This sheet is a summary. It may not cover all possible information. If you have questions about this medicine, talk to your doctor, pharmacist, or health care provider.  2023 Elsevier/Gold Standard (2020-11-29 00:00:00)   Important Information About Sugar

## 2022-10-22 NOTE — Addendum Note (Signed)
Addended by: Alvis Lemmings C on: 10/22/2022 04:19 PM   Modules accepted: Orders

## 2022-10-22 NOTE — Progress Notes (Signed)
Cardiology Office Note:    Date:  10/22/2022   ID:  Jillian Oconnell, DOB 09/23/1934, MRN [id number]  PCP:  Tonia Ghent, MD  [facility] Cardiologist:  Kathlyn Sacramento, MD  [facility] Electrophysiologist:  None   Referring MD: Tonia Ghent, MD   Chief Complaint: Afib follow-up  History of Present Illness:    Jillian Oconnell is a 88 y.o. female with a hx of sinus tachycardia, frequent PVCs, nonobstructive CAD and Afib who presents for follow-up.   She had a previous cardiac cath in February 2009 which showed minor luminal irregularities with no evidence of obstutive disease. EF was normal. Echo 2017 showed normal LVSF and no significant valvular disease.    Holter monitor in October 2017 showed very frequent PVCs with a total of 34,000 beats representing 25% burden. PVCs have been well controlled with Toprol. She had an attempted repeat echocardiogram in 2021 but was not diagnostic due to poor visualization.   Admitted 05/2022 for slurred speech found to have acute CVA. She was also treated for UTI. Zio was placed at discharge. She was sent home on ASA and Plavix. Heart monitor showed normal rhythm with 41 episodes of SVT longest 12.9 seconds and rare PACs/PVCs.   Seen 08/06/22 and she was in afib RVR with heart rate of 124bpm and was asymptomatic. She was started on Eliquis 2.'5mg'$  BID for CHADSVASC of at least 6. She was already on Toprol.    She was seen 08/27/22 and was still in Afib with rate 114bpm. Patient was not wanting to pursue TEE/DCCV, so she was started on amiodarone load and subsequent daily dose. Lower leg edema was present and the patient was given lasix '20mg'$  to use as needed.   Last seen 09/21/22 and was in NSR on amiodarone. Amlodipine was stopped for persistent LLE.  Today, EKG shows normal sinus rhythm.  Patient is overall feeling well.  She denies chest pain, shortness of breath, lower leg edema, orthopnea, PND, palpitations. BP is mildly elevated. She takes  Toprol '100mg'$  BID. She is willing to try another medication for BP.   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 Minor luminal irregularities with no evidence of obstructive disease.   cataract surgery     [place]  2004   TOTAL ABDOMINAL HYSTERECTOMY      Current Medications: Current Meds  Medication Sig   albuterol (VENTOLIN HFA) 108 (90 Base) MCG/ACT inhaler Inhale 1-2 puffs into the lungs every 6 (  six) hours as needed for wheezing or shortness of breath.   amiodarone (PACERONE) 200 MG tablet Take 1 tablet (200 mg) by mouth once daily   apixaban (ELIQUIS) 2.5 MG TABS tablet Take 1 tablet (2.5 mg total) by mouth 2 (two) times daily.   Calcium Carbonate-Vitamin D 600-400 MG-UNIT tablet Take 1 tablet by mouth 2 (two) times daily. Take 1 tablet by mouth two times a day   fluticasone (FLONASE) 50 MCG/ACT nasal spray Place 2 sprays into both nostrils daily.   Fluticasone Propionate, Inhal, (FLOVENT DISKUS) 250 MCG/BLIST AEPB INHALE 2 PUFFS IN THE MORNING AND INHALE 2 PUFFS IN THE EVENING (RINSE AFTER USE) (Patient taking differently: Inhale 2 puffs into the lungs 2 (two) times daily. INHALE 2 PUFFS IN THE MORNING AND INHALE 2  PUFFS IN THE EVENING (RINSE AFTER USE))   furosemide (LASIX) 20 MG tablet Take 1 tablet (20  mg) by mouth once daily as needed   HYDROcodone-acetaminophen (NORCO) 7.5-325 MG tablet Take 1 tablet by mouth 3 (three) times daily as needed (for pain.). Fill on/after 08/28/22 (Patient taking differently: Take 1 tablet by mouth 3 (three) times daily as needed for moderate pain or severe pain (for pain.). Fill on/after 08/28/22)   HYDROcodone-acetaminophen (NORCO) 7.5-325 MG tablet Take 1 tablet by mouth 3 (three) times daily as needed (for pain. fill on/after 09/27/22). (Patient taking differently: Take 1 tablet by mouth 3 (three) times daily as needed for moderate pain or severe pain (for pain. fill on/after 09/27/22).)   lansoprazole (PREVACID) 15 MG capsule Take 1 capsule by mouth once daily (Patient taking differently: Take 15 mg by mouth daily at 12 noon.)   levothyroxine (SYNTHROID) 25 MCG tablet Take 1 tablet (25 mcg total) by mouth daily.   LORazepam (ATIVAN) 1 MG tablet TAKE 1/2 TABLET BY MOUTH TWICE DAILY AS NEEDED (SEDATION CAUTION) (Patient taking differently: Take 0.5 mg by mouth 2 (two) times daily as needed for sedation. TAKE 1/2 TABLET BY MOUTH TWICE DAILY AS NEEDED (SEDATION CAUTION))   metoprolol succinate (TOPROL-XL) 100 MG 24 hr tablet TAKE 1 TABLET TWICE DAILY WITH OR IMMEDIATELY FOLLOWING A MEAL (Patient taking differently: Take 100 mg by mouth daily.)   montelukast (SINGULAIR) 10 MG tablet TAKE 1 TABLET AT BEDTIME (Patient taking differently: Take 10 mg by mouth at bedtime.)   mupirocin ointment (BACTROBAN) 2 % Apply topically 2 (two) times daily.   ondansetron (ZOFRAN) 4 MG tablet Take 1 tablet (4 mg total) by mouth every 8 (eight) hours as needed for nausea or vomiting.     Allergies:   Albuterol, Alendronate sodium, Amoxicillin-pot clavulanate, Atorvastatin, Azithromycin, Ezetimibe-simvastatin, Gabapentin, Ibandronate sodium, Lexapro [escitalopram oxalate], Lyrica [pregabalin],  Neosporin [bacitracin-polymyxin b], Risedronate sodium, Rosuvastatin, Sertraline, and Sulfonamide derivatives   Social History   Socioeconomic History   Marital status: Widowed    Spouse name: Not on file   Number of children: Not on file   Years of education: Not on file   Highest education level: Not on file  Occupational History   Occupation: Retired    Fish farm manager: retired    Comment: Homemaker  Tobacco Use   Smoking status: Never   Smokeless tobacco: Never  Scientific laboratory technician Use: Never used  Substance and Sexual Activity   Alcohol use: No    Alcohol/week: 0.0 standard drinks of alcohol   Drug use: No   Sexual activity: Not Currently    Birth control/protection: Post-menopausal  Other Topics Concern   Not on file  Social  History Narrative   Regular exercise- no    Moved here from [facility]- to be closer to family    Husband died of lung CA 03/01/13   Social Determinants of Health   Financial Resource Strain: Low Risk  (12/04/2021)   Overall Financial Resource Strain (CARDIA)    Difficulty of Paying Living Expenses: Not hard at all  Food Insecurity: No Food Insecurity (12/04/2021)   Hunger Vital Sign    Worried About Running Out of Food in the Last Year: Never true    Ran Out of Food in the Last Year: Never true  Transportation Needs: No Transportation Needs (12/04/2021)   PRAPARE - Hydrologist (Medical): No    Lack of Transportation (Non-Medical): No  Physical Activity: Inactive (12/04/2021)   Exercise Vital Sign    Days of Exercise per Week: 0 days    Minutes of Exercise per Session: 0 min  Stress: No Stress Concern Present (12/04/2021)   [place]    Feeling of Stress : Not at all  Social Connections: Socially Isolated (12/04/2021)   Social Connection and Isolation Panel [NHANES]    Frequency of Communication with Friends and Family: More than three times a week     Frequency of Social Gatherings with Friends and Family: More than three times a week    Attends Religious Services: Never    Marine scientist or Organizations: No    Attends Archivist Meetings: Never    Marital Status: Widowed     Family History: [name] family history includes Aneurysm in her father; Anxiety disorder in her daughter; Breast cancer in her daughter; Breast cancer (age of onset: 39) in her daughter; Breast cancer (age of onset: 19) in her mother; Cancer in her daughter and sister; Colon cancer in her sister; Hypertension in her father and mother.  ROS:   Please see the history of present illness.     All other systems reviewed and are negative.  EKGs/Labs/Other Studies Reviewed:    The following studies were reviewed today:  Heart monitor 06/2022 Study Highlights     Patch Wear Time:  13 days and 18 hours (2023-06-24T13:41:02-399 to 2023-07-08T07:48:07-399)   Patient had a min HR of 59 bpm, max HR of 156 bpm, and avg HR of 74 bpm. Predominant underlying rhythm was Sinus Rhythm.  41 Supraventricular Tachycardia runs occurred, the run with the fastest interval lasting 5 beats with a max rate of 156 bpm, the  longest lasting 12.9 secs with an avg rate of 115 bpm. Rare PACs and rare PVCs.     Echo 05/2022  1. Left ventricular ejection fraction, by estimation, is 60 to 65%. The  left ventricle has normal function. The left ventricle has no regional  wall motion abnormalities. Left ventricular diastolic function could not  be evaluated.   2. Right ventricular systolic function is normal. The right ventricular  size is not well visualized.   3. The mitral valve is degenerative. No evidence of mitral valve  regurgitation. Severe mitral annular calcification.   4. The aortic valve is calcified. Aortic valve regurgitation is not  visualized. Aortic valve sclerosis/calcification is present, without any  evidence of aortic stenosis.   5. The  inferior vena cava is normal in size with greater than 50%  respiratory variability, suggesting right atrial pressure of 3 mmHg.   EKG:  EKG is  ordered today.  The ekg ordered today  demonstrates normal sinus rhythm, 65 bpm, no significant changes.  Recent Labs: 06/05/2022: ALT 16; B Natriuretic Peptide 410.1; Magnesium 1.9; TSH 2.173 06/14/2022: Hemoglobin 14.0; Platelets 273.0 06/21/2022: BUN 16; Creatinine, Ser 1.21; Potassium 4.1; Sodium 138  Recent Lipid Panel    Component Value Date/Time   CHOL 166 06/06/2022 0427   TRIG 60 06/06/2022 0427   HDL 64 06/06/2022 0427   CHOLHDL 2.6 06/06/2022 0427   VLDL 12 06/06/2022 0427   LDLCALC 90 06/06/2022 0427   LDLCALC 116 (H) 08/07/2019 1507   LDLDIRECT 148.1 01/06/2013 1157     Physical Exam:    VS:  BP (!) 150/77 (BP Location: Right Arm, Patient Position: Sitting, Cuff Size: Normal)   Pulse 65   Ht '4\' 10"'$  (1.473 m)   Wt 116 lb 6.4 oz (52.8 kg)   SpO2 97%   BMI 24.33 kg/m     Wt Readings from Last 3 Encounters:  10/22/22 116 lb 6.4 oz (52.8 kg)  09/21/22 121 lb 12.8 oz (55.2 kg)  08/27/22 128 lb 12.8 oz (58.4 kg)     GEN:  Well nourished, well developed in no acute distress HEENT: Normal NECK: No JVD; No carotid bruits LYMPHATICS: No lymphadenopathy CARDIAC: RRR, no murmurs, rubs, gallops RESPIRATORY:  Clear to auscultation without rales, wheezing or rhonchi  ABDOMEN: Soft, non-tender, non-distended MUSCULOSKELETAL:  No edema; No deformity  SKIN: Warm and dry NEUROLOGIC:  Alert and oriented x 3 PSYCHIATRIC:  Normal affect   ASSESSMENT:    1. Paroxysmal A-fib ([place])   2. Lower leg edema   3. Cerebrovascular accident (CVA), unspecified mechanism ([place])   4. Symptomatic PVCs   5. Essential hypertension    PLAN:    In order of problems listed above:  Paroxysmal Afib Patient remains in sinus rhythm on amiodarone.  Continue Eliquis 2.5 mg twice daily for stroke prophylaxis.  Continue amiodarone 200 mg daily and Toprol  100 mg twice daily.  Lower leg edema Resolved with discontinuation of amlodipine.  CVA 05/2022 No residual weakness noted.  Continue Eliquis.  Symptomatic PVCs EKG shows no PVCs.  Continue beta-blocker therapy  HTN Blood pressure is mildly elevated today.  I will start losartan 25 mg daily, BMET in 1-2 weeks.  Continue Toprol 100 mg twice daily.  Disposition: Follow up in 3 month(s) with MD/APP    Signed, Mamoudou Mulvehill Ninfa Meeker, PA-C  10/22/2022 4:12 PM    [facility]

## 2022-11-19 ENCOUNTER — Encounter: Payer: Self-pay | Admitting: Family Medicine

## 2022-11-19 ENCOUNTER — Ambulatory Visit (INDEPENDENT_AMBULATORY_CARE_PROVIDER_SITE_OTHER): Payer: Medicare HMO | Admitting: Family Medicine

## 2022-11-19 VITALS — BP 120/80 | HR 71 | Temp 98.2°F | Ht <= 58 in | Wt 113.0 lb

## 2022-11-19 DIAGNOSIS — R413 Other amnesia: Secondary | ICD-10-CM | POA: Diagnosis not present

## 2022-11-19 DIAGNOSIS — Z23 Encounter for immunization: Secondary | ICD-10-CM | POA: Diagnosis not present

## 2022-11-19 DIAGNOSIS — J0100 Acute maxillary sinusitis, unspecified: Secondary | ICD-10-CM | POA: Diagnosis not present

## 2022-11-19 DIAGNOSIS — I10 Essential (primary) hypertension: Secondary | ICD-10-CM

## 2022-11-19 MED ORDER — AMOXICILLIN 875 MG PO TABS: 875.0000 mg | ORAL_TABLET | Freq: Two times a day (BID) | ORAL | 0 refills | Status: AC

## 2022-11-19 NOTE — Progress Notes (Unsigned)
Sinus drainage and facial pressure- maxillary.  Hoarse for the last few weeks.  No ST.  No fevers. No cough usually but post nasal gtt noted.  No ear pain.  Swallowing well.  Not SOB.  Still with shingles pain on the L side, 4/10 today.  No ADE on hydrocodone.  Decrease in appetite recently noted.  Off inhalers/flonase and lasix recently.    Needs f/u BMET today with update to cardiology.  Now on losartan.    Family has noted memory lapses, repeating herself.  Patient noted it also.  Gradually progressive.  Also HOH at baseline.    MMSE done today:  25/30, -2 for orientation to [place], -1 for attention, -2 for recall.  Meds, vitals, and allergies reviewed.   ROS: Per HPI unless specifically indicated in ROS section   GEN: nad, alert and oriented HEENT: mucous membranes moist, sinus tender to palpation x 4. NECK: supple w/o LA CV: rrr.  PULM: ctab, no inc wob ABD: soft, +bs EXT: no edema SKIN: Well-perfused. MMSE done today:  25/30, -2 for orientation to [place], -1 for attention, -2 for recall.  35 minutes were devoted to patient care in this encounter (this includes time spent reviewing the patient's file/history, interviewing and examining the patient, counseling/reviewing plan with patient).

## 2022-11-19 NOTE — Patient Instructions (Signed)
Go to the lab on the way out.   If you have mychart we'll likely use that to update you.    Take care.  Glad to see you. Start amoxil.  Let me know if you are not improving.

## 2022-11-20 LAB — CBC WITH DIFFERENTIAL/PLATELET
Basophils Absolute: 0.1 10*3/uL (ref 0.0–0.1)
Basophils Relative: 1.1 % (ref 0.0–3.0)
Eosinophils Absolute: 0.5 10*3/uL (ref 0.0–0.7)
Eosinophils Relative: 7.8 % — ABNORMAL HIGH (ref 0.0–5.0)
HCT: 40.8 % (ref 36.0–46.0)
Hemoglobin: 13.5 g/dL (ref 12.0–15.0)
Lymphocytes Relative: 17.9 % (ref 12.0–46.0)
Lymphs Abs: 1.2 10*3/uL (ref 0.7–4.0)
MCHC: 33 g/dL (ref 30.0–36.0)
MCV: 92.1 fl (ref 78.0–100.0)
Monocytes Absolute: 0.8 10*3/uL (ref 0.1–1.0)
Monocytes Relative: 12.3 % — ABNORMAL HIGH (ref 3.0–12.0)
Neutro Abs: 4.1 10*3/uL (ref 1.4–7.7)
Neutrophils Relative %: 60.9 % (ref 43.0–77.0)
Platelets: 318 10*3/uL (ref 150.0–400.0)
RBC: 4.43 Mil/uL (ref 3.87–5.11)
RDW: 16.1 % — ABNORMAL HIGH (ref 11.5–15.5)
WBC: 6.7 10*3/uL (ref 4.0–10.5)

## 2022-11-20 LAB — COMPREHENSIVE METABOLIC PANEL
ALT: 9 U/L (ref 0–35)
AST: 18 U/L (ref 0–37)
Albumin: 4 g/dL (ref 3.5–5.2)
Alkaline Phosphatase: 54 U/L (ref 39–117)
BUN: 19 mg/dL (ref 6–23)
CO2: 32 mEq/L (ref 19–32)
Calcium: 10 mg/dL (ref 8.4–10.5)
Chloride: 96 mEq/L (ref 96–112)
Creatinine, Ser: 1.44 mg/dL — ABNORMAL HIGH (ref 0.40–1.20)
GFR: 33.06 mL/min — ABNORMAL LOW (ref >60.00)
Glucose, Bld: 93 mg/dL (ref 70–99)
Potassium: 5.5 mEq/L — ABNORMAL HIGH (ref 3.5–5.1)
Sodium: 138 mEq/L (ref 135–145)
Total Bilirubin: 1 mg/dL (ref 0.2–1.2)
Total Protein: 7.1 g/dL (ref 6.0–8.3)

## 2022-11-20 LAB — VITAMIN B12: Vitamin B-12: 208 pg/mL — ABNORMAL LOW (ref 211–911)

## 2022-11-20 LAB — TSH: TSH: 3.59 u[IU]/mL (ref 0.35–5.50)

## 2022-11-22 ENCOUNTER — Other Ambulatory Visit: Payer: Self-pay | Admitting: Family Medicine

## 2022-11-22 DIAGNOSIS — R413 Other amnesia: Secondary | ICD-10-CM | POA: Insufficient documentation

## 2022-11-22 DIAGNOSIS — I10 Essential (primary) hypertension: Secondary | ICD-10-CM

## 2022-11-22 DIAGNOSIS — E538 Deficiency of other specified B group vitamins: Secondary | ICD-10-CM

## 2022-11-22 MED ORDER — CYANOCOBALAMIN 1000 MCG/ML IJ SOLN: INTRAMUSCULAR | 0 refills | Status: DC

## 2022-11-22 MED ORDER — HYDROCODONE-ACETAMINOPHEN 7.5-325 MG PO TABS: 1.0000 | ORAL_TABLET | Freq: Three times a day (TID) | ORAL | 0 refills | Status: DC | PRN

## 2022-11-22 MED ORDER — LOSARTAN POTASSIUM 25 MG PO TABS: 12.5000 mg | ORAL_TABLET | Freq: Every day | ORAL | Status: DC

## 2022-11-22 NOTE — Assessment & Plan Note (Signed)
Of unclear source. MMSE done today:  25/30, -2 for orientation to [place], -1 for attention, -2 for recall. Differential discussed with patient and daughter.  Check routine labs today.  She may end up needing CT head if labs are unremarkable.  At this point still okay for outpatient follow-up.

## 2022-11-22 NOTE — Assessment & Plan Note (Signed)
Blood pressure improved on losartan.  See notes on labs.

## 2022-11-22 NOTE — Assessment & Plan Note (Signed)
Start Amoxil.  Lungs clear.  Okay for outpatient follow-up.  Supportive care.

## 2022-11-25 ENCOUNTER — Other Ambulatory Visit: Payer: Self-pay | Admitting: Family Medicine

## 2022-11-25 MED ORDER — VITAMIN B-12 1000 MCG PO TABS: 1000.0000 ug | ORAL_TABLET | Freq: Every day | ORAL | Status: DC

## 2022-12-05 ENCOUNTER — Ambulatory Visit: Payer: Medicare HMO

## 2022-12-11 ENCOUNTER — Ambulatory Visit: Payer: Medicare HMO

## 2022-12-14 ENCOUNTER — Ambulatory Visit (INDEPENDENT_AMBULATORY_CARE_PROVIDER_SITE_OTHER): Payer: Medicare HMO

## 2022-12-14 VITALS — Ht <= 58 in | Wt 113.0 lb

## 2022-12-14 DIAGNOSIS — Z0000 Encounter for general adult medical examination without abnormal findings: Secondary | ICD-10-CM | POA: Diagnosis not present

## 2022-12-14 NOTE — Patient Instructions (Signed)
Jillian Oconnell , Thank you for taking time to come for your Medicare Wellness Visit. I appreciate your ongoing commitment to your health goals. Please review the following plan we discussed and let me know if I can assist you in the future.   These are the goals we discussed:    This is a list of the screening recommended for you and due dates:  Health Maintenance  Topic Date Due   DTaP/Tdap/Td vaccine (2 - Tdap) 05/02/2022   COVID-19 Vaccine (5 - 2023-24 season) 12/30/2022*   Mammogram  02/15/2023*   Zoster (Shingles) Vaccine (1 of 2) 03/15/2023*   Medicare Annual Wellness Visit  12/15/2023   Pneumonia Vaccine  Completed   Flu Shot  Completed   DEXA scan (bone density measurement)  Completed   HPV Vaccine  Aged Out  *Topic was postponed. The date shown is not the original due date.    Conditions/risks identified: none new  Next appointment: Follow up in one year for your annual wellness visit    Preventive Care 65 Years and Older, Female Preventive care refers to lifestyle choices and visits with your health care provider that can promote health and wellness. What does preventive care include? A yearly physical exam. This is also called an annual well check. Dental exams once or twice a year. Routine eye exams. Ask your health care provider how often you should have your eyes checked. Personal lifestyle choices, including: Daily care of your teeth and gums. Regular physical activity. Eating a healthy diet. Avoiding tobacco and drug use. Limiting alcohol use. Practicing safe sex. Taking low-dose aspirin every day. Taking vitamin and mineral supplements as recommended by your health care provider. What happens during an annual well check? The services and screenings done by your health care provider during your annual well check will depend on your age, overall health, lifestyle risk factors, and family history of disease. Counseling  Your health care provider may ask you  questions about your: Alcohol use. Tobacco use. Drug use. Emotional well-being. Home and relationship well-being. Sexual activity. Eating habits. History of falls. Memory and ability to understand (cognition). Work and work Statistician. Reproductive health. Screening  You may have the following tests or measurements: Height, weight, and BMI. Blood pressure. Lipid and cholesterol levels. These may be checked every 5 years, or more frequently if you are over 32 years old. Skin check. Lung cancer screening. You may have this screening every year starting at age 51 if you have a 30-pack-year history of smoking and currently smoke or have quit within the past 15 years. Fecal occult blood test (FOBT) of the stool. You may have this test every year starting at age 92. Flexible sigmoidoscopy or colonoscopy. You may have a sigmoidoscopy every 5 years or a colonoscopy every 10 years starting at age 93. Hepatitis C blood test. Hepatitis B blood test. Sexually transmitted disease (STD) testing. Diabetes screening. This is done by checking your blood sugar (glucose) after you have not eaten for a while (fasting). You may have this done every 1-3 years. Bone density scan. This is done to screen for osteoporosis. You may have this done starting at age 80. Mammogram. This may be done every 1-2 years. Talk to your health care provider about how often you should have regular mammograms. Talk with your health care provider about your test results, treatment options, and if necessary, the need for more tests. Vaccines  Your health care provider may recommend certain vaccines, such as: Influenza vaccine. This  is recommended every year. Tetanus, diphtheria, and acellular pertussis (Tdap, Td) vaccine. You may need a Td booster every 10 years. Zoster vaccine. You may need this after age 94. Pneumococcal 13-valent conjugate (PCV13) vaccine. One dose is recommended after age 61. Pneumococcal polysaccharide  (PPSV23) vaccine. One dose is recommended after age 46. Talk to your health care provider about which screenings and vaccines you need and how often you need them. This information is not intended to replace advice given to you by your health care provider. Make sure you discuss any questions you have with your health care provider. Document Released: 12/30/2015 Document Revised: 08/22/2016 Document Reviewed: 10/04/2015 Elsevier Interactive Patient Education  2017 [employer] Prevention in the Home Falls can cause injuries. They can happen to people of all ages. There are many things you can do to make your home safe and to help prevent falls. What can I do on the outside of my home? Regularly fix the edges of walkways and driveways and fix any cracks. Remove anything that might make you trip as you walk through a door, such as a raised step or threshold. Trim any bushes or trees on the path to your home. Use bright outdoor lighting. Clear any walking paths of anything that might make someone trip, such as rocks or tools. Regularly check to see if handrails are loose or broken. Make sure that both sides of any steps have handrails. Any raised decks and porches should have guardrails on the edges. Have any leaves, snow, or ice cleared regularly. Use sand or salt on walking paths during winter. Clean up any spills in your garage right away. This includes oil or grease spills. What can I do in the bathroom? Use night lights. Install grab bars by the toilet and in the tub and shower. Do not use towel bars as grab bars. Use non-skid mats or decals in the tub or shower. If you need to sit down in the shower, use a plastic, non-slip stool. Keep the floor dry. Clean up any water that spills on the floor as soon as it happens. Remove soap buildup in the tub or shower regularly. Attach bath mats securely with double-sided non-slip rug tape. Do not have throw rugs and other things on the  floor that can make you trip. What can I do in the bedroom? Use night lights. Make sure that you have a light by your bed that is easy to reach. Do not use any sheets or blankets that are too big for your bed. They should not hang down onto the floor. Have a firm chair that has side arms. You can use this for support while you get dressed. Do not have throw rugs and other things on the floor that can make you trip. What can I do in the kitchen? Clean up any spills right away. Avoid walking on wet floors. Keep items that you use a lot in easy-to-reach places. If you need to reach something above you, use a strong step stool that has a grab bar. Keep electrical cords out of the way. Do not use floor polish or wax that makes floors slippery. If you must use wax, use non-skid floor wax. Do not have throw rugs and other things on the floor that can make you trip. What can I do with my stairs? Do not leave any items on the stairs. Make sure that there are handrails on both sides of the stairs and use them. Fix handrails that  are broken or loose. Make sure that handrails are as long as the stairways. Check any carpeting to make sure that it is firmly attached to the stairs. Fix any carpet that is loose or worn. Avoid having throw rugs at the top or bottom of the stairs. If you do have throw rugs, attach them to the floor with carpet tape. Make sure that you have a light switch at the top of the stairs and the bottom of the stairs. If you do not have them, ask someone to add them for you. What else can I do to help prevent falls? Wear shoes that: Do not have high heels. Have rubber bottoms. Are comfortable and fit you well. Are closed at the toe. Do not wear sandals. If you use a stepladder: Make sure that it is fully opened. Do not climb a closed stepladder. Make sure that both sides of the stepladder are locked into place. Ask someone to hold it for you, if possible. Clearly mark and make  sure that you can see: Any grab bars or handrails. First and last steps. Where the edge of each step is. Use tools that help you move around (mobility aids) if they are needed. These include: Canes. Walkers. Scooters. Crutches. Turn on the lights when you go into a dark area. Replace any light bulbs as soon as they burn out. Set up your furniture so you have a clear path. Avoid moving your furniture around. If any of your floors are uneven, fix them. If there are any pets around you, be aware of where they are. Review your medicines with your doctor. Some medicines can make you feel dizzy. This can increase your chance of falling. Ask your doctor what other things that you can do to help prevent falls. This information is not intended to replace advice given to you by your health care provider. Make sure you discuss any questions you have with your health care provider. Document Released: 09/29/2009 Document Revised: 05/10/2016 Document Reviewed: 01/07/2015 Elsevier Interactive Patient Education  2017 [place]. Opioid Pain Medicine Management Opioids are powerful medicines that are used to treat moderate to severe pain. When used for short periods of time, they can help you to: Sleep better. Do better in physical or occupational therapy. Feel better in the first few days after an injury. Recover from surgery. Opioids should be taken with the supervision of a trained health care provider. They should be taken for the shortest period of time possible. This is because opioids can be addictive, and the longer you take opioids, the greater your risk of addiction. This addiction can also be called opioid use disorder. What are the risks? Using opioid pain medicines for longer than 3 days increases your risk of side effects. Side effects include: Constipation. Nausea and vomiting. Breathing difficulties (respiratory depression). Drowsiness. Confusion. Opioid use  disorder. Itching. Taking opioid pain medicine for a long period of time can affect your ability to do daily tasks. It also puts you at risk for: Motor vehicle crashes. Depression. Suicide. Heart attack. Overdose, which can be life-threatening. What is a pain treatment plan? A pain treatment plan is an agreement between you and your health care provider. Pain is unique to each person, and treatments vary depending on your condition. To manage your pain, you and your health care provider need to work together. To help you do this: Discuss the goals of your treatment, including how much pain you might expect to have and how you will  manage the pain. Review the risks and benefits of taking opioid medicines. Remember that a good treatment plan uses more than one approach and minimizes the chance of side effects. Be honest about the amount of medicines you take and about any drug or alcohol use. Get pain medicine prescriptions from only one health care provider. Pain can be managed with many types of alternative treatments. Ask your health care provider to refer you to one or more specialists who can help you manage pain through: Physical or occupational therapy. Counseling (cognitive behavioral therapy). Good nutrition. Biofeedback. Massage. Meditation. Non-opioid medicine. Following a gentle exercise program. How to use opioid pain medicine Taking medicine Take your pain medicine exactly as told by your health care provider. Take it only when you need it. If your pain gets less severe, you may take less than your prescribed dose if your health care provider approves. If you are not having pain, do nottake pain medicine unless your health care provider tells you to take it. If your pain is severe, do nottry to treat it yourself by taking more pills than instructed on your prescription. Contact your health care provider for help. Write down the times when you take your pain medicine. It is  easy to become confused while on pain medicine. Writing the time can help you avoid overdose. Take other over-the-counter or prescription medicines only as told by your health care provider. Keeping yourself and others safe  While you are taking opioid pain medicine: Do not drive, use machinery, or power tools. Do not sign legal documents. Do not drink alcohol. Do not take sleeping pills. Do not supervise children by yourself. Do not do activities that require climbing or being in high places. Do not go to a lake, river, ocean, spa, or swimming pool. Do not share your pain medicine with anyone. Keep pain medicine in a locked cabinet or in a secure area where pets and children cannot reach it. Stopping your use of opioids If you have been taking opioid medicine for more than a few weeks, you may need to slowly decrease (taper) how much you take until you stop completely. Tapering your use of opioids can decrease your risk of symptoms of withdrawal, such as: Pain and cramping in the abdomen. Nausea. Sweating. Sleepiness. Restlessness. Uncontrollable shaking (tremors). Cravings for the medicine. Do not attempt to taper your use of opioids on your own. Talk with your health care provider about how to do this. Your health care provider may prescribe a step-down schedule based on how much medicine you are taking and how long you have been taking it. Getting rid of leftover pills Do not save any leftover pills. Get rid of leftover pills safely by: Taking the medicine to a prescription take-back program. This is usually offered by the county or law enforcement. Bringing them to a pharmacy that has a drug disposal container. Flushing them down the toilet. Check the label or package insert of your medicine to see whether this is safe to do. Throwing them out in the trash. Check the label or package insert of your medicine to see whether this is safe to do. If it is safe to throw it out, remove  the medicine from the original container, put it into a sealable bag or container, and mix it with used coffee grounds, food scraps, dirt, or cat litter before putting it in the trash. Follow these instructions at home: Activity Do exercises as told by your health care provider. Avoid activities  that make your pain worse. Return to your normal activities as told by your health care provider. Ask your health care provider what activities are safe for you. General instructions You may need to take these actions to prevent or treat constipation: Drink enough fluid to keep your urine pale yellow. Take over-the-counter or prescription medicines. Eat foods that are high in fiber, such as beans, whole grains, and fresh fruits and vegetables. Limit foods that are high in fat and processed sugars, such as fried or sweet foods. Keep all follow-up visits. This is important. Where to find support If you have been taking opioids for a long time, you may benefit from receiving support for quitting from a local support group or counselor. Ask your health care provider for a referral to these resources in your area. Where to find more information Centers for Disease Control and Prevention (CDC): http://www.wolf.info/ [place] Food and Drug Administration (FDA): [employer] Get help right away if: You may have taken too much of an opioid (overdosed). Common symptoms of an overdose: Your breathing is slower or more shallow than normal. You have a very slow heartbeat (pulse). You have slurred speech. You have nausea and vomiting. Your pupils become very small. You have other potential symptoms: You are very confused. You faint or feel like you will faint. You have cold, clammy skin. You have blue lips or fingernails. You have thoughts of harming yourself or harming others. These symptoms may represent a serious problem that is an emergency. Do not wait to see if the symptoms will go away. Get medical help right away.  Call your local emergency services (911 in the [place] Do not drive yourself to the hospital.  If you ever feel like you may hurt yourself or others, or have thoughts about taking your own life, get help right away. Go to your nearest emergency department or: Call your local emergency services (911 in the [place] Call the [place] [facility] 719-199-3525 in the [place] Call a suicide crisis helpline, such as the [place] at (608) 011-6627 or 988 in the [place]. This is open 24 hours a day in the [place] Text the Crisis Text Line at (404)730-7088 (in the [place].). Summary Opioid medicines can help you manage moderate to severe pain for a short period of time. A pain treatment plan is an agreement between you and your health care provider. Discuss the goals of your treatment, including how much pain you might expect to have and how you will manage the pain. If you think that you or someone else may have taken too much of an opioid, get medical help right away. This information is not intended to replace advice given to you by your health care provider. Make sure you discuss any questions you have with your health care provider. Document Revised: 06/28/2021 Document Reviewed: 03/15/2021 Elsevier Patient Education  [place].

## 2022-12-14 NOTE — Progress Notes (Signed)
Subjective:   Valda Christenson Certain is a 88 y.o. female who presents for Medicare Annual (Subsequent) preventive examination.  Review of Systems    No ROS.  Medicare Wellness Virtual Visit.  Visual/audio telehealth visit, UTA vital signs.   See social history for additional risk factors.   Cardiac Risk Factors include: advanced age (>66mn, >>60women)     Objective:    Today's Vitals   12/14/22 1138  Weight: 113 lb (51.3 kg)  Height: '4\' 10"'$  (1.473 m)   Body mass index is 23.62 kg/m.     12/14/2022   12:25 PM 06/06/2022    3:00 AM 12/04/2021    1:19 PM 07/30/2018   11:51 AM 04/23/2017    1:52 PM  Advanced Directives  Does Patient Have a Medical Advance Directive? Yes No No No Yes  Type of Advance Directive     [place] Does patient want to make changes to medical advance directive? No - Patient declined      Copy of [place] Chart?     No - copy requested  Would patient like information on creating a medical advance directive?  No - Patient declined Yes (MAU/Ambulatory/Procedural Areas - Information given) Yes (MAU/Ambulatory/Procedural Areas - Information given)     Current Medications (verified) Outpatient Encounter Medications as of 12/14/2022  Medication Sig   albuterol (VENTOLIN HFA) 108 (90 Base) MCG/ACT inhaler Inhale 1-2 puffs into the lungs every 6 (six) hours as needed for wheezing or shortness of breath. (Patient not taking: Reported on 11/19/2022)   amiodarone (PACERONE) 200 MG tablet Take 1 tablet (200 mg) by mouth once daily   apixaban (ELIQUIS) 2.5 MG TABS tablet Take 1 tablet (2.5 mg total) by mouth 2 (two) times daily.   Calcium Carbonate-Vitamin D 600-400 MG-UNIT tablet Take 1 tablet by mouth 2 (two) times daily. Take 1 tablet by mouth two times a day   cyanocobalamin (VITAMIN B12) 1000 MCG tablet Take 1 tablet (1,000 mcg total) by mouth daily.   fluticasone (FLONASE) 50 MCG/ACT nasal spray Place 2 sprays into both  nostrils daily. (Patient not taking: Reported on 11/19/2022)   Fluticasone Propionate, Inhal, (FLOVENT DISKUS) 250 MCG/BLIST AEPB INHALE 2 PUFFS IN THE MORNING AND INHALE 2 PUFFS IN THE EVENING (RINSE AFTER USE) (Patient not taking: Reported on 11/19/2022)   furosemide (LASIX) 20 MG tablet Take 1 tablet (20  mg) by mouth once daily as needed (Patient not taking: Reported on 11/19/2022)   HYDROcodone-acetaminophen (NORCO) 7.5-325 MG tablet Take 1 tablet by mouth 3 (three) times daily as needed (for pain.). Fill on/after 11/28/22   HYDROcodone-acetaminophen (NORCO) 7.5-325 MG tablet Take 1 tablet by mouth 3 (three) times daily as needed (for pain. fill on/after 12/28/22).   HYDROcodone-acetaminophen (NORCO) 7.5-325 MG tablet Take 1 tablet by mouth 3 (three) times daily as needed (for pain.). Fill on/after 01/26/23   lansoprazole (PREVACID) 15 MG capsule Take 1 capsule by mouth once daily   levothyroxine (SYNTHROID) 25 MCG tablet Take 1 tablet (25 mcg total) by mouth daily.   LORazepam (ATIVAN) 1 MG tablet TAKE 1/2 TABLET BY MOUTH TWICE DAILY AS NEEDED (SEDATION CAUTION)   losartan (COZAAR) 25 MG tablet Take 0.5 tablets (12.5 mg total) by mouth daily.   metoprolol succinate (TOPROL-XL) 100 MG 24 hr tablet TAKE 1 TABLET TWICE DAILY WITH OR IMMEDIATELY FOLLOWING A MEAL   montelukast (SINGULAIR) 10 MG tablet TAKE 1 TABLET AT BEDTIME   mupirocin ointment (BACTROBAN) 2 %  Apply topically 2 (two) times daily.   ondansetron (ZOFRAN) 4 MG tablet Take 1 tablet (4 mg total) by mouth every 8 (eight) hours as needed for nausea or vomiting.   No facility-administered encounter medications on file as of 12/14/2022.    Allergies (verified) Albuterol, Alendronate sodium, Amoxicillin-pot clavulanate, Atorvastatin, Azithromycin, Ezetimibe-simvastatin, Gabapentin, Ibandronate sodium, Lexapro [escitalopram oxalate], Lyrica [pregabalin], Neosporin [bacitracin-polymyxin b], Risedronate sodium, Rosuvastatin, Sertraline, and  Sulfonamide derivatives   History: Past Medical History:  Diagnosis Date   Actinic keratosis    Allergy    Anxiety    Asthma    Concussion    with fall at home 10/2016   Depression    GERD (gastroesophageal reflux disease)    Glaucoma    Hyperlipidemia    Hypertension    IBS (irritable bowel syndrome)    OA (osteoarthritis) of knee    injections   Osteopenia    DXA 10-Mar-2010   Palpitations    Paroxysmal supraventricular tachycardia May of 2005   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 Minor luminal irregularities with no evidence of obstructive disease.   cataract surgery     [place]  11-Mar-2003   TOTAL ABDOMINAL HYSTERECTOMY     Family History  Problem Relation Age of Onset   Hypertension Mother    Breast cancer Mother 56   Hypertension Father    Aneurysm Father        AAA   Cancer Sister        colon   Colon cancer Sister    Cancer Daughter        thyroid   Anxiety disorder Daughter    Breast cancer Daughter 23   Breast cancer Daughter    Social History   Socioeconomic History   Marital status: Widowed    Spouse name: Not on file   Number of children: Not on file   Years of education: Not on file   Highest education level: Not on file  Occupational History   Occupation: Retired    Fish farm manager: retired    Comment: Homemaker  Tobacco Use   Smoking status: Never   Smokeless tobacco: Never  Scientific laboratory technician Use: Never used  Substance and Sexual Activity   Alcohol use: No    Alcohol/week: 0.0 standard drinks of alcohol   Drug use: No   Sexual activity: Not Currently    Birth control/protection:  Post-menopausal  Other Topics Concern   Not on file  Social History Narrative   Regular exercise- no    Moved here from [place]- to be closer to family    Husband died of lung CA March 10, 2013   Social Determinants of Health   Financial Resource Strain: Low Risk  (12/14/2022)   Overall Financial Resource Strain (CARDIA)    Difficulty of Paying Living Expenses: Not hard at all  Food Insecurity: No Food Insecurity (12/14/2022)   Hunger Vital Sign    Worried About Running Out of Food in the Last Year: Never true    [place] in  the Last Year: Never true  Transportation Needs: No Transportation Needs (12/14/2022)   PRAPARE - Hydrologist (Medical): No    Lack of Transportation (Non-Medical): No  Physical Activity: Inactive (12/04/2021)   Exercise Vital Sign    Days of Exercise per Week: 0 days    Minutes of Exercise per Session: 0 min  Stress: No Stress Concern Present (12/14/2022)   [place]    Feeling of Stress : Not at all  Social Connections: Socially Isolated (12/14/2022)   Social Connection and Isolation Panel [NHANES]    Frequency of Communication with Friends and Family: More than three times a week    Frequency of Social Gatherings with Friends and Family: More than three times a week    Attends Religious Services: Never    Marine scientist or Organizations: No    Attends Archivist Meetings: Never    Marital Status: Widowed    Tobacco Counseling Counseling given: Not Answered   Clinical Intake:  Pre-visit preparation completed: Yes           How often do you need to have someone help you when you read instructions, pamphlets, or other written materials from your doctor or pharmacy?: 1 - Never    Interpreter Needed?: No      Activities of Daily Living    12/14/2022   11:44 AM 06/06/2022    3:00 AM  In your present state of health, do you have  any difficulty performing the following activities:  Hearing? 1 0  Comment Difficulty hearing in R ear   Vision? 0 0  Difficulty concentrating or making decisions? 0 0  Comment Age appropriate   Walking or climbing stairs? 1 0  Dressing or bathing? 1 0  Comment Granddaughter assist   Doing errands, shopping? 1 0  Comment Daughter assist as needed   Preparing Food and eating ? Y   Comment Daughter assist   Using the Toilet? N   In the past six months, have you accidently leaked urine? N   Do you have problems with loss of bowel control? N   Managing your Medications? Y   Comment Daughter assist as needed   Managing your Finances? Y   Comment Daughter assist as needed   Housekeeping or managing your Housekeeping? Y   Comment Daughter assist     Patient Care Team: Tonia Ghent, MD as PCP - General (Family Medicine) Wellington Hampshire, MD as PCP - Cardiology (Cardiology)  Indicate any recent Medical Services you may have received from other than [facility] providers in the past year (date may be approximate).     Assessment:   This is a routine wellness examination for [place].  I connected with  Gayland Curry Schlosser on 12/14/22 by a audio enabled telemedicine application and verified that I am speaking with the correct person using two identifiers.  Patient Location: Home  Provider Location: Office/Clinic  I discussed the limitations of evaluation and management by telemedicine. The patient expressed understanding and agreed to proceed.   Hearing/Vision screen Hearing Screening - Comments:: Decrease hearing in right ear Audiology deferred in the last year per patient preference Vision Screening - Comments:: Last exam 2023, wears glasses, Dr. Wallace Going  Dietary issues and exercise activities discussed:     Goals Addressed             This Visit's Progress    Maintain healthy lifestyle  Stay active Healthy diet       Depression Screen    12/14/2022   11:41 AM  12/04/2021    1:23 PM 11/25/2020    3:59 PM 08/10/2019   11:04 AM 07/30/2018    5:04 PM 04/23/2017    1:51 PM 02/21/2016   11:23 AM  PHQ 2/9 Scores  PHQ - 2 Score 0 0 0 0 0 0 2  PHQ- 9 Score     0      Fall Risk    12/14/2022   11:44 AM 12/04/2021    1:21 PM 11/25/2020    3:59 PM 06/30/2020   11:46 AM 08/10/2019   11:03 AM  [place] in the past year? 0 0 0 0 0  Number falls in past yr: 0 0 0 0   Injury with Fall? 0 0 0 0   Risk for fall due to : Impaired balance/gait      Risk for fall due to: Comment walker in use when ambulating      Follow up Falls evaluation completed;Falls prevention discussed Falls prevention discussed Falls evaluation completed      FALL RISK PREVENTION PERTAINING TO THE HOME: Home free of loose throw rugs in walkways, pet beds, electrical cords, etc? Yes  Adequate lighting in your home to reduce risk of falls? Yes   ASSISTIVE DEVICES UTILIZED TO PREVENT FALLS: Use of a cane, walker or w/c? Yes   TIMED UP AND GO: Was the test performed? No .   Cognitive Function:    07/30/2018    5:04 PM 04/23/2017    2:03 PM  MMSE - Mini Mental State Exam  Orientation to time 5 5  Orientation to Place 5 5  Registration 3 3  Attention/ Calculation 0 0  Recall 3 3  Language- name 2 objects 0 0  Language- repeat 1 1  Language- follow 3 step command 3 3  Language- read & follow direction 0 0  Write a sentence 0 0  Copy design 0 0  Total score 20 20        12/14/2022   12:20 PM  6CIT Screen  What Year? 0 points  What month? 0 points  What time? 0 points  Months in reverse 0 points    Immunizations Immunization History  Administered Date(s) Administered   Fluad Quad(high Dose 65+) 09/15/2019, 11/25/2020, 11/19/2022   Influenza Split 11/18/2012   Influenza Whole 10/03/2009, 09/29/2010   Influenza,inj,Quad PF,6+ Mos 09/18/2013, 10/08/2014, 10/22/2016, 11/21/2017, 10/06/2018   Influenza-Unspecified 10/21/2015, 10/06/2021   PFIZER(Purple  Top)SARS-COV-2 Vaccination 02/11/2020, 03/09/2020, 10/16/2020, 10/06/2021   Pneumococcal Conjugate-13 07/01/2015   Pneumococcal Polysaccharide-23 12/17/2004   Td 05/02/2012   Shingrix Completed?: No.    Education has been provided regarding the importance of this vaccine. Patient has been advised to call insurance company to determine out of pocket expense if they have not yet received this vaccine. Advised may also receive vaccine at local pharmacy or Health Dept. Verbalized acceptance and understanding.  Screening Tests Health Maintenance  Topic Date Due   DTaP/Tdap/Td (2 - Tdap) 05/02/2022   COVID-19 Vaccine (5 - 2023-24 season) 12/30/2022 (Originally 08/17/2022)   MAMMOGRAM  02/15/2023 (Originally 09/26/2021)   Zoster Vaccines- Shingrix (1 of 2) 03/15/2023 (Originally 11/28/1953)   Medicare Annual Wellness (AWV)  12/15/2023   Pneumonia Vaccine 80+ Years old  Completed   INFLUENZA VACCINE  Completed   DEXA SCAN  Completed   HPV [place]  Maintenance Health Maintenance Due  Topic Date Due   DTaP/Tdap/Td (2 - Tdap) 05/02/2022    Mammogram- deferred per patient preference.   DG Chest 2 view- completed 06/05/22.   Hepatitis C Screening: does not qualify.  Vision Screening: Recommended annual ophthalmology exams for early detection of glaucoma and other disorders of the eye.  Dental Screening: Recommended annual dental exams for proper oral hygiene  Community Resource Referral / Chronic Care Management: CRR required this visit?  No   CCM required this visit?  No      Plan:     I have personally reviewed and noted the following in the patient's chart:   Medical and social history Use of alcohol, tobacco or illicit drugs  Current medications and supplements including opioid prescriptions. Patient is currently taking opioid prescriptions. Information provided to patient regarding non-opioid alternatives. Patient advised to discuss non-opioid treatment plan  with their provider. Followed by pcp. Functional ability and status Nutritional status Physical activity Advanced directives List of other physicians Hospitalizations, surgeries, and ER visits in previous 12 months Vitals Screenings to include cognitive, depression, and falls Referrals and appointments  In addition, I have reviewed and discussed with patient certain preventive protocols, quality metrics, and best practice recommendations. A written personalized care plan for preventive services as well as general preventive health recommendations were provided to patient.     Leta Jungling, LPN   [id number]

## 2023-01-05 ENCOUNTER — Other Ambulatory Visit: Payer: Self-pay | Admitting: Family Medicine

## 2023-01-05 DIAGNOSIS — Z8673 Personal history of transient ischemic attack (TIA), and cerebral infarction without residual deficits: Secondary | ICD-10-CM

## 2023-01-09 ENCOUNTER — Telehealth: Payer: Self-pay

## 2023-01-09 NOTE — Progress Notes (Signed)
Care Management & Coordination Services Pharmacy Team  Reason for Encounter: Appointment Reminder  Contacted patient to confirm telephone appointment with Charlene Brooke,  PharmD, on 01/14/2023 at 2:00.  Spoke with family on 01/09/2023   Do you have any problems getting your medications? No  What is your top health concern you would like to discuss at your upcoming visit? No concerns at the moment  Have you seen any other providers since your last visit with PCP? No  Chart review:  Recent office visits:  12/14/22 AWV 11/19/22 Elsie Stain Immunization abnormal labs: "Hemoglobin is normal.  Potassium is elevated. Kidney function is slightly worse than normal.  Her potassium elevation and kidney function test could be related to losartan use.  I would cut losartan in half and recheck labs in about 1 week.  She needs a nonfasting lab visit.  I already sent a note to cardiology about that. Her B12 level is low.  That could explain her memory loss.  I would take B12 injection weekly for 4 weeks then monthly thereafter and recheck lab in about 3 months.  Please see if she wants to do the injections at home or here at the clinic." Start: amoxicillin (AMOXIL) 875 MG tablet Change: HYDROcodone-acetaminophen (NORCO) 7.5-325 MG tablet 09/26/22 Message: Start Bactroban ointment due to leg sore.  Recent consult visits:  10/22/22 Cadence Furth, PA (Cardiology) Paroxysmal Atrial Fibriallation Ordered: EKG Start: losartan (COZAAR) 25 MG tablet F/U 3 months 09/21/22 Cadence Furth, PA (Cardiology) Paroxysmal Atrial Fibrillation Change: amiodarone (PACERONE) 200 MG tablet Change: furosemide (LASIX) 20 MG tablet Stop (side effects): amLODipine (NORVASC) 5 MG tablet F/U 2 weeks 08/27/22 Cadence Furth, PA (Cardiology) Ordered: EKG Start: amiodarone (PACERONE) 200 MG tablet Change: furosemide (LASIX) 20 MG tablet  "EKG today shows patient is still in Afib with a rate of 114bpm." F/U 2 - 3 weeks 08/06/22  Cadence Furth, PA (Cardiology) PAF Start: apixaban (ELIQUIS) 2.5 MG TABS tablet Stop: Aspirin 81 mg Stop: Pravastatin Sodium 10 mg 07/16/22 Brendolyn Patty, MD (Derm) [place] Procedure: Destruction of [place] 07/13/22 [facility] Dysuria Start: ciprofloxacin (CIPRO) 250 MG tablet   Hospital visits:  None in previous 6 months  Medications: Outpatient Encounter Medications as of 01/09/2023  Medication Sig   albuterol (VENTOLIN HFA) 108 (90 Base) MCG/ACT inhaler Inhale 1-2 puffs into the lungs every 6 (six) hours as needed for wheezing or shortness of breath. (Patient not taking: Reported on 11/19/2022)   amiodarone (PACERONE) 200 MG tablet Take 1 tablet (200 mg) by mouth once daily   apixaban (ELIQUIS) 2.5 MG TABS tablet Take 1 tablet (2.5 mg total) by mouth 2 (two) times daily.   Calcium Carbonate-Vitamin D 600-400 MG-UNIT tablet Take 1 tablet by mouth 2 (two) times daily. Take 1 tablet by mouth two times a day   cyanocobalamin (VITAMIN B12) 1000 MCG tablet Take 1 tablet (1,000 mcg total) by mouth daily.   fluticasone (FLONASE) 50 MCG/ACT nasal spray Place 2 sprays into both nostrils daily. (Patient not taking: Reported on 11/19/2022)   Fluticasone Propionate, Inhal, (FLOVENT DISKUS) 250 MCG/BLIST AEPB INHALE 2 PUFFS IN THE MORNING AND INHALE 2 PUFFS IN THE EVENING (RINSE AFTER USE) (Patient not taking: Reported on 11/19/2022)   furosemide (LASIX) 20 MG tablet Take 1 tablet (20  mg) by mouth once daily as needed (Patient not taking: Reported on 11/19/2022)   HYDROcodone-acetaminophen (NORCO) 7.5-325 MG tablet Take 1 tablet by mouth 3 (three) times daily as needed (for pain.). Fill on/after 11/28/22  HYDROcodone-acetaminophen (NORCO) 7.5-325 MG tablet Take 1 tablet by mouth 3 (three) times daily as needed (for pain. fill on/after 12/28/22).   HYDROcodone-acetaminophen (NORCO) 7.5-325 MG tablet Take 1 tablet by mouth 3 (three) times daily as needed (for pain.). Fill on/after 01/26/23   lansoprazole  (PREVACID) 15 MG capsule Take 1 capsule by mouth once daily   levothyroxine (SYNTHROID) 25 MCG tablet Take 1 tablet (25 mcg total) by mouth daily.   LORazepam (ATIVAN) 1 MG tablet TAKE 1/2 TABLET BY MOUTH TWICE DAILY AS NEEDED (SEDATION CAUTION)   losartan (COZAAR) 25 MG tablet Take 0.5 tablets (12.5 mg total) by mouth daily.   metoprolol succinate (TOPROL-XL) 100 MG 24 hr tablet TAKE 1 TABLET TWICE DAILY WITH OR IMMEDIATELY FOLLOWING A MEAL   montelukast (SINGULAIR) 10 MG tablet TAKE 1 TABLET AT BEDTIME   mupirocin ointment (BACTROBAN) 2 % Apply topically 2 (two) times daily.   ondansetron (ZOFRAN) 4 MG tablet Take 1 tablet (4 mg total) by mouth every 8 (eight) hours as needed for nausea or vomiting.   No facility-administered encounter medications on file as of 01/09/2023.   Lab Results  Component Value Date/Time   HGBA1C 5.5 06/06/2022 04:27 AM    BP Readings from Last 3 Encounters:  11/19/22 120/80  10/22/22 (!) 150/77  09/21/22 (!) 140/82    Star Rating Drugs:  Medication:  Last Fill: Day Supply Losartan 25 mg 10/22/22 90  Care Gaps: Annual wellness visit in last year? Yes 12/14/2022  Charlene Brooke, PharmD notified  Marijean Niemann, [place] [facility] Assistant [phone]

## 2023-01-12 ENCOUNTER — Other Ambulatory Visit: Payer: Self-pay | Admitting: Family Medicine

## 2023-01-12 DIAGNOSIS — E039 Hypothyroidism, unspecified: Secondary | ICD-10-CM

## 2023-01-14 ENCOUNTER — Ambulatory Visit: Payer: Medicare HMO | Admitting: Pharmacist

## 2023-01-14 NOTE — Progress Notes (Unsigned)
Care Management & Coordination Services Pharmacy Note  01/14/2023 Name:  Jillian Oconnell MRN:  [id number] DOB:  [birth year]  Summary: -HTN: pt stopped taking losartan a few days ago due to low BP (110s/50s); BP has been 130-140/70s since holding losartan -HLD/hx CVA: LDL 90 (05/2022) during admission for CVA; goal LDL < 70; pt has not tolerated statins, most recently stopped pravastatin due to GI upset -Appetite: pt reports minimal appetite and asked about taking something to improve appetite -Osteoporosis: DEXA 2019 T-score -3.9 (forearm), -2.9 (FN); she has not tolerated oral bisphosphonates and is willing to try Prolia if it is affordable  Recommendations/Changes made from today's visit: -Advised it is reasonable to continue holding losartan; check BP daily and contact provider if BP is consistently > 140/90 -Recommend Ezetimibe 10 mg daily -Recommend Mirtazapine 7.5 mg daily HS for appetite -Recommend Prolia - benefits check  Follow up plan: -Health Concierge will call patient *** -Pharmacist follow up televisit scheduled for *** -Cardiology appt 01/22/23    Subjective: Jillian Oconnell is an 89 y.o. year old female who is a primary patient of Damita Dunnings, Elveria Rising, MD.  The care coordination team was consulted for assistance with disease management and care coordination needs.    Engaged with patient by telephone for initial visit.  Recent office visits: 12/14/22 AWV 11/19/22 Dr Damita Dunnings OV: f/u - sinus drainage, rx Amoxicillin. Memory change, unclear source. K elevated, reduce losartan to 1/2 dose and repeat BMP 1 week. B12 is low, rec B12 injection weekly x 4 weeks then monthly. Pt declined injections and will take oral B12.  09/26/22 Message: Start Bactroban ointment due to leg sore.  07/05/22 Dr Damita Dunnings OV: f/u - BP 118/78  06/27/22 NP Tabitha Dugal OV: high BP - 190/100. Rx Amlodipine 5 mg.  06/14/22 Dr Damita Dunnings OV: hospital f/u - start pravastatin 10 mg MWF.  Recent consult  visits: 10/22/22 Cadence Furth, PA (Cardiology) Afib - BP 150/77. Rx Losartan 25 mg daily. Repeat BMP 1-2 wks.  09/21/22 Cadence Kathlen Mody, PA (Cardiology): Afib - change amiodarone to 200 mg daily. D/C amlodipine (edema)  08/27/22 Cadence Kathlen Mody, PA (Cardiology) Afib - EKG: AFIB. Declines DCCV. Start amiodarone 400 mg BID x 7 days, 200 mg BID x 7 days, then 200 mg daily.  08/16/22 Cardiology TE - stop clopidogrel since on Eliquis.  08/06/22 Cadence Furth, PA (Cardiology): New onset Afib: EKG rapid afib, HR 124. Start Eliquis 2.5 mg. D/C aspirin (provider). D/C pravastatin (pt preference)  07/16/22 Brendolyn Patty, MD (Derm) [place] Procedure: Destruction of Lesion 07/13/22 [facility] Dysuria Start: ciprofloxacin (CIPRO) 250 MG tablet  Hospital visits: 06/05/22 - 06/07/22 Admission [facility]): Acute CVA. Encourage statin or PCSK9i. Start aspirin and clopidogrel.   Objective:  Lab Results  Component Value Date   CREATININE 1.44 (H) 11/19/2022   BUN 19 11/19/2022   GFR 33.06 (L) 11/19/2022   GFRNONAA 53 (L) 06/07/2022   GFRAA 48 (L) 11/23/2016   NA 138 11/19/2022   K 5.5 (H) 11/19/2022   CALCIUM 10.0 11/19/2022   CO2 32 11/19/2022   GLUCOSE 93 11/19/2022    Lab Results  Component Value Date/Time   HGBA1C 5.5 06/06/2022 04:27 AM   GFR 33.06 (L) 11/19/2022 04:27 PM   GFR 40.85 (L) 06/21/2022 03:27 PM    Last diabetic Eye exam: No results found for: "HMDIABEYEEXA"  Last diabetic Foot exam: No results found for: "HMDIABFOOTEX"   Lab Results  Component Value Date   CHOL 166 06/06/2022   HDL  64 06/06/2022   LDLCALC 90 06/06/2022   LDLDIRECT 148.1 01/06/2013   TRIG 60 06/06/2022   CHOLHDL 2.6 06/06/2022       Latest Ref Rng & Units 11/19/2022    4:27 PM 06/05/2022   10:00 PM 12/28/2021    3:05 PM  Hepatic Function  Total Protein 6.0 - 8.3 g/dL 7.1  8.3  7.0   Albumin 3.5 - 5.2 g/dL 4.0  4.2  4.1   AST 0 - 37 U/L '18  24  18   '$ ALT 0 - 35 U/L '9  16  8   '$ Alk Phosphatase 39 -  117 U/L 54  61  52   Total Bilirubin 0.2 - 1.2 mg/dL 1.0  0.4  0.5     Lab Results  Component Value Date/Time   TSH 3.59 11/19/2022 04:27 PM   TSH 2.173 06/05/2022 10:00 PM   TSH 2.77 03/19/2022 03:40 PM   FREET4 1.27 (H) 06/05/2022 10:00 PM       Latest Ref Rng & Units 11/19/2022    4:27 PM 06/14/2022    1:08 PM 06/07/2022    7:39 AM  CBC  WBC 4.0 - 10.5 K/uL 6.7  7.1  7.9   Hemoglobin 12.0 - 15.0 g/dL 13.5  14.0  14.1   Hematocrit 36.0 - 46.0 % 40.8  43.4  42.9   Platelets 150.0 - 400.0 K/uL 318.0  273.0  235     Lab Results  Component Value Date/Time   VD25OH 69.61 12/28/2021 03:05 PM   VD25OH 81.15 03/03/2021 08:48 AM   VITAMINB12 208 (L) 11/19/2022 04:27 PM   VITAMINB12 447 11/25/2020 04:43 PM    Clinical ASCVD: Yes  The ASCVD Risk score (Arnett DK, et al., 2019) failed to calculate for the following reasons:   The 2019 ASCVD risk score is only valid for ages 80 to 33   The patient has a prior MI or stroke diagnosis    CHA2DS2/VAS Stroke Risk Points  Current as of yesterday     6 >= 2 Points: High Risk  1 - 1.99 Points: Medium Risk  0 Points: Low Risk    No Change       Points Metrics  0 Has Congestive Heart Failure:  No    Current as of yesterday  0 Has Vascular Disease:  No    Current as of yesterday  1 Has Hypertension:  Yes    Current as of yesterday  2 Age:  37    Current as of yesterday  0 Has Diabetes:  No    Current as of yesterday  2 Had Stroke:  Yes  Had TIA:  No  Had Thromboembolism:  No    Current as of yesterday  1 Female:  Yes    Current as of yesterday       12/14/2022   11:41 AM 12/04/2021    1:23 PM 11/25/2020    3:59 PM  Depression screen PHQ 2/9  Decreased Interest 0 0 0  Down, Depressed, Hopeless 0 0 0  PHQ - 2 Score 0 0 0     Social History   Tobacco Use  Smoking Status Never  Smokeless Tobacco Never   BP Readings from Last 3 Encounters:  11/19/22 120/80  10/22/22 (!) 150/77  09/21/22 (!) 140/82   Pulse Readings  from Last 3 Encounters:  11/19/22 71  10/22/22 65  09/21/22 69   Wt Readings from Last 3 Encounters:  12/14/22 113 lb (51.3 kg)  11/19/22 113 lb (51.3 kg)  10/22/22 116 lb 6.4 oz (52.8 kg)   BMI Readings from Last 3 Encounters:  12/14/22 23.62 kg/m  11/19/22 23.62 kg/m  10/22/22 24.33 kg/m    Allergies  Allergen Reactions   Albuterol     REACTION: jittery   Alendronate Sodium     REACTION: GI side eff   Amoxicillin-Pot Clavulanate     REACTION: non tolerant- but can take plain amox   Atorvastatin Nausea And Vomiting   Azithromycin     GI upset.  Not an allergy.   Ezetimibe-Simvastatin     REACTION: myalgia   Gabapentin     REACTION: tremor   Ibandronate Sodium     REACTION: GI side eff   Lexapro [Escitalopram Oxalate]     sedation   Lyrica [Pregabalin] Other (See Comments)    heart racing and didn't help pain.    Neosporin [Bacitracin-Polymyxin B]     Irritation locally   Risedronate Sodium     REACTION: GI side eff   Rosuvastatin     REACTION: myalgia   Sertraline     sedation   Sulfonamide Derivatives     REACTION: not tolerate    Medications Reviewed Today     Reviewed by Leta Jungling, LPN (Licensed Practical Nurse) on 12/14/22 at 1137  Med List Status: <None>   Medication Order Taking? Sig Documenting Provider Last Dose Status Informant  albuterol (VENTOLIN HFA) 108 (90 Base) MCG/ACT inhaler [id number] No Inhale 1-2 puffs into the lungs every 6 (six) hours as needed for wheezing or shortness of breath.  Patient not taking: Reported on 11/19/2022   Tonia Ghent, MD Not Taking Active Pharmacy Records, Other, Multiple Informants  amiodarone (PACERONE) 200 MG tablet [id number] No Take 1 tablet (200 mg) by mouth once daily Furth, Cadence H, PA-C Taking Active   apixaban (ELIQUIS) 2.5 MG TABS tablet [id number] No Take 1 tablet (2.5 mg total) by mouth 2 (two) times daily. Wellington Hampshire, MD Taking Active   Calcium Carbonate-Vitamin D 600-400 MG-UNIT  tablet [id number] No Take 1 tablet by mouth 2 (two) times daily. Take 1 tablet by mouth two times a day [provider] Taking Active Pharmacy Records, Other, Multiple Informants  cyanocobalamin (VITAMIN B12) 1000 MCG tablet [id number]  Take 1 tablet (1,000 mcg total) by mouth daily. Tonia Ghent, MD  Active   fluticasone [facility]) 50 MCG/ACT nasal spray [id number] No Place 2 sprays into both nostrils daily.  Patient not taking: Reported on 11/19/2022   Tonia Ghent, MD Not Taking Active Pharmacy Records, Other, Multiple Informants  Fluticasone Propionate, Inhal, (FLOVENT DISKUS) 250 MCG/BLIST AEPB [id number] No INHALE 2 PUFFS IN THE MORNING AND INHALE 2 PUFFS IN THE EVENING (RINSE AFTER USE)  Patient not taking: Reported on 11/19/2022   Tonia Ghent, MD Not Taking Active Pharmacy Records, Other, Multiple Informants  furosemide (LASIX) 20 MG tablet [id number] No Take 1 tablet (20  mg) by mouth once daily as needed  Patient not taking: Reported on 11/19/2022   Kathlen Mody, Cadence H, PA-C Not Taking Active   HYDROcodone-acetaminophen (NORCO) 7.5-325 MG tablet [id number]  Take 1 tablet by mouth 3 (three) times daily as needed (for pain.). Fill on/after 11/28/22 Tonia Ghent, MD  Active   HYDROcodone-acetaminophen [facility]) 7.5-325 MG tablet [id number]  Take 1 tablet by mouth 3 (three) times daily as needed (for pain. fill on/after 12/28/22). Tonia Ghent, MD  Active   HYDROcodone-acetaminophen [facility]) 7.5-325 MG tablet [id number]  Take 1 tablet by mouth 3 (three) times daily as needed (for pain.). Fill on/after 01/26/23 Tonia Ghent, MD  Active   lansoprazole (PREVACID) 15 MG capsule [id number] No Take 1 capsule by mouth once daily Tonia Ghent, MD Taking Active Pharmacy Records, Other, Multiple Informants  levothyroxine (SYNTHROID) 25 MCG tablet [id number] No Take 1 tablet (25 mcg total) by mouth daily. Tonia Ghent, MD Taking Active Pharmacy Records, Other, Multiple Informants   LORazepam (ATIVAN) 1 MG tablet [id number] No TAKE 1/2 TABLET BY MOUTH TWICE DAILY AS NEEDED (SEDATION CAUTION) Tonia Ghent, MD Taking Active   losartan (COZAAR) 25 MG tablet [id number]  Take 0.5 tablets (12.5 mg total) by mouth daily. Tonia Ghent, MD  Active   metoprolol succinate (TOPROL-XL) 100 MG 24 hr tablet [id number] No TAKE 1 TABLET TWICE DAILY WITH OR IMMEDIATELY FOLLOWING A MEAL Tonia Ghent, MD Taking Active   montelukast (SINGULAIR) 10 MG tablet [id number] No TAKE 1 TABLET AT BEDTIME Tonia Ghent, MD Taking Active   mupirocin ointment (BACTROBAN) 2 % [id number] No Apply topically 2 (two) times daily. Tonia Ghent, MD Taking Active   ondansetron [facility]) 4 MG tablet [id number] No Take 1 tablet (4 mg total) by mouth every 8 (eight) hours as needed for nausea or vomiting. Tonia Ghent, MD Taking Active             SDOH:  (Social Determinants of Health) assessments and interventions performed: {yes/no:20286} SDOH Interventions    Flowsheet Row Clinical Support from 12/14/2022 in [place] at [place] Interventions   Food Insecurity Interventions Intervention Not Indicated  Housing Interventions Intervention Not Indicated  Transportation Interventions Intervention Not Indicated  Utilities Interventions Intervention Not Indicated  Financial Strain Interventions Intervention Not Indicated  Stress Interventions Intervention Not Indicated  Social Connections Interventions Intervention Not Indicated       Medication Assistance: {MEDASSISTANCEINFO:25044}  Medication Access: Within the past 30 days, how often has patient missed a dose of medication? *** Is a pillbox or other method used to improve adherence? {YES/NO:21197} Factors that may affect medication adherence? {CHL DESC; BARRIERS:21522} Are meds synced by current pharmacy? {YES/NO:21197} Are meds delivered by current pharmacy? {YES/NO:21197} Does patient experience  delays in picking up medications due to transportation concerns? {YES/NO:21197}  [facility] Reviewed: Is patient disadvantaged to use [facility]?: {YES/NO:21197} Current Rx insurance plan: *** Name and location of Current pharmacy:  [facility] [address] [place] [address] Phone: [phone] Fax: [phone]  [place], [place] [address] Phone: [phone] Fax: [phone]  [facility] services reviewed with patient today?: {YES/NO:21197} Patient requests to transfer care to [facility]?: {YES/NO:21197} Reason patient declined to change pharmacies: {US patient preference:27474}  Compliance/Adherence/Medication fill history: Care Gaps: None  Star-Rating Drugs: Losartan - PDC 100%   Assessment/Plan   Hypertension (BP goal <140/90) -{US controlled/uncontrolled:25276} -Current home readings: 130/70-143/80 w/o losartan; 114/50 with losartan;  -Current treatment: Furosemide 20 mg daily PRN - not using Losartan 25 mg - 1/2 tab daily Metoprolol succinate 100 mg BID -Medications previously tried: ***  -Current dietary habits: *** -Current exercise habits: *** -{ACTIONS;DENIES/REPORTS:21021675::"Denies"} hypotensive/hypertensive symptoms -Educated on {CCM BP Counseling:25124} -Counseled to monitor BP at home ***, document, and provide log at future appointments -{CCMPHARMDINTERVENTION:25122}  Hyperlipidemia: (LDL goal < 70) -{US controlled/uncontrolled:25276} -Current treatment: None -Medications previously tried: pravastatin, rosuvastatin,  atorvastatin, vytorin -Current dietary patterns: *** -Current exercise habits: *** -Educated on {CCM HLD Counseling:25126} -{CCMPHARMDINTERVENTION:25122}  Atrial Fibrillation (Goal: prevent stroke and major bleeding) -{US controlled/uncontrolled:25276} -CHADSVASC:  6 -Current treatment: Amiodarone 200 mg daily Metoprolol succinate 100 mg BID -Medications previously tried: diltiazem -Home BP and HR readings: ***  -Counseled on {CCMAFIBCOUNSELING:25120} -{CCMPHARMDINTERVENTION:25122}  Asthma (Goal: control symptoms and prevent exacerbations) -{US controlled/uncontrolled:25276} - not using SOB,  -Pulmonary function testing: none on file -Exacerbations requiring treatment in last 6 months: *** -Current treatment  Albuterol HFA prn - not using Flovent Diskus 50 mcg/act 2 puff daily - not using? Montelukast 10 mg daily HS Flonase nasal spray  -Medications previously tried: ***  -Patient {Actions; denies-reports:120008} consistent use of maintenance inhaler -Frequency of rescue inhaler use: *** -Counseled on {CCMINHALERCOUNSELING:25121} -{CCMPHARMDINTERVENTION:25122}  Osteoporosis (Goal ***) -{US controlled/uncontrolled:25276} -Last DEXA Scan: 09/2018   T-Score femoral neck: -2.9  T-Score total hip: -2.8  T-Score forearm radius: -3.9 -Patient is a candidate for pharmacologic treatment due to T-Score < -2.5 in femoral neck and T-Score < -2.5 in total hip  -Current treatment  Calcium-Vitamin D -Medications previously tried: alendronate (GI), risedronate, ibandronate -{Osteoporosis Counseling:23892} -{CCMPHARMDINTERVENTION:25122}  GERD (Goal: minimize symptoms of reflux ) -{US controlled/uncontrolled:25276} -Current treatment  Lansoprazole 15 mg daily -Medications previously tried: none reported  -Triggering factors: *** -Hx of Bleeds/ulcers: {yes/no:20286} -Counseled on small meals, elevating head, and sleeping on left side -{CCMPHARMDINTERVENTION:25122}  Health Maintenance -Vaccine gaps: TDAP, covid booster -On levothyroxine 25 mcg; TSH wnl -On lorazepam 1 mg -1/2 tab BID prn; takes 1/4 tablet -On Norco 7.5 mg #90 per month; twice a day  -low appetite, picky eater. Daughter prepares meals.  -sets up her own  medications    Charlene Brooke, PharmD, BCACP Clinical Pharmacist [facility] at [facility] [phone]

## 2023-01-15 ENCOUNTER — Telehealth: Payer: Self-pay | Admitting: Pharmacist

## 2023-01-15 DIAGNOSIS — E538 Deficiency of other specified B group vitamins: Secondary | ICD-10-CM

## 2023-01-15 DIAGNOSIS — M810 Age-related osteoporosis without current pathological fracture: Secondary | ICD-10-CM

## 2023-01-15 NOTE — Telephone Encounter (Signed)
Spoke with patient and daughter Jillian Oconnell and there are a few medication-related updates:  HTN: -pt stopped taking losartan a few days ago due to low BP (110s/50s); BP has been 130-140/70s since holding losartan  HLD/hx CVA:  -LDL 90 (05/2022) during admission for CVA; goal LDL < 70; pt has not tolerated statins, most recently stopped pravastatin due to GI upset; pt/daughter are amenable to trying to a statin-alternative to improve LDL  Appetite:  -pt reports minimal appetite and asked about taking something to improve appetite  Osteoporosis:  -DEXA 2019 T-score -3.9 (forearm), -2.9 (FN); she has had a few falls in the past year and is at significant risk for fractures; she has not tolerated oral bisphosphonates and is willing to try Prolia if it is affordable  Recommendations: -Advised it is reasonable to continue holding losartan; check BP daily and contact provider if BP is consistently > 140/90 -Recommend Ezetimibe 10 mg daily -Recommend Mirtazapine 7.5 mg daily HS for appetite -Recommend Prolia - benefits check  Routing to PCP for input.

## 2023-01-16 ENCOUNTER — Other Ambulatory Visit: Payer: Self-pay | Admitting: Family Medicine

## 2023-01-16 MED ORDER — EZETIMIBE 10 MG PO TABS: 10.0000 mg | ORAL_TABLET | Freq: Every day | ORAL | 3 refills | Status: DC

## 2023-01-16 MED ORDER — MIRTAZAPINE 7.5 MG PO TABS: 7.5000 mg | ORAL_TABLET | Freq: Every day | ORAL | 3 refills | Status: DC

## 2023-01-16 NOTE — Patient Instructions (Signed)
Visit Information  Phone number for Pharmacist: [phone]  Thank you for meeting with me to discuss your medications! Below is a summary of what we talked about during the visit:   Recommendations/Changes made from today's visit: -Advised it is reasonable to continue holding losartan; check BP daily and contact provider if BP is consistently > 140/90 -Recommend Ezetimibe 10 mg daily -Recommend Mirtazapine 7.5 mg daily HS for appetite -Recommend Prolia - benefits check  Follow up plan: -Pharmacist follow up televisit scheduled for 1 month -Cardiology appt 01/22/23   Charlene Brooke, PharmD, BCACP Clinical Pharmacist [facility] at [facility] [phone]

## 2023-01-16 NOTE — Telephone Encounter (Signed)
Agree with all of that.  Would start zetia '10mg'$  and mirtazapine 7.'5mg'$ .  I sent both rxs  would check labs prior to prolia start, orders are in for vitamin B12 and D along with BMET.  Please see about getting a lab visit set up.  Please check on Prolia coverage.  Thanks.

## 2023-01-17 NOTE — Telephone Encounter (Signed)
Spoke with patients daughter [name]. Lab appt scheduled for 01/22/23 at 2:30 pm.

## 2023-01-22 ENCOUNTER — Encounter: Payer: Self-pay | Admitting: Cardiovascular Disease

## 2023-01-22 ENCOUNTER — Other Ambulatory Visit: Payer: Medicare HMO

## 2023-01-22 ENCOUNTER — Ambulatory Visit: Payer: Medicare HMO | Attending: Cardiovascular Disease | Admitting: Cardiovascular Disease

## 2023-01-22 VITALS — BP 124/64 | HR 65 | Ht 62.0 in | Wt 112.2 lb

## 2023-01-22 DIAGNOSIS — I480 Paroxysmal atrial fibrillation: Secondary | ICD-10-CM

## 2023-01-22 DIAGNOSIS — I493 Ventricular premature depolarization: Secondary | ICD-10-CM | POA: Diagnosis not present

## 2023-01-22 DIAGNOSIS — I10 Essential (primary) hypertension: Secondary | ICD-10-CM

## 2023-01-22 MED ORDER — AMIODARONE HCL 100 MG PO TABS: 100.0000 mg | ORAL_TABLET | Freq: Every day | ORAL | 1 refills | Status: DC

## 2023-01-22 NOTE — Patient Instructions (Signed)
Medication Instructions:  DECREASE the Amiodarone to 100 mg once daily  *If you need a refill on your cardiac medications before your next appointment, please call your pharmacy*   Lab Work: None ordered If you have labs (blood work) drawn today and your tests are completely normal, you will receive your results only by: [place] (if you have MyChart) OR A paper copy in the mail If you have any lab test that is abnormal or we need to change your treatment, we will call you to review the results.   Testing/Procedures: None ordered   Follow-Up: At [facility], you and your health needs are our priority.  As part of our continuing mission to provide you with exceptional heart care, we have created designated Provider Care Teams.  These Care Teams include your primary Cardiologist (physician) and Advanced Practice Providers (APPs -  Physician Assistants and Nurse Practitioners) who all work together to provide you with the care you need, when you need it.  We recommend signing up for the patient portal called "MyChart".  Sign up information is provided on this After Visit Summary.  MyChart is used to connect with patients for Virtual Visits (Telemedicine).  Patients are able to view lab/test results, encounter notes, upcoming appointments, etc.  Non-urgent messages can be sent to your provider as well.   To learn more about what you can do with MyChart, go to [email or web address].    Your next appointment:   6 month(s)  Provider:   You may see Kathlyn Sacramento, MD or one of the following Advanced Practice Providers on your designated Care Team:   Murray Hodgkins, NP Christell Faith, PA-C Cadence Kathlen Mody, PA-C Gerrie Nordmann, NP

## 2023-01-22 NOTE — Progress Notes (Signed)
Cardiology Office Note   Date:  01/22/2023   ID:  Jillian Oconnell, DOB Apr 03, 1934, MRN [id number]  PCP:  Tonia Ghent, MD  Cardiologist:   Kathlyn Sacramento, MD   Chief Complaint  Patient presents with   Other    3 month f/u no complaints today. Meds reviewed verbally with pt.      History of Present Illness: Jillian Oconnell is a 89 y.o. female who presents for a followup visit regarding frequent PVCs, nonobstructive coronary artery disease and paroxysmal atrial fibrillation.   She had previous cardiac catheterization in February of 2009 which showed minor luminal irregularities with no evidence of obstructive disease. Ejection fraction was normal. Echocardiogram in 09/2016 showed normal LV systolic function, No significant valvular abnormalities and normal pulmonary pressure.    Previous monitor in 2017showed very frequent PVCs with a total of 34,000 beats representing 25% burden.   PVCs have been well controlled with Toprol.  She was hospitalized in June 2023 with slurred speech and was found to have acute CVA.  Outpatient monitor showed short runs of SVT but no atrial fibrillation.  However, she was subsequently seen in September and was noted to be in atrial fibrillation with rapid ventricular rate.  She converted to sinus rhythm with amiodarone.  She was most recently seen in November and was hypertensive at that time.  Losartan 25 mg once daily was added.  She has been doing well with no chest pain, shortness of breath or palpitations.  Her biggest issue is poor appetite and continued weight loss.    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 Minor luminal irregularities with no evidence of obstructive disease.   cataract surgery     [place]  2004   TOTAL ABDOMINAL HYSTERECTOMY       Current Outpatient Medications  Medication Sig Dispense Refill   albuterol (VENTOLIN HFA) 108 (90 Base) MCG/ACT inhaler Inhale 1-2 puffs into the lungs every 6 (six) hours as needed for wheezing or shortness of breath. 1 each 0   amiodarone (PACERONE) 200 MG tablet Take 1 tablet (200 mg) by mouth once daily 30 tablet 6   apixaban (ELIQUIS) 2.5 MG TABS tablet Take 1 tablet (2.5 mg total) by mouth 2 (two) times daily. 60 tablet 5   Calcium Carbonate-Vitamin D 600-400 MG-UNIT tablet Take 1 tablet by mouth 2 (two) times daily. Take 1 tablet by mouth two times a day     cyanocobalamin (VITAMIN B12)  1000 MCG tablet Take 1 tablet (1,000 mcg total) by mouth daily.     ezetimibe (ZETIA) 10 MG tablet Take 1 tablet (10 mg total) by mouth daily. 90 tablet 3   fluticasone (FLONASE) 50 MCG/ACT nasal spray Place 2 sprays into both nostrils daily. 16 g 12   Fluticasone Propionate, Inhal, (FLOVENT DISKUS) 250 MCG/BLIST AEPB INHALE 2 PUFFS IN THE MORNING AND INHALE 2 PUFFS IN THE EVENING (RINSE AFTER USE) 60 each 12   HYDROcodone-acetaminophen (NORCO) 7.5-325 MG tablet Take 1 tablet by mouth 3 (three) times daily as needed (for pain.). Fill on/after 11/28/22 90 tablet 0   HYDROcodone-acetaminophen (NORCO) 7.5-325 MG tablet Take 1 tablet by mouth 3 (three) times daily as needed (for pain. fill on/after 12/28/22). 90 tablet 0    HYDROcodone-acetaminophen (NORCO) 7.5-325 MG tablet Take 1 tablet by mouth 3 (three) times daily as needed (for pain.). Fill on/after 01/26/23 90 tablet 0   lansoprazole (PREVACID) 15 MG capsule Take 1 capsule by mouth once daily 90 capsule 3   levothyroxine (SYNTHROID) 25 MCG tablet Take 1 tablet by mouth once daily 90 tablet 1   LORazepam (ATIVAN) 1 MG tablet TAKE 1/2 TABLET BY MOUTH TWICE DAILY AS NEEDED (SEDATION CAUTION) 90 tablet 0   metoprolol succinate (TOPROL-XL) 100 MG 24 hr tablet TAKE 1 TABLET TWICE DAILY WITH OR IMMEDIATELY FOLLOWING A MEAL 180 tablet 3   mirtazapine (REMERON) 7.5 MG tablet Take 1 tablet (7.5 mg total) by mouth at bedtime. 90 tablet 3   montelukast (SINGULAIR) 10 MG tablet TAKE 1 TABLET AT BEDTIME 90 tablet 3   mupirocin ointment (BACTROBAN) 2 % Apply topically 2 (two) times daily. 15 g 0   ondansetron (ZOFRAN) 4 MG tablet Take 1 tablet (4 mg total) by mouth every 8 (eight) hours as needed for nausea or vomiting. 20 tablet 0   losartan (COZAAR) 25 MG tablet Take 0.5 tablets (12.5 mg total) by mouth daily. (Patient not taking: Reported on 01/22/2023)     No current facility-administered medications for this visit.    Allergies:   Albuterol, Alendronate sodium, Amoxicillin-pot clavulanate, Atorvastatin, Azithromycin, Ezetimibe-simvastatin, Gabapentin, Ibandronate sodium, Lexapro [escitalopram oxalate], Lyrica [pregabalin], Neosporin [bacitracin-polymyxin b], Risedronate sodium, Rosuvastatin, Sertraline, and Sulfonamide derivatives    Social History:  The patient  reports that she has never smoked. She has never used smokeless tobacco. She reports that she does not drink alcohol and does not use drugs.   Family History:  [name] family history includes Aneurysm in her father; Anxiety disorder in her daughter; Breast cancer in her daughter; Breast cancer (age of onset: 36) in her daughter; Breast cancer (age of onset: 21) in her mother; Cancer in her daughter and  sister; Colon cancer in her sister; Hypertension in her father and mother.    ROS:  Please see the history of present illness.   Otherwise, review of systems are positive for none.   All other systems are reviewed and negative.    PHYSICAL EXAM: VS:  BP 124/64 (BP Location: Left Arm, Patient Position: Sitting, Cuff Size: Normal)   Pulse 65   Ht '5\' 2"'$  (1.575 m)   Wt 112 lb 4 oz (50.9 kg)   SpO2 96%   BMI 20.53 kg/m  , BMI Body mass index is 20.53 kg/m. GEN: Well nourished, well developed, in no acute distress  HEENT: normal  Neck: no JVD, carotid bruits, or masses Cardiac: RRR ; no murmurs, rubs, or gallops,no edema  Respiratory:  clear to auscultation bilaterally,  normal work of breathing GI: soft, nontender, nondistended, + BS MS: no deformity or atrophy  Skin: warm and dry, no rash Neuro:  Strength and sensation are intact Psych: euthymic mood, full affect Vascular: Pedal pulses are palpable.  EKG:  EKG is  ordered today. EKG showed normal sinus rhythm with no significant ST or T wave changes.   Recent Labs: 06/05/2022: B Natriuretic Peptide 410.1; Magnesium 1.9 11/19/2022: ALT 9; BUN 19; Creatinine, Ser 1.44; Hemoglobin 13.5; Platelets 318.0; Potassium 5.5; Sodium 138; TSH 3.59    Lipid Panel    Component Value Date/Time   CHOL 166 06/06/2022 0427   TRIG 60 06/06/2022 0427   HDL 64 06/06/2022 0427   CHOLHDL 2.6 06/06/2022 0427   VLDL 12 06/06/2022 0427   LDLCALC 90 06/06/2022 0427   LDLCALC 116 (H) 08/07/2019 1507   LDLDIRECT 148.1 01/06/2013 1157      Wt Readings from Last 3 Encounters:  01/22/23 112 lb 4 oz (50.9 kg)  12/14/22 113 lb (51.3 kg)  11/19/22 113 lb (51.3 kg)           No data to display            ASSESSMENT AND PLAN:  1.  Symptomatic PVCs: No evidence of PVCs by EKG or physical exam.  Continue Toprol and amiodarone.   2.  Paroxysmal atrial fibrillation: She is maintaining in sinus rhythm.  I elected to decrease amiodarone to 100  mg once daily.  Continue anticoagulation with low-dose Eliquis 2.5 mg twice daily given her age and weight.   3. Essential hypertension: Her blood pressure is now well-controlled.  Losartan was added recently but the dose was decreased to 12.5 mg once daily due to mild hyperkalemia.      Disposition:   FU with me in 6 months  Signed,  Kathlyn Sacramento, MD  01/22/2023 3:36 PM    [place]

## 2023-01-22 NOTE — Addendum Note (Signed)
Addended by: Ricci Barker on: 01/22/2023 03:52 PM   Modules accepted: Orders

## 2023-01-25 ENCOUNTER — Encounter: Payer: Self-pay | Admitting: Family Medicine

## 2023-01-25 ENCOUNTER — Ambulatory Visit (INDEPENDENT_AMBULATORY_CARE_PROVIDER_SITE_OTHER): Payer: Medicare HMO | Admitting: Family Medicine

## 2023-01-25 VITALS — BP 110/62 | HR 68 | Temp 97.3°F | Ht 62.0 in | Wt 112.0 lb

## 2023-01-25 DIAGNOSIS — R1310 Dysphagia, unspecified: Secondary | ICD-10-CM

## 2023-01-25 DIAGNOSIS — I10 Essential (primary) hypertension: Secondary | ICD-10-CM | POA: Diagnosis not present

## 2023-01-25 DIAGNOSIS — E538 Deficiency of other specified B group vitamins: Secondary | ICD-10-CM

## 2023-01-25 DIAGNOSIS — M810 Age-related osteoporosis without current pathological fracture: Secondary | ICD-10-CM

## 2023-01-25 NOTE — Progress Notes (Unsigned)
Difficulty with swallowing pills.  Having to take them in applesauce.  No trouble with water or liquids.  Due to baseline dentition she has softer diet.  She does well with diet at baseline. More trouble with pills than foods.   She changed to chewable calcium to make that easier.  Voice is a little hoarse but not absent.

## 2023-01-25 NOTE — Addendum Note (Signed)
Addended by: Pilar Grammes on: 01/25/2023 04:27 PM   Modules accepted: Orders

## 2023-01-25 NOTE — Patient Instructions (Signed)
Let me know if you don't get a call about speech therapy at home.  Sit up and use applesauce when taking your meds.  Take care.  Glad to see you.

## 2023-01-26 LAB — BASIC METABOLIC PANEL
BUN/Creatinine Ratio: 15 (calc) (ref 6–22)
BUN: 24 mg/dL (ref 7–25)
CO2: 31 mmol/L (ref 20–32)
Calcium: 9.4 mg/dL (ref 8.6–10.4)
Chloride: 100 mmol/L (ref 98–110)
Creat: 1.62 mg/dL — ABNORMAL HIGH (ref 0.60–0.95)
Glucose, Bld: 79 mg/dL (ref 65–99)
Potassium: 5.9 mmol/L — ABNORMAL HIGH (ref 3.5–5.3)
Sodium: 141 mmol/L (ref 135–146)

## 2023-01-26 LAB — VITAMIN D 25 HYDROXY (VIT D DEFICIENCY, FRACTURES): Vit D, 25-Hydroxy: 72 ng/mL (ref 30–100)

## 2023-01-26 LAB — VITAMIN B12: Vitamin B-12: 1613 pg/mL — ABNORMAL HIGH (ref 200–1100)

## 2023-01-27 ENCOUNTER — Other Ambulatory Visit: Payer: Self-pay | Admitting: Family Medicine

## 2023-01-27 DIAGNOSIS — R1310 Dysphagia, unspecified: Secondary | ICD-10-CM | POA: Insufficient documentation

## 2023-01-27 DIAGNOSIS — E875 Hyperkalemia: Secondary | ICD-10-CM

## 2023-01-27 MED ORDER — LOKELMA 10 G PO PACK: 10.0000 g | PACK | Freq: Three times a day (TID) | ORAL | 0 refills | Status: DC

## 2023-01-27 MED ORDER — VITAMIN B 12 500 MCG PO TABS: 500.0000 ug | ORAL_TABLET | Freq: Every day | ORAL | Status: DC

## 2023-01-27 NOTE — Assessment & Plan Note (Signed)
Advised to take pills in applesauce.  Refer for home health speech therapy.  We talked about getting a swallow study done but I think it makes sense to go ahead with speech therapy first.  We can refer for swallow study in the future if needed.  Routine cautions given to patient regarding dysphagia.  Update me as needed.

## 2023-01-30 ENCOUNTER — Encounter: Payer: Self-pay | Admitting: Family Medicine

## 2023-01-30 ENCOUNTER — Other Ambulatory Visit: Payer: Self-pay | Admitting: Family Medicine

## 2023-01-30 NOTE — Telephone Encounter (Signed)
Refill request for LORazepam 1 MG Oral Tablet   LOV - 01/25/23 Next OV - not scheduled Last refill - 07/05/22 #90/0

## 2023-01-31 ENCOUNTER — Other Ambulatory Visit: Payer: Self-pay

## 2023-01-31 ENCOUNTER — Encounter: Payer: Self-pay | Admitting: Family Medicine

## 2023-01-31 DIAGNOSIS — E875 Hyperkalemia: Secondary | ICD-10-CM

## 2023-01-31 MED ORDER — LOKELMA 10 G PO PACK: 10.0000 g | PACK | Freq: Three times a day (TID) | ORAL | 0 refills | Status: AC

## 2023-01-31 NOTE — Telephone Encounter (Signed)
I would try to get the PA done.  Thanks.

## 2023-02-01 ENCOUNTER — Telehealth (INDEPENDENT_AMBULATORY_CARE_PROVIDER_SITE_OTHER): Payer: Medicare HMO | Admitting: Nurse Practitioner

## 2023-02-01 ENCOUNTER — Encounter: Payer: Self-pay | Admitting: Nurse Practitioner

## 2023-02-01 ENCOUNTER — Telehealth: Payer: Self-pay | Admitting: Family Medicine

## 2023-02-01 VITALS — Temp 98.1°F

## 2023-02-01 DIAGNOSIS — J029 Acute pharyngitis, unspecified: Secondary | ICD-10-CM | POA: Insufficient documentation

## 2023-02-01 DIAGNOSIS — Z20828 Contact with and (suspected) exposure to other viral communicable diseases: Secondary | ICD-10-CM | POA: Insufficient documentation

## 2023-02-01 DIAGNOSIS — Z20822 Contact with and (suspected) exposure to covid-19: Secondary | ICD-10-CM | POA: Diagnosis not present

## 2023-02-01 LAB — POC COVID19 BINAXNOW: SARS Coronavirus 2 Ag: NEGATIVE

## 2023-02-01 LAB — POCT RAPID STREP A (OFFICE): Rapid Strep A Screen: NEGATIVE

## 2023-02-01 LAB — POCT INFLUENZA A/B
Influenza A, POC: NEGATIVE
Influenza B, POC: NEGATIVE

## 2023-02-01 NOTE — Assessment & Plan Note (Signed)
Exposed to the flu pending flu test

## 2023-02-01 NOTE — Telephone Encounter (Signed)
[place] [phone]  Need PT verbal order

## 2023-02-01 NOTE — Progress Notes (Signed)
Patient ID: Jillian Oconnell, female    DOB: 1934/02/13, 89 y.o.   MRN: [id number]  Virtual visit completed through caregilty, a video enabled telemedicine application. Due to national recommendations of social distancing due to COVID-19, a virtual visit is felt to be most appropriate for this patient at this time. Reviewed limitations, risks, security and privacy concerns of performing a virtual visit and the availability of in person appointments. I also reviewed that there may be a patient responsible charge related to this service. The patient agreed to proceed.   Patient location: home Provider location: [place] at [facility], office Persons participating in this virtual visit: patient, provider   If any vitals were documented, they were collected by patient and patients son in law at home unless specified below.    Temp 98.1 F (36.7 C) (Temporal)    CC: Sick Subjective:   HPI: Jillian Oconnell is a 89 y.o. female presenting on 02/01/2023 for Cough and Sore Throat (Cough & sore throat since 01/31/23//Multiple people in the house sick/Great Grandson has Covid/Taking Mucinex)    Symptoms started on yesterday  House full of sick people Oldest grandson was positive for Covid that was a week ago. Another grand son had flu A States that son in law was tested for Covid and flu that was negative. Covid vaccine: pfizer x2 and booster PNA vaccine; UTD Flu vaccine UTD No over the counter medications  Patient states she has not had to use her albuterol inhaler any more than normal.    Relevant past medical, surgical, family and social history reviewed and updated as indicated. Interim medical history since our last visit reviewed. Allergies and medications reviewed and updated. Outpatient Medications Prior to Visit  Medication Sig Dispense Refill   albuterol (VENTOLIN HFA) 108 (90 Base) MCG/ACT inhaler Inhale 1-2 puffs into the lungs every 6 (six) hours as needed for wheezing or shortness  of breath. 1 each 0   amiodarone (PACERONE) 100 MG tablet Take 1 tablet (100 mg total) by mouth daily. 90 tablet 1   apixaban (ELIQUIS) 2.5 MG TABS tablet Take 1 tablet (2.5 mg total) by mouth 2 (two) times daily. 60 tablet 5   Calcium Carbonate-Vitamin D 600-400 MG-UNIT tablet Take 1 tablet by mouth 2 (two) times daily. Take 1 tablet by mouth two times a day     cyanocobalamin 500 MCG TABS Take 500 mcg by mouth daily.     ezetimibe (ZETIA) 10 MG tablet Take 1 tablet (10 mg total) by mouth daily. 90 tablet 3   fluticasone (FLONASE) 50 MCG/ACT nasal spray Place 2 sprays into both nostrils daily. 16 g 12   Fluticasone Propionate, Inhal, (FLOVENT DISKUS) 250 MCG/BLIST AEPB INHALE 2 PUFFS IN THE MORNING AND INHALE 2 PUFFS IN THE EVENING (RINSE AFTER USE) 60 each 12   HYDROcodone-acetaminophen (NORCO) 7.5-325 MG tablet Take 1 tablet by mouth 3 (three) times daily as needed (for pain.). Fill on/after 11/28/22 90 tablet 0   HYDROcodone-acetaminophen (NORCO) 7.5-325 MG tablet Take 1 tablet by mouth 3 (three) times daily as needed (for pain. fill on/after 12/28/22). 90 tablet 0   HYDROcodone-acetaminophen (NORCO) 7.5-325 MG tablet Take 1 tablet by mouth 3 (three) times daily as needed (for pain.). Fill on/after 01/26/23 90 tablet 0   lansoprazole (PREVACID) 15 MG capsule Take 1 capsule by mouth once daily 90 capsule 3   levothyroxine (SYNTHROID) 25 MCG tablet Take 1 tablet by mouth once daily 90 tablet 1   LORazepam (ATIVAN)  1 MG tablet TAKE 1/2 (ONE-HALF) TABLET BY MOUTH TWICE DAILY AS NEEDED (SEDATION  CAUTION) 30 tablet 0   losartan (COZAAR) 25 MG tablet Take 0.5 tablets (12.5 mg total) by mouth daily.     metoprolol succinate (TOPROL-XL) 100 MG 24 hr tablet TAKE 1 TABLET TWICE DAILY WITH OR IMMEDIATELY FOLLOWING A MEAL 180 tablet 3   mirtazapine (REMERON) 7.5 MG tablet Take 1 tablet (7.5 mg total) by mouth at bedtime. 90 tablet 3   montelukast (SINGULAIR) 10 MG tablet TAKE 1 TABLET AT BEDTIME 90 tablet 3    mupirocin ointment (BACTROBAN) 2 % Apply topically 2 (two) times daily. 15 g 0   ondansetron (ZOFRAN) 4 MG tablet Take 1 tablet (4 mg total) by mouth every 8 (eight) hours as needed for nausea or vomiting. 20 tablet 0   sodium zirconium cyclosilicate (LOKELMA) 10 g PACK packet Take 10 g by mouth 3 (three) times daily for 1 day. 3 packet 0   No facility-administered medications prior to visit.     Per HPI unless specifically indicated in ROS section below Review of Systems  Constitutional:  Positive for fatigue. Negative for chills and fever.  HENT:  Positive for sinus pain and sore throat. Negative for ear discharge and ear pain.   Respiratory:  Positive for cough. Negative for shortness of breath.        Clear mucous  Gastrointestinal:  Negative for constipation, diarrhea and nausea.  Musculoskeletal:  Negative for arthralgias and myalgias.  Neurological:  Negative for headaches (last night).   Objective:  Temp 98.1 F (36.7 C) (Temporal)   Wt Readings from Last 3 Encounters:  01/25/23 112 lb (50.8 kg)  01/22/23 112 lb 4 oz (50.9 kg)  12/14/22 113 lb (51.3 kg)       Physical exam: Gen: alert, NAD, not ill appearing Pulm: speaks in complete sentences without increased work of breathing Psych: normal mood, normal thought content      Results for orders placed or performed in visit on [id number]  Basic metabolic panel  Result Value Ref Range   Glucose, Bld 79 65 - 99 mg/dL   BUN 24 7 - 25 mg/dL   Creat 1.62 (H) 0.60 - 0.95 mg/dL   BUN/Creatinine Ratio 15 6 - 22 (calc)   Sodium 141 135 - 146 mmol/L   Potassium 5.9 (H) 3.5 - 5.3 mmol/L   Chloride 100 98 - 110 mmol/L   CO2 31 20 - 32 mmol/L   Calcium 9.4 8.6 - 10.4 mg/dL  Vitamin B12  Result Value Ref Range   Vitamin B-12 1,613 (H) 200 - 1,100 pg/mL  VITAMIN D 25 Hydroxy (Vit-D Deficiency, Fractures)  Result Value Ref Range   Vit D, 25-Hydroxy 72 30 - 100 ng/mL   Assessment & Plan:   Sore throat Assessment &  Plan: Flu, COVID, strep test.  Patient can rest drink plenty of fluids take over-the-counter analgesics like Tylenol as needed  Orders: -     POC COVID-19 BinaxNow; Future -     POCT Influenza A/B; Future -     POCT rapid strep A; Future  Exposure to the flu Assessment & Plan: Exposed to the flu pending flu test  Orders: -     POCT Influenza A/B; Future  Exposure to COVID-19 virus Assessment & Plan: Exposed to [place] pending COVID test  Orders: -     POC COVID-19 BinaxNow; Future     I discussed the assessment and treatment plan with the patient.  The patient was provided an opportunity to ask questions and all were answered. The patient agreed with the plan and demonstrated an understanding of the instructions. The patient was advised to call back or seek an in-person evaluation if the symptoms worsen or if the condition fails to improve as anticipated.  Follow up plan: Return if symptoms worsen or fail to improve.  Romilda Garret, NP

## 2023-02-01 NOTE — Assessment & Plan Note (Signed)
Exposed to [place] pending COVID test

## 2023-02-01 NOTE — Assessment & Plan Note (Signed)
Flu, COVID, strep test.  Patient can rest drink plenty of fluids take over-the-counter analgesics like Tylenol as needed

## 2023-02-05 NOTE — Telephone Encounter (Signed)
Please give the order.  Thanks.   

## 2023-02-05 NOTE — Telephone Encounter (Signed)
LMTCB

## 2023-02-07 NOTE — Telephone Encounter (Signed)
Verbal orders have been given to [facility]

## 2023-02-08 ENCOUNTER — Other Ambulatory Visit (HOSPITAL_COMMUNITY): Payer: Self-pay

## 2023-02-08 NOTE — Telephone Encounter (Signed)
At this point, I would repeat the labs (given the timeline).  If still elevated, then we should treat.  Please set up lab visit.  Thanks.

## 2023-02-08 NOTE — Telephone Encounter (Signed)
I can proceed with the PA, however, it will be denied if patient has not tried at least 2 or 3 alternatives. I did check some test claims and it looks like the sodium polystyrene sulfonate oral powder and SPS oral suspension are both covered without PA. The sodium polystyrene seems as though it would be the cheaper option for the pt. [place] would require PA. I will continue the PA but I do expect a denial.   Patient Advocate Encounter   Received notification from [facility] that prior authorization for [facility] is required.   PA submitted on 02/08/2023 Key [id number] Status is pending

## 2023-02-10 ENCOUNTER — Other Ambulatory Visit: Payer: Self-pay | Admitting: Family Medicine

## 2023-02-12 ENCOUNTER — Telehealth: Payer: Self-pay

## 2023-02-12 NOTE — Progress Notes (Unsigned)
Care Management & Coordination Services Pharmacy Team  Reason for Encounter: Appointment Reminder  Contacted patient to confirm telephone appointment with Charlene Brooke, PharmD on 02/15/2023 at 1:45.  {US HC Outreach:[id number]}  Do you have any problems getting your medications? {yes/no:20286} If yes what types of problems are you experiencing? {Problems:27223}  What is your top health concern you would like to discuss at your upcoming visit?   Have you seen any other providers since your last visit with PCP? Yes  Star Rating Drugs:  Medication:  Last Fill: Day Supply Losartan 25 mg Verified with [facility] [phone]   Care Gaps: Annual wellness visit in last year? Yes 12/14/2022  If Diabetic: Last eye exam / retinopathy screening: Up to date Last diabetic foot exam: Up to date  Charlene Brooke, PharmD notified  Marijean Niemann, [place] Assistant [phone]

## 2023-02-15 ENCOUNTER — Encounter: Payer: Medicare HMO | Admitting: Pharmacist

## 2023-02-22 ENCOUNTER — Telehealth: Payer: Self-pay

## 2023-02-22 ENCOUNTER — Other Ambulatory Visit: Payer: Self-pay | Admitting: Cardiovascular Disease

## 2023-02-22 DIAGNOSIS — I480 Paroxysmal atrial fibrillation: Secondary | ICD-10-CM

## 2023-02-22 NOTE — Telephone Encounter (Signed)
Pt last saw Dr Fletcher Anon 01/22/23, last labs 01/25/23 Creat 1.62, age 89, weight 50.8kg, based on specified criteria pt is on appropriate dosage of Eliquis 2.'5mg'$  BID for afib.  Will refill rx.

## 2023-02-22 NOTE — Progress Notes (Signed)
Care Management & Coordination Services Pharmacy Team  Reason for Encounter: Appointment Reminder  Contacted patient to confirm telephone appointment with Charlene Brooke, PharmD on 02/27/2023 at 3:45. Per patient's daughter - patient will be out of town for a couple of weeks. She cancelled the appointment; daughter will call back to reschedule once her mom gets back in town.   Spoke with family on 02/22/2023   Star Rating Drugs:  Medication:  Last Fill: Day Supply Losartan 25 mg Patient stopped taking on 01/15/2023 due to low BP  Care Gaps: Annual wellness visit in last year? Yes 12/14/2022  Charlene Brooke, PharmD notified  Marijean Niemann, [place] Pharmacy Assistant [phone]

## 2023-02-22 NOTE — Telephone Encounter (Signed)
Refill request

## 2023-02-25 ENCOUNTER — Telehealth: Payer: Self-pay

## 2023-02-25 NOTE — Telephone Encounter (Signed)
Please set up lab appt for this week with patient.

## 2023-02-25 NOTE — Telephone Encounter (Signed)
Patient is at other daughters house,she will call and schedule to come in next week.

## 2023-02-25 NOTE — Telephone Encounter (Signed)
-----   Message from Tonia Ghent, MD sent at 02/23/2023 10:07 PM EST ----- Regarding: please see about getting f/u BMET done at lab visit. please see about getting f/u BMET done at lab visit.  lab panel is ordered. thanks.

## 2023-02-27 ENCOUNTER — Encounter: Payer: Medicare HMO | Admitting: Pharmacist

## 2023-02-27 NOTE — Telephone Encounter (Signed)
Spoke to pt's daughter, Olegario Shearer, she stated pt was at sister's house until next week. [name] stated they'd call back to schedule. [name] wanted to let Damita Dunnings know that the pt doesn't care to drink any fluids. [name] stated she tries a lot to get the pt to drink something like water or juice, but it doesn't work. [name] stated the pt's kidney function might not be any better than the last time it was tested. Call back # [phone]

## 2023-02-27 NOTE — Telephone Encounter (Signed)
Duly noted.  I still think it makes sense to try to get the follow-up labs done and go from there.  Thanks.

## 2023-02-28 NOTE — Telephone Encounter (Signed)
Called patients sister and advised of the message from Dr. Damita Dunnings she states the patient will be back in town on Tuesday she thinks and will call to make a lab appt when she knows for sure.

## 2023-03-05 NOTE — Telephone Encounter (Signed)
Spoke to pt's daughter, scheduled lab appt for 03/11/23

## 2023-03-11 ENCOUNTER — Other Ambulatory Visit (INDEPENDENT_AMBULATORY_CARE_PROVIDER_SITE_OTHER): Payer: Medicare HMO

## 2023-03-11 DIAGNOSIS — E875 Hyperkalemia: Secondary | ICD-10-CM | POA: Diagnosis not present

## 2023-03-12 DIAGNOSIS — Z961 Presence of intraocular lens: Secondary | ICD-10-CM | POA: Diagnosis not present

## 2023-03-12 DIAGNOSIS — H35371 Puckering of macula, right eye: Secondary | ICD-10-CM | POA: Diagnosis not present

## 2023-03-12 DIAGNOSIS — Z0100 Encounter for examination of eyes and vision without abnormal findings: Secondary | ICD-10-CM | POA: Diagnosis not present

## 2023-03-12 DIAGNOSIS — H353131 Nonexudative age-related macular degeneration, bilateral, early dry stage: Secondary | ICD-10-CM | POA: Diagnosis not present

## 2023-03-12 LAB — BASIC METABOLIC PANEL
BUN: 20 mg/dL (ref 6–23)
CO2: 33 mEq/L — ABNORMAL HIGH (ref 19–32)
Calcium: 9.1 mg/dL (ref 8.4–10.5)
Chloride: 98 mEq/L (ref 96–112)
Creatinine, Ser: 1.62 mg/dL — ABNORMAL HIGH (ref 0.40–1.20)
GFR: 28.64 mL/min — ABNORMAL LOW (ref >60.00)
Glucose, Bld: 101 mg/dL — ABNORMAL HIGH (ref 70–99)
Potassium: 5.3 mEq/L — ABNORMAL HIGH (ref 3.5–5.1)
Sodium: 137 mEq/L (ref 135–145)

## 2023-03-12 NOTE — Telephone Encounter (Cosign Needed)
Called patient's daughter [name]); no answer; left message.  Charlene Brooke, PharmD notified  Marijean Niemann, [place] Clinical Pharmacy Assistant [phone]

## 2023-03-29 DIAGNOSIS — H524 Presbyopia: Secondary | ICD-10-CM | POA: Diagnosis not present

## 2023-04-08 ENCOUNTER — Encounter: Payer: Self-pay | Admitting: Family Medicine

## 2023-04-08 ENCOUNTER — Telehealth (INDEPENDENT_AMBULATORY_CARE_PROVIDER_SITE_OTHER): Payer: Medicare HMO | Admitting: Family Medicine

## 2023-04-08 VITALS — Ht 62.0 in | Wt 103.0 lb

## 2023-04-08 DIAGNOSIS — G8929 Other chronic pain: Secondary | ICD-10-CM | POA: Diagnosis not present

## 2023-04-08 DIAGNOSIS — R1310 Dysphagia, unspecified: Secondary | ICD-10-CM | POA: Diagnosis not present

## 2023-04-08 MED ORDER — HYDROCODONE-ACETAMINOPHEN 7.5-325 MG PO TABS: 1.0000 | ORAL_TABLET | Freq: Three times a day (TID) | ORAL | 0 refills | Status: DC | PRN

## 2023-04-08 NOTE — Progress Notes (Unsigned)
Virtual visit completed through WebEx or similar program Patient location: home  Provider location:  at [facility], office  Participants: Patient and me (unless stated otherwise below)  Limitations and rationale for visit method d/w patient.  Patient agreed to proceed.   CC: follow up  HPI:  dysphagia d/w pt.  She had been putting her pills in applesauce.  Worse gradually.  Hoarse voice.  Not SOB.  Pulse ox check normal at home. Still with dec in appetite at baseline.  She couldn't get ST set up.  Discussed getting swallow study done.  She didn't have sensation of food sticking with swallowing.    Still with chronic pain, still using vicodin at baseline.  Not sedated.    Meds and allergies reviewed.   ROS: Per HPI unless specifically indicated in ROS section   NAD Speech wnl  A/P: Dysphagia, especially with pills.  Will arrange for swallow study at [facility].  At this point still okay for outpatient follow-up.  Routine cautions discussed with patient.  Chronic pain.  Not sedated.  Continue Vicodin as needed.  She agrees with plan.

## 2023-04-10 NOTE — Assessment & Plan Note (Signed)
  Chronic pain.  Not sedated.  Continue Vicodin as needed.  She agrees with plan.

## 2023-04-10 NOTE — Assessment & Plan Note (Signed)
Dysphagia, especially with pills.  Will arrange for swallow study at [facility].  At this point still okay for outpatient follow-up.  Routine cautions discussed with patient.

## 2023-04-12 ENCOUNTER — Other Ambulatory Visit: Payer: Self-pay | Admitting: *Deleted

## 2023-04-12 DIAGNOSIS — I480 Paroxysmal atrial fibrillation: Secondary | ICD-10-CM

## 2023-04-12 MED ORDER — APIXABAN 2.5 MG PO TABS
2.5000 mg | ORAL_TABLET | Freq: Two times a day (BID) | ORAL | 1 refills | Status: DC
Start: 2023-04-12 — End: 2023-11-07

## 2023-04-12 NOTE — Telephone Encounter (Signed)
Prescription refill request for Eliquis received. Indication: afib  Last office visit: Arida, 01/22/2023 Scr: 1.62, 03/11/2023 Age: 89 yo  Weight: 46.7 kg   Refill snet.

## 2023-04-15 ENCOUNTER — Telehealth: Payer: Self-pay | Admitting: Family Medicine

## 2023-04-15 DIAGNOSIS — R1310 Dysphagia, unspecified: Secondary | ICD-10-CM

## 2023-04-15 MED ORDER — LANSOPRAZOLE 15 MG PO CPDR: 15.0000 mg | DELAYED_RELEASE_CAPSULE | Freq: Every day | ORAL | 3 refills | Status: DC

## 2023-04-15 MED ORDER — MIRTAZAPINE 7.5 MG PO TABS: 7.5000 mg | ORAL_TABLET | Freq: Every day | ORAL | 3 refills | Status: DC

## 2023-04-15 MED ORDER — EZETIMIBE 10 MG PO TABS: 10.0000 mg | ORAL_TABLET | Freq: Every day | ORAL | 3 refills | Status: DC

## 2023-04-15 NOTE — Addendum Note (Signed)
Addended by: Wendie Simmer B on: 04/15/2023 04:46 PM   Modules accepted: Orders

## 2023-04-15 NOTE — Telephone Encounter (Signed)
Please update patient that we are checking on this.  I routed this to referrals in the meantime.     ================== Please check to see when the swallow study can be done.  I put in the order previously.  Let me know if the order isn't going to suffice.  Thanks.

## 2023-04-15 NOTE — Telephone Encounter (Signed)
Patient daughter Jillian Oconnell called in and stated that the patient hasn't been scheduled for her swallow test and no referral has been put in. She was following up on this and wanting to know what to do. Please advise. Thank you!

## 2023-04-16 ENCOUNTER — Telehealth: Payer: Self-pay | Admitting: Cardiovascular Disease

## 2023-04-16 MED ORDER — LOSARTAN POTASSIUM 25 MG PO TABS: 12.5000 mg | ORAL_TABLET | Freq: Every day | ORAL | 0 refills | Status: DC

## 2023-04-16 NOTE — Telephone Encounter (Signed)
*  STAT* If patient is at the pharmacy, call can be transferred to refill team.   1. Which medications need to be refilled? (please list name of each medication and dose if known) losartan (COZAAR) 25 MG tablet   2. Which pharmacy/location (including street and city if local pharmacy) is medication to be sent to? [facility] - [place], [place] - [address]   3. Do they need a 30 day or 90 day supply? 90

## 2023-04-16 NOTE — Telephone Encounter (Signed)
Requested Prescriptions   Signed Prescriptions Disp Refills   losartan (COZAAR) 25 MG tablet 45 tablet 0    Sig: Take 0.5 tablets (12.5 mg total) by mouth daily.    Authorizing Provider: Lorine Bears A    Ordering User: Thayer Headings, Arial Galligan L

## 2023-04-17 ENCOUNTER — Encounter: Payer: Self-pay | Admitting: *Deleted

## 2023-04-17 ENCOUNTER — Telehealth: Payer: Self-pay | Admitting: *Deleted

## 2023-04-17 NOTE — Progress Notes (Signed)
  Care Coordination  Outreach Note  04/17/2023 Name: Britaney Espaillat [place] MRN: [id number] DOB: July 03, 1934   Care Coordination Outreach Attempts: An unsuccessful telephone outreach was attempted today to offer the patient information about available care coordination services.  Follow Up Plan:  Additional outreach attempts will be made to offer the patient care coordination information and services.   Encounter Outcome:  No Answer  Burman Nieves, CCMA Care Coordination Care Guide Direct Dial: [phone]

## 2023-04-17 NOTE — Addendum Note (Signed)
Addended by: Joaquim Nam on: 04/17/2023 03:42 PM   Modules accepted: Orders

## 2023-04-17 NOTE — Telephone Encounter (Signed)
I put in the new order for ST.  Please let me know if this will not work.  Thanks.

## 2023-04-24 NOTE — Telephone Encounter (Cosign Needed)
Called patient's daughter, Chip Boer. No answer; left message.  Al Corpus, PharmD notified  Claudina Lick, [place] Clinical Pharmacy Assistant [phone]

## 2023-04-30 ENCOUNTER — Ambulatory Visit
Admission: RE | Admit: 2023-04-30 | Discharge: 2023-04-30 | Disposition: A | Discharge status: Home / Self Care | Payer: Medicare HMO | Source: Ambulatory Visit | Attending: Family Medicine | Admitting: Family Medicine

## 2023-04-30 DIAGNOSIS — R1310 Dysphagia, unspecified: Secondary | ICD-10-CM | POA: Insufficient documentation

## 2023-04-30 NOTE — Progress Notes (Signed)
Modified Barium Swallow Study  Patient Details  Name: Jillian Oconnell MRN: [id number] Date of Birth: 1934/03/12  Today's Date: 04/30/2023  Modified Barium Swallow completed.  Full report located under Chart Review in the Imaging Section.  History of Present Illness Pt is a 89 y.o. 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  She had previous cardiac catheterization in February of 2009 which showed minor luminal irregularities with no evidence of obstructive disease. Ejection fraction was normal.   Pt has been having c/o difficulty swallowing PILLS. She hsa been putting her Pills in applesauce but "they have edges when I cut them that are sharp and they still feel stuck sometimes".  Pt also c/o a slightly Hoarse voice.  Not SOB.  She and Daughter also endorse SINUS DRAINAGE frequently.  Pulse ox check normal at home.  Pt has a h/o decreased appetite at baseline -- though she enjoys eating sweets; she is a "picky" eater per the Dtr.  She does not c/o food getting stuck when swallowing per PCP nor to this SLP.   She uses Vicodin at baseline for pain at home.   Clinical Impression Patient presents with grossly functional oropharyngeal swallowing for age. No aspiration noted during this study.  Oral stage is characterized by adequate lip closure, bolus preparation and containment, and anterior to posterior transit. Swallow initiation occurs at the level of the valleculae for all trials except thin liquids which initiated along the posterior laryngeal surface of the epiglottis. Bolus piecemealing noted x1 w/ trial of solid; pt stated she only takes small bites and chew carefully.   Pharyngeal stage is noted for adequate tongue base retraction, adequate hyolaryngeal excursion, and adequate pharyngeal constriction. Epiglottic deflection is  partial-complete; there was slight penetration of thin liquids (x2/7 trials-1st tsp trial was one episode) into the vestibule which remained above the VCs and was ejected out w/ completion of the swallow. No aspiration occurred during study. There was no valleculae nor pyriform sinus residue post swallows. Pharyngeal stripping wave is complete.  Amplitude/duration of cricopharyngeus opening is WFL. There is adequate/complete clearance through the cervical esophagus. However, noted was both an anterior/posterior prominence of the CP segment-- which may be related to pt's c/o Pills "getting stuck". Again, full clearance of food/liquid was noted w/ no retrograde activity nor stasis.  Pt did NOT report a globus sensation w/ the trials given today.  Consistencies tested were thin liquids x2 tsps, 2 cup sip, 3 sequential sips, nectar x1 tsp, 1 cup sip, 2 sequential cup sips, honey x1 tsp, pudding x1 tsp, regular solid (1/2 graham cracker with pudding). 13 mm barium tablet not tested.  Recommend patient continue a fairly regular diet with thin liquids via Cup; educated pt and Dtr verbally including moistening dry foods, alternating solids and liquids, swallowing twice to clear, PILLS in either liquid, powder, or chewable forms moving foward. No further ST indicated. F/u w/ ENT/GI for further management as needed. Factors that may increase risk of adverse event in presence of aspiration Jillian Oconnell 2021):  (n/a)    Swallow Evaluation Recommendations Recommendations: PO diet PO Diet Recommendation: Regular;Thin liquids (Level 0) (soft, cooked foods) Liquid Administration via: Cup Medication Administration: Crushed with puree (or in liquid or chewable forms) Supervision: Patient able to self-feed Swallowing strategies  : Minimize environmental distractions;Slow rate;Small bites/sips;Follow solids with liquids Postural changes: Position pt fully upright  for meals;Stay upright 30-60 min after meals  (Reflux precs) Oral care recommendations: Oral care BID (2x/day);Pt independent with oral care Recommended consults: Consider ENT consultation;Consider GI consultation;Consider dietitian consultation       Jerilynn Som, MS, CCC-SLP Speech Language Pathologist Rehab Services; [facility] [phone] (ascom) Whitman Meinhardt 04/30/2023,6:35 PM

## 2023-05-02 ENCOUNTER — Other Ambulatory Visit: Payer: Self-pay | Admitting: Family Medicine

## 2023-05-02 ENCOUNTER — Encounter: Payer: Self-pay | Admitting: Family Medicine

## 2023-05-02 MED ORDER — LOSARTAN POTASSIUM 25 MG PO TABS: 25.0000 mg | ORAL_TABLET | Freq: Every day | ORAL | 3 refills | Status: DC

## 2023-05-02 MED ORDER — LOSARTAN POTASSIUM 25 MG PO TABS: 12.5000 mg | ORAL_TABLET | Freq: Every day | ORAL | 3 refills | Status: DC

## 2023-05-03 NOTE — Progress Notes (Signed)
  Care Coordination   Note   05/03/2023 Name: Jillian Oconnell MRN: [id number] DOB: 1934-01-02  Jillian Oconnell is a 89 y.o. year old female who sees Joaquim Nam, MD for primary care. I reached out to Jillian Oconnell by phone today to offer care coordination services.  Jillian Oconnell was given information about Care Coordination services today including:   The Care Coordination services include support from the care team which includes your Nurse Coordinator, Clinical Social Worker, or Pharmacist.  The Care Coordination team is here to help remove barriers to the health concerns and goals most important to you. Care Coordination services are voluntary, and the patient may decline or stop services at any time by request to their care team member.   Care Coordination Consent Status: Patient agreed to services and verbal consent obtained.   Follow up plan:  Telephone appointment with care coordination team member scheduled for:  05/28/2023 per request pt will be out of town next few weeks  Encounter Outcome:  Pt. Scheduled  Burman Nieves, CCMA Care Coordination Care Guide Direct Dial: [phone]

## 2023-05-28 ENCOUNTER — Ambulatory Visit: Payer: Self-pay

## 2023-05-28 NOTE — Patient Outreach (Signed)
  Care Coordination   Initial Visit Note   05/28/2023 Name: Jillian Oconnell MRN: [id number] DOB: [birth year]  Jillian Oconnell is a 89 y.o. year old female who sees Jillian Nam, MD for primary care. I spoke with  Jillian Oconnell and daughter Jillian Oconnell by phone today.  What matters to the patients health and wellness today?  Daughter states patient several months ago had issues with her blood pressure being to low.  She states patients PCP decreased her BP medicine to 1/2 tablet at that time. She states patients blood pressure started to go back up and now patient is currently on 1 tab of losartan.  Daughter states patient is not currently monitoring her blood pressures. Daughter states patient needs bathing assistance. Patient and daughter agreeable to referral to Child psychotherapist.    Goals Addressed             This Visit's Progress    management of health conditions and community resource needs.       Interventions Today    Flowsheet Row Most Recent Value  Chronic Disease   Chronic disease during today's visit Hypertension (HTN)  General Interventions   General Interventions Discussed/Reviewed General Interventions Discussed, Doctor Visits, [employer] of current treatment plan for HTN and patients adherence to plan as established by provider. Assessed for BP readings/ symptoms of lightheadedness / dizziness. Referred to social worker for bathing assistance.]  Doctor Visits Discussed/Reviewed Doctor Visits Discussed  [reviewed upcoming/ scheduled appointments.]  Education Interventions   Education Provided Provided Chiropractor management]  Pharmacy Interventions   Pharmacy Dicussed/Reviewed Pharmacy Topics Discussed  [medication review and compliance discussed.]              SDOH assessments and interventions completed:  Yes  SDOH Interventions Today    Flowsheet Row Most Recent Value  SDOH Interventions   Food Insecurity  Interventions Intervention Not Indicated  Housing Interventions Intervention Not Indicated  Transportation Interventions Intervention Not Indicated        Care Coordination Interventions:  Yes, provided   Follow up plan: Follow up call scheduled for 06/24/23    Encounter Outcome:  Pt. Visit Completed   Jillian Ina RN,BSN,CCM RN Case Manager [place] [phone]

## 2023-05-28 NOTE — Patient Instructions (Addendum)
Visit Information  Thank you for taking time to visit with me today. Please don't hesitate to contact me if I can be of assistance to you.   Following are the goals we discussed today:  Take blood pressure at least 1-2 times per week and record. Schedule follow up appointment with provider You will be referred to social worker to discuss bath assistance.  Take medications as prescribed. Follow up with provider as recommended. Notify your provider for new or ongoing symptoms.    Our next appointment is by telephone on 06/24/23 at 2:30 pm  Please call the care guide team at [phone] if you need to cancel or reschedule your appointment.   If you are experiencing a Mental Health or Behavioral Health Crisis or need someone to talk to, please call the Suicide and Crisis Lifeline: 988 call 1-800-273-TALK (toll free, 24 hour hotline)  Patient verbalizes understanding of instructions and care plan provided today and agrees to view in MyChart. Active MyChart status and patient understanding of how to access instructions and care plan via MyChart confirmed with patient.     George Ina RN,BSN,CCM [facility] Care Coordination [phone] direct line  Managing Your Hypertension Hypertension, also called high blood pressure, is when the force of the blood pressing against the walls of the arteries is too strong. Arteries are blood vessels that carry blood from your heart throughout your body. Hypertension forces the heart to work harder to pump blood and may cause the arteries to become narrow or stiff. Understanding blood pressure readings A blood pressure reading includes a higher number over a lower number: The first, or top, number is called the systolic pressure. It is a measure of the pressure in your arteries as your heart beats. The second, or bottom number, is called the diastolic pressure. It is a measure of the pressure in your arteries as the heart relaxes. For most people, a normal blood  pressure is below 120/80. Your personal target blood pressure may vary depending on your medical conditions, your age, and other factors. Blood pressure is classified into four stages. Based on your blood pressure reading, your health care provider may use the following stages to determine what type of treatment you need, if any. Systolic pressure and diastolic pressure are measured in a unit called millimeters of mercury (mmHg). Normal Systolic pressure: below 120. Diastolic pressure: below 80. Elevated Systolic pressure: 120-129. Diastolic pressure: below 80. Hypertension stage 1 Systolic pressure: 130-139. Diastolic pressure: 80-89. Hypertension stage 2 Systolic pressure: 140 or above. Diastolic pressure: 90 or above. How can this condition affect me? Managing your hypertension is very important. Over time, hypertension can damage the arteries and decrease blood flow to parts of the body, including the brain, heart, and kidneys. Having untreated or uncontrolled hypertension can lead to: A heart attack. A stroke. A weakened blood vessel (aneurysm). Heart failure. Kidney damage. Eye damage. Memory and concentration problems. Vascular dementia. What actions can I take to manage this condition? Hypertension can be managed by making lifestyle changes and possibly by taking medicines. Your health care provider will help you make a plan to bring your blood pressure within a normal range. You may be referred for counseling on a healthy diet and physical activity. Nutrition  Eat a diet that is high in fiber and potassium, and low in salt (sodium), added sugar, and fat. An example eating plan is called the DASH diet. DASH stands for Dietary Approaches to Stop Hypertension. To eat this way: Eat plenty of  fresh fruits and vegetables. Try to fill one-half of your plate at each meal with fruits and vegetables. Eat whole grains, such as whole-wheat pasta, brown rice, or whole-grain bread. Fill  about one-fourth of your plate with whole grains. Eat low-fat dairy products. Avoid fatty cuts of meat, processed or cured meats, and poultry with skin. Fill about one-fourth of your plate with lean proteins such as fish, chicken without skin, beans, eggs, and tofu. Avoid pre-made and processed foods. These tend to be higher in sodium, added sugar, and fat. Reduce your daily sodium intake. Many people with hypertension should eat less than 1,500 mg of sodium a day. Lifestyle  Work with your health care provider to maintain a healthy body weight or to lose weight. Ask what an ideal weight is for you. Get at least 30 minutes of exercise that causes your heart to beat faster (aerobic exercise) most days of the week. Activities may include walking, swimming, or biking. Include exercise to strengthen your muscles (resistance exercise), such as weight lifting, as part of your weekly exercise routine. Try to do these types of exercises for 30 minutes at least 3 days a week. Do not use any products that contain nicotine or tobacco. These products include cigarettes, chewing tobacco, and vaping devices, such as e-cigarettes. If you need help quitting, ask your health care provider. Control any long-term (chronic) conditions you have, such as high cholesterol or diabetes. Identify your sources of stress and find ways to manage stress. This may include meditation, deep breathing, or making time for fun activities. Alcohol use Do not drink alcohol if: Your health care provider tells you not to drink. You are pregnant, may be pregnant, or are planning to become pregnant. If you drink alcohol: Limit how much you have to: 0-1 drink a day for women. 0-2 drinks a day for men. Know how much alcohol is in your drink. In the [place] one drink equals one 12 oz bottle of beer (355 mL), one 5 oz glass of wine (148 mL), or one 1 oz glass of hard liquor (44 mL). Medicines Your health care provider may prescribe  medicine if lifestyle changes are not enough to get your blood pressure under control and if: Your systolic blood pressure is 130 or higher. Your diastolic blood pressure is 80 or higher. Take medicines only as told by your health care provider. Follow the directions carefully. Blood pressure medicines must be taken as told by your health care provider. The medicine does not work as well when you skip doses. Skipping doses also puts you at risk for problems. Monitoring Before you monitor your blood pressure: Do not smoke, drink caffeinated beverages, or exercise within 30 minutes before taking a measurement. Use the bathroom and empty your bladder (urinate). Sit quietly for at least 5 minutes before taking measurements. Monitor your blood pressure at home as told by your health care provider. To do this: Sit with your back straight and supported. Place your feet flat on the floor. Do not cross your legs. Support your arm on a flat surface, such as a table. Make sure your upper arm is at heart level. Each time you measure, take two or three readings one minute apart and record the results. You may also need to have your blood pressure checked regularly by your health care provider. General information Talk with your health care provider about your diet, exercise habits, and other lifestyle factors that may be contributing to hypertension. Review all the medicines you  take with your health care provider because there may be side effects or interactions. Keep all follow-up visits. Your health care provider can help you create and adjust your plan for managing your high blood pressure. Where to find more information National Heart, Lung, and Blood Institute: [employer].is American Heart Association: www.heart.org Contact a health care provider if: You think you are having a reaction to medicines you have taken. You have repeated (recurrent) headaches. You feel dizzy. You have swelling in  your ankles. You have trouble with your vision. Get help right away if: You develop a severe headache or confusion. You have unusual weakness or numbness, or you feel faint. You have severe pain in your chest or abdomen. You vomit repeatedly. You have trouble breathing. These symptoms may be an emergency. Get help right away. Call 911. Do not wait to see if the symptoms will go away. Do not drive yourself to the hospital. Summary Hypertension is when the force of blood pumping through your arteries is too strong. If this condition is not controlled, it may put you at risk for serious complications. Your personal target blood pressure may vary depending on your medical conditions, your age, and other factors. For most people, a normal blood pressure is less than 120/80. Hypertension is managed by lifestyle changes, medicines, or both. Lifestyle changes to help manage hypertension include losing weight, eating a healthy, low-sodium diet, exercising more, stopping smoking, and limiting alcohol. This information is not intended to replace advice given to you by your health care provider. Make sure you discuss any questions you have with your health care provider. Document Revised: 08/17/2021 Document Reviewed: 08/17/2021 Elsevier Patient Education  2024 [employer].

## 2023-06-07 ENCOUNTER — Telehealth: Payer: Self-pay | Admitting: Family Medicine

## 2023-06-07 ENCOUNTER — Encounter: Payer: Self-pay | Admitting: Primary Care

## 2023-06-07 ENCOUNTER — Ambulatory Visit (INDEPENDENT_AMBULATORY_CARE_PROVIDER_SITE_OTHER): Payer: Medicare HMO | Admitting: Primary Care

## 2023-06-07 VITALS — BP 130/86 | HR 66 | Temp 97.8°F | Ht 62.0 in | Wt 107.0 lb

## 2023-06-07 DIAGNOSIS — S81811A Laceration without foreign body, right lower leg, initial encounter: Secondary | ICD-10-CM

## 2023-06-07 DIAGNOSIS — W5503XA Scratched by cat, initial encounter: Secondary | ICD-10-CM | POA: Diagnosis not present

## 2023-06-07 DIAGNOSIS — Z23 Encounter for immunization: Secondary | ICD-10-CM | POA: Diagnosis not present

## 2023-06-07 MED ORDER — AMOXICILLIN 875 MG PO TABS
875.0000 mg | ORAL_TABLET | Freq: Two times a day (BID) | ORAL | 0 refills | Status: AC
Start: 2023-06-07 — End: 2023-06-12

## 2023-06-07 NOTE — Telephone Encounter (Signed)
Patient called back in, scheduled for appt with Mayra Reel 06/07/23 @ 2:20

## 2023-06-07 NOTE — Addendum Note (Signed)
Addended by: Eual Fines on: 06/07/2023 03:06 PM   Modules accepted: Orders

## 2023-06-07 NOTE — Telephone Encounter (Signed)
Patient was scratched pretty bad by a neighbors cat last night. Daughter called in stating that she is on blood thinners,and she did bleed quite a bit but the bleeding has now stopped. She has a bandage on it now.Her daughter would like to know if her tetanus vaccine is up to date,and if Dr Para March thinks she may need to be on an antibiotic?

## 2023-06-07 NOTE — Assessment & Plan Note (Addendum)
Wound site is not idea for suturing and her time frame for sutures has nearly lapsed.  Given the wound appearance, coupled with her age, will provide treatment prophylaxis.  She has an allergy to amoxicillin-clavulanate, but can tolerate amoxicillin. Start Amoxil 875 twice daily x 5 days.  New dressing applied today. Tetanus shot updated. Keep wound clean and dry.  Close follow-up with PCP next week.

## 2023-06-07 NOTE — Progress Notes (Signed)
Subjective:    Patient ID: Jillian Oconnell, female    DOB: [birth year], 89 y.o.   MRN: [id number]  HPI  Jillian Oconnell is a very pleasant 89 y.o. female patient of Dr. Para March with a history of hypertension, paroxysmal SVT, paroxysmal atrial fibrillation, pulmonary hypertension, hypothyroidism, who presents today to discuss cat scratch.   Last night a neighborhood cat accidentally scratched her right lower extremity on the right lateral calf. Last night the wound was bleeding and painful. Today she's feeling better and the bleeding has subsided.   She is managed on Eliquis 2.5 mg BID. She has applied dressings to the site of her wound.   She denies fevers.    Review of Systems  Constitutional:  Negative for fever.  Skin:  Positive for color change and wound.         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    Stroke [facility])     Social History   Socioeconomic History   Marital status: Widowed    Spouse name: Not on file   Number of children: Not on file   Years of education: Not on file   Highest education level: Not on file  Occupational History   Occupation: Retired    Associate Professor: retired    Comment: Homemaker  Tobacco Use   Smoking status: Never   Smokeless tobacco: Never  Building services engineer Use: Never used  Substance and Sexual Activity   Alcohol use: No    Alcohol/week: 0.0 standard drinks of  alcohol   Drug use: No   Sexual activity: Not Currently    Birth control/protection: Post-menopausal  Other Topics Concern   Not on file  Social History Narrative   Regular exercise- no    Moved here from [place]- to be closer to family    Husband died of lung CA 2013/07/17   Social Determinants of Health   Financial Resource Strain: Low Risk  (12/14/2022)   Overall Financial Resource Strain (CARDIA)    Difficulty of Paying Living Expenses: Not hard at all  Food Insecurity: No Food Insecurity (05/28/2023)   Hunger Vital Sign    Worried About Running Out of Food in the Last Year: Never true    Ran Out of Food in the Last Year: Never true  Transportation Needs: No Transportation Needs (05/28/2023)   PRAPARE - Administrator, Civil Service (Medical): No    Lack of Transportation (Non-Medical): No  Physical Activity: Inactive (12/04/2021)   Exercise Vital Sign    Days of Exercise per Week: 0 days    Minutes of Exercise per Session: 0 min  Stress: No Stress Concern Present (12/14/2022)   [employer] of Occupational Health -  Occupational Stress Questionnaire    Feeling of Stress : Not at all  Social Connections: Socially Isolated (12/14/2022)   Social Connection and Isolation Panel [NHANES]    Frequency of Communication with Friends and Family: More than three times a week    Frequency of Social Gatherings with Friends and Family: More than three times a week    Attends Religious Services: Never    Database administrator or Organizations: No    Attends Banker Meetings: Never    Marital Status: Widowed  Intimate Partner Violence: Not At Risk (12/14/2022)   Humiliation, Afraid, Rape, and Kick questionnaire    Fear of Current or Ex-Partner: No    Emotionally Abused: No    Physically Abused: No    Sexually Abused: No    Past Surgical History:  Procedure Laterality Date   BREAST EXCISIONAL BIOPSY Right 1988   NEG   CARDIAC CATHETERIZATION  01/2008   65%  ;[facility]. Minor luminal irregularities with no evidence of obstructive disease.   cataract surgery     GALLBLADDER SURGERY  1997   RECTOCELE REPAIR  2004   TOTAL ABDOMINAL HYSTERECTOMY      Family History  Problem Relation Age of Onset   Hypertension Mother    Breast cancer Mother 46   Hypertension Father    Aneurysm Father        AAA   Cancer Sister        colon   Colon cancer Sister    Cancer Daughter        thyroid   Anxiety disorder Daughter    Breast cancer Daughter 39   Breast cancer Daughter     Allergies  Allergen Reactions   Albuterol     REACTION: jittery   Alendronate Sodium     REACTION: GI side eff   Amoxicillin-Pot Clavulanate     REACTION: non tolerant- but can take plain amox   Atorvastatin Nausea And Vomiting   Azithromycin     GI upset.  Not an allergy.   Ezetimibe-Simvastatin     REACTION: myalgia   Gabapentin     REACTION: tremor   Ibandronate Sodium     REACTION: GI side eff   Lexapro [Escitalopram Oxalate]     sedation   Lyrica [Pregabalin] Other (See Comments)    heart racing and didn't help pain.    Neosporin [Bacitracin-Polymyxin B]     Irritation locally   Risedronate Sodium     REACTION: GI side eff   Rosuvastatin     REACTION: myalgia   Sertraline     sedation   Sulfonamide Derivatives     REACTION: not tolerate    Current Outpatient Medications on File Prior to Visit  Medication Sig Dispense Refill   albuterol (VENTOLIN HFA) 108 (90 Base) MCG/ACT inhaler Inhale 1-2 puffs into the lungs every 6 (six) hours as needed for wheezing or shortness of breath. 1 each 0   amiodarone (PACERONE) 100 MG tablet Take 1 tablet (100 mg total) by mouth daily. 90 tablet 1   apixaban (ELIQUIS) 2.5 MG TABS tablet Take 1 tablet (2.5 mg total) by mouth 2 (two) times daily. 180 tablet 1   Calcium Carbonate-Vitamin D 600-400 MG-UNIT tablet Take 1 tablet by mouth 2 (two) times daily. Take 1 tablet by mouth two times a day     cyanocobalamin 500 MCG TABS  Take 500 mcg by mouth daily.     ezetimibe (ZETIA) 10 MG tablet Take 1 tablet (  10 mg total) by mouth daily. 90 tablet 3   fluticasone (FLONASE) 50 MCG/ACT nasal spray Place 2 sprays into both nostrils daily. 16 g 12   Fluticasone Propionate, Inhal, (FLOVENT DISKUS) 250 MCG/BLIST AEPB INHALE 2 PUFFS IN THE MORNING AND INHALE 2 PUFFS IN THE EVENING (RINSE AFTER USE) 60 each 12   HYDROcodone-acetaminophen (NORCO) 7.5-325 MG tablet Take 1 tablet by mouth 3 (three) times daily as needed (for pain.). Fill on/after 60 days from rx 90 tablet 0   HYDROcodone-acetaminophen (NORCO) 7.5-325 MG tablet Take 1 tablet by mouth 3 (three) times daily as needed (for pain.). Fill on/after 30 days from rx 90 tablet 0   HYDROcodone-acetaminophen (NORCO) 7.5-325 MG tablet Take 1 tablet by mouth 3 (three) times daily as needed (for pain). 90 tablet 0   lansoprazole (PREVACID) 15 MG capsule Take 1 capsule (15 mg total) by mouth daily. 90 capsule 3   levothyroxine (SYNTHROID) 25 MCG tablet Take 1 tablet by mouth once daily 90 tablet 1   LORazepam (ATIVAN) 1 MG tablet TAKE 1/2 (ONE-HALF) TABLET BY MOUTH TWICE DAILY AS NEEDED (SEDATION  CAUTION) (Patient taking differently: TAKE 1/2 (ONE-HALF) TABLET BY MOUTH TWICE DAILY AS NEEDED (SEDATION  CAUTION)) 30 tablet 0   losartan (COZAAR) 25 MG tablet Take 1 tablet (25 mg total) by mouth daily. To replacement prev rx 90 tablet 3   metoprolol succinate (TOPROL-XL) 100 MG 24 hr tablet TAKE 1 TABLET TWICE DAILY WITH OR IMMEDIATELY FOLLOWING A MEAL 180 tablet 3   mirtazapine (REMERON) 7.5 MG tablet Take 1 tablet (7.5 mg total) by mouth at bedtime. 90 tablet 3   montelukast (SINGULAIR) 10 MG tablet TAKE 1 TABLET AT BEDTIME 90 tablet 3   mupirocin ointment (BACTROBAN) 2 % Apply topically 2 (two) times daily. 15 g 0   ondansetron (ZOFRAN) 4 MG tablet Take 1 tablet (4 mg total) by mouth every 8 (eight) hours as needed for nausea or vomiting. 20 tablet 0   No current facility-administered  medications on file prior to visit.    BP 130/86 (BP Location: Left Arm, Patient Position: Sitting, Cuff Size: Normal)   Pulse 66   Temp 97.8 F (36.6 C)   Ht 5\' 2"  (1.575 m)   Wt 107 lb (48.5 kg)   SpO2 98%   BMI 19.57 kg/m  Objective:   Physical Exam Constitutional:      General: She is not in acute distress. Pulmonary:     Effort: Pulmonary effort is normal.  Skin:    General: Skin is warm.     Comments: Two linear lacerations to right lateral calf with adipose tissue exposed. Bleeding controlled. No drainage.              Assessment & Plan:  Cat scratch Assessment & Plan: Wound site is not idea for suturing and her time frame for sutures has nearly lapsed.  Given the wound appearance, coupled with her age, will provide treatment prophylaxis.  She has an allergy to amoxicillin-clavulanate, but can tolerate amoxicillin. Start Amoxil 875 twice daily x 5 days.  New dressing applied today. Tetanus shot updated. Keep wound clean and dry.  Close follow-up with PCP next week.  Orders: -     Amoxicillin; Take 1 tablet (875 mg total) by mouth 2 (two) times daily for 5 days.  Dispense: 10 tablet; Refill: 0        Doreene Nest, NP

## 2023-06-07 NOTE — Telephone Encounter (Signed)
Left voicemail with patients daughter advising patient needs appointment for evaluation.

## 2023-06-07 NOTE — Patient Instructions (Signed)
Start amoxicillin 875 mg antibiotics.  Take 1 tablet by mouth twice daily for 5 days.  Please schedule an appoint with Dr. Para March for next week.  It was a pleasure meeting you!

## 2023-06-13 ENCOUNTER — Ambulatory Visit (INDEPENDENT_AMBULATORY_CARE_PROVIDER_SITE_OTHER): Payer: Medicare HMO | Admitting: Family Medicine

## 2023-06-13 VITALS — BP 118/76 | HR 66 | Temp 97.7°F | Ht 62.0 in | Wt 106.0 lb

## 2023-06-13 DIAGNOSIS — S80811A Abrasion, right lower leg, initial encounter: Secondary | ICD-10-CM

## 2023-06-13 DIAGNOSIS — W5503XA Scratched by cat, initial encounter: Secondary | ICD-10-CM | POA: Diagnosis not present

## 2023-06-13 NOTE — Progress Notes (Signed)
R leg cat scratch, not a bite.  Neighbor's cat, unclear vaccination history.  Cat is still acting normally.  She was able to finish amoxil rx.  Done with abx now.  No fevers.    Will need hydrocodone rxs x3 for next month at [facility].  Taking pain medication at baseline without sedation.  Meds, vitals, and allergies reviewed.   ROS: Per HPI unless specifically indicated in ROS section   Nad Ncat Neck supple, no LA Rrr R shin with 2 resolving superficial injuries. 5x0.5cm linear 1x1 V shaped scratch.   No spreading erythema.  No purulent discharge.  No fluctuant mass.  No bleeding.  Wounds appear clean.  Recovered with nonstick bandage and Coban.

## 2023-06-13 NOTE — Patient Instructions (Signed)
Keep using mupirocin and a nonstick bandage daily.  Update me as needed.  Take care.  Glad to see you.

## 2023-06-16 MED ORDER — HYDROCODONE-ACETAMINOPHEN 7.5-325 MG PO TABS: 1.0000 | ORAL_TABLET | Freq: Three times a day (TID) | ORAL | 0 refills | Status: DC | PRN

## 2023-06-16 NOTE — Assessment & Plan Note (Signed)
Continue with Bactroban and nonstick bandage.  Change daily.  Wash with soap and water then rinse.  Do not scrub the wound.  Update me as needed.  Should resolve.  She agrees.  Family is able to monitor the cat next door.  Routine cautions given.

## 2023-06-24 ENCOUNTER — Ambulatory Visit: Payer: Self-pay

## 2023-06-24 DIAGNOSIS — Z139 Encounter for screening, unspecified: Secondary | ICD-10-CM

## 2023-06-28 NOTE — Addendum Note (Signed)
Addended by: George Ina E on: 06/28/2023 06:40 PM   Modules accepted: Orders

## 2023-06-28 NOTE — Patient Outreach (Signed)
  Care Coordination   Follow Up Visit Note   06/28/2023 Late enry 06/24/23 Name: Jillian Oconnell MRN: [id number] DOB: 27-Sep-1934  Jillian Oconnell is a 89 y.o. year old female who sees Joaquim Nam, MD for primary care. I spoke with  Jillian Oconnell by phone today.  What matters to the patients health and wellness today?  Patient states doing well.  Reports cat scratch healing well and completed antibiotic.  Patient reports blood pressure 118/76.     Goals Addressed             This Visit's Progress    management of health conditions and community resource needs.       Interventions Today    Flowsheet Row Most Recent Value  Chronic Disease   Chronic disease during today's visit Hypertension (HTN)  General Interventions   General Interventions Discussed/Reviewed General Interventions Reviewed, Doctor Visits, [employer] of current treatment  for HTN and patients adherence to plan as established by provider.  Assessed BP readings.  Assessed for signs/ symptms of infection at cat bite site. Refer to Child psychotherapist for community resources.]  Doctor Visits Discussed/Reviewed Doctor Visits Reviewed  Annabell Sabal scheduled/ upcoming provider visits.]  Education Interventions   Education Provided Provided Education  [Advised to continue to monitor blood pressures and recording. Reviewed signs/ symptoms of infection]  Pharmacy Interventions   Pharmacy Dicussed/Reviewed Pharmacy Topics Reviewed  [medication reviewed and compliance discussed.]              SDOH assessments and interventions completed:  No     Care Coordination Interventions:  Yes, provided   Follow up plan: Follow up call scheduled for 07/18/23    Encounter Outcome:  Pt. Visit Completed   George Ina RN,BSN,CCM [facility] Care Coordination [phone] direct line

## 2023-07-01 ENCOUNTER — Telehealth: Payer: Self-pay

## 2023-07-01 ENCOUNTER — Encounter: Payer: Self-pay | Admitting: Family Medicine

## 2023-07-01 NOTE — Telephone Encounter (Signed)
   Telephone encounter was:  Unsuccessful.  07/01/2023 Name: Aleisa Howk Rogel MRN: [id number] DOB: 1934-03-23  Unsuccessful outbound call made today to assist with:   In home care  Outreach Attempt:  1st Attempt  A HIPAA compliant voice message was left requesting a return call.  Instructed patient to call back     Lenard Forth [facility] Guide, [facility] [phone] [address] [place], [place], [place] [address] Phone: [phone] Email: [email or web address]

## 2023-07-02 ENCOUNTER — Ambulatory Visit: Payer: Medicare HMO | Admitting: Family Medicine

## 2023-07-02 ENCOUNTER — Telehealth: Payer: Self-pay

## 2023-07-02 ENCOUNTER — Encounter: Payer: Self-pay | Admitting: Family Medicine

## 2023-07-02 VITALS — BP 102/70 | HR 72 | Temp 98.8°F | Ht 62.0 in | Wt 104.0 lb

## 2023-07-02 DIAGNOSIS — W5503XA Scratched by cat, initial encounter: Secondary | ICD-10-CM

## 2023-07-02 DIAGNOSIS — E039 Hypothyroidism, unspecified: Secondary | ICD-10-CM | POA: Diagnosis not present

## 2023-07-02 MED ORDER — LEVOTHYROXINE SODIUM 25 MCG PO TABS
25.0000 ug | ORAL_TABLET | Freq: Every day | ORAL | 1 refills | Status: DC
Start: 2023-07-02 — End: 2024-06-16

## 2023-07-02 MED ORDER — MUPIROCIN 2 % EX OINT: TOPICAL_OINTMENT | Freq: Two times a day (BID) | CUTANEOUS | 0 refills | Status: DC

## 2023-07-02 NOTE — Progress Notes (Unsigned)
Follow up on cat scratch.  Cat is update to date on vaccines per report.  Minimal drainage from the site on the R shin.  No fevers.   D/w pt about options re: protein intake.  She likes peanut butter.  Discussed adding protein source (i.e. protein powder) to a milkshake.  Meds, vitals, and allergies reviewed.   ROS: Per HPI unless specifically indicated in ROS section   Nad ncat 5x2 and 3x2cm scab on the R shin.  Locally pink but not red or hot.  Normal cap refill.  No discharge.   It looks like she has some swelling above the lesion on the proximal portion of the leg.  It looks like she had the area wrapped with Coban and this was causing local compression with an hourglass type indentation.

## 2023-07-02 NOTE — Telephone Encounter (Signed)
   Telephone encounter was:  Successful.  07/02/2023 Name: Jillian Oconnell MRN: [id number] DOB: 1934/05/04  Jillian Oconnell is a 89 y.o. year old female who is a primary care patient of Joaquim Nam, MD . The community resource team was consulted for assistance with  In home Care   Care guide performed the following interventions: Patient provided with information about care guide support team and interviewed to confirm resource needs.Patient's daughter has questions about getting in home care for her mother. I was able to giver her some guidance over the phone of how and where to start with resources Insurance and PCP order for insurance to cover. Pt has no other needs at this time   Follow Up Plan:  No further follow up planned at this time. The patient has been provided with needed resources.    Lenard Forth [facility] Guide, [facility] [phone] [address] [place], [place], [place] [address] Phone: [phone] Email: [email or web address]

## 2023-07-02 NOTE — Patient Instructions (Addendum)
Use a small amount of bactroban with a nonstick bandage with an ACE wrap instead of coban.  Don't wrap it tightly.  Try to increase protein intake.  Update [place] as needed.  Take care.  Glad to see you.

## 2023-07-03 ENCOUNTER — Ambulatory Visit: Payer: Self-pay

## 2023-07-03 NOTE — Patient Outreach (Signed)
  Care Coordination   Initial Visit Note   07/03/2023 Name: Hannan Tetzlaff Varnadore MRN: [id number] DOB: 1934/02/06  Lemont Fillers [place]way is a 89 y.o. year old female who sees Joaquim Nam, MD for primary care. I spoke with  Lemont Fillers Oliva by phone today.  What matters to the patients health and wellness today?  Patients daughter wants additional support to care for patient.    Goals Addressed             This Visit's Progress    Personal care       Care Coordination Interventions: Patients daughter and granddaughter provide person care and would like additional assistance with bathing and grooming.  Patient can not afford to self-pay.  [employer] is contacted and patients insurance does not cover personal care unless it is to support PT/OT and can only provide light care, not bathing, cleaning or cooking.  Patients daughter agreed to contact [employer] to consider alternative insurance plan that covers personal care, along with patients current medical providers and medication.         SDOH assessments and interventions completed:  Yes  SDOH Interventions Today    Flowsheet Row Most Recent Value  SDOH Interventions   Food Insecurity Interventions Intervention Not Indicated  Housing Interventions Intervention Not Indicated  Utilities Interventions Intervention Not Indicated        Care Coordination Interventions:  Yes, provided   Interventions Today    Flowsheet Row Most Recent Value  General Interventions   General Interventions Discussed/Reviewed General Interventions Discussed  [T/c [employer] and personal care is not covered.  Patient will contact [employer] for other plans that cover PC.]        Follow up plan: No further intervention required.   Encounter Outcome:  Pt. Visit Completed

## 2023-07-03 NOTE — Telephone Encounter (Signed)
See office visit note

## 2023-07-03 NOTE — Assessment & Plan Note (Signed)
I think it makes sense to keep using Bactroban and a nonstick bandage.  We wrapped the area with an Ace wrap that was not too tight.  I think this should still gradually heal over and it does not make sense to use a hydrocolloid dressing since she does not have any ulceration.  Discussed increasing her protein intake to help with healing.  She can update [place] as needed.

## 2023-07-03 NOTE — Patient Instructions (Signed)
Visit Information  Thank you for taking time to visit with me today. Please don't hesitate to contact me if I can be of assistance to you.   Following are the goals we discussed today:   Goals Addressed             This Visit's Progress    Personal care       Care Coordination Interventions: Patients daughter and granddaughter provide person care and would like additional assistance with bathing and grooming.  Patient can not afford to self-pay.  [employer] is contacted and patients insurance does not cover personal care unless it is to support PT/OT and can only provide light care, not bathing, cleaning or cooking.  Patients daughter agreed to contact [employer] to consider alternative insurance plan that covers personal care, along with patients current medical providers and medication.          If you are experiencing a Mental Health or Behavioral Health Crisis or need someone to talk to, please call 911  Patient verbalizes understanding of instructions and care plan provided today and agrees to view in MyChart. Active MyChart status and patient understanding of how to access instructions and care plan via MyChart confirmed with patient.     No further follow up required:    Lysle Morales, BSW Social Worker [facility] Care Management  [phone]

## 2023-07-18 ENCOUNTER — Ambulatory Visit: Payer: Self-pay

## 2023-07-18 NOTE — Patient Outreach (Signed)
  Care Coordination   07/18/2023 Name: Jillian Oconnell MRN: [id number] DOB: Nov 04, 1934   Care Coordination Outreach Attempts:  Contact states patient is out of town visiting family. Request return call to patient at a later date.  HIPAA compliant message left.   Follow Up Plan:  Additional outreach attempts will be made to offer the patient care coordination information and services.   Encounter Outcome:  No Answer   Care Coordination Interventions:  No, not indicated    George Ina [facility] Care Coordination [phone] direct line

## 2023-07-29 ENCOUNTER — Ambulatory Visit (INDEPENDENT_AMBULATORY_CARE_PROVIDER_SITE_OTHER): Payer: Medicare HMO | Admitting: Family Medicine

## 2023-07-29 ENCOUNTER — Other Ambulatory Visit: Payer: Self-pay | Admitting: Family Medicine

## 2023-07-29 ENCOUNTER — Encounter: Payer: Self-pay | Admitting: Family Medicine

## 2023-07-29 VITALS — BP 112/64 | HR 68 | Temp 97.9°F | Ht 62.0 in | Wt 106.0 lb

## 2023-07-29 DIAGNOSIS — W5503XA Scratched by cat, initial encounter: Secondary | ICD-10-CM

## 2023-07-29 DIAGNOSIS — R0602 Shortness of breath: Secondary | ICD-10-CM

## 2023-07-29 MED ORDER — FLOVENT DISKUS 250 MCG/ACT IN AEPB: 1.0000 | INHALATION_SPRAY | Freq: Two times a day (BID) | RESPIRATORY_TRACT | 3 refills | Status: DC

## 2023-07-29 MED ORDER — MUPIROCIN 2 % EX OINT: TOPICAL_OINTMENT | Freq: Every day | CUTANEOUS | 1 refills | Status: DC

## 2023-07-29 NOTE — Telephone Encounter (Signed)
Per Pharmacy: please resend for arnuity per insurance. thanks.

## 2023-07-29 NOTE — Progress Notes (Unsigned)
Had gradual inc in SOB without hypoxia at home.  Hasn't used inhalers recently.  No CP. No cough.  Sleeping with head elevated at baseline- no change.  Discussed with patient about restarting her routine inhaler use.  History of cat scratch.  Recheck today. R shin with 6x4 irregular scabbed area that doesn't appear infected.  No fevers.  Meds, vitals, and allergies reviewed.   ROS: Per HPI unless specifically indicated in ROS section   Nad Nat Neck supple, no LA rrr Ctab no wheeze.   R shin with 6x4 irregular scabbed area that doesn't appear infected.

## 2023-07-29 NOTE — Patient Instructions (Addendum)
Gently press a warm damp washcloth on the scab between dressing changes.   Restart flovent inhaler.    Labs today.   If you have mychart we'll likely use that to update you.     Take care.  Glad to see you.

## 2023-07-30 NOTE — Telephone Encounter (Signed)
Sent. Thanks.  Notify pt to use once a day, not twice.

## 2023-07-31 ENCOUNTER — Other Ambulatory Visit: Payer: Self-pay | Admitting: Family Medicine

## 2023-07-31 MED ORDER — FUROSEMIDE 20 MG PO TABS: 20.0000 mg | ORAL_TABLET | Freq: Every day | ORAL | 0 refills | Status: DC | PRN

## 2023-07-31 NOTE — Assessment & Plan Note (Signed)
See notes on labs.  Lungs are clear and okay for outpatient follow-up.  Would restart fluticasone inhaler in the meantime to see if that helps.  No focal decrease in breath sounds.  No wheeze on exam.

## 2023-07-31 NOTE — Assessment & Plan Note (Signed)
Appears chronically scabbed but not infected. I asked her to gently press a warm damp washcloth on the scab between dressing changes.  This should eventually flake off and heal over.  Discussed adequate protein intake.

## 2023-08-07 ENCOUNTER — Encounter: Payer: Self-pay | Admitting: Cardiovascular Disease

## 2023-08-07 ENCOUNTER — Encounter: Payer: Self-pay | Admitting: Family Medicine

## 2023-08-07 MED ORDER — FUROSEMIDE 20 MG PO TABS: 20.0000 mg | ORAL_TABLET | Freq: Every day | ORAL | 0 refills | Status: DC | PRN

## 2023-08-12 ENCOUNTER — Telehealth: Payer: Self-pay | Admitting: Family Medicine

## 2023-08-12 MED ORDER — FUROSEMIDE 20 MG PO TABS: 20.0000 mg | ORAL_TABLET | Freq: Every day | ORAL | 1 refills | Status: AC | PRN

## 2023-08-12 NOTE — Telephone Encounter (Signed)
Prescription Request  08/12/2023  LOV: 07/29/2023  What is the name of the medication or equipment? furosemide (LASIX) 20 MG tablet  Have you contacted your pharmacy to request a refill? Yes   Which pharmacy would you like this sent to?  [facility] - [place], [place] - [address] [place] [address] Phone: [phone] Fax: [phone]    Patient notified that their request is being sent to the clinical staff for review and that they should receive a response within 2 business days.   Please advise at Mobile [phone] (mobile)

## 2023-08-12 NOTE — Telephone Encounter (Signed)
Erx sent

## 2023-08-22 NOTE — Telephone Encounter (Signed)
I spoke with Dr Para March who thinks pt does need to be seen; I spoke with [facility] again and she scheduled appt with Dr Ermalene Searing 08/23/23 at 11:20. Lynden Ang is going to see if she can get someone to bring pt to appt. Sending note to Dr Para March as PCP and Dr Ermalene Searing.

## 2023-08-22 NOTE — Telephone Encounter (Signed)
August 22, 2023 Wilburn Corn[place], [place]  to [facility] Triage     08/22/23  3:18 PM  Please triage patient Jillian Oconnell A [place]  to [facility] Clinical Pool (supporting Joaquim Nam, MD)      08/22/23  2:45 PM Hi Doc..the fluid pills didn't seem to make a differrence..she Says she is still out of breath when she tries to talk.  Is she is chronic heart failure because of the fluid in her lungs?..she is scheduled to see her heart dr in oct   I spoke with Lynden Ang [facility] signed); and p=t is with Lynden Ang now. [name] said pt has been taking furosemide 20mg  daily for over 1 wk. Fluid pill not helping SOB.Pt is using flovent inhaler which may be helping small amt. No swelling in legs or abdomen. No CP. No blueness around lips, pt is not on home oxygen. Pt has SOB when tries to talk. No SOB with just sitting. Pt said it is like no air is coming out to form words when talking. [name] said pt is not in distress and does not need to go to ED. Offered to schedule appt with Dr Ermalene Searing on 08/23/23 but Lynden Ang has to work and not sure if can get other form of transportation.no appt scheduled.UC & ED precautions given and [name] voiced understanding. [name] would like response either by my chart of phone today if possible. Sending note to Dr Para March and Para March pool.and will walk around to let PCP or CMA know.

## 2023-08-23 ENCOUNTER — Encounter: Payer: Self-pay | Admitting: Family Medicine

## 2023-08-23 ENCOUNTER — Ambulatory Visit (INDEPENDENT_AMBULATORY_CARE_PROVIDER_SITE_OTHER): Payer: Medicare HMO | Admitting: Family Medicine

## 2023-08-23 VITALS — BP 140/70 | HR 61 | Temp 98.3°F | Ht 62.0 in | Wt 107.5 lb

## 2023-08-23 DIAGNOSIS — W5503XA Scratched by cat, initial encounter: Secondary | ICD-10-CM

## 2023-08-23 DIAGNOSIS — R0602 Shortness of breath: Secondary | ICD-10-CM | POA: Diagnosis not present

## 2023-08-23 DIAGNOSIS — K219 Gastro-esophageal reflux disease without esophagitis: Secondary | ICD-10-CM

## 2023-08-23 DIAGNOSIS — R490 Dysphonia: Secondary | ICD-10-CM

## 2023-08-23 LAB — BASIC METABOLIC PANEL
BUN: 33 mg/dL — ABNORMAL HIGH (ref 6–23)
CO2: 34 meq/L — ABNORMAL HIGH (ref 19–32)
Calcium: 9.6 mg/dL (ref 8.4–10.5)
Chloride: 102 meq/L (ref 96–112)
Creatinine, Ser: 1.71 mg/dL — ABNORMAL HIGH (ref 0.40–1.20)
GFR: 26.75 mL/min — ABNORMAL LOW (ref >60.00)
Glucose, Bld: 98 mg/dL (ref 70–99)
Potassium: 4.8 meq/L (ref 3.5–5.1)
Sodium: 142 meq/L (ref 135–145)

## 2023-08-23 LAB — BRAIN NATRIURETIC PEPTIDE: Pro B Natriuretic peptide (BNP): 518 pg/mL — ABNORMAL HIGH (ref 0.0–100.0)

## 2023-08-23 NOTE — Assessment & Plan Note (Signed)
Subacute, per patient mainly only happening when talking, minimal exertional symptoms.  No peripheral edema.  Minimal improvement with Lasix. Lungs at baseline with decreased breath sounds on the left but this is likely associated with chronic left marked hemidiaphragm elevation. No signs and symptoms of infection.  Will have her stop Lasix.  Reevaluate kidney function and potassium.  Recheck BNP, but doubt any heart failure.  Also of note no wheezing and minimal improvement restarting inhaled fluticasone.  Symptoms not clearly asthma related.  I feel most likely her shortness of breath with talking symptoms are connected to her hoarse voice quality and change in voice.  T

## 2023-08-23 NOTE — Telephone Encounter (Signed)
Agree. Thanks

## 2023-08-23 NOTE — Assessment & Plan Note (Signed)
Acute, significant worsening gradually since earlier this year.  Swallow study in May showed no swallowing issues or aspiration.  Her symptoms are more likely due to vocal cord issues such as reflux or vocal cord polyp.  Increase Prevacid to 30 mg daily, avoid reflux triggers. Refer to ear nose and throat for likely laryngoscopy to evaluate vocal cords.

## 2023-08-23 NOTE — Patient Instructions (Addendum)
Please stop at the lab to have labs drawn. Increase Prevacid to 30 mg daily. We will move forward with ENT referral to consider doing laryngoscopy to evaluate hoarse voice.  Stop lasix for now.

## 2023-08-23 NOTE — Assessment & Plan Note (Signed)
Acute, status post antibiotics.  Now no current evidence of local infection.  Encouraged her to wash the area with warm soapy water and continue warm compresses to help scab fall off.

## 2023-08-23 NOTE — Progress Notes (Signed)
Patient ID: Jillian Oconnell, female    DOB: February 25, 1934, 89 y.o.   MRN: [id number]  This visit was conducted in person.  BP (!) 140/70 (BP Location: Right Arm, Patient Position: Sitting, Cuff Size: Normal)   Pulse 61   Temp 98.3 F (36.8 C) (Temporal)   Ht 5\' 2"  (1.575 m)   Wt 107 lb 8 oz (48.8 kg)   SpO2 94%   BMI 19.66 kg/m    CC:  Chief Complaint  Patient presents with   Shortness of Breath    When Talking    No Appetite    Subjective:   HPI: Jillian Oconnell is a 89 y.o. female patient of Dr. Lianne Bushy with history of hypertension, asthma, PSVT, pulmonary hypertension presenting on 08/23/2023 for Shortness of Breath (When Talking/) and No Appetite  Reviewed recent office visit note from PCP July 29, 2023.  Commented on shortness of breath: Recommended restarting fluticasone inhaler.  Lungs were clear to auscultation bilaterally BNP was elevated   Cardiology Dr. Kirke Corin 10/31  ECHO 05/2022  EF 60-65%  severe mitral annular calcification   Today she  and daughter reporting  hoarse voice since January. Voice sound higher pitch  Had swallowing test done 04/30/2023.. normal swallowing, no aspiration  Gradually worsening... now SOB in last 2 months.  Only SOB when talking,  some increase with exertion.   Some trouble swallowing pills.  Some acid in throat, some passing fluctuance more than usual.  Some mucus production from throat but no cough.  She has been taking  lasix 20 mg daily  x 7-10 days now, some increase UOP.  No change in SOB   Fluticasone inhaler helped slightly.   On prevacid 15 mg daily   Past CXR chronic left hemidiaphragm elevation.  HX of TB and calcified hilar mediastinal lymph nodes.  Relevant past medical, surgical, family and social history reviewed and updated as indicated. Interim medical history since our last visit reviewed. Allergies and medications reviewed and updated. Outpatient Medications Prior to Visit  Medication Sig Dispense Refill    albuterol (VENTOLIN HFA) 108 (90 Base) MCG/ACT inhaler Inhale 1-2 puffs into the lungs every 6 (six) hours as needed for wheezing or shortness of breath. 1 each 0   amiodarone (PACERONE) 100 MG tablet Take 1 tablet (100 mg total) by mouth daily. 90 tablet 1   apixaban (ELIQUIS) 2.5 MG TABS tablet Take 1 tablet (2.5 mg total) by mouth 2 (two) times daily. 180 tablet 1   Calcium Carbonate-Vitamin D 600-400 MG-UNIT tablet Take 1 tablet by mouth 2 (two) times daily. Take 1 tablet by mouth two times a day     cyanocobalamin 500 MCG TABS Take 500 mcg by mouth daily.     ezetimibe (ZETIA) 10 MG tablet Take 1 tablet (10 mg total) by mouth daily. 90 tablet 3   Fluticasone Furoate (ARNUITY ELLIPTA) 200 MCG/ACT AEPB Take 1 Inhalation by mouth daily. Rinse after use. 30 each 5   furosemide (LASIX) 20 MG tablet Take 1 tablet (20 mg total) by mouth daily as needed for fluid. 90 tablet 1   HYDROcodone-acetaminophen (NORCO) 7.5-325 MG tablet Take 1 tablet by mouth 3 (three) times daily as needed (for pain.). Fill on/after 60 days from rx 90 tablet 0   lansoprazole (PREVACID) 15 MG capsule Take 1 capsule (15 mg total) by mouth daily. 90 capsule 3   levothyroxine (SYNTHROID) 25 MCG tablet Take 1 tablet (25 mcg total) by mouth daily. 90 tablet 1  LORazepam (ATIVAN) 1 MG tablet TAKE 1/2 (ONE-HALF) TABLET BY MOUTH TWICE DAILY AS NEEDED (SEDATION  CAUTION) (Patient taking differently: TAKE 1/2 (ONE-HALF) TABLET BY MOUTH TWICE DAILY AS NEEDED (SEDATION  CAUTION)) 30 tablet 0   losartan (COZAAR) 25 MG tablet Take 1 tablet (25 mg total) by mouth daily. To replacement prev rx 90 tablet 3   metoprolol succinate (TOPROL-XL) 100 MG 24 hr tablet TAKE 1 TABLET TWICE DAILY WITH OR IMMEDIATELY FOLLOWING A MEAL 180 tablet 3   mirtazapine (REMERON) 7.5 MG tablet Take 1 tablet (7.5 mg total) by mouth at bedtime. 90 tablet 3   montelukast (SINGULAIR) 10 MG tablet TAKE 1 TABLET AT BEDTIME 90 tablet 3   mupirocin ointment (BACTROBAN) 2 %  Apply topically daily. 15 g 1   ondansetron (ZOFRAN) 4 MG tablet Take 1 tablet (4 mg total) by mouth every 8 (eight) hours as needed for nausea or vomiting. 20 tablet 0   No facility-administered medications prior to visit.     Per HPI unless specifically indicated in ROS section below Review of Systems  Constitutional:  Negative for fatigue and fever.  HENT:  Positive for trouble swallowing and voice change. Negative for congestion.   Eyes:  Negative for pain.  Respiratory:  Positive for shortness of breath. Negative for cough.   Cardiovascular:  Negative for chest pain, palpitations and leg swelling.  Gastrointestinal:  Negative for abdominal pain.  Genitourinary:  Negative for dysuria and vaginal bleeding.  Musculoskeletal:  Negative for back pain.  Neurological:  Negative for syncope, light-headedness and headaches.  Psychiatric/Behavioral:  Negative for dysphoric mood.    Objective:  BP (!) 140/70 (BP Location: Right Arm, Patient Position: Sitting, Cuff Size: Normal)   Pulse 61   Temp 98.3 F (36.8 C) (Temporal)   Ht 5\' 2"  (1.575 m)   Wt 107 lb 8 oz (48.8 kg)   SpO2 94%   BMI 19.66 kg/m   Wt Readings from Last 3 Encounters:  08/23/23 107 lb 8 oz (48.8 kg)  07/29/23 106 lb (48.1 kg)  07/02/23 104 lb (47.2 kg)      Physical Exam Constitutional:      General: She is not in acute distress.    Appearance: Normal appearance. She is well-developed. She is not ill-appearing or toxic-appearing.     Comments: High-pitched scratchy hoarse voice  HENT:     Head: Normocephalic.     Right Ear: Hearing, tympanic membrane, ear canal and external ear normal. Tympanic membrane is not erythematous, retracted or bulging.     Left Ear: Hearing, tympanic membrane, ear canal and external ear normal. Tympanic membrane is not erythematous, retracted or bulging.     Nose: No mucosal edema or rhinorrhea.     Right Sinus: No maxillary sinus tenderness or frontal sinus tenderness.     Left  Sinus: No maxillary sinus tenderness or frontal sinus tenderness.     Mouth/Throat:     Mouth: Oropharynx is clear and moist and mucous membranes are normal.     Pharynx: Uvula midline.  Eyes:     General: Lids are normal. Lids are everted, no foreign bodies appreciated.     Extraocular Movements: EOM normal.     Conjunctiva/sclera: Conjunctivae normal.     Pupils: Pupils are equal, round, and reactive to light.  Neck:     Thyroid: No thyroid mass or thyromegaly.     Vascular: No carotid bruit.     Trachea: Trachea normal.  Cardiovascular:  Rate and Rhythm: Normal rate and regular rhythm.     Pulses: Normal pulses.     Heart sounds: Normal heart sounds, S1 normal and S2 normal. No murmur heard.    No friction rub. No gallop.  Pulmonary:     Effort: Pulmonary effort is normal. No tachypnea or respiratory distress.     Breath sounds: Examination of the left-middle field reveals decreased breath sounds. Examination of the left-lower field reveals decreased breath sounds. Decreased breath sounds present. No wheezing, rhonchi or rales.     Comments: Decreased breath sounds on the left are likely chronic associated with marked left hemidiaphragm elevation on past chest x-ray Abdominal:     General: Bowel sounds are normal.     Palpations: Abdomen is soft.     Tenderness: There is no abdominal tenderness.  Musculoskeletal:     Cervical back: Normal range of motion and neck supple.  Skin:    General: Skin is warm, dry and intact.     Findings: No rash.          Comments: Wound left anterior shin with large amount of scabbing minimal surrounding erythema, no discharge no odor  Neurological:     Mental Status: She is alert.  Psychiatric:        Mood and Affect: Mood is not anxious or depressed.        Speech: Speech normal.        Behavior: Behavior normal. Behavior is cooperative.        Thought Content: Thought content normal.        Cognition and Memory: Cognition and memory  normal.        Judgment: Judgment normal.       Results for orders placed or performed in visit on 07/29/23  CBC with Differential/Platelet  Result Value Ref Range   WBC 6.3 4.0 - 10.5 K/uL   RBC 4.10 3.87 - 5.11 Mil/uL   Hemoglobin 12.6 12.0 - 15.0 g/dL   HCT 29.5 28.4 - 13.2 %   MCV 95.7 78.0 - 100.0 fl   MCHC 32.1 30.0 - 36.0 g/dL   RDW 44.0 10.2 - 72.5 %   Platelets 307.0 150.0 - 400.0 K/uL   Neutrophils Relative % 56.2 43.0 - 77.0 %   Lymphocytes Relative 20.0 12.0 - 46.0 %   Monocytes Relative 13.7 (H) 3.0 - 12.0 %   Eosinophils Relative 9.3 (H) 0.0 - 5.0 %   Basophils Relative 0.8 0.0 - 3.0 %   Neutro Abs 3.5 1.4 - 7.7 K/uL   Lymphs Abs 1.3 0.7 - 4.0 K/uL   Monocytes Absolute 0.9 0.1 - 1.0 K/uL   Eosinophils Absolute 0.6 0.0 - 0.7 K/uL   Basophils Absolute 0.1 0.0 - 0.1 K/uL  Comprehensive metabolic panel  Result Value Ref Range   Sodium 137 135 - 145 mEq/L   Potassium 5.3 (H) 3.5 - 5.1 mEq/L   Chloride 98 96 - 112 mEq/L   CO2 31 19 - 32 mEq/L   Glucose, Bld 102 (H) 70 - 99 mg/dL   BUN 27 (H) 6 - 23 mg/dL   Creatinine, Ser 3.66 (H) 0.40 - 1.20 mg/dL   Total Bilirubin 0.4 0.2 - 1.2 mg/dL   Alkaline Phosphatase 59 39 - 117 U/L   AST 20 0 - 37 U/L   ALT 12 0 - 35 U/L   Total Protein 6.6 6.0 - 8.3 g/dL   Albumin 3.8 3.5 - 5.2 g/dL   GFR 44.03 (L) >47.42  mL/min   Calcium 9.0 8.4 - 10.5 mg/dL  Brain natriuretic peptide  Result Value Ref Range   Pro B Natriuretic peptide (BNP) 550.0 (H) 0.0 - 100.0 pg/mL  TSH  Result Value Ref Range   TSH 4.15 0.35 - 5.50 uIU/mL    Assessment and Plan  Hoarse voice quality Assessment & Plan: Acute, significant worsening gradually since earlier this year.  Swallow study in May showed no swallowing issues or aspiration.  Her symptoms are more likely due to vocal cord issues such as reflux or vocal cord polyp.  Increase Prevacid to 30 mg daily, avoid reflux triggers. Refer to ear nose and throat for likely laryngoscopy to  evaluate vocal cords.   Gastroesophageal reflux disease, unspecified whether esophagitis present  SOB (shortness of breath) Assessment & Plan: Subacute, per patient mainly only happening when talking, minimal exertional symptoms.  No peripheral edema.  Minimal improvement with Lasix. Lungs at baseline with decreased breath sounds on the left but this is likely associated with chronic left marked hemidiaphragm elevation. No signs and symptoms of infection.  Will have her stop Lasix.  Reevaluate kidney function and potassium.  Recheck BNP, but doubt any heart failure.  Also of note no wheezing and minimal improvement restarting inhaled fluticasone.  Symptoms not clearly asthma related.  I feel most likely her shortness of breath with talking symptoms are connected to her hoarse voice quality and change in voice.  T  Orders: -     Basic metabolic panel -     Brain natriuretic peptide  Cat scratch Assessment & Plan: Acute, status post antibiotics.  Now no current evidence of local infection.  Encouraged her to wash the area with warm soapy water and continue warm compresses to help scab fall off.     No follow-ups on file.   Kerby Nora, MD

## 2023-08-24 ENCOUNTER — Other Ambulatory Visit: Payer: Self-pay | Admitting: Cardiovascular Disease

## 2023-09-03 ENCOUNTER — Telehealth (INDEPENDENT_AMBULATORY_CARE_PROVIDER_SITE_OTHER): Payer: Medicare HMO | Admitting: Internal Medicine

## 2023-09-03 ENCOUNTER — Encounter: Payer: Self-pay | Admitting: Internal Medicine

## 2023-09-03 DIAGNOSIS — U071 COVID-19: Secondary | ICD-10-CM | POA: Diagnosis not present

## 2023-09-03 NOTE — Progress Notes (Signed)
Subjective:    Patient ID: Jillian Oconnell, female    DOB: 1934/08/22, 89 y.o.   MRN: [id number]  HPI Video virtual visit due to COVID infection Identification done Reviewed limitations and billing and she gave consent Participants--patient and daughter  (she helps facilitate visit due hearing problems) in her home and I am in my office  Sick for 3-4 days Has had cough and headache No sore throat No fever No myalgias or chills No SOB  COVID test positive yesterday Did have vaccine last fall  No meds for this--even tylenol (but has hydrocodone/APAP)  Current Outpatient Medications on File Prior to Visit  Medication Sig Dispense Refill   amiodarone (PACERONE) 100 MG tablet TAKE 1 TABLET EVERY DAY (DOSE CHANGE) 90 tablet 0   apixaban (ELIQUIS) 2.5 MG TABS tablet Take 1 tablet (2.5 mg total) by mouth 2 (two) times daily. 180 tablet 1   Calcium Carbonate-Vitamin D 600-400 MG-UNIT tablet Take 1 tablet by mouth 2 (two) times daily. Take 1 tablet by mouth two times a day     ezetimibe (ZETIA) 10 MG tablet Take 1 tablet (10 mg total) by mouth daily. 90 tablet 3   Fluticasone Furoate (ARNUITY ELLIPTA) 200 MCG/ACT AEPB Take 1 Inhalation by mouth daily. Rinse after use. 30 each 5   furosemide (LASIX) 20 MG tablet Take 1 tablet (20 mg total) by mouth daily as needed for fluid. 90 tablet 1   HYDROcodone-acetaminophen (NORCO) 7.5-325 MG tablet Take 1 tablet by mouth 3 (three) times daily as needed (for pain.). Fill on/after 60 days from rx 90 tablet 0   lansoprazole (PREVACID) 15 MG capsule Take 1 capsule (15 mg total) by mouth daily. 90 capsule 3   levothyroxine (SYNTHROID) 25 MCG tablet Take 1 tablet (25 mcg total) by mouth daily. 90 tablet 1   LORazepam (ATIVAN) 1 MG tablet TAKE 1/2 (ONE-HALF) TABLET BY MOUTH TWICE DAILY AS NEEDED (SEDATION  CAUTION) (Patient taking differently: TAKE 1/2 (ONE-HALF) TABLET BY MOUTH TWICE DAILY AS NEEDED (SEDATION  CAUTION)) 30 tablet 0   losartan (COZAAR) 25 MG  tablet Take 1 tablet (25 mg total) by mouth daily. To replacement prev rx 90 tablet 3   metoprolol succinate (TOPROL-XL) 100 MG 24 hr tablet TAKE 1 TABLET TWICE DAILY WITH OR IMMEDIATELY FOLLOWING A MEAL 180 tablet 3   mirtazapine (REMERON) 7.5 MG tablet Take 1 tablet (7.5 mg total) by mouth at bedtime. 90 tablet 3   montelukast (SINGULAIR) 10 MG tablet TAKE 1 TABLET AT BEDTIME 90 tablet 3   mupirocin ointment (BACTROBAN) 2 % Apply topically daily. 15 g 1   ondansetron (ZOFRAN) 4 MG tablet Take 1 tablet (4 mg total) by mouth every 8 (eight) hours as needed for nausea or vomiting. 20 tablet 0   cyanocobalamin 500 MCG TABS Take 500 mcg by mouth daily. (Patient not taking: Reported on 09/03/2023)     No current facility-administered medications on file prior to visit.    Allergies  Allergen Reactions   Alendronate Sodium     REACTION: GI side eff   Amoxicillin-Pot Clavulanate     REACTION: non tolerant- but can take plain amox   Atorvastatin Nausea And Vomiting   Azithromycin     GI upset.  Not an allergy.   Ezetimibe-Simvastatin     REACTION: myalgia   Gabapentin     REACTION: tremor   Ibandronate Sodium     REACTION: GI side eff   Lexapro [Escitalopram Oxalate]  sedation   Lyrica [Pregabalin] Other (See Comments)    heart racing and didn't help pain.    Neosporin [Bacitracin-Polymyxin B]     Irritation locally   Risedronate Sodium     REACTION: GI side eff   Rosuvastatin     REACTION: myalgia   Sertraline     sedation   Sulfonamide Derivatives     REACTION: not tolerate    Past Medical History:  Diagnosis Date   Actinic keratosis    Allergy    Anxiety    Asthma    Concussion    with fall at home 11-22-2016   Depression    GERD (gastroesophageal reflux disease)    Glaucoma    Hyperlipidemia    Hypertension    IBS (irritable bowel syndrome)    OA (osteoarthritis) of knee    injections   Osteopenia    DXA 22-Nov-2010   Palpitations    Paroxysmal supraventricular  tachycardia May of 2005   Positive TB test    had TB as a child - with granulomas in L lung    Shingles    post herpetic neuralgia (pain clinic in past)   Squamous cell carcinoma of skin 03/07/2015   right preauricular    Stroke [facility])     Past Surgical History:  Procedure Laterality Date   BREAST EXCISIONAL BIOPSY Right 1988   NEG   CARDIAC CATHETERIZATION  01/2008   65% ;[facility]. Minor luminal irregularities with no evidence of obstructive disease.   cataract surgery     GALLBLADDER SURGERY  1997   RECTOCELE REPAIR  11/23/2003   TOTAL ABDOMINAL HYSTERECTOMY      Family History  Problem Relation Age of Onset   Hypertension Mother    Breast cancer Mother 58   Hypertension Father    Aneurysm Father        AAA   Cancer Sister        colon   Colon cancer Sister    Cancer Daughter        thyroid   Anxiety disorder Daughter    Breast cancer Daughter 57   Breast cancer Daughter     Social History   Socioeconomic History   Marital status: Widowed    Spouse name: Not on file   Number of children: Not on file   Years of education: Not on file   Highest education level: Not on file  Occupational History   Occupation: Retired    Associate Professor: retired    Comment: Homemaker  Tobacco Use   Smoking status: Never   Smokeless tobacco: Never  Vaping Use   Vaping status: Never Used  Substance and Sexual Activity   Alcohol use: No    Alcohol/week: 0.0 standard drinks of alcohol   Drug use: No   Sexual activity: Not Currently    Birth control/protection: Post-menopausal  Other Topics Concern   Not on file  Social History Narrative   Regular exercise- no    Moved here from [place]- to be closer to family    Husband died of lung CA 22-Nov-2013   Social Determinants of Health   Financial Resource Strain: Low Risk  (12/14/2022)   Overall Financial Resource Strain (CARDIA)    Difficulty of Paying Living Expenses: Not hard at all  Food Insecurity: No Food Insecurity (07/03/2023)   Hunger Vital  Sign    Worried About Running Out of Food in the Last Year: Never true    Ran Out of Food in the  Last Year: Never true  Transportation Needs: No Transportation Needs (07/03/2023)   PRAPARE - Administrator, Civil Service (Medical): No    Lack of Transportation (Non-Medical): No  Physical Activity: Inactive (12/04/2021)   Exercise Vital Sign    Days of Exercise per Week: 0 days    Minutes of Exercise per Session: 0 min  Stress: No Stress Concern Present (12/14/2022)   [employer] of Occupational Health - Occupational Stress Questionnaire    Feeling of Stress : Not at all  Social Connections: Socially Isolated (12/14/2022)   Social Connection and Isolation Panel [NHANES]    Frequency of Communication with Friends and Family: More than three times a week    Frequency of Social Gatherings with Friends and Family: More than three times a week    Attends Religious Services: Never    Database administrator or Organizations: No    Attends Banker Meetings: Never    Marital Status: Widowed  Intimate Partner Violence: Not At Risk (12/14/2022)   Humiliation, Afraid, Rape, and Kick questionnaire    Fear of Current or Ex-Partner: No    Emotionally Abused: No    Physically Abused: No    Sexually Abused: No     Review of Systems No loss in taste or smell Small appetite in general --no change Slight nausea in AM---no vomiting    Objective:   Physical Exam Constitutional:      Appearance: Normal appearance.  Pulmonary:     Effort: Pulmonary effort is normal. No respiratory distress.  Neurological:     Mental Status: She is alert.            Assessment & Plan:

## 2023-09-03 NOTE — Assessment & Plan Note (Signed)
Clearly having a mild infection Due to eliquis--can't really use the paxlovid---and not sure it is appropriate given how mild her symptoms are Did recommend extra tylenol if needed If worsens with SOB--should go to ER Isolate for next couple of days--then mask if going out

## 2023-09-10 ENCOUNTER — Ambulatory Visit (INDEPENDENT_AMBULATORY_CARE_PROVIDER_SITE_OTHER)
Admission: RE | Admit: 2023-09-10 | Discharge: 2023-09-10 | Disposition: A | Discharge status: Home / Self Care | Payer: Medicare HMO | Source: Ambulatory Visit | Attending: Family Medicine | Admitting: Family Medicine

## 2023-09-10 ENCOUNTER — Ambulatory Visit (INDEPENDENT_AMBULATORY_CARE_PROVIDER_SITE_OTHER): Payer: Medicare HMO | Admitting: Family Medicine

## 2023-09-10 ENCOUNTER — Encounter: Payer: Self-pay | Admitting: Family Medicine

## 2023-09-10 VITALS — BP 108/72 | HR 80 | Temp 98.8°F | Wt 101.0 lb

## 2023-09-10 DIAGNOSIS — R499 Unspecified voice and resonance disorder: Secondary | ICD-10-CM

## 2023-09-10 DIAGNOSIS — R9389 Abnormal findings on diagnostic imaging of other specified body structures: Secondary | ICD-10-CM | POA: Diagnosis not present

## 2023-09-10 DIAGNOSIS — R059 Cough, unspecified: Secondary | ICD-10-CM

## 2023-09-10 DIAGNOSIS — R918 Other nonspecific abnormal finding of lung field: Secondary | ICD-10-CM | POA: Diagnosis not present

## 2023-09-10 MED ORDER — FLUTICASONE PROPIONATE 50 MCG/ACT NA SUSP: 2.0000 | Freq: Every day | NASAL | 6 refills | Status: AC

## 2023-09-10 MED ORDER — AMOXICILLIN 875 MG PO TABS: 875.0000 mg | ORAL_TABLET | Freq: Two times a day (BID) | ORAL | 0 refills | Status: AC

## 2023-09-10 MED ORDER — DOXYCYCLINE HYCLATE 100 MG PO TABS: 100.0000 mg | ORAL_TABLET | Freq: Two times a day (BID) | ORAL | 0 refills | Status: DC

## 2023-09-10 MED ORDER — BENZONATATE 200 MG PO CAPS: 200.0000 mg | ORAL_CAPSULE | Freq: Three times a day (TID) | ORAL | 1 refills | Status: DC | PRN

## 2023-09-10 NOTE — Patient Instructions (Signed)
Xray on the way out.  Start amoxil and doxycycline.  Tessalon for cough.  Flonase if needed.   You should get a call about seeing ENT about your voice.  Update [place] as needed.  Take care.  Glad to see you.

## 2023-09-10 NOTE — Progress Notes (Unsigned)
Since she had covid couple weeks ago. Patient states its somewhat productive at times. Has been taking otc cough meds and perles sometimes.  Tessalon helped.   Voice is still altered.  Still cough.  Some sputum.  No fevers.  She feels better overall.    D/w pt about ENT eval for voice change that predates covid infection.    Discussed using flonase for rhinorrhea.  Meds, vitals, and allergies reviewed.   ROS: Per HPI unless specifically indicated in ROS section   Rhonchi on the R side.   Hoarse voice at baseline- this isn't new.

## 2023-09-11 ENCOUNTER — Encounter: Payer: Self-pay | Admitting: *Deleted

## 2023-09-11 DIAGNOSIS — R499 Unspecified voice and resonance disorder: Secondary | ICD-10-CM | POA: Insufficient documentation

## 2023-09-11 NOTE — Assessment & Plan Note (Signed)
Discussed postinfectious cough versus possibility of pneumonia.  Xray on the way out.  Start amoxil and doxycycline.  Tessalon for cough.  Flonase if needed.   Rest and fluids in the meantime.  Routine cautions given to patient.  She agrees to plan.

## 2023-09-11 NOTE — Assessment & Plan Note (Signed)
Refer to ENT.  This predates recent illness.

## 2023-09-12 MED ORDER — ARNUITY ELLIPTA 200 MCG/ACT IN AEPB: 1.0000 | INHALATION_SPRAY | Freq: Every day | RESPIRATORY_TRACT | 5 refills | Status: AC

## 2023-09-12 NOTE — Addendum Note (Signed)
Addended by: Wendie Simmer B on: 09/12/2023 08:43 AM   Modules accepted: Orders

## 2023-10-14 DIAGNOSIS — J3801 Paralysis of vocal cords and larynx, unilateral: Secondary | ICD-10-CM | POA: Diagnosis not present

## 2023-10-14 DIAGNOSIS — H903 Sensorineural hearing loss, bilateral: Secondary | ICD-10-CM | POA: Diagnosis not present

## 2023-10-16 ENCOUNTER — Other Ambulatory Visit: Payer: Self-pay | Admitting: Otolaryngology

## 2023-10-16 DIAGNOSIS — J3801 Paralysis of vocal cords and larynx, unilateral: Secondary | ICD-10-CM

## 2023-10-17 ENCOUNTER — Encounter: Payer: Self-pay | Admitting: Cardiovascular Disease

## 2023-10-17 ENCOUNTER — Ambulatory Visit: Payer: Medicare HMO | Attending: Cardiovascular Disease | Admitting: Cardiovascular Disease

## 2023-10-17 VITALS — BP 140/72 | HR 61 | Ht <= 58 in | Wt 106.5 lb

## 2023-10-17 DIAGNOSIS — I10 Essential (primary) hypertension: Secondary | ICD-10-CM

## 2023-10-17 DIAGNOSIS — I872 Venous insufficiency (chronic) (peripheral): Secondary | ICD-10-CM

## 2023-10-17 DIAGNOSIS — I480 Paroxysmal atrial fibrillation: Secondary | ICD-10-CM | POA: Diagnosis not present

## 2023-10-17 DIAGNOSIS — I493 Ventricular premature depolarization: Secondary | ICD-10-CM | POA: Diagnosis not present

## 2023-10-17 NOTE — Patient Instructions (Signed)
Medication Instructions:  No changes *If you need a refill on your cardiac medications before your next appointment, please call your pharmacy*   Lab Work: None ordered If you have labs (blood work) drawn today and your tests are completely normal, you will receive your results only by: MyChart Message (if you have MyChart) OR A paper copy in the mail If you have any lab test that is abnormal or we need to change your treatment, we will call you to review the results.   Testing/Procedures: None ordered   Follow-Up: At [facility], you and your health needs are our priority.  As part of our continuing mission to provide you with exceptional heart care, we have created designated Provider Care Teams.  These Care Teams include your primary Cardiologist (physician) and Advanced Practice Providers (APPs -  Physician Assistants and Nurse Practitioners) who all work together to provide you with the care you need, when you need it.  We recommend signing up for the patient portal called "MyChart".  Sign up information is provided on this After Visit Summary.  MyChart is used to connect with patients for Virtual Visits (Telemedicine).  Patients are able to view lab/test results, encounter notes, upcoming appointments, etc.  Non-urgent messages can be sent to your provider as well.   To learn more about what you can do with MyChart, go to [email or web address].    Your next appointment:   6 month(s)  Provider:   You may see Muhammad Arida, MD or one of the following Advanced Practice Providers on your designated Care Team:   Christopher Berge, NP Ryan Dunn, PA-C Cadence Furth, PA-C Sheri Hammock, NP    

## 2023-10-17 NOTE — Progress Notes (Signed)
Cardiology Office Note   Date:  10/17/2023   ID:  Jillian Oconnell, DOB 08-27-1934, MRN [id number]  PCP:  Joaquim Nam, MD  Cardiologist:   Lorine Bears, MD   Chief Complaint  Patient presents with   6 month follow up     "Doing well." Medications reviewed by the patient verbally. Patient will have a neck CT scan on 10/18/2023.        History of Present Illness: Jillian Oconnell is a 89 y.o. female who presents for a followup visit regarding frequent PVCs, nonobstructive coronary artery disease and paroxysmal atrial fibrillation.   She had previous cardiac catheterization in February of 2009 which showed minor luminal irregularities with no evidence of obstructive disease. Ejection fraction was normal. Echocardiogram in 09/2016 showed normal LV systolic function, No significant valvular abnormalities and normal pulmonary pressure.    Previous monitor in 2017 showed very frequent PVCs with a total of 34,000 beats representing 25% burden.   PVCs have been well controlled with Toprol.  She was hospitalized in June 2023 with slurred speech and was found to have acute CVA.  Outpatient monitor showed short runs of SVT but no atrial fibrillation.  However, she was subsequently seen in September and was noted to be in atrial fibrillation with rapid ventricular rate.  She converted to sinus rhythm with amiodarone.    She has been doing well from a cardiac standpoint with no chest pain, shortness of breath outpatient.  She did have elevated BNP recently to the 400 range and she takes furosemide 20 mg only as needed.  She has not required this medication.  She has lower extremity edema that is worse at the end of the day.  She had a change in her voice and will be undergoing CT scan of the neck tomorrow.   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 Minor luminal irregularities with no evidence of obstructive disease.   cataract surgery     GALLBLADDER SURGERY  1997   RECTOCELE REPAIR  2004   TOTAL ABDOMINAL HYSTERECTOMY       Current Outpatient Medications  Medication Sig Dispense Refill   amiodarone (PACERONE) 100 MG tablet TAKE 1 TABLET EVERY DAY (DOSE CHANGE) 90 tablet 0   apixaban (ELIQUIS) 2.5 MG TABS tablet Take 1 tablet (2.5 mg total) by mouth 2 (two) times daily. 180 tablet 1   Calcium Carbonate-Vitamin D 600-400 MG-UNIT tablet Take 1 tablet by mouth 2 (two) times daily. Take 1 tablet by  mouth two times a day     ezetimibe (ZETIA) 10 MG tablet Take 1 tablet (10 mg total) by mouth daily. 90 tablet 3   fluticasone (FLONASE) 50 MCG/ACT nasal spray Place 2 sprays into both nostrils daily. 16 g 6   Fluticasone Furoate (ARNUITY ELLIPTA) 200 MCG/ACT AEPB Take 1 Inhalation by mouth daily. Rinse after use. 30 each 5   furosemide (LASIX) 20 MG tablet Take 1 tablet (20 mg total) by mouth daily as needed for fluid. 90 tablet 1   HYDROcodone-acetaminophen (NORCO) 7.5-325 MG tablet Take 1 tablet by mouth 3 (three) times daily as needed (for pain.). Fill on/after 60 days from rx 90 tablet 0   lansoprazole (PREVACID) 15 MG capsule Take 1 capsule (15 mg total) by mouth daily. 90 capsule 3   levothyroxine (SYNTHROID) 25 MCG tablet Take 1 tablet  (25 mcg total) by mouth daily. 90 tablet 1   losartan (COZAAR) 25 MG tablet Take 1 tablet (25 mg total) by mouth daily. To replacement prev rx 90 tablet 3   metoprolol succinate (TOPROL-XL) 100 MG 24 hr tablet TAKE 1 TABLET TWICE DAILY WITH OR IMMEDIATELY FOLLOWING A MEAL 180 tablet 3   mirtazapine (REMERON) 7.5 MG tablet Take 1 tablet (7.5 mg total) by mouth at bedtime. 90 tablet 3   montelukast (SINGULAIR) 10 MG tablet TAKE 1 TABLET AT BEDTIME 90 tablet 3   mupirocin ointment (BACTROBAN) 2 % Apply topically daily. 15 g 1   ondansetron (ZOFRAN) 4 MG tablet Take 1 tablet (4 mg total) by mouth every 8 (eight) hours as needed for nausea or vomiting. 20 tablet 0   benzonatate (TESSALON) 200 MG capsule Take 1 capsule (200 mg total) by mouth 3 (three) times daily as needed. (Patient not taking: Reported on 10/17/2023) 30 capsule 1   doxycycline (VIBRA-TABS) 100 MG tablet Take 1 tablet (100 mg total) by mouth 2 (two) times daily. (Patient not taking: Reported on 10/17/2023) 14 tablet 0   LORazepam (ATIVAN) 1 MG tablet TAKE 1/2 (ONE-HALF) TABLET BY MOUTH TWICE DAILY AS NEEDED (SEDATION  CAUTION) (Patient not taking: Reported on 10/17/2023) 30 tablet 0   No current facility-administered medications for this visit.    Allergies:   Alendronate sodium, Amoxicillin-pot clavulanate, Atorvastatin, Azithromycin, Ezetimibe-simvastatin, Gabapentin, Ibandronate sodium, Lexapro [escitalopram oxalate], Lyrica [pregabalin], Neosporin [bacitracin-polymyxin b], Risedronate sodium, Rosuvastatin, Sertraline, and Sulfonamide derivatives    Social History:  The patient  reports that she has never smoked. She has never used smokeless tobacco. She reports that she does not drink alcohol and does not use drugs.   Family History:  [name] family history includes Aneurysm in her father; Anxiety disorder in her daughter; Breast cancer in her daughter; Breast cancer (age of onset: 37) in her daughter; Breast cancer (age of  onset: 21) in her mother; Cancer in her daughter and sister; Colon cancer in her sister; Hypertension in her father and mother.    ROS:  Please see the history of present illness.   Otherwise, review of systems are positive for none.   All other systems are reviewed and negative.    PHYSICAL EXAM: VS:  BP (!) 140/72 (BP Location: Left Arm, Patient Position: Sitting, Cuff Size: Normal)   Pulse 61   Ht 4\' 9"  (1.448 m)   Wt 106 lb 8 oz (48.3 kg)   SpO2 93%   BMI 23.05 kg/m  , BMI Body mass index is 23.05 kg/m. GEN: Well nourished, well developed, in no acute distress  HEENT:  normal  Neck: no JVD, carotid bruits, or masses Cardiac: RRR ; no murmurs, rubs, or gallops, trace edema  Respiratory:  clear to auscultation bilaterally, normal work of breathing GI: soft, nontender, nondistended, + BS MS: no deformity or atrophy  Skin: warm and dry, no rash Neuro:  Strength and sensation are intact Psych: euthymic mood, full affect Vascular: Pedal pulses are palpable.  EKG:  EKG is  ordered today. EKG showed: Normal sinus rhythm When compared with ECG of 17-Apr-2014 14:09, Vent. rate has decreased BY  52 BPM      Recent Labs: 07/29/2023: ALT 12; Hemoglobin 12.6; Platelets 307.0; TSH 4.15 08/23/2023: BUN 33; Creatinine, Ser 1.71; Potassium 4.8; Pro B Natriuretic peptide (BNP) 518.0; Sodium 142    Lipid Panel    Component Value Date/Time   CHOL 166 06/06/2022 0427   TRIG 60 06/06/2022 0427   HDL 64 06/06/2022 0427   CHOLHDL 2.6 06/06/2022 0427   VLDL 12 06/06/2022 0427   LDLCALC 90 06/06/2022 0427   LDLCALC 116 (H) 08/07/2019 1507   LDLDIRECT 148.1 01/06/2013 1157      Wt Readings from Last 3 Encounters:  10/17/23 106 lb 8 oz (48.3 kg)  09/10/23 101 lb (45.8 kg)  08/23/23 107 lb 8 oz (48.8 kg)           No data to display            ASSESSMENT AND PLAN:  1.  Symptomatic PVCs: No evidence of PVCs by EKG or physical exam.  Continue Toprol and amiodarone.   2.   Paroxysmal atrial fibrillation: She is maintaining in sinus rhythm even after decreasing amiodarone to 100 mg once daily.  Continue anticoagulation with low-dose Eliquis 2.5 mg twice daily given her age and weight.   3. Essential hypertension: Her blood pressure is reasonably controlled considering her age.  4.  Lower extremity edema: Likely due to chronic venous insufficiency.  Her BNP was elevated to the 400 range but I am not seeing clear evidence of heart failure.  She has furosemide 20 mg to be used as needed.      Disposition:   FU with me in 6 months  Signed,  Lorine Bears, MD  10/17/2023 9:22 AM    [facility]

## 2023-10-18 ENCOUNTER — Ambulatory Visit
Admission: RE | Admit: 2023-10-18 | Discharge: 2023-10-18 | Disposition: A | Discharge status: Home / Self Care | Payer: Medicare HMO | Source: Ambulatory Visit | Attending: Otolaryngology | Admitting: Otolaryngology

## 2023-10-18 DIAGNOSIS — R490 Dysphonia: Secondary | ICD-10-CM | POA: Diagnosis not present

## 2023-10-18 DIAGNOSIS — J90 Pleural effusion, not elsewhere classified: Secondary | ICD-10-CM | POA: Diagnosis not present

## 2023-10-18 DIAGNOSIS — J811 Chronic pulmonary edema: Secondary | ICD-10-CM | POA: Diagnosis not present

## 2023-10-18 DIAGNOSIS — J3801 Paralysis of vocal cords and larynx, unilateral: Secondary | ICD-10-CM | POA: Diagnosis not present

## 2023-10-18 DIAGNOSIS — R918 Other nonspecific abnormal finding of lung field: Secondary | ICD-10-CM | POA: Diagnosis not present

## 2023-10-18 MED ORDER — IOPAMIDOL (ISOVUE-300) INJECTION 61%
60.0000 mL | Freq: Once | INTRAVENOUS | Status: AC | PRN
  Administered 2023-10-18: 60 mL via INTRAVENOUS

## 2023-11-01 ENCOUNTER — Encounter: Payer: Self-pay | Admitting: Family Medicine

## 2023-11-03 ENCOUNTER — Telehealth: Payer: Self-pay | Admitting: Family Medicine

## 2023-11-03 NOTE — Telephone Encounter (Signed)
Please triage patient about her dyspnea.  She has chronic voice changes for which she has seen ENT.  Please see about setting up an office visit when possible.  Thanks.

## 2023-11-04 NOTE — Telephone Encounter (Signed)
I spoke with Chip Boer (DPR signed)Jillian Oconnell said that pt only has SOB when trying to talk. Pt does not have SOB when exerting herself unless she tries to talk while doing something. Pt said she is seeing ENT and this started since the beginning of 2024 but has worsened in last 2 months.pt is in no distress No swelling in legs or abd. No CP. Pt has appt with Dr Para March on 11/07/23 at 2:30 p, with UC & ED precautions given and Chip Boer voiced understanding.this was first appt that worked with Jillian Oconnell's schedule. Chip Boer wondered about oxygen for in the home and I explained could talk with Dr Para March at appt and if needed the testing for oxygen could be done at that appt also. Chip Boer voiced understanding. Sending note to Dr Para March.,

## 2023-11-05 NOTE — Telephone Encounter (Signed)
Noted. Thanks.

## 2023-11-07 ENCOUNTER — Ambulatory Visit: Payer: Medicare HMO | Admitting: Family Medicine

## 2023-11-07 ENCOUNTER — Other Ambulatory Visit: Payer: Self-pay | Admitting: Cardiovascular Disease

## 2023-11-07 ENCOUNTER — Other Ambulatory Visit: Payer: Self-pay | Admitting: Family Medicine

## 2023-11-07 DIAGNOSIS — I480 Paroxysmal atrial fibrillation: Secondary | ICD-10-CM

## 2023-11-07 NOTE — Telephone Encounter (Signed)
Prescription refill request for Eliquis received. Indication: Afib  Last office visit: 10/17/23 Kirke Corin)  Scr: 1.71 (08/23/23)  Age: 89 Weight: 48.3kg  Appropriate dose. Refill sent.

## 2023-11-07 NOTE — Telephone Encounter (Signed)
Last office visit 10/17/23 with plan to f/u in 6 months next office visit: none/active recall

## 2023-11-08 ENCOUNTER — Encounter: Payer: Self-pay | Admitting: Family Medicine

## 2023-11-08 ENCOUNTER — Ambulatory Visit (INDEPENDENT_AMBULATORY_CARE_PROVIDER_SITE_OTHER): Payer: Medicare HMO | Admitting: Family Medicine

## 2023-11-08 VITALS — BP 134/72 | HR 65 | Temp 98.1°F | Ht <= 58 in | Wt 109.8 lb

## 2023-11-08 DIAGNOSIS — R499 Unspecified voice and resonance disorder: Secondary | ICD-10-CM

## 2023-11-08 DIAGNOSIS — R0602 Shortness of breath: Secondary | ICD-10-CM | POA: Diagnosis not present

## 2023-11-08 LAB — BRAIN NATRIURETIC PEPTIDE: Pro B Natriuretic peptide (BNP): 670 pg/mL — ABNORMAL HIGH (ref 0.0–100.0)

## 2023-11-08 LAB — BASIC METABOLIC PANEL
BUN: 23 mg/dL (ref 6–23)
CO2: 34 meq/L — ABNORMAL HIGH (ref 19–32)
Calcium: 9.2 mg/dL (ref 8.4–10.5)
Chloride: 102 meq/L (ref 96–112)
Creatinine, Ser: 1.45 mg/dL — ABNORMAL HIGH (ref 0.40–1.20)
GFR: 32.56 mL/min — ABNORMAL LOW (ref >60.00)
Glucose, Bld: 74 mg/dL (ref 70–99)
Potassium: 5 meq/L (ref 3.5–5.1)
Sodium: 140 meq/L (ref 135–145)

## 2023-11-08 NOTE — Progress Notes (Unsigned)
IMPRESSION: 1. Interlobular septal thickening with ground-glass nodularity most pronounced within the right upper lobe. Findings are favored to represent pulmonary edema. Superimposed infectious process not excluded. 2. Trace bilateral pleural effusions, left greater than right. 3. Chronic elevation of the left hemidiaphragm with atelectatic changes at the left lung base. 4. Aortic and coronary artery atherosclerosis (ICD10-I70.0).    IMPRESSION: 1. 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 Dilated left piriform sinus. Dilated left laryngeal ventricle. No evidence of mucosal or submucosal mass lesion. 2. No lymphadenopathy seen in the neck. No mass seen along the course of the recurrent laryngeal nerve on the left. Axial image 90 shows a 7 x 9 mm nodule that could represent a lymph node or possibly a vagus nerve schwannoma. Though not certain, this could be the relevant lesion in this case. 3. Atherosclerotic calcification at both carotid bifurcations but without flow limiting stenosis.    SOB only if talking but not if up and walking.    No recent lasix use.    She had to stop taking her thyroid medicine since she had trouble swallowing that.  She was able to swallow her other meds with pudding.  D/w pt about options, ie small amount of pudding with levothyroxine.    She got hearing aid yesterday.    BNP 518 this year.     Likely sounds more SOB than she actually is.  Check cr and BNP, then consider lasix.  If not better, consider ENT f/u.

## 2023-11-08 NOTE — Patient Instructions (Addendum)
I would try taking levothyroxine with a small amount of pudding and then take your other meds later.    Go to the lab on the way out.   If you have mychart we'll likely use that to update you.     Let me see about options for lasix use.   If no change with lasix use, then we need to consider extra ENT input.   Take care.  Glad to see you.

## 2023-11-10 ENCOUNTER — Encounter: Payer: Self-pay | Admitting: Family Medicine

## 2023-11-10 NOTE — Assessment & Plan Note (Signed)
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  Discussed options.  It may be that she sounds more short of breath than she actually is.  If she is having difficulty with effective vocal cord adduction, she may not be able to generate a persistent pressure load inferior to the vocal cords to sustain prolonged speech with one breath, which would make her sound short of breath.    We also need to make sure that she does not have any other significant causes of shortness of breath.  Her exertional capacity when she is not talking is still relatively good.  I think it makes sense to check Cr and BNP, then consider lasix.  If not better, consider ENT f/u.   She agrees to plan.   35 minutes were devoted to patient care in this encounter (this includes time spent reviewing the patient's file/history, interviewing and examining the patient, counseling/reviewing plan with patient).

## 2023-11-12 ENCOUNTER — Other Ambulatory Visit: Payer: Self-pay | Admitting: Family Medicine

## 2023-11-12 MED ORDER — HYDROCODONE-ACETAMINOPHEN 7.5-325 MG PO TABS: 1.0000 | ORAL_TABLET | Freq: Three times a day (TID) | ORAL | 0 refills | Status: DC | PRN

## 2023-11-12 MED ORDER — LANSOPRAZOLE 15 MG PO CPDR: 15.0000 mg | DELAYED_RELEASE_CAPSULE | Freq: Two times a day (BID) | ORAL | 3 refills | Status: DC

## 2023-11-28 ENCOUNTER — Ambulatory Visit: Payer: Medicare HMO

## 2023-11-28 VITALS — Ht <= 58 in | Wt 109.0 lb

## 2023-11-28 DIAGNOSIS — Z0000 Encounter for general adult medical examination without abnormal findings: Secondary | ICD-10-CM

## 2023-11-28 NOTE — Patient Instructions (Signed)
Jillian Oconnell , Thank you for taking time to come for your Medicare Wellness Visit. I appreciate your ongoing commitment to your health goals. Please review the following plan we discussed and let me know if I can assist you in the future.   Referrals/Orders/Follow-Ups/Clinician Recommendations: none  This is a list of the screening recommended for you and due dates:  Health Maintenance  Topic Date Due   Zoster (Shingles) Vaccine (1 of 2) Never done   Mammogram  09/26/2021   Medicare Annual Wellness Visit  11/27/2024   DTaP/Tdap/Td vaccine (3 - Tdap) 06/06/2033   Pneumonia Vaccine  Completed   Flu Shot  Completed   DEXA scan (bone density measurement)  Completed   HPV Vaccine  Aged Out   COVID-19 Vaccine  Discontinued    Advanced directives: (Copy Requested) Please bring a copy of your health care power of attorney and living will to the office to be added to your chart at your convenience.  Next Medicare Annual Wellness Visit scheduled for next year: Yes 11/30/24 @ 3pm telephone

## 2023-11-28 NOTE — Progress Notes (Signed)
Subjective:   Jillian Oconnell is a 89 y.o. female who presents for Medicare Annual (Subsequent) preventive examination.  Visit Complete: Virtual I connected with  Lemont Fillers Metzner on 11/28/23 by a audio enabled telemedicine application and verified that I am speaking with the correct person using two identifiers.  Patient Location: Home  Provider Location: Home Office  I discussed the limitations of evaluation and management by telemedicine. The patient expressed understanding and agreed to proceed.  Vital Signs: Because this visit was a virtual/telehealth visit, some criteria may be missing or patient reported. Any vitals not documented were not able to be obtained and vitals that have been documented are patient reported.  Patient Medicare AWV questionnaire was completed by the patient on (not done); I have confirmed that all information answered by patient is correct and no changes since this date.  Cardiac Risk Factors include: advanced age (>2men, >16 women);dyslipidemia;hypertension;sedentary lifestyle     Objective:    Today's Vitals   11/28/23 1453  Weight: 109 lb (49.4 kg)   Body mass index is 23.59 kg/m.     11/28/2023    3:02 PM 12/14/2022   12:25 PM 06/06/2022    3:00 AM 12/04/2021    1:19 PM 07/30/2018   11:51 AM 04/23/2017    1:52 PM  Advanced Directives  Does Patient Have a Medical Advance Directive? Yes Yes No No No Yes  Type of Advance Directive Healthcare Power of [employer] Power of Attorney  Does patient want to make changes to medical advance directive?  No - Patient declined      Copy of Healthcare Power of Attorney in Chart? No - copy requested     No - copy requested  Would patient like information on creating a medical advance directive?   No - Patient declined Yes (MAU/Ambulatory/Procedural Areas - Information given) Yes (MAU/Ambulatory/Procedural Areas - Information given)     Current Medications (verified) Outpatient Encounter Medications  as of 11/28/2023  Medication Sig   amiodarone (PACERONE) 100 MG tablet TAKE 1 TABLET EVERY DAY (DOSE CHANGE)   apixaban (ELIQUIS) 2.5 MG TABS tablet Take 1 tablet (2.5 mg total) by mouth 2 (two) times daily.   Calcium Carbonate-Vitamin D 600-400 MG-UNIT tablet Take 1 tablet by mouth 2 (two) times daily. Take 1 tablet by mouth two times a day   ezetimibe (ZETIA) 10 MG tablet Take 1 tablet (10 mg total) by mouth daily.   fluticasone (FLONASE) 50 MCG/ACT nasal spray Place 2 sprays into both nostrils daily.   Fluticasone Furoate (ARNUITY ELLIPTA) 200 MCG/ACT AEPB Take 1 Inhalation by mouth daily. Rinse after use.   furosemide (LASIX) 20 MG tablet Take 1 tablet (20 mg total) by mouth daily as needed for fluid.   HYDROcodone-acetaminophen (NORCO) 7.5-325 MG tablet Take 1 tablet by mouth 3 (three) times daily as needed (for pain.).   HYDROcodone-acetaminophen (NORCO) 7.5-325 MG tablet Take 1 tablet by mouth 3 (three) times daily as needed (for pain.). Fill on/after 30 days from rx   HYDROcodone-acetaminophen (NORCO) 7.5-325 MG tablet Take 1 tablet by mouth 3 (three) times daily as needed (for pain.). Fill on/after 60 days from rx   lansoprazole (PREVACID) 15 MG capsule Take 1 capsule (15 mg total) by mouth 2 (two) times daily before a meal.   levothyroxine (SYNTHROID) 25 MCG tablet Take 1 tablet (25 mcg total) by mouth daily.   losartan (COZAAR) 25 MG tablet Take 1 tablet (25 mg total) by mouth daily.  To replacement prev rx   metoprolol succinate (TOPROL-XL) 100 MG 24 hr tablet TAKE 1 TABLET TWICE DAILY WITH OR IMMEDIATELY FOLLOWING A MEAL   mirtazapine (REMERON) 7.5 MG tablet Take 1 tablet (7.5 mg total) by mouth at bedtime.   montelukast (SINGULAIR) 10 MG tablet TAKE 1 TABLET AT BEDTIME   mupirocin ointment (BACTROBAN) 2 % Apply topically daily.   ondansetron (ZOFRAN) 4 MG tablet Take 1 tablet (4 mg total) by mouth every 8 (eight) hours as needed for nausea or vomiting.   No facility-administered  encounter medications on file as of 11/28/2023.    Allergies (verified) Alendronate sodium, Amoxicillin-pot clavulanate, Atorvastatin, Azithromycin, Ezetimibe-simvastatin, Gabapentin, Ibandronate sodium, Lexapro [escitalopram oxalate], Lyrica [pregabalin], Neosporin [bacitracin-polymyxin b], Risedronate sodium, Rosuvastatin, Sertraline, and Sulfonamide derivatives   History: Past Medical History:  Diagnosis Date   Actinic keratosis    Allergy    Anxiety    Asthma    Concussion    with fall at home 10/2016   Depression    GERD (gastroesophageal reflux disease)    Glaucoma    Hyperlipidemia    Hypertension    IBS (irritable bowel syndrome)    OA (osteoarthritis) of knee    injections   Osteopenia    DXA Dec 28, 2010   Palpitations    Paroxysmal supraventricular tachycardia (HCC) May of 2005   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 Minor luminal irregularities with no evidence of obstructive disease.   cataract surgery     GALLBLADDER SURGERY  1997   RECTOCELE REPAIR  December 29, 2003   TOTAL ABDOMINAL HYSTERECTOMY     Family History  Problem Relation Age of Onset   Hypertension Mother    Breast cancer Mother 37   Hypertension Father    Aneurysm Father        AAA   Cancer Sister        colon   Colon cancer Sister    Cancer Daughter        thyroid   Anxiety disorder Daughter    Breast cancer Daughter 53   Breast cancer Daughter    Social History   Socioeconomic History   Marital status: Widowed    Spouse name: Not on file   Number of children: Not on file   Years of education: Not on file   Highest education level: Not on file  Occupational History   Occupation:  Retired    Associate Professor: retired    Comment: Homemaker  Tobacco Use   Smoking status: Never   Smokeless tobacco: Never  Vaping Use   Vaping status: Never Used  Substance and Sexual Activity   Alcohol use: No    Alcohol/week: 0.0 standard drinks of alcohol   Drug use: No   Sexual activity: Not Currently    Birth control/protection: Post-menopausal  Other Topics Concern   Not on file  Social History Narrative   Regular exercise- no    Moved here from [place]- to be closer to family    Husband died of lung CA 2013-12-28   Social Drivers of Corporate investment banker Strain:  Low Risk  (11/28/2023)   Overall Financial Resource Strain (CARDIA)    Difficulty of Paying Living Expenses: Not hard at all  Food Insecurity: No Food Insecurity (11/28/2023)   Hunger Vital Sign    Worried About Running Out of Food in the Last Year: Never true    Ran Out of Food in the Last Year: Never true  Transportation Needs: No Transportation Needs (11/28/2023)   PRAPARE - Administrator, Civil Service (Medical): No    Lack of Transportation (Non-Medical): No  Physical Activity: Inactive (11/28/2023)   Exercise Vital Sign    Days of Exercise per Week: 0 days    Minutes of Exercise per Session: 0 min  Stress: No Stress Concern Present (11/28/2023)   [employer] of Occupational Health - Occupational Stress Questionnaire    Feeling of Stress : Not at all  Social Connections: Socially Isolated (11/28/2023)   Social Connection and Isolation Panel [NHANES]    Frequency of Communication with Friends and Family: More than three times a week    Frequency of Social Gatherings with Friends and Family: Twice a week    Attends Religious Services: Never    Database administrator or Organizations: No    Attends Banker Meetings: Never    Marital Status: Widowed    Tobacco Counseling Counseling given: Not Answered  Clinical Intake:  Pre-visit preparation completed: Yes  Pain : No/denies  pain   BMI - recorded: 23.59 Nutritional Status: BMI of 19-24  Normal Nutritional Risks: None Diabetes: No  How often do you need to have someone help you when you read instructions, pamphlets, or other written materials from your doctor or pharmacy?: 1 - Never  Interpreter Needed?: No  Comments: daughter lives with pt Information entered by :: B.Artie Mcintyre,LPN   Activities of Daily Living    11/28/2023    3:02 PM 12/14/2022   11:44 AM  In your present state of health, do you have any difficulty performing the following activities:  Hearing? 0 1  Comment  Difficulty hearing in R ear  Vision? 0 0  Difficulty concentrating or making decisions? 0 0  Comment  Age appropriate  Walking or climbing stairs? 1 1  Dressing or bathing? 1 1  Comment  Granddaughter assist  Doing errands, shopping? 1 1  Comment  Daughter assist as needed  Preparing Food and eating ? N Y  Comment  Daughter assist  Using the Toilet? N N  In the past six months, have you accidently leaked urine? Y N  Do you have problems with loss of bowel control? N N  Managing your Medications? Malvin Johns  Comment  Daughter assist as needed  Managing your Finances? Malvin Johns  Comment  Daughter assist as needed  Housekeeping or managing your Housekeeping? Malvin Johns  Comment  Daughter assist    Patient Care Team: Joaquim Nam, MD as PCP - General (Family Medicine) Iran Ouch, MD as PCP - Cardiology (Cardiology) Kathyrn Sheriff, [facility] (Inactive) as Pharmacist (Pharmacist) Otho Ket, RN as [facility], [facility] (Optometry)  Indicate any recent Medical Services you may have received from other than [facility] providers in the past year (date may be approximate).     Assessment:   This is a routine wellness examination for [place].  Hearing/Vision screen Hearing Screening - Comments:: Pt says she can hear with aids Vision Screening - Comments:: Pt says good vision with  glasses [place]  [facility]   Goals Addressed   None    Depression Screen    11/28/2023    2:59 PM 09/10/2023    3:17 PM 02/01/2023    2:04 PM 01/25/2023    4:08 PM 12/14/2022   11:41 AM 12/04/2021    1:23 PM 11/25/2020    3:59 PM  PHQ 2/9 Scores  PHQ - 2 Score 0 0 0 0 0 0 0  PHQ- 9 Score  0 0 6       Fall Risk    11/28/2023    2:57 PM 09/10/2023    3:17 PM 02/01/2023    2:04 PM 01/25/2023    4:08 PM 01/25/2023    3:49 PM  Fall Risk   Falls in the past year? 0 0 1 0 0  Number falls in past yr: 0 0 1 0 0  Injury with Fall? 0 0 0 0 0  Risk for fall due to : No Fall Risks No Fall Risks History of fall(s) No Fall Risks No Fall Risks  Follow up Education provided;Falls prevention discussed Falls evaluation completed Falls evaluation completed Falls evaluation completed Falls evaluation completed    MEDICARE RISK AT HOME: Medicare Risk at Home Any stairs in or around the home?: Yes (has ramp) If so, are there any without handrails?: Yes Home free of loose throw rugs in walkways, pet beds, electrical cords, etc?: Yes Adequate lighting in your home to reduce risk of falls?: Yes Life alert?: No Use of a cane, walker or w/c?: Yes (walker) Grab bars in the bathroom?: Yes Shower chair or bench in shower?: Yes Elevated toilet seat or a handicapped toilet?: Yes  TIMED UP AND GO:  Was the test performed?  No    Cognitive Function:    07/30/2018    5:04 PM 04/23/2017    2:03 PM  MMSE - Mini Mental State Exam  Orientation to time 5 5  Orientation to Place 5 5  Registration 3 3  Attention/ Calculation 0 0  Recall 3 3  Language- name 2 objects 0 0  Language- repeat 1 1  Language- follow 3 step command 3 3  Language- read & follow direction 0 0  Write a sentence 0 0  Copy design 0 0  Total score 20 20        11/28/2023    3:04 PM 12/14/2022   12:20 PM  6CIT Screen  What Year? 0 points 0 points  What month? 0 points 0 points  What time? 3 points 0 points  Count back from 20 0  points   Months in reverse 4 points 0 points  Repeat phrase 10 points   Total Score 17 points     Immunizations Immunization History  Administered Date(s) Administered   Fluad Quad(high Dose 65+) 09/15/2019, 11/25/2020, 11/19/2022   Influenza Split 11/18/2012   Influenza Whole 10/03/2009, 09/29/2010   Influenza, High Dose Seasonal PF 10/26/2023   Influenza,inj,Quad PF,6+ Mos 09/18/2013, 10/08/2014, 10/22/2016, 11/21/2017, 10/06/2018   Influenza-Unspecified 10/21/2015, 10/06/2021   PFIZER(Purple Top)SARS-COV-2 Vaccination 02/11/2020, 03/09/2020, 10/16/2020, 10/06/2021   PNEUMOCOCCAL CONJUGATE-20 06/08/2021   Pneumococcal Conjugate-13 07/01/2015   Pneumococcal Polysaccharide-23 12/17/2004   Td 05/02/2012, 06/07/2023   Unspecified SARS-COV-2 Vaccination 10/26/2023    TDAP status: Up to date  Flu Vaccine status: Up to date  Pneumococcal vaccine status: Up to date  Covid-19 vaccine status: Completed vaccines  Qualifies for Shingles Vaccine? Yes   Zostavax completed No   Shingrix Completed?: No.    Education  has been provided regarding the importance of this vaccine. Patient has been advised to call insurance company to determine out of pocket expense if they have not yet received this vaccine. Advised may also receive vaccine at local pharmacy or Health Dept. Verbalized acceptance and understanding.  Screening Tests Health Maintenance  Topic Date Due   Zoster Vaccines- Shingrix (1 of 2) 02/26/2024 (Originally 11/28/1953)   MAMMOGRAM  11/27/2024 (Originally 09/26/2021)   Medicare Annual Wellness (AWV)  11/27/2024   DTaP/Tdap/Td (3 - Tdap) 06/06/2033   Pneumonia Vaccine 70+ Years old  Completed   INFLUENZA VACCINE  Completed   DEXA SCAN  Completed   HPV VACCINES  Aged Out   COVID-19 Vaccine  Discontinued    Health Maintenance  There are no preventive care reminders to display for this patient.   Colorectal cancer screening: No longer required.   Mammogram status: No  longer required due to age.   Lung Cancer Screening: (Low Dose CT Chest recommended if Age 34-80 years, 20 pack-year currently smoking OR have quit w/in 15years.) does not qualify.   Lung Cancer Screening Referral: no  Additional Screening:  Hepatitis C Screening: does not qualify; Completed no  Vision Screening: Recommended annual ophthalmology exams for early detection of glaucoma and other disorders of the eye. Is the patient up to date with their annual eye exam?  Yes  Who is the provider or what is the name of the office in which the patient attends annual eye exams? [facility] If pt is not established with a provider, would they like to be referred to a provider to establish care? No .   Dental Screening: Recommended annual dental exams for proper oral hygiene  Diabetic Foot Exam: n/a  Community Resource Referral / Chronic Care Management: CRR required this visit?  No   CCM required this visit?  No    Plan:     I have personally reviewed and noted the following in the patient's chart:   Medical and social history Use of alcohol, tobacco or illicit drugs  Current medications and supplements including opioid prescriptions. Patient is currently taking opioid prescriptions. Information provided to patient regarding non-opioid alternatives. Patient advised to discuss non-opioid treatment plan with their provider. Functional ability and status Nutritional status Physical activity Advanced directives List of other physicians Hospitalizations, surgeries, and ER visits in previous 12 months Vitals Screenings to include cognitive, depression, and falls Referrals and appointments  In addition, I have reviewed and discussed with patient certain preventive protocols, quality metrics, and best practice recommendations. A written personalized care plan for preventive services as well as general preventive health recommendations were provided to patient.    Sue Lush,  LPN   [id number]   After Visit Summary: (MyChart) Due to this being a telephonic visit, the after visit summary with patients personalized plan was offered to patient via MyChart   Nurse Notes: The patient states she is doing well and has no concerns or questions at this time.

## 2024-01-03 ENCOUNTER — Encounter: Payer: Self-pay | Admitting: Family Medicine

## 2024-01-05 ENCOUNTER — Other Ambulatory Visit: Payer: Self-pay | Admitting: Family Medicine

## 2024-01-08 ENCOUNTER — Other Ambulatory Visit: Payer: Self-pay | Admitting: Family Medicine

## 2024-01-08 MED ORDER — EZETIMIBE 10 MG PO TABS: 10.0000 mg | ORAL_TABLET | Freq: Every day | ORAL | 3 refills | Status: DC

## 2024-01-08 MED ORDER — MIRTAZAPINE 7.5 MG PO TABS: 7.5000 mg | ORAL_TABLET | Freq: Every day | ORAL | 0 refills | Status: DC

## 2024-01-08 MED ORDER — MIRTAZAPINE 7.5 MG PO TABS: 7.5000 mg | ORAL_TABLET | Freq: Every day | ORAL | 3 refills | Status: DC

## 2024-01-08 MED ORDER — EZETIMIBE 10 MG PO TABS: 10.0000 mg | ORAL_TABLET | Freq: Every day | ORAL | 0 refills | Status: DC

## 2024-02-13 ENCOUNTER — Ambulatory Visit: Payer: Medicare (Managed Care) | Admitting: Family Medicine

## 2024-02-18 ENCOUNTER — Ambulatory Visit: Payer: Medicare (Managed Care) | Admitting: Family Medicine

## 2024-02-21 ENCOUNTER — Encounter: Payer: Self-pay | Admitting: Family Medicine

## 2024-02-21 ENCOUNTER — Ambulatory Visit (INDEPENDENT_AMBULATORY_CARE_PROVIDER_SITE_OTHER): Payer: Medicare (Managed Care) | Admitting: Family Medicine

## 2024-02-21 VITALS — BP 132/78 | HR 64 | Temp 97.6°F | Ht <= 58 in | Wt 109.4 lb

## 2024-02-21 DIAGNOSIS — G8929 Other chronic pain: Secondary | ICD-10-CM

## 2024-02-21 DIAGNOSIS — R202 Paresthesia of skin: Secondary | ICD-10-CM

## 2024-02-21 DIAGNOSIS — E039 Hypothyroidism, unspecified: Secondary | ICD-10-CM | POA: Diagnosis not present

## 2024-02-21 DIAGNOSIS — J069 Acute upper respiratory infection, unspecified: Secondary | ICD-10-CM | POA: Diagnosis not present

## 2024-02-21 DIAGNOSIS — Z79891 Long term (current) use of opiate analgesic: Secondary | ICD-10-CM | POA: Diagnosis not present

## 2024-02-21 MED ORDER — HYDROCODONE-ACETAMINOPHEN 7.5-325 MG PO TABS: 1.0000 | ORAL_TABLET | Freq: Two times a day (BID) | ORAL | 0 refills | Status: DC | PRN

## 2024-02-21 MED ORDER — AMOXICILLIN 400 MG/5ML PO SUSR: 800.0000 mg | Freq: Two times a day (BID) | ORAL | 0 refills | Status: DC

## 2024-02-21 NOTE — Patient Instructions (Addendum)
 If needed, it would be okay to take a small amount of pudding with the thyroid medicine.  Go to the lab on the way out.   If you have mychart we'll likely use that to update you.    Let me know if you don't get better with the liquid amoxicillin.  Take care.  Glad to see you.

## 2024-02-21 NOTE — Progress Notes (Signed)
 HA in the evening.  Runny nose, stuffy.  No ear pain.  Facial pain, maxillary.    She had baseline dec in appetite.   Weight still 109 lbs from last OV.  Prev was 101 lbs.  D/w pt about protein intake.    L chest wall pain.  Worse at night, where she had shingles.  Still hypersensitive locally.  Still on the L side, dermatomal.  Some relief with med w/o ADE on med.  She is taking hydrocodone BID usually.  Prev was taking TID.  She was able to wean down some.  She had prev injection w/o relief.   UDS pending. She needs new rx sent since she changed pharmacy/insurance.    She had been off her thyroid medication in the meantime. D/w pt about trying to take med with small amount of pudding if needed.    She has baseline vocal cord dysfunction with subsequent voice changes.    She has positional L shin numbness upon waking in the AM, resolves as the day goes on.  Her mattress is reportedly worn out.  She is going to check on a new mattress.    Meds, vitals, and allergies reviewed.   ROS: Per HPI unless specifically indicated in ROS section   Nad Ncat Neck supple, no LA Max sinuses with reported pressure but not ttp.   Voice at baseline Rrr Ctab Abd soft, not ttp Normal B plantar and dorsiflexion.  intact sensation grossly on BLE

## 2024-02-22 ENCOUNTER — Encounter: Payer: Self-pay | Admitting: Family Medicine

## 2024-02-22 ENCOUNTER — Encounter: Payer: Self-pay | Admitting: Cardiovascular Disease

## 2024-02-23 ENCOUNTER — Encounter: Payer: Self-pay | Admitting: Family Medicine

## 2024-02-23 LAB — DRUG MONITORING, PANEL 8 WITH CONFIRMATION, URINE
6 Acetylmorphine: NEGATIVE ng/mL (ref <10)
Alcohol Metabolites: NEGATIVE ng/mL (ref <500)
Amphetamines: NEGATIVE ng/mL (ref <500)
Benzodiazepines: NEGATIVE ng/mL (ref <100)
Buprenorphine, Urine: NEGATIVE ng/mL (ref <5)
Cocaine Metabolite: NEGATIVE ng/mL (ref <150)
Codeine: NEGATIVE ng/mL (ref <50)
Creatinine: 51.2 mg/dL (ref >=20.0)
Hydrocodone: 561 ng/mL — ABNORMAL HIGH (ref <50)
Hydromorphone: 219 ng/mL — ABNORMAL HIGH (ref <50)
MDMA: NEGATIVE ng/mL (ref <500)
Marijuana Metabolite: NEGATIVE ng/mL (ref <20)
Morphine: NEGATIVE ng/mL (ref <50)
Norhydrocodone: 650 ng/mL — ABNORMAL HIGH (ref <50)
Opiates: POSITIVE ng/mL — AB (ref <100)
Oxidant: NEGATIVE ug/mL (ref <200)
Oxycodone: NEGATIVE ng/mL (ref <100)
pH: 7.3 (ref 4.5–9.0)

## 2024-02-23 LAB — DM TEMPLATE

## 2024-02-23 NOTE — Assessment & Plan Note (Signed)
 She has positional L shin numbness upon waking in the AM, resolves as the day goes on.  Her mattress is reportedly worn out.  She is going to check on a new mattress.   Normal motor function and sensation on exam.

## 2024-02-23 NOTE — Assessment & Plan Note (Signed)
 Given her hx, start amoxil.  Update me as needed.

## 2024-02-23 NOTE — Assessment & Plan Note (Signed)
 She had been off her thyroid medication in the meantime. D/w pt about trying to take med with small amount of pudding if needed.

## 2024-02-23 NOTE — Assessment & Plan Note (Signed)
 She had prev injection w/o relief.   UDS pending. She needs new rx sent since she changed pharmacy/insurance.  Continue hydrocodone BID as is, with occ TID dose if needed.

## 2024-02-24 ENCOUNTER — Other Ambulatory Visit: Payer: Self-pay

## 2024-02-24 DIAGNOSIS — I480 Paroxysmal atrial fibrillation: Secondary | ICD-10-CM

## 2024-02-24 MED ORDER — LOSARTAN POTASSIUM 25 MG PO TABS: 25.0000 mg | ORAL_TABLET | Freq: Every day | ORAL | 3 refills | Status: AC

## 2024-02-24 MED ORDER — APIXABAN 2.5 MG PO TABS: 2.5000 mg | ORAL_TABLET | Freq: Two times a day (BID) | ORAL | 1 refills | Status: DC

## 2024-02-24 NOTE — Telephone Encounter (Signed)
 Prescription refill request for Eliquis received. Indication:afib Last office visit:10/24 Scr:1.45  11/24 Age: 89 Weight:49.6  kg  Prescription refilled

## 2024-02-24 NOTE — Telephone Encounter (Signed)
 Good morning,  Patient is stating that she is needing an prior authorization for the HYDROcodone-acetaminophen (NORCO) 7.5-325 MG tablet  can someone check on this for me please.

## 2024-03-03 ENCOUNTER — Telehealth: Payer: Self-pay

## 2024-03-03 ENCOUNTER — Other Ambulatory Visit (HOSPITAL_COMMUNITY): Payer: Self-pay

## 2024-03-03 NOTE — Telephone Encounter (Signed)
 Pharmacy Patient Advocate Encounter   Received notification from Patient Advice Request messages that prior authorization for HYDROcodone-Acetaminophen 7.5-325MG  tablets is required/requested.   Insurance verification completed.   The patient is insured through [employer] .   Per test claim: PA required and submitted KEY/EOC/Request #: [id number] APPROVED from 03/03/24 to 03/03/25. Ran test claim, Copay is $24.48. This test claim was processed through [facility]- copay amounts may vary at other pharmacies due to pharmacy/plan contracts, or as the patient moves through the different stages of their insurance plan.

## 2024-03-03 NOTE — Telephone Encounter (Signed)
 My chart message sent to let patient know. No further action needed at this time.

## 2024-03-30 DIAGNOSIS — Z961 Presence of intraocular lens: Secondary | ICD-10-CM | POA: Diagnosis not present

## 2024-03-30 DIAGNOSIS — H35371 Puckering of macula, right eye: Secondary | ICD-10-CM | POA: Diagnosis not present

## 2024-03-30 DIAGNOSIS — H0100A Unspecified blepharitis right eye, upper and lower eyelids: Secondary | ICD-10-CM | POA: Diagnosis not present

## 2024-03-30 DIAGNOSIS — H353131 Nonexudative age-related macular degeneration, bilateral, early dry stage: Secondary | ICD-10-CM | POA: Diagnosis not present

## 2024-04-08 ENCOUNTER — Encounter: Payer: Self-pay | Admitting: Family Medicine

## 2024-04-11 ENCOUNTER — Encounter: Payer: Self-pay | Admitting: Family Medicine

## 2024-04-14 ENCOUNTER — Other Ambulatory Visit: Payer: Self-pay | Admitting: Family Medicine

## 2024-04-14 NOTE — Telephone Encounter (Signed)
 Copied from CRM [id number]. Topic: Clinical - Medication Refill >> Apr 14, 2024  2:15 PM Chuck Crater wrote: Most Recent Primary Care Visit:  Provider: Donnie Galea  Department: [facility]  Visit Type: ACUTE  Date: 02/21/2024  Medication: mupirocin  ointment (BACTROBAN ) 2 %  Has the patient contacted their pharmacy? Yes (Agent: If no, request that the patient contact the pharmacy for the refill. If patient does not wish to contact the pharmacy document the reason why and proceed with request.) (Agent: If yes, when and what did the pharmacy advise?) no refills  Is this the correct pharmacy for this prescription? Yes If no, delete pharmacy and type the correct one.  This is the patient's preferred pharmacy:  [facility] [address], [place] - [address] [place] [address] Phone: [phone] Fax: [phone]   Has the prescription been filled recently? No  Is the patient out of the medication? Yes  Has the patient been seen for an appointment in the last year OR does the patient have an upcoming appointment? Yes  Can we respond through MyChart? Yes  Agent: Please be advised that Rx refills may take up to 3 business days. We ask that you follow-up with your pharmacy.

## 2024-04-15 MED ORDER — MUPIROCIN 2 % EX OINT: TOPICAL_OINTMENT | Freq: Every day | CUTANEOUS | 1 refills | Status: AC

## 2024-05-04 ENCOUNTER — Encounter: Payer: Self-pay | Admitting: Cardiovascular Disease

## 2024-05-04 ENCOUNTER — Encounter: Payer: Self-pay | Admitting: Family Medicine

## 2024-05-04 MED ORDER — AMIODARONE HCL 100 MG PO TABS: 100.0000 mg | ORAL_TABLET | Freq: Every day | ORAL | 0 refills | Status: DC

## 2024-05-04 NOTE — Telephone Encounter (Signed)
Pt scheduled on 6/5

## 2024-05-07 ENCOUNTER — Other Ambulatory Visit: Payer: Self-pay

## 2024-05-07 MED ORDER — AMIODARONE HCL 100 MG PO TABS: 100.0000 mg | ORAL_TABLET | Freq: Every day | ORAL | 3 refills | Status: DC

## 2024-05-21 ENCOUNTER — Ambulatory Visit: Payer: Medicare (Managed Care) | Admitting: Medical

## 2024-06-05 ENCOUNTER — Encounter: Payer: Self-pay | Admitting: Medical

## 2024-06-05 ENCOUNTER — Ambulatory Visit: Payer: Medicare (Managed Care) | Attending: Medical | Admitting: Physician Assistant

## 2024-06-05 VITALS — BP 145/71 | HR 61 | Ht <= 58 in | Wt 110.4 lb

## 2024-06-05 DIAGNOSIS — I493 Ventricular premature depolarization: Secondary | ICD-10-CM | POA: Diagnosis not present

## 2024-06-05 DIAGNOSIS — R600 Localized edema: Secondary | ICD-10-CM

## 2024-06-05 DIAGNOSIS — I480 Paroxysmal atrial fibrillation: Secondary | ICD-10-CM

## 2024-06-05 DIAGNOSIS — I10 Essential (primary) hypertension: Secondary | ICD-10-CM | POA: Diagnosis not present

## 2024-06-05 NOTE — Progress Notes (Signed)
 Cardiology Office Note    Date:  06/05/2024   ID:  Jillian Oconnell, DOB 13-Apr-1934, MRN [id number]  PCP:  Donnie Galea, MD  Cardiologist:  Antionette Kirks, MD  Electrophysiologist:  None   Chief Complaint: Follow up  History of Present Illness:   Jillian Oconnell is a 89 y.o. female with history of frequent PVCs, nonobstructive CAD, and atrial fibrillation who presents for follow up on PVCs and atrial fibrillation.    Patient had prior cardiac cath 01/2008 which showed minor luminal irregularities with no evidence of obstructive disease.  EF at that time was normal.  Repeat echo 2017 showed normal EF without significant valvular disease.  Patient underwent cardiac monitoring 09/2016 which showed frequent PVCs with a total of 34,000 beats representing a 25% burden.  PVCs have been well-controlled with Toprol .  Repeat echo done in 2021 was not diagnostic due to poor visualization.  Patient was admitted 05/2022 for slurred speech found to have acute CVA.  She was also treated for UTI.  ZIO was placed and discharge and showed normal rhythm with 41 episodes of SVT the longest 12.9 seconds with rare PACs and PVCs.  She was also started on aspirin  and Plavix .  Seen in follow-up 07/2022 and was found to be in atrial fibrillation with heart rate of 124 bpm.  She was asymptomatic to this.  She was started on Eliquis  12.5 mg twice daily for CHA2DS2-VASc of at least 6.  She was continued on Toprol . Seen again in follow-up 08/2022 and remained in atrial fibrillation with rate 114 bpm.  She deferred TEE/DCCV.  She was started on amiodarone  load and subsequent daily dose.  Lower extremity edema was present and patient was given Lasix  20 mg to use as needed.  She was seen 09/2022 and found to be in sinus rhythm on amiodarone .  Amlodipine  was discontinued due to persistent lower extremity edema.  Seen again in follow-up 10/2022 and overall doing well.  Blood pressure was moderately elevated and she was started on  losartan  25 mg daily.  She was seen in follow-up 01/2023 and amiodarone  was decreased to 100 mg daily.  Her dose of losartan  was decreased in the setting of hyperkalemia.   Patient was most recently seen by Dr. Alvenia Aus in the office 09/2023 and overall doing well from a cardiac perspective.  She was noted to have a recent BNP of 400.  However, there were no clear signs of heart failure.  She was continued on furosemide  20 mg daily as needed.  Patient reports to clinic today doing well from a cardiac perspective.  She lives with her daughter who is present for visit today and assists with history.  Patient has ongoing vocal strain which sometimes causes shortness of breath.  She was recommended to take Lasix  daily by her PCP to see if this improves her shortness of breath.  However, her shortness of breath was unchanged and she returned to taking Lasix  as needed.  She reports occasional lower extremity swelling for which she takes Lasix .  She otherwise denies chest pain, lightheadedness, dizziness, palpitations, and orthopnea.  She denies bleeding and hematochezia.  No recent falls.  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   apixaban  (ELIQUIS ) 2.5 MG TABS tablet Take 1 tablet (2.5 mg total) by mouth 2 (two) times daily.   Calcium  Carbonate-Vitamin D  600-400 MG-UNIT tablet Take 1 tablet by mouth 2 (two) times daily. Take 1 tablet by mouth two times a day   ezetimibe  (ZETIA ) 10 MG tablet Take 1 tablet (10 mg total) by mouth daily.   fluticasone  (FLONASE ) 50 MCG/ACT nasal spray Place 2 sprays into both nostrils daily.   Fluticasone  Furoate (ARNUITY ELLIPTA ) 200 MCG/ACT AEPB Take 1 Inhalation by mouth daily. Rinse after use.   furosemide  (LASIX ) 20 MG tablet Take 1 tablet (20 mg total) by mouth daily as needed for fluid.   HYDROcodone -acetaminophen  (NORCO) 7.5-325 MG tablet Take 1 tablet by mouth 2 (two) times daily as needed (with 3rd dose per day if needed. for pain.).   HYDROcodone -acetaminophen  (NORCO) 7.5-325 MG tablet Take 1 tablet by mouth 2 (two) times daily as needed (with 3rd dose per day if needed.  for pain.). Fill on/after 60 days from rx   HYDROcodone -acetaminophen  (NORCO) 7.5-325 MG tablet Take 1 tablet by mouth 2 (two) times daily as needed (with 3rd dose per day if needed. for pain.). Fill on/after 30 days from rx   lansoprazole  (PREVACID ) 15 MG capsule Take 1 capsule (15 mg total) by mouth 2 (two) times daily before a meal.   losartan  (COZAAR ) 25 MG tablet Take 1 tablet (25 mg total) by mouth daily. To replacement prev rx   metoprolol  succinate (TOPROL -XL) 100 MG 24 hr tablet TAKE 1 TABLET TWICE DAILY WITH OR IMMEDIATELY FOLLOWING A MEAL   mirtazapine  (REMERON ) 7.5 MG tablet Take 1 tablet (7.5 mg total) by mouth at bedtime.   montelukast  (SINGULAIR ) 10 MG tablet TAKE 1 TABLET AT BEDTIME   mupirocin  ointment (BACTROBAN ) 2 % Apply topically daily.   ondansetron  (ZOFRAN ) 4 MG tablet Take 1 tablet (4 mg total) by mouth every 8 (eight) hours as needed for nausea or vomiting.     Allergies:   Alendronate sodium, Amoxicillin -pot clavulanate, Atorvastatin, Azithromycin , Ezetimibe -simvastatin, Gabapentin, Ibandronate sodium, Lexapro  [escitalopram  oxalate], Lyrica  [pregabalin ], Neosporin [bacitracin-polymyxin b], Risedronate sodium, Rosuvastatin, Sertraline , and Sulfonamide derivatives   Social History   Socioeconomic History   Marital status: Widowed    Spouse name: Not on file   Number of children: Not on file   Years of education: Not on file   Highest education level: Not on file  Occupational History   Occupation: Retired    Associate Professor: retired    Comment: Homemaker  Tobacco Use   Smoking status: Never   Smokeless tobacco: Never  Vaping Use   Vaping status: Never Used  Substance and Sexual Activity  Alcohol use: No    Alcohol/week: 0.0 standard drinks of alcohol   Drug use: No   Sexual activity: Not Currently    Birth control/protection: Post-menopausal  Other Topics Concern   Not on file  Social History Narrative   Regular exercise- no    Moved here from [place]- to be closer to family    Husband died of lung CA June 17, 2013   Social Drivers of Corporate investment banker Strain: Low Risk  (11/28/2023)   Overall Financial Resource Strain (CARDIA)    Difficulty of Paying Living Expenses: Not hard at all  Food Insecurity: No Food Insecurity (11/28/2023)   Hunger Vital Sign    Worried About Running Out of Food in the Last Year: Never true    Ran Out of Food in the Last Year: Never true  Transportation Needs: No Transportation Needs (11/28/2023)   PRAPARE - Administrator, Civil Service (Medical): No    Lack of Transportation (Non-Medical): No  Physical Activity: Inactive (11/28/2023)   Exercise Vital Sign    Days of Exercise per Week: 0 days    Minutes of Exercise per Session: 0 min  Stress: No Stress Concern Present (11/28/2023)   [employer] of Occupational Health - Occupational Stress Questionnaire    Feeling of Stress : Not at  all  Social Connections: Socially Isolated (11/28/2023)   Social Connection and Isolation Panel    Frequency of Communication with Friends and Family: More than three times a week    Frequency of Social Gatherings with Friends and Family: Twice a week    Attends Religious Services: Never    Database administrator or Organizations: No    Attends Banker Meetings: Never    Marital Status: Widowed     Family History:  [name] family history includes Aneurysm in her father; Anxiety disorder in her daughter; Breast cancer in her daughter; Breast cancer (age of onset: 8) in her daughter; Breast cancer (age of onset: 89) in her mother; Cancer in her daughter and sister; Colon cancer in her sister; Hypertension in her father and mother.  ROS:   12-point review of systems is negative unless otherwise noted in the HPI.   EKGs/Other Studies Reviewed:    EKG:  EKG personally reviewed by me today EKG Interpretation Date/Time:  Friday June 05 2024 08:45:15 EDT Ventricular Rate:  61 PR Interval:  168 QRS Duration:  78 QT Interval:  444 QTC Calculation: 446 R Axis:   35  Text Interpretation: Normal sinus rhythm Poor R wave progression Confirmed by Gildardo Labrador ([id number]) on 06/05/2024 8:48:05 AM  PHYSICAL EXAM:    VS:  BP (!) 145/71   Pulse 61   Ht 4' 9 (1.448 m)   Wt 110 lb 6.4 oz (50.1 kg)   SpO2 97%   BMI 23.89 kg/m   BMI: Body mass index is 23.89 kg/m.  Physical Exam Vitals and nursing note reviewed.  Constitutional:      General: She is not in acute distress.    Appearance: Normal appearance.   Cardiovascular:     Rate and Rhythm: Normal rate and regular rhythm.     Heart sounds: No murmur heard. Pulmonary:     Effort: Pulmonary effort is normal. No respiratory distress.     Breath sounds: No wheezing or rales.   Musculoskeletal:     Right lower leg: No edema.     Left lower leg: No edema.   Skin:  General: Skin is warm and dry.   Neurological:      General: No focal deficit present.     Mental Status: She is alert and oriented to person, place, and time. Mental status is at baseline.   Psychiatric:        Mood and Affect: Mood normal.        Behavior: Behavior normal.    Wt Readings from Last 3 Encounters:  06/05/24 110 lb 6.4 oz (50.1 kg)  02/21/24 109 lb 6.4 oz (49.6 kg)  11/28/23 109 lb (49.4 kg)       ASSESSMENT & PLAN:   Paroxysmal atrial fibrillation - Maintaining sinus rhythm.  No symptoms concerning for recurrence.  Continue amiodarone  100 mg daily and Toprol  100 mg daily.  CHA2DS2-VASc at least 6.  Continue Eliquis  2.5 mg twice daily (reduced dose for age and weight).  Frequent PVCs - Quiescent on Toprol  and amiodarone .  Essential hypertension - Blood pressure is reasonably controlled given age.  Continue Toprol  as above and losartan  25 mg daily.  Lower extremity edema - No swelling on exam today.  Continue Lasix  20 mg daily as needed.   Disposition: F/u with Dr. Alvenia Aus or an APP in 6 months.   Medication Adjustments/Labs and Tests Ordered: Current medicines are reviewed at length with the patient today.  Concerns regarding medicines are outlined above. Medication changes, Labs and Tests ordered today are summarized above and listed in the Patient Instructions accessible in Encounters.   Beather Liming, PA-C 06/05/2024 9:31 AM     [facility] [address] [place], [place] [address] [phone]

## 2024-06-05 NOTE — Patient Instructions (Signed)
 Medication Instructions:  Your physician recommends that you continue on your current medications as directed. Please refer to the Current Medication list given to you today.    *If you need a refill on your cardiac medications before your next appointment, please call your pharmacy*  Lab Work: No labs ordered today    Testing/Procedures: No test ordered today   Follow-Up: At [facility], you and your health needs are our priority.  As part of our continuing mission to provide you with exceptional heart care, our providers are all part of one team.  This team includes your primary Cardiologist (physician) and Advanced Practice Providers or APPs (Physician Assistants and Nurse Practitioners) who all work together to provide you with the care you need, when you need it.  Your next appointment:   6 month(s)  Provider:   You may see Antionette Kirks, MD or one of the following Advanced Practice Providers on your designated Care Team:   Laneta Pintos, NP Gildardo Labrador, PA-C Varney Gentleman, PA-C Cadence Mesquite, PA-C Ronald Cockayne, NP Morey Ar, NP

## 2024-06-16 ENCOUNTER — Ambulatory Visit (INDEPENDENT_AMBULATORY_CARE_PROVIDER_SITE_OTHER): Payer: Medicare (Managed Care) | Admitting: Family Medicine

## 2024-06-16 ENCOUNTER — Encounter: Payer: Self-pay | Admitting: Family Medicine

## 2024-06-16 VITALS — BP 138/74 | HR 61 | Temp 97.8°F | Ht <= 58 in | Wt 111.6 lb

## 2024-06-16 DIAGNOSIS — E039 Hypothyroidism, unspecified: Secondary | ICD-10-CM | POA: Diagnosis not present

## 2024-06-16 DIAGNOSIS — Z7901 Long term (current) use of anticoagulants: Secondary | ICD-10-CM

## 2024-06-16 DIAGNOSIS — Z79891 Long term (current) use of opiate analgesic: Secondary | ICD-10-CM

## 2024-06-16 DIAGNOSIS — J3800 Paralysis of vocal cords and larynx, unspecified: Secondary | ICD-10-CM

## 2024-06-16 DIAGNOSIS — M542 Cervicalgia: Secondary | ICD-10-CM

## 2024-06-16 DIAGNOSIS — Z66 Do not resuscitate: Secondary | ICD-10-CM | POA: Insufficient documentation

## 2024-06-16 DIAGNOSIS — R490 Dysphonia: Secondary | ICD-10-CM

## 2024-06-16 MED ORDER — HYDROCODONE-ACETAMINOPHEN 7.5-325 MG PO TABS: 1.0000 | ORAL_TABLET | Freq: Two times a day (BID) | ORAL | 0 refills | Status: DC | PRN

## 2024-06-16 MED ORDER — DICLOFENAC SODIUM 1 % EX GEL: 2.0000 g | Freq: Four times a day (QID) | CUTANEOUS | 3 refills | Status: AC | PRN

## 2024-06-16 NOTE — Patient Instructions (Signed)
 I would try diclofenac gel and I'll work on home health PT.  Take care.  Glad to see you.

## 2024-06-16 NOTE — Progress Notes (Addendum)
 D/w pt about trying to get help on eliquis .  I need pharmacy input.  Best contact for patient is her daughter, [phone].     She had trouble taking levothyroxine  due to the pill sticking.  That was the main issue, not her other meds.  She stopped in the meantime.  Discussed that we could recheck her TSH with a neck set of labs.  Still dealing with shingles pain, still taking hydrocodone  at baseline.  Rx sent.  Not sedated.  She is on list total opiate dose than previous, in distant past.  No adverse effect on medication.  Neck clicking with rotation but not flex/ext.  She got a supportive pillow.  No fall, no trauma.  She has chronic postural changes.    Code status d/w pt.  She clearly affirms DNR.  Paperwork done at [facility].  She has voice changes at baseline with h/o vocal cord paresis.    Meds, vitals, and allergies reviewed.   ROS: Per HPI unless specifically indicated in ROS section   Nad Ncat She has voice changes at baseline.   Chronic postural changes with kyphosis and subsequent compensatory neck extension.  She does have some crepitus that is audible with neck range of motion, rotation. Neck is not stiff. No LA Rrr Ctab Abd soft, not ttp Skin well-perfused.

## 2024-06-17 DIAGNOSIS — M542 Cervicalgia: Secondary | ICD-10-CM | POA: Insufficient documentation

## 2024-06-17 NOTE — Assessment & Plan Note (Signed)
 Can use Voltaren gel.  Refer for home health PT.  Defer imaging as I do not expect plain films to change management.  She agrees.

## 2024-06-17 NOTE — Assessment & Plan Note (Signed)
 She clearly affirms DNR.  Paperwork done at [facility].  Yellow copy given to patient and daughter.

## 2024-06-17 NOTE — Assessment & Plan Note (Signed)
 Continue hydrocodone  as is for pain on the left thorax from previous shingles.

## 2024-06-29 ENCOUNTER — Encounter: Payer: Self-pay | Admitting: Cardiovascular Disease

## 2024-06-29 ENCOUNTER — Other Ambulatory Visit: Payer: Self-pay

## 2024-06-29 ENCOUNTER — Encounter: Payer: Self-pay | Admitting: Family Medicine

## 2024-06-29 MED ORDER — METOPROLOL SUCCINATE ER 100 MG PO TB24: ORAL_TABLET | ORAL | 1 refills | Status: DC

## 2024-07-03 ENCOUNTER — Telehealth: Payer: Self-pay

## 2024-07-03 NOTE — Telephone Encounter (Signed)
 Copied from CRM [id number]. Topic: Clinical - Medical Advice >> Jul 02, 2024  3:54 PM Mia F wrote: Reason for CRM: [facility] called in stating that they will not be admitting pt for home health services. If any questions please call [phone]

## 2024-07-03 NOTE — Telephone Encounter (Signed)
 Did they deny it or just not have coverage/staffing?

## 2024-07-03 NOTE — Progress Notes (Signed)
 Complex Care Management Note  Care Guide Note 07/03/2024 Name: Akeya Ryther Lick MRN: [id number] DOB: [birth year]  Jillian Oconnell is a 89 y.o. year old female who sees Cleatus Arlyss RAMAN, MD for primary care. I reached out to Jillian Oconnell by phone today to offer complex care management services.  Ms. Leverett was given information about Complex Care Management services today including:   The Complex Care Management services include support from the care team which includes your Nurse Care Manager, Clinical Social Worker, or Pharmacist.  The Complex Care Management team is here to help remove barriers to the health concerns and goals most important to you. Complex Care Management services are voluntary, and the patient may decline or stop services at any time by request to their care team member.   Complex Care Management Consent Status: Patient agreed to services and verbal consent obtained.   Follow up plan:  Telephone appointment with complex care management team member scheduled for:  07/09/24 at 2:30 p.m.  Encounter Outcome:  Patient Scheduled  Dreama [facility]  [facility], [facility] Health Care Management Assistant Direct Dial: [phone]  Fax: [phone]

## 2024-07-03 NOTE — Telephone Encounter (Signed)
 Attempted to reach out to [facility] unable to leave a voicemail

## 2024-07-06 NOTE — Telephone Encounter (Signed)
 Attempted to call [phone] to obtain additional information. Unable to leave a message. Called [phone] and was able to leave a message. Awaiting a return call

## 2024-07-06 NOTE — Telephone Encounter (Signed)
 Received return call from [place] with [facility]. She states that the therapist went out to admit patient. But the patient advised that the only problem she is having is with her neck. She demonstrated independence as well they reviewed fall safety. The patient agreed that she did not need home health.

## 2024-07-09 ENCOUNTER — Other Ambulatory Visit (INDEPENDENT_AMBULATORY_CARE_PROVIDER_SITE_OTHER): Payer: Medicare (Managed Care)

## 2024-07-09 DIAGNOSIS — Z8673 Personal history of transient ischemic attack (TIA), and cerebral infarction without residual deficits: Secondary | ICD-10-CM

## 2024-07-09 NOTE — Patient Instructions (Addendum)
 Ms. Jillian Oconnell,   It was a pleasure to speak with you today! As we discussed:?   Who may be eligible for Medicare Extra Help?  Your Household Size Annual income limit Resource Limit*  Individual [id number] $17,600  Married Couple [phone] $35,130   *Resources include:   -  Money in a checking, savings, or retirement account   -  Stocks   -  Bonds  Medicare Does NOT consider the following in your resource limit:   -  Your home   -  One car   -  Burial plot   -  Up to $1,500 for burial expenses if you have put that money aside   -  Physiological scientist   -  Other household and personal items    For those who are approved for [employer] Extra Help (aka LIS, Low Income Subsidy)   -  Plan premium: $0   -  Plan deductible: $0  Prescriptions you fill at one of your plan's participating pharmacies:   -  Up to $4.90 for each generic drug   -  Up to $12.15 for each brand-name drug  How to apply: Online: https://www.davila.com/ Then click Apply for Extra Help:      Back-up Plan  There is a program that may help with the cost of Eliquis , though they have a requirement for patients to spend 3% of their total yearly income on prescription medicines before the program will help with Eliquis . We estimated that ~$950/month in social security retirement would result in a 3% requirement of around $330.   We also discussed a similar medication called Xarelto which has a similar program but will usually waive this spending requirement for Medicare folks who make less than ~$50k yearly. Xarelto is equally as effective for preventing stroke as Eliquis , though Eliquis  across all of the available data tends to show slightly lower risk of bleeding compared to all other blood thinners, especially in older patients, which is why we typically gravitate to it first.    Please reach out prior to your next scheduled appointment should you have any questions or concerns.  You may  respond directly to this message, or leave me a voicemail at [phone] and I will get back to you shortly.   Thank you!   Future Appointments  Date Time Provider Department Center  11/30/2024  3:00 PM [facility] ANNUAL WELLNESS VISIT 1 [facility] PEC    [name], PharmD Clinical Pharmacist [facility] [phone]

## 2024-07-09 NOTE — Progress Notes (Signed)
   07/09/2024 Name: Jillian Oconnell MRN: [id number] DOB: 11/20/1934  Subjective  Chief Complaint  Patient presents with   Medication Access    Care Team: Primary Care Provider: Cleatus Arlyss RAMAN, MD  Reason for visit: ?  Jillian Oconnell is a 89 y.o. female who presents today for a telephone visit with the pharmacist due to medication access concerns regarding their Eliquis . ?   Medication Access: ?  Reports that all medications are not affordable. Is managing the $47 copay though is appreciative of any assistance that may be available.   Prescription drug coverage: YES Payor: Training and development officer / Plan: CIGNA MEDICARE ADVANTAGE / Product Type: *No Product type* / .  Summary of Benefit: Brand = $47/month   Current Patient Assistance: None  Patient lives in a household of 1 with an estimated combined monthly income of $950 via Product manager.  Medicare LIS Eligible: Likely eligible based on reported estimate of income/assets    Assessment and Plan:   1. Medication Access Medicare LIS: Suspect patient should be eligible for Medicare LIS Extra Help which will reduce drug cost significantly. Would bring her Brand cost down to ~$11 which they feel is reasonable Daughter's preference to complete via online application with her husband who helps to manage Jillian Oconnell finances. LIS application information sent via MyChart.    Back-up Plan:  Reviewed Eliquis  PAP eligibility. 3% OOP spend requirement ~$320 based on reported income estimate which has not been reached at this time.  Would likely be eligible for Xarelto PAP with waived OOP spend due to income <$50k though if able to stay on Eliquis , preferred for lower bleed risk. Will exhaust all Eliquis  options first prior to reaching out ot cardiology.   Future Appointments  Date Time Provider Department Center  11/30/2024  3:00 PM [facility] ANNUAL WELLNESS VISIT 1 [facility] P[facility]    [name], PharmD Clinical  Pharmacist [facility] [phone]

## 2024-07-12 NOTE — Telephone Encounter (Signed)
 Noted. Thanks.

## 2024-07-21 ENCOUNTER — Encounter: Payer: Self-pay | Admitting: Family Medicine

## 2024-07-21 ENCOUNTER — Other Ambulatory Visit: Payer: Self-pay

## 2024-07-21 MED ORDER — MONTELUKAST SODIUM 10 MG PO TABS: 10.0000 mg | ORAL_TABLET | Freq: Every day | ORAL | 1 refills | Status: AC

## 2024-08-03 ENCOUNTER — Telehealth: Payer: Self-pay | Admitting: Cardiovascular Disease

## 2024-08-03 MED ORDER — AMIODARONE HCL 100 MG PO TABS: 100.0000 mg | ORAL_TABLET | Freq: Every day | ORAL | 3 refills | Status: AC

## 2024-08-03 NOTE — Telephone Encounter (Signed)
*  STAT* If patient is at the pharmacy, call can be transferred to refill team.   1. Which medications need to be refilled? (please list name of each medication and dose if known) amiodarone  (PACERONE ) 100 MG tablet    2. Would you like to learn more about the convenience, safety, & potential cost savings by using the [facility]?    3. Are you open to using the [facility] (Type [facility].  ).   4. Which pharmacy/location (including street and city if local pharmacy) is medication to be sent to? [facility] [address] [place] - [address]    5. Do they need a 30 day or 90 day supply? 90 day

## 2024-08-22 ENCOUNTER — Other Ambulatory Visit: Payer: Self-pay | Admitting: Cardiovascular Disease

## 2024-08-22 DIAGNOSIS — I480 Paroxysmal atrial fibrillation: Secondary | ICD-10-CM

## 2024-08-24 NOTE — Telephone Encounter (Signed)
 Prescription refill request for Eliquis  received. Indication:afib Last office visit:6/25 Scr:1.45  11/24 Age: 89 Weight 50.6  kg  Prescription refilled

## 2024-08-26 ENCOUNTER — Encounter: Payer: Self-pay | Admitting: Family Medicine

## 2024-08-26 DIAGNOSIS — R490 Dysphonia: Secondary | ICD-10-CM

## 2024-08-26 DIAGNOSIS — J3800 Paralysis of vocal cords and larynx, unspecified: Secondary | ICD-10-CM

## 2024-09-01 ENCOUNTER — Encounter: Payer: Self-pay | Admitting: Pharmacist

## 2024-09-01 NOTE — Progress Notes (Signed)
 Reason: Medication Access  Summary: LIS application completed online with patient's daughter/caregiver.   Application Re-entry Number: [id number]     [name], PharmD Clinical Pharmacist [facility] [phone]

## 2024-09-02 NOTE — Addendum Note (Signed)
 Addended by: CLEATUS ARLYSS RAMAN on: 09/02/2024 07:13 PM   Modules accepted: Orders

## 2024-09-02 NOTE — Telephone Encounter (Signed)
 Please check with the referral department.  I put in home health speech therapy evaluation.  Please see if they can help patient with dysphonia.  Thanks.

## 2024-09-02 NOTE — Assessment & Plan Note (Signed)
 At baseline, see exam above.

## 2024-09-09 NOTE — Addendum Note (Signed)
 Addended by: CLEATUS ARLYSS RAMAN on: 09/09/2024 07:21 PM   Modules accepted: Orders

## 2024-09-10 DIAGNOSIS — J3800 Paralysis of vocal cords and larynx, unspecified: Secondary | ICD-10-CM | POA: Insufficient documentation

## 2024-09-10 NOTE — Assessment & Plan Note (Signed)
 She has voice changes at baseline with h/o vocal cord paresis.   She can let me know if she wants to see ST.

## 2024-09-11 DIAGNOSIS — Z556 Problems related to health literacy: Secondary | ICD-10-CM | POA: Diagnosis not present

## 2024-09-11 DIAGNOSIS — J383 Other diseases of vocal cords: Secondary | ICD-10-CM | POA: Diagnosis not present

## 2024-09-11 DIAGNOSIS — I10 Essential (primary) hypertension: Secondary | ICD-10-CM | POA: Diagnosis not present

## 2024-09-11 DIAGNOSIS — H9190 Unspecified hearing loss, unspecified ear: Secondary | ICD-10-CM | POA: Diagnosis not present

## 2024-09-11 DIAGNOSIS — Z7901 Long term (current) use of anticoagulants: Secondary | ICD-10-CM | POA: Diagnosis not present

## 2024-09-11 DIAGNOSIS — M810 Age-related osteoporosis without current pathological fracture: Secondary | ICD-10-CM | POA: Diagnosis not present

## 2024-09-11 DIAGNOSIS — F419 Anxiety disorder, unspecified: Secondary | ICD-10-CM | POA: Diagnosis not present

## 2024-09-11 DIAGNOSIS — Z8673 Personal history of transient ischemic attack (TIA), and cerebral infarction without residual deficits: Secondary | ICD-10-CM | POA: Diagnosis not present

## 2024-09-11 DIAGNOSIS — K219 Gastro-esophageal reflux disease without esophagitis: Secondary | ICD-10-CM | POA: Diagnosis not present

## 2024-09-11 DIAGNOSIS — E039 Hypothyroidism, unspecified: Secondary | ICD-10-CM | POA: Diagnosis not present

## 2024-09-11 DIAGNOSIS — J45909 Unspecified asthma, uncomplicated: Secondary | ICD-10-CM | POA: Diagnosis not present

## 2024-09-28 ENCOUNTER — Other Ambulatory Visit: Payer: Self-pay | Admitting: Family Medicine

## 2024-09-28 MED ORDER — HYDROCODONE-ACETAMINOPHEN 7.5-325 MG PO TABS: 1.0000 | ORAL_TABLET | Freq: Two times a day (BID) | ORAL | 0 refills | Status: DC | PRN

## 2024-09-28 NOTE — Telephone Encounter (Signed)
 LOV 06/16/24  Last refill 06/16/24 60 tabs w/ 0 refills  NOV Nothing

## 2024-09-28 NOTE — Telephone Encounter (Signed)
 Copied from CRM [id number]. Topic: Clinical - Medication Refill >> Sep 28, 2024  8:10 AM Aleatha C wrote: Medication: HYDROcodone -acetaminophen  (NORCO) 7.5-325 MG tablet  Has the patient contacted their pharmacy? Yes (Agent: If no, request that the patient contact the pharmacy for the refill. If patient does not wish to contact the pharmacy document the reason why and proceed with request.) (Agent: If yes, when and what did the pharmacy advise?)  This is the patient's preferred pharmacy:  [facility] [address], [place] - [address] [place] [address] Phone: [phone] Fax: [phone]  Is this the correct pharmacy for this prescription? Yes If no, delete pharmacy and type the correct one.   Has the prescription been filled recently? No  Is the patient out of the medication? Yes  Has the patient been seen for an appointment in the last year OR does the patient have an upcoming appointment? Yes  Can we respond through MyChart? No  Agent: Please be advised that Rx refills may take up to 3 business days. We ask that you follow-up with your pharmacy.

## 2024-10-09 NOTE — Telephone Encounter (Signed)
 Per Referral notes, patient started ST services with [facility] on 09/11/24  Nothing further needed.

## 2024-10-14 DIAGNOSIS — I10 Essential (primary) hypertension: Secondary | ICD-10-CM | POA: Diagnosis not present

## 2024-10-14 DIAGNOSIS — Z8673 Personal history of transient ischemic attack (TIA), and cerebral infarction without residual deficits: Secondary | ICD-10-CM | POA: Diagnosis not present

## 2024-10-14 DIAGNOSIS — J45909 Unspecified asthma, uncomplicated: Secondary | ICD-10-CM | POA: Diagnosis not present

## 2024-10-14 DIAGNOSIS — Z556 Problems related to health literacy: Secondary | ICD-10-CM | POA: Diagnosis not present

## 2024-10-14 DIAGNOSIS — K219 Gastro-esophageal reflux disease without esophagitis: Secondary | ICD-10-CM | POA: Diagnosis not present

## 2024-10-14 DIAGNOSIS — M810 Age-related osteoporosis without current pathological fracture: Secondary | ICD-10-CM | POA: Diagnosis not present

## 2024-10-14 DIAGNOSIS — H9190 Unspecified hearing loss, unspecified ear: Secondary | ICD-10-CM | POA: Diagnosis not present

## 2024-10-14 DIAGNOSIS — J383 Other diseases of vocal cords: Secondary | ICD-10-CM | POA: Diagnosis not present

## 2024-10-14 DIAGNOSIS — E039 Hypothyroidism, unspecified: Secondary | ICD-10-CM | POA: Diagnosis not present

## 2024-10-14 DIAGNOSIS — Z7901 Long term (current) use of anticoagulants: Secondary | ICD-10-CM | POA: Diagnosis not present

## 2024-10-14 DIAGNOSIS — F419 Anxiety disorder, unspecified: Secondary | ICD-10-CM | POA: Diagnosis not present

## 2024-10-15 ENCOUNTER — Ambulatory Visit: Payer: Medicare (Managed Care) | Admitting: Dermatology

## 2024-11-17 ENCOUNTER — Other Ambulatory Visit: Payer: Self-pay | Admitting: Family Medicine

## 2024-11-25 ENCOUNTER — Other Ambulatory Visit: Payer: Self-pay | Admitting: Family Medicine

## 2024-11-30 ENCOUNTER — Ambulatory Visit: Payer: Medicare HMO

## 2024-11-30 DIAGNOSIS — Z0000 Encounter for general adult medical examination without abnormal findings: Secondary | ICD-10-CM | POA: Diagnosis not present

## 2024-11-30 NOTE — Progress Notes (Signed)
 Chief Complaint  Patient presents with   Medicare Wellness     Subjective:   Jillian Oconnell is a 89 y.o. female who presents for a Medicare Annual Wellness Visit.  Visit info / Clinical Intake: Medicare Wellness Visit Type:: Subsequent Annual Wellness Visit Persons participating in visit and providing information:: patient & caregiver Maple Pouch -daughter) Medicare Wellness Visit Mode:: Telephone If telephone:: video declined Since this visit was completed virtually, some vitals may be partially provided or unavailable. Missing vitals are due to the limitations of the virtual format.: Unable to obtain vitals - no equipment If Telephone or Video please confirm:: I connected with patient using audio/video enable telemedicine. I verified patient identity with two identifiers, discussed telehealth limitations, and patient agreed to proceed. Patient Location:: Home Provider Location:: home Interpreter Needed?: No Pre-visit prep was completed: yes AWV questionnaire completed by patient prior to visit?: no Living arrangements:: with family/others Patient's Overall Health Status Rating: good Typical amount of pain: some Does pain affect daily life?: no Are you currently prescribed opioids?: (!) yes  Dietary Habits and Nutritional Risks How many meals a day?: other: (not good at eating-like pudding and peaches and bran cereal.) Eats fruit and vegetables daily?: (!) no Most meals are obtained by: having others provide food In the last 2 weeks, have you had any of the following?: none Diabetic:: no  Functional Status Activities of Daily Living (to include ambulation/medication): Independent Ambulation: Independent with device- listed below Home Assistive Devices/Equipment: Walker (specify Type) (wheels) Medication Administration: Independent Home Management (perform basic housework or laundry): Independent Manage your own finances?: (!) no Primary transportation is: family /  friends Concerns about vision?: no *vision screening is required for WTM* Concerns about hearing?: no Uses hearing aids?: (!) yes  Fall Screening Falls in the past year?: 0 Number of falls in past year: 0 Was there an injury with Fall?: 0 Fall Risk Category Calculator: 0 Patient Fall Risk Level: Low Fall Risk  Fall Risk Patient at Risk for Falls Due to: Impaired balance/gait; Impaired mobility Fall risk Follow up: Falls evaluation completed; Education provided; Falls prevention discussed  Home and Transportation Safety: All rugs have non-skid backing?: N/A, no rugs All stairs or steps have railings?: N/A, no stairs Grab bars in the bathtub or shower?: yes Have non-skid surface in bathtub or shower?: yes Good home lighting?: yes Regular seat belt use?: yes Hospital stays in the last year:: no  Cognitive Assessment Difficulty concentrating, remembering, or making decisions? : no Will 6CIT or Mini Cog be Completed: yes What year is it?: 0 points What month is it?: 0 points About what time is it?: 0 points Count backwards from 20 to 1: 0 points Say the months of the year in reverse: 0 points  Advance Directives (For Healthcare) Does Patient Have a Medical Advance Directive?: Yes Does patient want to make changes to medical advance directive?: No - Patient declined Type of Advance Directive: Out of facility DNR (pink MOST or yellow form) Out of facility DNR (pink MOST or yellow form) in Chart? (Ambulatory ONLY): No - copy requested  Reviewed/Updated  Reviewed/Updated: Reviewed All (Medical, Surgical, Family, Medications, Allergies, Care Teams, Patient Goals)    Allergies (verified) Alendronate sodium, Amoxicillin -pot clavulanate, Atorvastatin, Azithromycin , Ezetimibe -simvastatin, Gabapentin, Ibandronate sodium, Lexapro  [escitalopram  oxalate], Lyrica  [pregabalin ], Neosporin [bacitracin-polymyxin b], Risedronate sodium, Rosuvastatin, Sertraline , and Sulfonamide derivatives    Current Medications (verified) Outpatient Encounter Medications as of 11/30/2024  Medication Sig   amiodarone  (PACERONE ) 100 MG tablet Take 1 tablet (  100 mg total) by mouth daily.   apixaban  (ELIQUIS ) 2.5 MG TABS tablet Take 1 tablet by mouth twice daily   Calcium  Carbonate-Vitamin D  600-400 MG-UNIT tablet Take 1 tablet by mouth 2 (two) times daily. Take 1 tablet by mouth two times a day   diclofenac  Sodium (VOLTAREN ) 1 % GEL Apply 2 g topically 4 (four) times daily as needed.   ezetimibe  (ZETIA ) 10 MG tablet TAKE 1 TABLET DAILY   fluticasone  (FLONASE ) 50 MCG/ACT nasal spray Place 2 sprays into both nostrils daily.   Fluticasone  Furoate (ARNUITY ELLIPTA ) 200 MCG/ACT AEPB Take 1 Inhalation by mouth daily. Rinse after use.   furosemide  (LASIX ) 20 MG tablet Take 1 tablet (20 mg total) by mouth daily as needed for fluid.   HYDROcodone -acetaminophen  (NORCO) 7.5-325 MG tablet Take 1 tablet by mouth 2 (two) times daily as needed. Fill on/after 60 days from rx   HYDROcodone -acetaminophen  (NORCO) 7.5-325 MG tablet Take 1 tablet by mouth 2 (two) times daily as needed. Fill on/after 30 days from rx   HYDROcodone -acetaminophen  (NORCO) 7.5-325 MG tablet Take 1 tablet by mouth 2 (two) times daily as needed.   lansoprazole  (PREVACID ) 15 MG capsule TAKE 1 CAPSULE BY MOUTH TWICE DAILY BEFORE A MEAL   losartan  (COZAAR ) 25 MG tablet Take 1 tablet (25 mg total) by mouth daily. To replacement prev rx   metoprolol  succinate (TOPROL -XL) 100 MG 24 hr tablet TAKE 1 TABLET TWICE DAILY WITH OR IMMEDIATELY FOLLOWING A MEAL   mirtazapine  (REMERON ) 7.5 MG tablet Take 1 tablet (7.5 mg total) by mouth at bedtime.   montelukast  (SINGULAIR ) 10 MG tablet Take 1 tablet (10 mg total) by mouth at bedtime.   mupirocin  ointment (BACTROBAN ) 2 % Apply topically daily.   ondansetron  (ZOFRAN ) 4 MG tablet Take 1 tablet (4 mg total) by mouth every 8 (eight) hours as needed for nausea or vomiting.   No facility-administered encounter  medications on file as of 11/30/2024.    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 Minor luminal irregularities with no evidence of obstructive disease.   cataract surgery     GALLBLADDER SURGERY  1997   RECTOCELE REPAIR  2004   TOTAL ABDOMINAL HYSTERECTOMY     Family History  Problem Relation Age of Onset   Hypertension Mother    Breast cancer Mother 71   Hypertension Father    Aneurysm Father        AAA   Cancer Sister        colon   Colon cancer  Sister    Cancer Daughter        thyroid    Anxiety disorder Daughter    Breast cancer Daughter 8   Breast cancer Daughter    Social History   Occupational History   Occupation: Retired    Associate Professor: retired    Comment: Futures Trader  Tobacco Use   Smoking status: Never   Smokeless tobacco: Never  Vaping Use   Vaping status: Never Used  Substance and Sexual Activity   Alcohol use: No    Alcohol/week: 0.0 standard drinks of alcohol   Drug use: No   Sexual activity: Not Currently    Birth control/protection: Post-menopausal   Tobacco Counseling Counseling given: Not Answered  SDOH Screenings   Food Insecurity: No Food Insecurity (11/30/2024)  Housing: Low Risk (11/30/2024)   Transportation Needs: No Transportation Needs (11/30/2024)  Utilities: Not At Risk (11/30/2024)  Alcohol Screen: Low Risk (11/28/2023)  Depression (PHQ2-9): Low Risk (11/30/2024)  Financial Resource Strain: Low Risk (11/28/2023)  Physical Activity: Inactive (11/30/2024)  Social Connections: Socially Isolated (11/30/2024)  Stress: No Stress Concern Present (11/30/2024)  Tobacco Use: Low Risk (06/16/2024)  Health Literacy: Adequate Health Literacy (11/30/2024)   See flowsheets for full screening details  Depression Screen PHQ 2 & 9 Depression Scale- Over the past 2 weeks, how often have you been bothered by any of the following problems? Little interest or pleasure in doing things: 0 Feeling down, depressed, or hopeless (PHQ Adolescent also includes...irritable): 0 PHQ-2 Total Score: 0     Goals Addressed   None          Objective:    There were no vitals filed for this visit. There is no height or weight on file to calculate BMI.  Hearing/Vision screen No results found. Immunizations and Health Maintenance Health Maintenance  Topic Date Due   Zoster Vaccines- Shingrix (1 of 2) Never done   Mammogram  09/26/2021   Influenza Vaccine  07/17/2024   Medicare Annual Wellness (AWV)  11/27/2024   DTaP/Tdap/Td (3 - Tdap) 06/06/2033   Pneumococcal Vaccine: 50+ Years  Completed   Bone Density Scan  Completed   Meningococcal B Vaccine  Aged Out   COVID-19 Vaccine  Discontinued        Assessment/Plan:  This is a routine wellness examination for [place].  Patient Care Team: Cleatus Arlyss RAMAN, MD as PCP - General (Family Medicine) Darron Deatrice LABOR, MD as PCP - Cardiology (Cardiology) Fate Morna SAILOR, [facility] (Inactive) as Pharmacist (Pharmacist) Pa, [facility])  I have personally reviewed and noted the following in the patients chart:   Medical and social history Use of alcohol, tobacco or illicit drugs  Current medications and supplements including  opioid prescriptions. Functional ability and status Nutritional status Physical activity Advanced directives List of other physicians Hospitalizations, surgeries, and ER visits in previous 12 months Vitals Screenings to include cognitive, depression, and falls Referrals and appointments  No orders of the defined types were placed in this encounter.  In addition, I have reviewed and discussed with patient certain preventive protocols, quality metrics, and best practice recommendations. A written personalized care plan for preventive services as well as general preventive health recommendations were provided to patient.   Arnette SAILOR Hoots, CMA   11/30/2024   No follow-ups on file.  After Visit Summary: (Declined) Due to this being a telephonic visit, with patients personalized plan was offered to patient but patient Declined AVS at this time   Nurse Notes: Patient is hardly eating.  Daughter states that she will eat pudding and bran cereal. She does try to get her to do some form of exercise daily. She is still doing her own laundry but that's it around the house.

## 2024-11-30 NOTE — Patient Instructions (Signed)
 Jillian Oconnell,  Thank you for taking the time for your Medicare Wellness Visit. I appreciate your continued commitment to your health goals. Please review the care plan we discussed, and feel free to reach out if I can assist you further.  Please note that Annual Wellness Visits do not include a physical exam. Some assessments may be limited, especially if the visit was conducted virtually. If needed, we may recommend an in-person follow-up with your provider.  Ongoing Care Seeing your primary care provider every 3 to 6 months helps us  monitor your health and provide consistent, personalized care.   Referrals If a referral was made during today's visit and you haven't received any updates within two weeks, please contact the referred provider directly to check on the status.  Recommended Screenings:  Health Maintenance  Topic Date Due   Zoster (Shingles) Vaccine (1 of 2) Never done   Breast Cancer Screening  09/26/2021   Flu Shot  07/17/2024   Medicare Annual Wellness Visit  11/27/2024   DTaP/Tdap/Td vaccine (3 - Tdap) 06/06/2033   Pneumococcal Vaccine for age over 25  Completed   Osteoporosis screening with Bone Density Scan  Completed   Meningitis B Vaccine  Aged Out   COVID-19 Vaccine  Discontinued       11/30/2024    3:03 PM  Advanced Directives  Does Patient Have a Medical Advance Directive? Yes  Type of Advance Directive Out of facility DNR (pink MOST or yellow form)  Does patient want to make changes to medical advance directive? No - Patient declined    Vision: Annual vision screenings are recommended for early detection of glaucoma, cataracts, and diabetic retinopathy. These exams can also reveal signs of chronic conditions such as diabetes and high blood pressure.  Dental: Annual dental screenings help detect early signs of oral cancer, gum disease, and other conditions linked to overall health, including heart disease and diabetes.  Please see the attached documents for  additional preventive care recommendations.

## 2024-12-01 ENCOUNTER — Telehealth: Payer: Self-pay | Admitting: Family Medicine

## 2024-12-01 NOTE — Telephone Encounter (Signed)
 Please call daughter and offer OV re: dec appetite.  Thanks.

## 2024-12-04 NOTE — Telephone Encounter (Signed)
Called and schedule pt

## 2024-12-08 NOTE — Telephone Encounter (Signed)
 Noted. Thanks.

## 2024-12-16 ENCOUNTER — Other Ambulatory Visit: Payer: Self-pay | Admitting: Family Medicine

## 2024-12-21 ENCOUNTER — Ambulatory Visit: Payer: Medicare (Managed Care) | Admitting: Family Medicine

## 2024-12-21 ENCOUNTER — Encounter: Payer: Self-pay | Admitting: Family Medicine

## 2024-12-21 VITALS — BP 110/80 | HR 60 | Temp 98.0°F | Ht <= 58 in | Wt 104.2 lb

## 2024-12-21 DIAGNOSIS — Z23 Encounter for immunization: Secondary | ICD-10-CM

## 2024-12-21 DIAGNOSIS — R634 Abnormal weight loss: Secondary | ICD-10-CM | POA: Diagnosis not present

## 2024-12-21 MED ORDER — HYDROCODONE-ACETAMINOPHEN 7.5-325 MG PO TABS: 1.0000 | ORAL_TABLET | Freq: Two times a day (BID) | ORAL | 0 refills | Status: AC | PRN

## 2024-12-21 NOTE — Progress Notes (Signed)
 Lower appetite.  Weight lower.  111 lbs 6 months ago, down to 104 lbs today.   Taste is affected and that affects her appetite.  No help with mirtazapine .  Still on pain medication at baseline, with some relief. She is taking amiodarone  and eliquis . Discussed diet interventions to try to inc protein/calorie intake.   Meds, vitals, and allergies reviewed.   ROS: Per HPI unless specifically indicated in ROS section   Nad Ncat Neck supple, no LA Breathy voice w/o stridor at baseline.  Rrr Ctab Abd soft not ttp Skin well perfused.

## 2024-12-21 NOTE — Patient Instructions (Signed)
 Let me see about options to try to help your appetite.  We'll be in touch.   Take care.  Glad to see you.

## 2024-12-28 ENCOUNTER — Telehealth: Payer: Self-pay | Admitting: Family Medicine

## 2024-12-28 ENCOUNTER — Other Ambulatory Visit: Payer: Self-pay | Admitting: Family Medicine

## 2024-12-28 DIAGNOSIS — I471 Supraventricular tachycardia, unspecified: Secondary | ICD-10-CM

## 2024-12-28 DIAGNOSIS — R634 Abnormal weight loss: Secondary | ICD-10-CM | POA: Insufficient documentation

## 2024-12-28 MED ORDER — VENLAFAXINE HCL ER 37.5 MG PO CP24: 37.5000 mg | ORAL_CAPSULE | Freq: Every day | ORAL | 1 refills | Status: AC

## 2024-12-28 MED ORDER — VENLAFAXINE HCL ER 37.5 MG PO CP24: 37.5000 mg | ORAL_CAPSULE | Freq: Every day | ORAL | Status: DC

## 2024-12-28 NOTE — Telephone Encounter (Signed)
 Would stop mirtazapine  for 1 week then start venlafaxine .  Would take venlafaxine  in the AMs. Please let me know how that goes, about 10 days after starting venlafaxine , sooner if needed.  Thanks.

## 2024-12-28 NOTE — Telephone Encounter (Signed)
 Please update patient.  One option would be to try a low dose of venlafaxine  after stopping mirtazapine .  If she wants to give this a try, to see if it helps with her appetite, then let me know and I will work on the prescription.  Thanks.

## 2024-12-28 NOTE — Assessment & Plan Note (Signed)
 Her dad been has not been helpful.  I need to see about options the meantime.  See following phone note.

## 2024-12-28 NOTE — Addendum Note (Signed)
 Addended by: CLEATUS LORELI RAMAN on: 12/28/2024 10:25 PM   Modules accepted: Orders

## 2024-12-28 NOTE — Telephone Encounter (Signed)
 E-scribed refill.   Pls schedule annual exam and fasting labs for additional refills.

## 2024-12-28 NOTE — Telephone Encounter (Signed)
 Called and spoke with Orie, daughter on [place].  Asked to speak with pt, Orie reported that she does not hear well over the phone.  Dr. Elfredia message read to Orie she verbalizes understanding and is willing to make the switch to venlafaxine .  Would need to know how long to stop the mirtazapine  before starting the new medication.  Send to [facility] on [address].

## 2024-12-29 NOTE — Telephone Encounter (Signed)
 Called and spoke with Orie, daughter on [place] Dr. Elfredia message read to Orie she verbalizes understanding .

## 2024-12-29 NOTE — Telephone Encounter (Signed)
 I called and pt daughter states she would call back.

## 2024-12-30 ENCOUNTER — Encounter: Payer: Self-pay | Admitting: Family Medicine

## 2024-12-31 ENCOUNTER — Encounter: Payer: Self-pay | Admitting: Family Medicine

## 2025-01-05 ENCOUNTER — Ambulatory Visit: Payer: Medicare (Managed Care) | Admitting: Cardiovascular Disease

## 2025-01-06 ENCOUNTER — Ambulatory Visit: Payer: Medicare (Managed Care) | Admitting: Cardiovascular Disease

## 2025-01-18 ENCOUNTER — Telehealth: Payer: Self-pay | Admitting: Family Medicine

## 2025-01-20 NOTE — Telephone Encounter (Signed)
Noted.  Thanks.  Glad to hear.  ? ?

## 2025-04-07 ENCOUNTER — Ambulatory Visit: Payer: Medicare (Managed Care) | Admitting: Cardiovascular Disease

## 2025-12-01 ENCOUNTER — Ambulatory Visit: Payer: Medicare (Managed Care)
